# Patient Record
Sex: Male | Born: 1935
Health system: Southern US, Community
[De-identification: ages and names within clinical notes are randomized; demographics above are authoritative.]

## PROBLEM LIST (undated history)

## (undated) DIAGNOSIS — N400 Enlarged prostate without lower urinary tract symptoms: Secondary | ICD-10-CM

## (undated) DIAGNOSIS — K654 Sclerosing mesenteritis: Secondary | ICD-10-CM

## (undated) DIAGNOSIS — E860 Dehydration: Secondary | ICD-10-CM

## (undated) DIAGNOSIS — H33319 Horseshoe tear of retina without detachment, unspecified eye: Secondary | ICD-10-CM

## (undated) DIAGNOSIS — K5790 Diverticulosis of intestine, part unspecified, without perforation or abscess without bleeding: Secondary | ICD-10-CM

## (undated) DIAGNOSIS — I2510 Atherosclerotic heart disease of native coronary artery without angina pectoris: Secondary | ICD-10-CM

## (undated) DIAGNOSIS — I10 Essential (primary) hypertension: Secondary | ICD-10-CM

## (undated) DIAGNOSIS — R413 Other amnesia: Secondary | ICD-10-CM

## (undated) DIAGNOSIS — I252 Old myocardial infarction: Secondary | ICD-10-CM

## (undated) DIAGNOSIS — H9190 Unspecified hearing loss, unspecified ear: Secondary | ICD-10-CM

## (undated) DIAGNOSIS — E785 Hyperlipidemia, unspecified: Secondary | ICD-10-CM

## (undated) DIAGNOSIS — I350 Nonrheumatic aortic (valve) stenosis: Secondary | ICD-10-CM

## (undated) DIAGNOSIS — R7301 Impaired fasting glucose: Secondary | ICD-10-CM

## (undated) HISTORY — DX: Essential (primary) hypertension: I10

## (undated) HISTORY — DX: Sclerosing mesenteritis: K65.4

## (undated) HISTORY — DX: Hyperlipidemia, unspecified: E78.5

## (undated) HISTORY — DX: Unspecified hearing loss, unspecified ear: H91.90

## (undated) HISTORY — DX: Dehydration: E86.0

## (undated) HISTORY — PX: CATARACT EXTRACTION, BILATERAL: SHX1313

## (undated) HISTORY — DX: Diverticulosis of intestine, part unspecified, without perforation or abscess without bleeding: K57.90

## (undated) HISTORY — DX: Old myocardial infarction: I25.2

## (undated) HISTORY — DX: Nonrheumatic aortic (valve) stenosis: I35.0

## (undated) HISTORY — PX: OTHER SURGICAL HISTORY: SHX169

## (undated) HISTORY — DX: Horseshoe tear of retina without detachment, unspecified eye: H33.319

## (undated) HISTORY — DX: Other amnesia: R41.3

## (undated) HISTORY — PX: AORTIC VALVE REPLACEMENT: SHX41

## (undated) HISTORY — PX: TONSILLECTOMY: SUR1361

## (undated) HISTORY — DX: Benign prostatic hyperplasia without lower urinary tract symptoms: N40.0

## (undated) HISTORY — DX: Impaired fasting glucose: R73.01

## (undated) HISTORY — DX: Atherosclerotic heart disease of native coronary artery without angina pectoris: I25.10

---

## 2000-11-30 ENCOUNTER — Ambulatory Visit (HOSPITAL_COMMUNITY): Admission: RE | Admit: 2000-11-30 | Discharge: 2000-11-30 | Payer: Self-pay | Admitting: Internal Medicine

## 2000-12-10 ENCOUNTER — Ambulatory Visit (HOSPITAL_COMMUNITY): Admission: RE | Admit: 2000-12-10 | Discharge: 2000-12-10 | Payer: Self-pay | Admitting: Gastroenterology

## 2004-11-20 ENCOUNTER — Encounter: Admission: RE | Admit: 2004-11-20 | Discharge: 2004-11-20 | Payer: Self-pay | Admitting: Internal Medicine

## 2005-01-27 ENCOUNTER — Encounter: Admission: RE | Admit: 2005-01-27 | Discharge: 2005-01-27 | Payer: Self-pay | Admitting: Internal Medicine

## 2005-11-07 ENCOUNTER — Encounter: Admission: RE | Admit: 2005-11-07 | Discharge: 2005-11-07 | Payer: Self-pay | Admitting: Internal Medicine

## 2008-01-21 DIAGNOSIS — I2510 Atherosclerotic heart disease of native coronary artery without angina pectoris: Secondary | ICD-10-CM

## 2008-01-21 DIAGNOSIS — I350 Nonrheumatic aortic (valve) stenosis: Secondary | ICD-10-CM

## 2008-01-21 HISTORY — DX: Atherosclerotic heart disease of native coronary artery without angina pectoris: I25.10

## 2008-01-21 HISTORY — DX: Nonrheumatic aortic (valve) stenosis: I35.0

## 2008-03-27 ENCOUNTER — Inpatient Hospital Stay (HOSPITAL_COMMUNITY): Admission: EM | Admit: 2008-03-27 | Discharge: 2008-03-28 | Payer: Self-pay | Admitting: Emergency Medicine

## 2008-03-28 ENCOUNTER — Encounter: Payer: Self-pay | Admitting: Surgery

## 2008-03-28 ENCOUNTER — Encounter (INDEPENDENT_AMBULATORY_CARE_PROVIDER_SITE_OTHER): Payer: Self-pay | Admitting: Interventional Cardiology

## 2008-03-28 ENCOUNTER — Ambulatory Visit: Payer: Self-pay | Admitting: Surgery

## 2008-04-04 ENCOUNTER — Ambulatory Visit: Payer: Self-pay | Admitting: Surgery

## 2008-04-06 ENCOUNTER — Inpatient Hospital Stay (HOSPITAL_BASED_OUTPATIENT_CLINIC_OR_DEPARTMENT_OTHER): Admission: RE | Admit: 2008-04-06 | Discharge: 2008-04-06 | Payer: Self-pay | Admitting: Interventional Cardiology

## 2008-04-12 ENCOUNTER — Encounter: Payer: Self-pay | Admitting: Surgery

## 2008-04-12 ENCOUNTER — Inpatient Hospital Stay (HOSPITAL_COMMUNITY): Admission: RE | Admit: 2008-04-12 | Discharge: 2008-04-18 | Payer: Self-pay | Admitting: Surgery

## 2008-04-12 ENCOUNTER — Ambulatory Visit: Payer: Self-pay | Admitting: Surgery

## 2008-05-12 ENCOUNTER — Ambulatory Visit: Payer: Self-pay | Admitting: Surgery

## 2008-05-12 ENCOUNTER — Encounter: Admission: RE | Admit: 2008-05-12 | Discharge: 2008-05-12 | Payer: Self-pay | Admitting: Surgery

## 2008-05-23 ENCOUNTER — Encounter: Admission: RE | Admit: 2008-05-23 | Discharge: 2008-05-23 | Payer: Self-pay | Admitting: Surgery

## 2008-05-23 ENCOUNTER — Ambulatory Visit: Payer: Self-pay | Admitting: Surgery

## 2008-06-12 ENCOUNTER — Encounter (HOSPITAL_COMMUNITY): Admission: RE | Admit: 2008-06-12 | Discharge: 2008-08-20 | Payer: Self-pay | Admitting: Interventional Cardiology

## 2008-06-20 ENCOUNTER — Encounter: Admission: RE | Admit: 2008-06-20 | Discharge: 2008-06-20 | Payer: Self-pay | Admitting: Surgery

## 2008-06-20 ENCOUNTER — Ambulatory Visit: Payer: Self-pay | Admitting: Surgery

## 2010-02-10 ENCOUNTER — Encounter: Payer: Self-pay | Admitting: Surgery

## 2010-05-02 LAB — APTT
aPTT: 30 seconds (ref 24–37)
aPTT: 44 seconds — ABNORMAL HIGH (ref 24–37)

## 2010-05-02 LAB — HEMOGLOBIN A1C
Hgb A1c MFr Bld: 5.4 % (ref 4.6–6.1)
Mean Plasma Glucose: 108 mg/dL

## 2010-05-02 LAB — PREPARE PLATELETS

## 2010-05-02 LAB — POCT I-STAT 3, ART BLOOD GAS (G3+)
Acid-Base Excess: 1 mmol/L (ref 0.0–2.0)
Acid-base deficit: 4 mmol/L — ABNORMAL HIGH (ref 0.0–2.0)
Acid-base deficit: 4 mmol/L — ABNORMAL HIGH (ref 0.0–2.0)
Bicarbonate: 20.2 mEq/L (ref 20.0–24.0)
Bicarbonate: 20.9 mEq/L (ref 20.0–24.0)
Bicarbonate: 23.6 mEq/L (ref 20.0–24.0)
Bicarbonate: 24.6 mEq/L — ABNORMAL HIGH (ref 20.0–24.0)
O2 Saturation: 100 %
O2 Saturation: 96 %
O2 Saturation: 98 %
Patient temperature: 36.2
TCO2: 25 mmol/L (ref 0–100)
TCO2: 26 mmol/L (ref 0–100)
TCO2: 26 mmol/L (ref 0–100)
pCO2 arterial: 33 mmHg — ABNORMAL LOW (ref 35.0–45.0)
pCO2 arterial: 34.1 mmHg — ABNORMAL LOW (ref 35.0–45.0)
pCO2 arterial: 36.6 mmHg (ref 35.0–45.0)
pCO2 arterial: 39.8 mmHg (ref 35.0–45.0)
pCO2 arterial: 40.3 mmHg (ref 35.0–45.0)
pH, Arterial: 7.315 — ABNORMAL LOW (ref 7.350–7.450)
pH, Arterial: 7.368 (ref 7.350–7.450)
pH, Arterial: 7.381 (ref 7.350–7.450)
pH, Arterial: 7.398 (ref 7.350–7.450)
pH, Arterial: 7.48 — ABNORMAL HIGH (ref 7.350–7.450)
pO2, Arterial: 406 mmHg — ABNORMAL HIGH (ref 80.0–100.0)
pO2, Arterial: 79 mmHg — ABNORMAL LOW (ref 80.0–100.0)
pO2, Arterial: 84 mmHg (ref 80.0–100.0)
pO2, Arterial: 96 mmHg (ref 80.0–100.0)

## 2010-05-02 LAB — CBC
HCT: 24.1 % — ABNORMAL LOW (ref 39.0–52.0)
HCT: 27.1 % — ABNORMAL LOW (ref 39.0–52.0)
HCT: 30.6 % — ABNORMAL LOW (ref 39.0–52.0)
HCT: 38.4 % — ABNORMAL LOW (ref 39.0–52.0)
HCT: 40 % (ref 39.0–52.0)
Hemoglobin: 10.5 g/dL — ABNORMAL LOW (ref 13.0–17.0)
Hemoglobin: 13.5 g/dL (ref 13.0–17.0)
Hemoglobin: 8.5 g/dL — ABNORMAL LOW (ref 13.0–17.0)
Hemoglobin: 9.6 g/dL — ABNORMAL LOW (ref 13.0–17.0)
Hemoglobin: 9.7 g/dL — ABNORMAL LOW (ref 13.0–17.0)
MCHC: 34.3 g/dL (ref 30.0–36.0)
MCHC: 35 g/dL (ref 30.0–36.0)
MCHC: 35.2 g/dL (ref 30.0–36.0)
MCHC: 35.6 g/dL (ref 30.0–36.0)
MCV: 94.1 fL (ref 78.0–100.0)
MCV: 95 fL (ref 78.0–100.0)
MCV: 95.2 fL (ref 78.0–100.0)
Platelets: 120 10*3/uL — ABNORMAL LOW (ref 150–400)
Platelets: 180 10*3/uL (ref 150–400)
Platelets: 202 10*3/uL (ref 150–400)
RBC: 2.86 MIL/uL — ABNORMAL LOW (ref 4.22–5.81)
RBC: 2.88 MIL/uL — ABNORMAL LOW (ref 4.22–5.81)
RBC: 4.04 MIL/uL — ABNORMAL LOW (ref 4.22–5.81)
RDW: 13 % (ref 11.5–15.5)
RDW: 13.2 % (ref 11.5–15.5)
RDW: 12.8 % (ref 11.5–15.5)
WBC: 10.5 10*3/uL (ref 4.0–10.5)
WBC: 13.8 10*3/uL — ABNORMAL HIGH (ref 4.0–10.5)
WBC: 7.5 10*3/uL (ref 4.0–10.5)
WBC: 7.8 10*3/uL (ref 4.0–10.5)

## 2010-05-02 LAB — BASIC METABOLIC PANEL
BUN: 18 mg/dL (ref 6–23)
BUN: 16 mg/dL (ref 6–23)
CO2: 25 mEq/L (ref 19–32)
CO2: 26 mEq/L (ref 19–32)
CO2: 24 mEq/L (ref 19–32)
CO2: 26 mEq/L (ref 19–32)
Calcium: 8.4 mg/dL (ref 8.4–10.5)
Calcium: 8.3 mg/dL — ABNORMAL LOW (ref 8.4–10.5)
Chloride: 110 mEq/L (ref 96–112)
Chloride: 112 mEq/L (ref 96–112)
Chloride: 113 mEq/L — ABNORMAL HIGH (ref 96–112)
Chloride: 107 mEq/L (ref 96–112)
Chloride: 109 mEq/L (ref 96–112)
Creatinine, Ser: 1 mg/dL (ref 0.4–1.5)
GFR calc Af Amer: >60 mL/min (ref >60)
GFR calc Af Amer: >60 mL/min (ref >60)
GFR calc Af Amer: >60 mL/min (ref >60)
GFR calc non Af Amer: >60 mL/min (ref >60)
Glucose, Bld: 130 mg/dL — ABNORMAL HIGH (ref 70–99)
Glucose, Bld: 158 mg/dL — ABNORMAL HIGH (ref 70–99)
Glucose, Bld: 109 mg/dL — ABNORMAL HIGH (ref 70–99)
Potassium: 3.4 mEq/L — ABNORMAL LOW (ref 3.5–5.1)
Potassium: 4 mEq/L (ref 3.5–5.1)
Potassium: 3.7 mEq/L (ref 3.5–5.1)
Sodium: 138 mEq/L (ref 135–145)
Sodium: 141 mEq/L (ref 135–145)
Sodium: 141 mEq/L (ref 135–145)
Sodium: 141 mEq/L (ref 135–145)

## 2010-05-02 LAB — COMPREHENSIVE METABOLIC PANEL
ALT: 15 U/L (ref 0–53)
AST: 15 U/L (ref 0–37)
Albumin: 3.5 g/dL (ref 3.5–5.2)
Alkaline Phosphatase: 53 U/L (ref 39–117)
BUN: 16 mg/dL (ref 6–23)
CO2: 21 mEq/L (ref 19–32)
Calcium: 9.2 mg/dL (ref 8.4–10.5)
Chloride: 110 mEq/L (ref 96–112)
Creatinine, Ser: 0.82 mg/dL (ref 0.4–1.5)
GFR calc Af Amer: >60 mL/min (ref >60)
GFR calc non Af Amer: >60 mL/min (ref >60)
Glucose, Bld: 82 mg/dL (ref 70–99)
Potassium: 4.4 mEq/L (ref 3.5–5.1)
Sodium: 139 mEq/L (ref 135–145)
Total Bilirubin: 0.8 mg/dL (ref 0.3–1.2)
Total Protein: 5.8 g/dL — ABNORMAL LOW (ref 6.0–8.3)

## 2010-05-02 LAB — DIFFERENTIAL
Lymphocytes Relative: 18 % (ref 12–46)
Lymphs Abs: 1.5 10*3/uL (ref 0.7–4.0)
Monocytes Absolute: 0.6 10*3/uL (ref 0.1–1.0)
Monocytes Relative: 7 % (ref 3–12)
Neutro Abs: 6.1 10*3/uL (ref 1.7–7.7)

## 2010-05-02 LAB — CARDIAC PANEL(CRET KIN+CKTOT+MB+TROPI)
Relative Index: INVALID (ref 0.0–2.5)
Relative Index: INVALID (ref 0.0–2.5)
Total CK: 125 U/L (ref 7–232)
Total CK: 81 U/L (ref 7–232)

## 2010-05-02 LAB — GLUCOSE, CAPILLARY
Glucose-Capillary: 146 mg/dL — ABNORMAL HIGH (ref 70–99)
Glucose-Capillary: 42 mg/dL — ABNORMAL LOW (ref 70–99)
Glucose-Capillary: 53 mg/dL — ABNORMAL LOW (ref 70–99)
Glucose-Capillary: 61 mg/dL — ABNORMAL LOW (ref 70–99)
Glucose-Capillary: 69 mg/dL — ABNORMAL LOW (ref 70–99)
Glucose-Capillary: 97 mg/dL (ref 70–99)
Glucose-Capillary: 107 mg/dL — ABNORMAL HIGH (ref 70–99)

## 2010-05-02 LAB — POCT I-STAT 4, (NA,K, GLUC, HGB,HCT)
Glucose, Bld: 104 mg/dL — ABNORMAL HIGH (ref 70–99)
Glucose, Bld: 106 mg/dL — ABNORMAL HIGH (ref 70–99)
Glucose, Bld: 111 mg/dL — ABNORMAL HIGH (ref 70–99)
Glucose, Bld: 120 mg/dL — ABNORMAL HIGH (ref 70–99)
Glucose, Bld: 88 mg/dL (ref 70–99)
HCT: 25 % — ABNORMAL LOW (ref 39.0–52.0)
HCT: 25 % — ABNORMAL LOW (ref 39.0–52.0)
HCT: 25 % — ABNORMAL LOW (ref 39.0–52.0)
HCT: 25 % — ABNORMAL LOW (ref 39.0–52.0)
HCT: 25 % — ABNORMAL LOW (ref 39.0–52.0)
HCT: 27 % — ABNORMAL LOW (ref 39.0–52.0)
HCT: 29 % — ABNORMAL LOW (ref 39.0–52.0)
HCT: 34 % — ABNORMAL LOW (ref 39.0–52.0)
Hemoglobin: 11.6 g/dL — ABNORMAL LOW (ref 13.0–17.0)
Hemoglobin: 8.5 g/dL — ABNORMAL LOW (ref 13.0–17.0)
Hemoglobin: 8.5 g/dL — ABNORMAL LOW (ref 13.0–17.0)
Hemoglobin: 8.5 g/dL — ABNORMAL LOW (ref 13.0–17.0)
Hemoglobin: 9.2 g/dL — ABNORMAL LOW (ref 13.0–17.0)
Hemoglobin: 9.9 g/dL — ABNORMAL LOW (ref 13.0–17.0)
Potassium: 3.7 mEq/L (ref 3.5–5.1)
Potassium: 3.9 mEq/L (ref 3.5–5.1)
Potassium: 4.7 mEq/L (ref 3.5–5.1)
Potassium: 5 mEq/L (ref 3.5–5.1)
Potassium: 5.2 mEq/L — ABNORMAL HIGH (ref 3.5–5.1)
Sodium: 135 mEq/L (ref 135–145)
Sodium: 139 mEq/L (ref 135–145)
Sodium: 142 mEq/L (ref 135–145)

## 2010-05-02 LAB — POCT I-STAT 3, VENOUS BLOOD GAS (G3P V)
Acid-base deficit: 1 mmol/L (ref 0.0–2.0)
Acid-base deficit: 4 mmol/L — ABNORMAL HIGH (ref 0.0–2.0)
Bicarbonate: 22.1 mEq/L (ref 20.0–24.0)
Bicarbonate: 22.3 mEq/L (ref 20.0–24.0)
Bicarbonate: 23.9 mEq/L (ref 20.0–24.0)
O2 Saturation: 63 %
O2 Saturation: 94 %
TCO2: 23 mmol/L (ref 0–100)
TCO2: 25 mmol/L (ref 0–100)
pCO2, Ven: 39.2 mmHg — ABNORMAL LOW (ref 45.0–50.0)
pCO2, Ven: 40.5 mmHg — ABNORMAL LOW (ref 45.0–50.0)
pCO2, Ven: 44.6 mmHg — ABNORMAL LOW (ref 45.0–50.0)
pH, Ven: 7.378 — ABNORMAL HIGH (ref 7.250–7.300)
pO2, Ven: 33 mmHg (ref 30.0–45.0)
pO2, Ven: 74 mmHg — ABNORMAL HIGH (ref 30.0–45.0)
pO2, Ven: 84 mmHg — ABNORMAL HIGH (ref 30.0–45.0)

## 2010-05-02 LAB — URINALYSIS, ROUTINE W REFLEX MICROSCOPIC
Bilirubin Urine: NEGATIVE
Glucose, UA: NEGATIVE mg/dL
Glucose, UA: NEGATIVE mg/dL
Hgb urine dipstick: NEGATIVE
Ketones, ur: NEGATIVE mg/dL
Nitrite: NEGATIVE
Protein, ur: NEGATIVE mg/dL
Protein, ur: 30 mg/dL — AB
Specific Gravity, Urine: 1.023 (ref 1.005–1.030)
Urobilinogen, UA: 1 mg/dL (ref 0.0–1.0)
pH: 6.5 (ref 5.0–8.0)

## 2010-05-02 LAB — CK TOTAL AND CKMB (NOT AT ARMC)
Relative Index: INVALID (ref 0.0–2.5)
Total CK: 86 U/L (ref 7–232)

## 2010-05-02 LAB — POCT I-STAT, CHEM 8
Creatinine, Ser: 1 mg/dL (ref 0.4–1.5)
HCT: 25 % — ABNORMAL LOW (ref 39.0–52.0)
Hemoglobin: 8.5 g/dL — ABNORMAL LOW (ref 13.0–17.0)
Sodium: 141 mEq/L (ref 135–145)
TCO2: 21 mmol/L (ref 0–100)

## 2010-05-02 LAB — URINE CULTURE: Culture: NO GROWTH

## 2010-05-02 LAB — BLOOD GAS, ARTERIAL
Acid-base deficit: 0.5 mmol/L (ref 0.0–2.0)
Bicarbonate: 23.4 mEq/L (ref 20.0–24.0)
Drawn by: 206361
FIO2: 0.21 %
O2 Saturation: 98 %
Patient temperature: 98.6
TCO2: 24.5 mmol/L (ref 0–100)
pCO2 arterial: 36.5 mmHg (ref 35.0–45.0)
pH, Arterial: 7.423 (ref 7.350–7.450)
pO2, Arterial: 92.5 mmHg (ref 80.0–100.0)

## 2010-05-02 LAB — URINE MICROSCOPIC-ADD ON

## 2010-05-02 LAB — PROTIME-INR
INR: 1.2 (ref 0.00–1.49)
INR: 2 — ABNORMAL HIGH (ref 0.00–1.49)
Prothrombin Time: 15.7 seconds — ABNORMAL HIGH (ref 11.6–15.2)

## 2010-05-02 LAB — TYPE AND SCREEN
ABO/RH(D): A POS
Antibody Screen: NEGATIVE

## 2010-05-02 LAB — CREATININE, SERUM
Creatinine, Ser: 1.01 mg/dL (ref 0.4–1.5)
GFR calc Af Amer: >60 mL/min (ref >60)
GFR calc non Af Amer: >60 mL/min (ref >60)

## 2010-05-02 LAB — POCT CARDIAC MARKERS
CKMB, poc: <1 ng/mL — ABNORMAL LOW (ref 1.0–8.0)
Troponin i, poc: <0.05 ng/mL (ref 0.00–0.09)

## 2010-05-02 LAB — ABO/RH: ABO/RH(D): A POS

## 2010-05-02 LAB — PREPARE RBC (CROSSMATCH)

## 2010-06-04 NOTE — H&P (Signed)
[name], [name]              ACCOUNT NO.:  [id number]   MEDICAL RECORD NO.:  [id number]          PATIENT TYPE:  INP   LOCATION:  3729                         FACILITY:  [facility]   PHYSICIAN:  Kela Millin, M.D.DATE OF BIRTH:  1926/12/29   DATE OF ADMISSION:  03/27/2008  DATE OF DISCHARGE:                              HISTORY & PHYSICAL   PRIMARY CARE PHYSICIAN:  Dr. Kirby Funk.   CHIEF COMPLAINT:  Syncope.   HISTORY OF PRESENT ILLNESS:  The patient is a 77 year old white male  with past medical history significant for valvular heart disease  followed by Dr. Eldridge Dace and hypertension who presents with the above  complaints.  He states that he had been at the [facility] and exercised for  about an hour, and then decided that he was going to walk over to a  garage which was about 2 miles away.  He had just left the [facility] and was  walking for about 5 minutes when he began feeling dizzy, and then put  the bag down that he was carrying with him and the next thing he knew he  was waking up on the ground.  He had passed out, and it is unclear  exactly for how long he was unconscious, but he suggests a few minutes.  He reports that one of the motorists saw him after he fell to the ground  and came to his aid, and a police also came by and when he came to they  were checking his pulse and they mentioned that his blood pressure was  in the 150s systolic at the scene.  His wife reports and the patient  confirms that he was incontinent of urine.  No reports of jerking or  confusion following the episode.  The patient reports that he had not  had enough water to drink after his exercise.  He denies chest pain,  palpitations, headaches, focal weakness, blurred vision, diarrhea,  melena, nausea or vomiting and no hematochezia.  He does not know  exactly which of his valves is diseased, but admits that he also has a  longstanding murmur.   He was seen in the ED, and a chest x-ray was done which  did not show any  significant findings.  His hemoglobin of 14.2 with a hematocrit of 40.  Point of care markers negative x1.  BUN of 16 with a creatinine of 1.03,  his glucose 107.  His EKG showed normal sinus rhythm at a rate of 79  with no acute ischemic changes.  He is admitted for further evaluation  and management.   PAST MEDICAL HISTORY:  As above.   MEDICATIONS:  1. Lisinopril.  2. Aspirin 325 mg.  3. Pravastatin.  He does not know the doses of the medications.   ALLERGIES:  NKDA.   SOCIAL HISTORY:  He smokes a pipe.  He denies alcohol.   FAMILY HISTORY:  His mother had mitral valve disease and had some  valvular surgery.   REVIEW OF SYSTEMS:  As per HPI; other review of systems negative.   PHYSICAL EXAMINATION:  GENERAL:  The  patient is an elderly white male.  He is alert and appropriate in no apparent distress.  VITAL SIGNS:  Temperature is 97.5 with a blood pressure of 121/81, pulse  of 81, respiratory rate of 23, O2 sat of 100%.  HEENT:  PERRL, EOMI, sclerae anicteric, slightly dry mucous membranes  and no oral exudates.  NECK:  Supple, no adenopathy, no thyromegaly, and no JVD.  LUNGS:  Clear to auscultation bilaterally.  No crackles or wheezes.  CARDIOVASCULAR:  Regular rate and rhythm.  Normal S1, S2, holosystolic  murmur present.  ABDOMEN:  Soft, bowel sounds present, nontender, nondistended.  No  organomegaly and no masses palpable.  EXTREMITIES:  No cyanosis and no edema.  NEUROLOGICAL:  He is alert and oriented.  Cranial nerves II-XII grossly  intact.  Strength is 5/5 and symmetric.  Sensory is grossly intact.   LABORATORY DATA:  As per HPI.  Also the white cell count is 8.3,  hemoglobin 14.2, hematocrit 40, platelet count 202.  Point of care  markers negative x1.  Sodium is 140, potassium 3.7, chloride 107, CO2 of  24, glucose 109, BUN is 16, creatinine 1.03.  Calcium is 9.4.  Urinalysis is negative for infection.   ASSESSMENT AND PLAN:  1. Syncope -  cardiac versus orthostatic syncope versus neurological.      Will check orthostatic vital signs, obtain serial cardiac enzymes,      monitor on telemetry, serial cardiac enzymes .  Consult cardiology      pending cardiac enzymes.  Will also obtain a 2-D echocardiogram -      as noted above.  He has a history of valvular heart disease with a      longstanding murmur.  Obtain outpatient records.  Will also obtain      a computed tomography scan of his head and carotid Doppler      ultrasound to further evaluate.  Follow and consider an EEG pending      these studies as is noted above. Was incontinent of urine at the      scene, although no other history given consistent with seizure      activity.  2. Hypertension - follow and resume outpatient medications as      clinically appropriate.  3. History of valvular heart disease - as above.      Kela Millin, M.D.  Electronically Signed     ACV/MEDQ  D:  03/28/2008  T:  03/28/2008  Job:  [id number]   cc:   Thora Lance, M.D.  Corky Crafts, MD

## 2010-06-04 NOTE — Consult Note (Signed)
[name], [name]              ACCOUNT NO.:  [id number]   MEDICAL RECORD NO.:  [id number]          PATIENT TYPE:  INP   LOCATION:  3729                         FACILITY:  [facility]   PHYSICIAN:  Corky Crafts, MDDATE OF BIRTH:  March 10, 1926   DATE OF CONSULTATION:  03/28/2008  DATE OF DISCHARGE:  03/28/2008                                 CONSULTATION   REFERRING PHYSICIAN:  Thora Lance, MD   REASON FOR CONSULTATION:  Syncope and aortic stenosis.   HISTORY OF PRESENT ILLNESS:  The patient is a 77 year old man with known  severe aortic stenosis.  He has been followed conservatively as he has  been asymptomatic.  Yesterday, he was walking up a hill after he had  already exercised for about an hour.  He became lightheaded and the next  thing he remembers that people were standing over him and he was lying  on the ground.  He did not hurt himself during the fall.  He did not  have any chest pain.  He does not recall passing out any time recently  before this.  There were no reports of any seizure-type activity.  He  was seen in the emergency department and he was not anemic.  His renal  function was normal.  His blood sugar was 107.  His chest x-ray was  nonacute.   PAST MEDICAL HISTORY:  1. Aortic stenosis.  2. Hypertension.  3. Prior tobacco abuse.   MEDICATIONS:  1. Lisinopril/HCTZ 20/12.5 mg a day.  2. Aspirin 325 mg a day.  3. Pravastatin.   ALLERGIES:  No known drug allergies.   SOCIAL HISTORY:  He does smoke a pipe.  He does not use alcohol.   FAMILY HISTORY:  His mother had mitral valve disease and had a previous  valve surgery.   REVIEW OF SYSTEMS:  Syncope as noted above.  No chest pain.  No  shortness of breath.  No rash.  No bleeding problems.  All other systems  negative.   PHYSICAL EXAMINATION:  VITAL SIGNS:  Blood pressure in the emergency  room was 121/81, heart rate of 81, and oxygen saturation of 100%.  GENERAL:  He is awake and alert, no  apparent distress.  HEAD:  Normocephalic and atraumatic.  NECK:  No JVD.  CARDIOVASCULAR:  Regular rate and rhythm.  S1 and S2.  A 3/6 harsh  systolic murmur.  LUNGS:  Clear to auscultation bilaterally.  ABDOMEN:  Soft, nontender, and nondistended.  EXTREMITIES:  No edema.  NEURO:  No focal deficits.  SKIN:  No rash.  BACK:  Unremarkable.   LABORATORY DATA:  Lab work shows CK of 125, MB of 1.8, and troponin of  0.05 on the most recent check.  Earlier cardiac markers show a CK of 81,  MB of 2.3, and troponin of 0.15.  ECG shows normal sinus rhythm with  evidence of an LVH and associated ST-T wave changes laterally.   MEDICAL DECISION MAKING:  A 81-year with severe aortic stenosis.   PLAN:  1. We will repeat echocardiogram.  It has been about 4-5 months since  his last echo.  I think it is time for him to have his valve      replaced.  I have spoken to Dr. Valentina Lucks as well as Dr. [name]      regarding this.  Ideally, the patient needs to stay in the hospital      and have this catheterization followed by valve replacement.  He      really does not want to stay in the hospital.  He is willing to      come back next week to have the testing, but he does not want to      stay here at this time.  He assures me that he will not do any      exercise or any strenuous activities.  I told him that such      activities would be prohibited because it could lead to more      syncope.  He is agreeable to this.  We will also decrease his      lisinopril/HCTZ to avoid causing any hypotension.  2. Hypertension, well controlled.  We will decrease lisinopril/HCTZ as      outlined above.  3. He is to stop smoking.  4. We will plan for cardiac cath next week followed by surgery.      Corky Crafts, MD  Electronically Signed     JSV/MEDQ  D:  03/28/2008  T:  03/29/2008  Job:  [id number]

## 2010-06-04 NOTE — Op Note (Signed)
[name], [name]              ACCOUNT NO.:  [id number]   MEDICAL RECORD NO.:  [id number]          PATIENT TYPE:  INP   LOCATION:  2310                         FACILITY:  [facility]   PHYSICIAN:  [name], M.D.     DATE OF BIRTH:  04/11/1926   DATE OF PROCEDURE:  04/12/2008  DATE OF DISCHARGE:                               OPERATIVE REPORT   PREOPERATIVE DIAGNOSES:  1. Severe aortic stenosis.  2. Two-vessel coronary artery disease.   POSTOPERATIVE DIAGNOSES:  1. Severe aortic stenosis.  2. Two-vessel coronary artery disease.   OPERATIVE PROCEDURE:  1. Median sternotomy, extracorporeal circulation, coronary artery      bypass graft x2 using a saphenous vein graft to the obtuse marginal      branch and left circumflex coronary artery and a left internal      mammary artery graft to the left anterior descending coronary      artery.  2. Aortic valve replacement using a 23-mm Edwards pericardial valve.  3. Endoscopic vein harvesting from the right leg.   ATTENDING SURGEON:  [name], M.D.   ASSISTANT:  Salvatore Decent. Dorris Fetch, M.D.   SECOND ASSISTANT:  Rowe Clack, PA-C   ANESTHESIA:  General endotracheal.   CLINICAL HISTORY:  This patient is a 77 year old gentleman with a known  history of severe aortic stenosis who has been followed by Dr. Eldridge Dace  for the past few years.  He was asymptomatic until March 27, 2008, when  he had a syncopal episode while walking.  He is ruled out for myocardial  infarction.  An echocardiogram on March 28, 2008, showed severe aortic  stenosis with an aortic valve area of 0.45 sq cm with a peak gradient of  92 and a mean gradient of 52.  He underwent cardiac catheterization,  which showed 2-vessel coronary artery disease.  The left circumflex had  about 70-80% of a large marginal branch.  The LAD had about 50% proximal  stenosis and 80-90% mid vessel stenosis with the distal LAD wrapping  around the apex.  Left ventricular function by  echocardiogram is well  preserved.  The left ventriculogram was not performed.  He had normal  right and left heart pressures.  After review of the catheterization and  echocardiogram and examination of the patient, it was felt that aortic  valve replacement and coronary artery bypass surgery was the best  treatment.  I discussed the operative procedure with the patient and his  wife including alternatives for valve replacement.  I felt a tissue  valve will be his best option, he is 77 year old and he was in agreement  with that.  I discussed benefits and risk of surgery including not  limited to bleeding, blood transfusion, infection, stroke, myocardial  infarction, graft failure, heart block requiring permanent pacemaker.  He understood all of this and agreed to proceed.   OPERATIVE PROCEDURE:  The patient was taken to the operative room and  placed on the table in supine position.  After induction of general  endotracheal anesthesia, a Foley catheter was placed in bladder using  sterile technique.  Preoperative  intravenous antibiotics were given.  Then, the chest, abdomen, and both lower extremities were prepped and  draped in usual sterile manner.  A transesophageal echocardiogram was  performed by Anesthesiology.  This showed severe calcific aortic  stenosis.  There was no significant aortic insufficiency.  There was no  mitral regurgitation.  Left ventricular function appeared well preserved  with moderate left ventricular hypertrophy.  Right ventricular function  was preserved.   Then, the chest was opened through a median sternotomy incision and the  pericardium opened in midline.  Examination of the heart showed good  ventricular contractility.  The ascending aorta had no palpable plaques  in it.   Then, the left internal mammary artery was harvested from the chest wall  as a pedicle graft.  This was a medium caliber vessel with excellent  blood flow through it.  At the same  time, a segment of greater saphenous  vein was harvested from the right leg using endoscopic vein harvest  technique.  This vein was of medium size and fair quality.  The walls  were somewhat thickened.   Then, the patient was heparinized and when an adequate activated  clotting time was achieved, the distal ascending aorta was cannulated  using a 20-French aortic cannula for arterial inflow.  Venous outflow  was achieved using a 2-stage venous cannula through the right atrial  appendage.  An antegrade cardioplegia and vent cannula was inserted in  the aortic root.   A left ventricular vent was placed through the right superior pulmonary  vein, and a retrograde cardioplegic cannula was placed through the right  atrium into the coronary sinus.   The patient was placed on cardiopulmonary bypass and distal coronaries  identified.  The LAD was a large graftable vessel, it was diffusely  disease.  The obtuse marginal branch was a moderate-sized vessel with no  significant distal disease in it.  The posterior descending branch of  the left circumflex was intramyocardial and not visualized.  The right  coronary was a small nondominant vessel on catheterization, was not  visualized.   Then, the aorta was cross-clamped and 1000 mL of cold blood antegrade  cardioplegia was administered in the aortic root with quick arrest of  the heart.  Systemic hypothermia to 20 degrees centigrade and topical  hypothermia with iced saline was used.  Temperature probe was placed in  the septum insulating pad in the pericardium.  Then, the first distal  anastomosis was performed to the obtuse marginal branch.  The internal  diameter was about 1.75 mm.  The conduit used was the same segment of  greater saphenous vein and the anastomosis was performed in an end-to-  side manner using continuous 7-0 Prolene suture.  Flow was noted through  the graft and was excellent.   Second distal anastomosis was performed  to the distal LAD.  The internal  diameter was 1.75-2 mm.  The conduit used was the left internal mammary  graft and is brought through an opening in the left pericardium and  anterior to the phrenic nerve.  It was anastomosed through the LAD in an  end-to-side manner using continuous 8-0 Prolene suture.  The pedicle was  sutured to the epicardium with 6-0 Prolene sutures.  Then, another dose  of cardioplegia was given in the aortic root.   The aortic root was then opened transversely just above the sinotubular  junction.  Examination of the aortic valve showed there were 3 leaflets.  They were very  calcified and completely pliable.  The native valve was  excised.  The annulus was decalcified with rongeurs.  Care was taken to  remove all particulate debris.  The right and left coronary ostia were  identified.  Then, the left ventricle and aortic root irrigated with  iced-saline solution.  While the aortic valve replacement was being  performed, cold blood antegrade cardioplegia was given at 20-minute  intervals to maintain myocardial temperature around 10 degrees  centigrade.  Then, the annulus was sized, and a 23-mm Edwards  pericardial Magna-Ease valve was chosen.  This had model #3300 TSX  serial [id number].  Then, a series of pledgeted 2-0 Ethibond horizontal  mattress sutures were placed around the annulus with pledgets in the  subannular position.  The sutures were then placed to the sewing ring,  and the valve lowered into place.  The sutures were tied sequentially.  The valve seated nicely.  Then, the patient was rewarmed to 37-degrees  centigrade.  The aorta was closed in 2 layers using continuous 4-0  Prolene suture.  The aortotomy was coated with Coseal for hemostasis.  With crossclamp in place, a single proximal vein graft anastomosis was  performed to the aortic root in an end-to-side manner using continuous 6-  0 Prolene suture.  Then, the left side of the heart was de-aired.   The  head was placed in the Trendelenburg position and the clamp was removed  from the mammary pedicle.  There was rapid warming of the ventricular  septum and return of spontaneous ventricular fibrillation.  The cross  clamp was removed with a time of 101 minutes.  The patient was then  defibrillated into sinus rhythm.   The proximal and distal anastomoses appeared hemostatic and allowed the  graft satisfactorily and graft markers were placed around the proximal  anastomosis.  Two temporary right ventricular and right atrial pacing  wires were placed and brought out through the skin.   When the patient rewarmed at the 37-degree centigrade, he was weaned  from cardiopulmonary bypass on low-dose dopamine.  He had moderate  amount of intracardiac air and therefore we slowly weaned him from  cardiopulmonary bypass to rule out this area to be removed through the  vent.  After weaning from bypass, TE showed preserved left ventricular  function with a normal functioning prosthetic aortic valve with no  evidence of perivalvular leak or regurgitation.  There was no mitral  regurgitation.  Then, protamine was given, and the venous and aortic  clamp was removed without difficulty.  Hemostasis was achieved.  The  patient was given 1 unit of platelets since he was coagulopathic from  the start of the procedure.  Then, 3 chest tubes were placed with the  tube in the posterior pericardium, 1 in the right pleural space, and 1  in the anterior mediastinum.  The pericardium is loosely reapproximated  over the heart.  The sternum was closed with #6 stainless steel wires.  The fascia was closed with continuous #1 Vicryl suture.  Subcutaneous  tissue was closed with continuous 2-0 Vicryl, and the skin with 3-0  Vicryl subcuticular skin closure.  The lower extremity vein harvest site  was closed in layers in a similar manner.  The sponge, needle, and  instrument counts were correct according to the scrub  nurse.  Dry,  sterile dressings were applied over the incisions around the chest  tubes, which were hooked to Pleur-Evac suction.  The patient remained  hemodynamically stable and was  transferred to the SICU in guarded but  stable condition.      [name], M.D.  Electronically Signed     BB/MEDQ  D:  04/12/2008  T:  04/13/2008  Job:  [id number]   cc:   Corky Crafts, MD

## 2010-06-04 NOTE — Assessment & Plan Note (Signed)
OFFICE VISIT   [name], [name]  DOB:  Oct 01, 1926                                        June 20, 2008  CHART #:  [id number]   The patient returned to the office today for followup of a left pleural  effusion status post coronary bypass surgery and aortic valve  replacement on April 12, 2008.  He has been feeling well and is in  cardiac rehab.  He has had no shortness of breath or chest pain. His lab  work continues to improve.   PHYSICAL EXAMINATION:  VITAL SIGNS:  Today, blood pressure is 111/64,  pulse is 54 and regular, and respiratory rate is 16, unlabored.  Oxygen  saturation on room air is 98%.  GENERAL:  He looks well.  CARDIAC:  A regular rate and rhythm with normal valve sounds.  LUNGS:  Slight decreased breath sounds at the left base.  CHEST:  Incision well healed and sternum is stable.  EXTREMITIES:  There is no peripheral edema.   Followup chest x-ray today shows a continued decrease in the size of  left pleural effusion.  There is still a small amount of left pleural  effusion present.  Lungs are otherwise clear.   IMPRESSION:  Overall, the patient is recovering very well following his  surgery.  I would expect the small left pleural effusion to resolve on  its own.  I do not think any further intervention is warranted at this  time.  He will continue cardiac rehab and will continue to follow up  with Dr. Eldridge Dace for his cardiac care.   [name], M.D.  Electronically Signed   BB/MEDQ  D:  06/20/2008  T:  06/20/2008  Job:  [id number]

## 2010-06-04 NOTE — Discharge Summary (Signed)
Allen, Bowman              ACCOUNT NO.:  [id number]   MEDICAL RECORD NO.:  [id number]          PATIENT TYPE:  INP   LOCATION:  3729                         FACILITY:  [facility]   PHYSICIAN:  Thora Lance, M.D.  DATE OF BIRTH:  Aug 07, 1926   DATE OF ADMISSION:  03/27/2008  DATE OF DISCHARGE:  03/28/2008                               DISCHARGE SUMMARY   REASON FOR ADMISSION:  [name] presented with an episode of syncope  while walking after his workout.  Significant finding as per history and  physical.   LABORATORIES AT ADMISSION:  WBC 8.3, hemoglobin 14.2, platelet 202.  Sodium 140, potassium 3.7, chloride 107, bicarbonate 24, glucose 109,  BUN 16, creatinine 1.0, calcium 9.4, CK 86, CK-MB 2.3, troponin-I 0.13.  Urinalysis too numerous to count rbc's.   HOSPITAL COURSE:  The patient was admitted with an episode of exertional  syncope.  This is felt related to some hypotension and volume depletion  in the setting of aortic stenosis.  The patient was treated with IV  fluids.  His blood pressure medicines were held at admission.  He was  seen by Dr. Eldridge Dace of the Cardiology service.  A 2D echocardiogram was  completed on March 28, 2008, and showed normal LV function and an EF of  50% and severe aortic valve stenosis.  The patient was discharged to  home and will be followed up as an outpatient with Dr. Eldridge Dace and Dr.  Laneta Simmers for consideration of aortic valve replacement.  His lisinopril  HCT dose was cut in half.   DISCHARGE DIAGNOSES:  1. Syncope.  2. Aortic stenosis.  3. Hypertension.   PROCEDURES:  A 2D echocardiogram.   DISCHARGE MEDICATIONS:  1. Lisinopril and hydrochlorothiazide 25/12.5 mg one-half daily.  2. Aspirin 325 mg daily.  3. Pravastatin.   DISPOSITION:  Discharge to home.   FOLLOWUP:  With Dr. Eldridge Dace.   DIET:  Low-sodium diet.   ACTIVITY:  As tolerated.          ______________________________  Thora Lance, M.D.    JJG/MEDQ  D:   05/17/2008  T:  05/17/2008  Job:  [id number]

## 2010-06-04 NOTE — Assessment & Plan Note (Signed)
OFFICE VISIT   [name], [name]  DOB:  [birth year]                                        May 12, 2008  CHART #:  [id number]   The patient returned today for followup, status post coronary artery  bypass graft surgery x2 and aortic valve replacement with a 23-mm  Edwards pericardial valve on April 12, 2008.  He has been feeling well  overall and has no complaints today.  He has been walking daily without  any chest pain or shortness of breath.   PHYSICAL EXAMINATION:  VITAL SIGNS:  Blood pressure is 134/70, his pulse  is 63 and regular.  Respiratory rate is 18 and unlabored.  Oxygen  saturation on room air is 98%.  GENERAL:  He looks well.  CARDIAC:  Regular rate and rhythm with normal valve sounds.  LUNGS:  Decreased breath sounds at the left base.  CHEST:  The chest incision is healing well and the sternum is stable.  EXTREMITIES:  His leg incision is healing well.  There is no peripheral  edema.   Followup chest x-ray today shows small left pleural effusion that looks  a little larger than it was at the time of discharge.  Lungs are  otherwise clear.   His medications are lisinopril 10 mg daily, Toprol-XL 50 mg daily,  pravastatin 20 mg daily, and aspirin 325 mg daily.   IMPRESSION:  Overall, the patient is making a good recovery following  his surgery.  He does still have some left pleural effusion and I  started him on Lasix 40 mg daily and potassium 20 mEq daily x2 weeks.  He is currently asymptomatic.  I will see him back in 2 weeks with a  repeat chest x-ray to reevaluate the pleural effusion.  I told him he  could return to driving a car, but  should refrain from lifting anything heavier than 10 pounds for a total  of 3 months from date of surgery.  He will continue to follow up with  Dr. Eldridge Dace and I will see him back in 2 weeks.   [name], M.D.  Electronically Signed   BB/MEDQ  D:  05/12/2008  T:  05/13/2008  Job:  [id number]

## 2010-06-04 NOTE — Consult Note (Signed)
NEW PATIENT CONSULTATION   [name], [name]  DOB:  20-Jul-1926                                        April 04, 2008  CHART #:  [id number]   REASON FOR CONSULTATION:  Severe aortic stenosis.   CLINICAL HISTORY:  I was asked by Dr. Eldridge Dace to evaluate the patient  for consideration of aortic valve replacement.  He is a 77 year old  gentleman with known history of severe aortic stenosis who has been  followed by Dr. Eldridge Dace over the past few years.  He had been  asymptomatic until 03/27/2008 when he was walking up a hill after he had  already exercised for about an hour and said that he became lightheaded  and went to put his briefcase down and the next thing he remembers  people are standing over him and he was lying on the ground.  There is  no report of any seizure-type activity.  He ruled out for myocardial  infarction.  Echocardiogram on March 28, 2008, showed severe aortic  stenosis with an aortic valve area of 0.45 cm2 with a peak gradient of  92 and a mean gradient of 52.   REVIEW OF SYSTEMS:  General:  He denies any fever or chills.  He has had  no recent weight changes.  He denies fatigue and says that he goes to  gym about 3 days per week.  Eyes:  Negative.  ENT:  Negative.  He does  see a dentist regularly and has been undergoing some dental  reconstructions at the [facility].  Endocrine:  Denies  diabetes and hypothyroidism.  Cardiovascular:  He denies any chest pain  or pressure.  He has had no exertional dyspnea.  He denies PND and  orthopnea.  He has had no peripheral edema.  He denies palpitations.  Respiratory:  He denies cough and sputum production.  GI:  He has had no  nausea or vomiting.  He denies melena and bright red blood per rectum.  GU:  He denies dysuria and hematuria.  Vascular:  He denies claudication  and phlebitis.  Neurological:  He denies any focal weakness or numbness.  He denies any prior dizziness or syncope.   His wife does report some  memory loss and she is concerned about possible early dementia.  Musculoskeletal:  He denies arthralgias and myalgias.  Hematological:  Negative.   PAST MEDICAL HISTORY:  Significant for hypertension and aortic stenosis.   PAST SURGICAL HISTORY:  He denies any prior surgeries.   SOCIAL HISTORY:  He is married and lives with his wife who has multiple  sclerosis.  He is her primary caretaker.  He smoked pipe in the past,  but had never smoked cigarettes.  He denies alcohol use.  He is retired.   FAMILY HISTORY:  Negative for cardiac disease.   PHYSICAL EXAMINATION:  VITAL SIGNS:  His blood pressure is 101/58, pulse  is 76 and regular.  His respiratory rate is 18 and unlabored.  Oxygen  saturation on room air is 97%.  GENERAL:  He is a well-developed elderly white male who looks younger  than his stated age.  HEENT:  Be normocephalic and atraumatic.  Pupils are equal and reactive  to light and accommodation.  Extraocular muscles are intact.  His throat  is clear.  NECK:  Normal carotid  pulses bilaterally.  There is a transmitted murmur  at both sides of his neck.  There is no adenopathy or thyromegaly.  LUNGS:  Clear.  CARDIAC:  Regular rate and rhythm with a grade 3/6 harsh systolic  murmur.  There is no diastolic murmur.  ABDOMEN: active bowel sounds.  His abdomen is soft, flat, nontender.  There are no palpable masses or organomegaly.  EXTREMITIES:  No peripheral edema.  Pedal pulses palpable bilaterally.  SKIN:  Warm and dry.  NEUROLOGIC:  Be alert and oriented x3.  Motor and sensory exams are  grossly normal.   IMPRESSION:  The patient has known severe aortic stenosis that has been  asymptomatic until recent syncopal episode.  I agree that it is time to  proceed with aortic valve replacement.  He is scheduled for cardiac  catheterization on Thursday of this week and we would timely plan to do  surgery next Wednesday.  I discussed the operative  procedure with he and  his wife including use of a tissue valve given his age.  We discussed  alternatives, benefits, and risks including but not limited to bleeding,  blood transfusion, infection, stroke, myocardial infarction, heart block  requiring permanent pacemaker, organ failure, and death.  He would like  to proceed.   [name], M.D.  Electronically Signed   BB/MEDQ  D:  04/04/2008  T:  04/05/2008  Job:  [id number]   cc:   Corky Crafts, MD  Thora Lance, M.D.

## 2010-06-04 NOTE — Assessment & Plan Note (Signed)
OFFICE VISIT   COURY, GRIEGER  DOB:  03/25/1926                                        May 23, 2008  CHART #:  [id number]   The patient returned today for followup of a left pleural effusion,  status post coronary artery bypass surgery and aortic valve replacement  on April 12, 2008.  I saw him on May 12, 2008, at which time he had a  small-to-moderate sized left pleural effusion.  I started him on Lasix  40 and potassium 20 x2 weeks.  He has remained asymptomatic.   PHYSICAL EXAMINATION:  VITAL SIGNS:  Blood pressure 113/61, his pulse is  76 and regular.  His respiratory rate is 16 and unlabored.  Saturation  is 98% on room air.  GENERAL:  He looks well.  CARDIAC:  Regular rate and rhythm with normal valve sounds.  LUNGS:  Decreased breath sounds at the left base.  CHEST:  The chest incision is healing well and sternum is stable.  EXTREMITIES:  There is no peripheral edema.   Followup chest x-ray today shows decreased size of the left pleural  effusion.  There is still a small left pleural effusion.  Lungs are  otherwise clear.   IMPRESSION:  The patient had a postoperative left pleural effusion that  is decreasing after diuresis.  He has no peripheral edema and therefore  I decided not to continue him on Lasix any further.  He will continue to  ambulate and work on his incentive spirometer.  Hopefully the remainder  of the effusion will resolve on its own.  I will plan to see him back in  1 month with repeat chest x-ray.   [name], M.D.  Electronically Signed   BB/MEDQ  D:  05/23/2008  T:  05/24/2008  Job:  [id number]

## 2010-06-04 NOTE — Discharge Summary (Signed)
[name]              ACCOUNT NO.:  [id number]   MEDICAL RECORD NO.:  [id number]          PATIENT TYPE:  INP   LOCATION:  2016                         FACILITY:  [facility]   PHYSICIAN:  [name], M.D.     DATE OF BIRTH:  [birth year]   DATE OF ADMISSION:  04/12/2008  DATE OF DISCHARGE:                               DISCHARGE SUMMARY   PRIMARY ADMITTING DIAGNOSIS:  Severe aortic stenosis.   ADDITIONAL/DISCHARGE DIAGNOSES:  1. Severe aortic stenosis.  2. Coronary artery disease.  3. Hypertension.  4. Postoperative bradycardia.  5. Prior history of pipe smoking.  6. Postoperative acute blood loss anemia.   PROCEDURES PERFORMED:  1. Coronary artery bypass grafting x2 (left internal mammary artery to      the left anterior descending, saphenous vein graft to the obtuse      marginal).  2. Endoscopic vein harvest, right thigh.  3. Aortic valve replacement with #23 Edwards magna Ease pericardial      valve.   HISTORY:  The patient is a 77 year old male who was recently referred to  Dr. Laneta Bowman for consideration of aortic valve replacement.  He has a  known history of severe aortic stenosis and has been followed by Dr.  Eldridge Bowman over the past several years.  He had been previously  asymptomatic until approximately 1 month ago.  At that point, he became  very lightheaded and dizzy following exercise and had what appears to be  a syncopal episode.  He was brought into the hospital and subsequently  ruled out for myocardial infarction.  He underwent an echocardiogram on  March 28, 2008, which showed a severe aortic stenosis with an aortic  valve area of 0.4 cm2 with a peak gradient of 92 and a mean gradient of  52.  Dr. Laneta Bowman saw the patient as an outpatient consultation and  reviewed his previous studies.  Because of the patient's worsening  symptoms and progressive aortic stenosis, it was felt that he would  benefit from aortic valve replacement.  He did undergo cardiac  catheterization prior to proceeding with surgery and was found to have 2-  vessel coronary artery disease.  Dr. Laneta Bowman felt that he would benefit  from grafts also to the LAD and to the OM at the time of his valve  replacement.  He explained all risks, benefits, and alternatives of the  surgery to the patient and the patient agreed to proceed.   HOSPITAL COURSE:  [name] was admitted to [facility] on  April 12, 2008.  He was taken to the operating room where he underwent  CABG and aortic valve replacement as described above performed by Dr.  Laneta Bowman.  Please see operative report for complete details of surgery.  He tolerated the procedure well and was transferred to the SICU in  stable condition.  He was able to be extubated shortly after surgery.  He was hemodynamically stable and doing well on postop day #1.  He did  initially have some sinus bradycardia and required AAI pacing.  His beta-  blocker was also initially held.  Since that  time, his heart rate has  recovered and he is now maintaining sinus rhythm in 80s and 90s  intrinsically.  His pacer has been discontinued and he has been started  on a low-dose beta-blocker.  Also, in the initial postoperative period,  he was found to be anemic with a hemoglobin of 8.5 and hematocrit of  24.1, and underwent a transfusion of packed red blood cells.  His chest  tubes and lines were removed on postop day #1.  He was able to be  transferred to the step-down unit.  Postoperatively, he was doing well.  He has been mildly volume overloaded and was started on Lasix to which  he is responding well.  He has remained afebrile and his vital signs  have been stable.  His incisions are healing well.  He is ambulating in  the halls with cardiac rehab phase one and is progressing well.  He is  tolerating a regular diet and is having normal bowel and bladder  function.  His most recent labs on April 14, 2008, show hemoglobin of  9.7,  hematocrit 27.1, platelets 110, and white count 13.8.  Sodium 141,  potassium 3.4, BUN 24, and creatinine 1.16.  Chest x-ray shows minimal  bilateral effusions.  He is mildly edematous on physical exam and his  weight remains approximately 4 kg above preoperative weight.  He will  continue to be observed closely over the next 48 hours or so.  It is  anticipated that if he continues to remain stable and if no acute  changes have occurred, he will be ready for discharge home on/or about  April 16, 2008.   DISCHARGE MEDICATIONS:  1. Enteric-coated aspirin 325 mg daily.  2. Toprol-XL 25 mg daily.  3. Lasix 40 mg daily x3 days.  4. Potassium 20 mEq daily x3 days.  5. Oxycodone 5 mg 1-2 q.4 h. p.r.n. for pain.  6. Pravastatin 20 mg daily.  7. Multivitamin daily.   DISCHARGE INSTRUCTIONS:  He is asked to refrain from driving, heavy  lifting, or strenuous activity.  He may continue ambulating daily and  use his incentive spirometer.  He may shower daily and clean his  incisions with soap and water.   DIET:  He will continue a low-fat, low-sodium diet.   DISCHARGE FOLLOWUP:  He will need to make an appointment to see Dr.  Eldridge Bowman in 2 weeks.  He will then follow up with [facility] office to see Dr.  Laneta Bowman on May 02, 2008, with a chest x-ray from [facility] imaging.  If he experiences any problems or has questions in the interim, he is  asked to contact our office.      [name], P.A.      [name], M.D.  Electronically Signed    GC/MEDQ  D:  04/14/2008  T:  04/14/2008  Job:  [id number]   cc:   Allen Crafts, MD  [name], M.D.

## 2010-06-04 NOTE — Discharge Summary (Signed)
NAMERONAN, DUECKER              ACCOUNT NO.:  [id number]   MEDICAL RECORD NO.:  [id number]          PATIENT TYPE:  INP   LOCATION:  2028                         FACILITY:  [facility]   PHYSICIAN:  [name], M.D.     DATE OF BIRTH:  [birth year]   DATE OF ADMISSION:  04/12/2008  DATE OF DISCHARGE:  04/18/2008                               DISCHARGE SUMMARY   ADDENDUM   [name] has continued to progress nicely.  There was some question  as to whether he would be going to a skilled nursing facility versus  home, and this has been finalized, and he will be going home with some  home care arrangements.  The only additional issue from the previous  dictation is he did have an episode where he had some intermittent  atrial fibrillation.  His beta-blocker was adjusted, and he now  maintains normal sinus rhythm for greater than 24 hours.  He is  otherwise continuing to progress nicely and is felt to be stable for  discharge.   MEDICATIONS AT DISCHARGE:  As previously dictated, with the exception,  Toprol-XL is now 50 mg daily.   For further details of this hospitalization, please see the previously  dictated summary.      [name], P.A.-C.      [name], M.D.  Electronically Signed    WEG/MEDQ  D:  04/18/2008  T:  04/19/2008  Job:  [id number]   cc:   Allen Crafts, MD  [name], M.D.

## 2010-06-04 NOTE — Cardiovascular Report (Signed)
[name], [name]              ACCOUNT NO.:  [id number]   MEDICAL RECORD NO.:  [id number]          PATIENT TYPE:  OIB   LOCATION:  1962                         FACILITY:  [facility]   PHYSICIAN:  Corky Crafts, MDDATE OF BIRTH:  12-27-1926   DATE OF PROCEDURE:  04/06/2008  DATE OF DISCHARGE:  04/06/2008                            CARDIAC CATHETERIZATION   REFERRING PHYSICIAN:  Thora Lance, MD   PROCEDURES PERFORMED:  Right heart catheterization, coronary angiogram,  and abdominal aortogram.   INDICATIONS:  Preoperative evaluation before aortic valve replacement.   PROCEDURE NARRATIVE:  The risks and benefits of heart catheterization  were explained to the patient and informed consent was obtained.  He was  brought to the cath lab.  He was prepped and draped in the usual sterile  fashion.  His right groin was infiltrated with 1% lidocaine.  A 7-French  sheath was placed into the right femoral vein using the modified  Seldinger technique.  A 4-French sheath was placed into the right  femoral artery using the modified Seldinger technique.  A Swan-Ganz  catheter was advanced to the pulmonary artery, wedge position under  fluoroscopic guidance.  The catheter was pulled back and various  hemodynamic pressures were recorded.  A JL-4 pigtail catheter was used  to engage the ostium of the left coronary artery.  Digital angiography  was performed in multiple projections using hand injection of contrast.  A JR-4 catheter was used for the right coronary artery in a similar  fashion.  A pigtail catheter was advanced to the abdominal aorta and  power injection contrast was used to image the abdominal aorta.  The  sheaths were removed using manual compression.   FINDINGS:  1. Right heart catheterization:  Pulmonary artery saturation 63%.      Aortic saturation 98%.  Pulmonary capillary wedge pressure 23/26      with a mean wedge of 17.  Pulmonary artery pressure 38/16 with a      mean  PA pressure of 27.  Aortic pressure 153/72 with a mean aortic      pressure of 105, RV pressure of 44/9 with an RVEDP of 11, RA      pressure 12/10 with a mean RA pressure of 8.  Cardiac output      calculated to be 3.7 with a cardiac index of 2.  2. Left ventricle was not assessed to avoid the risk of crossing      calcified aortic valve.  The left main was widely patent.  The left      circumflex was a large codominant vessel.  There were mild      irregularities throughout this vessel.  There was a large OM-1      branch which had a proximal 80% stenosis.  There are mild      irregularities throughout the larger branch of this OM-1 vessel.  3. The left anterior descending is a medium-sized vessel.  There is      moderate diffuse atherosclerosis in the proximal and mid vessel.      At the origin of the first diagonal,  there is a 40-50% lesion in      the LAD.  Further down the midportion of the vessel, there is a      focal 70-80% stenosis in the mid vessel.  The distal vessel appears      small but widely patent.  The first diagonal is widely patent with      mild luminal irregularities.  The second diagonal is a small vessel      and patent.  4. The right coronary artery is a small codominant vessel.  The      abdominal aortogram reveals bilateral single renal arteries.  There      appears to be fibromuscular dysplasia noted in the mid right renal      artery.  There is a proximal 50-60% stenosis in the right renal      artery.  The left renal artery appears widely patent.  In a branch      to the lower portion of the kidney, there appears to be some mild      fibromuscular dysplasia.  The aorta has moderate diffuse      atherosclerosis with no area of aneurysm.  Bilateral common iliacs      appear widely patent.   IMPRESSION:  1. Known severe aortic stenosis by echocardiogram.  2. Two-vessel coronary artery disease including the left anterior      descending and circumflex  system.  3. Right renal artery stenosis secondary to fibromuscular dysplasia.  4. Cardiac output of 3.7 L/minute.  Cardiac index of 2.  Mean      pulmonary capillary wedge pressure 17 mmHg.  Pulmonary artery      pressure 38/16 with a mean PA pressure of 27 mmHg.  PA saturation      63%.   RECOMMENDATIONS:  The patient will go ahead with the aortic valve  replacement and likely have bypass surgery at the same time.  The  results of this catheterization will be discussed with Dr. Laneta Simmers.  The  plan will be to discharge him home today.      Corky Crafts, MD  Electronically Signed     JSV/MEDQ  D:  04/06/2008  T:  04/07/2008  Job:  [id number]   cc:   Thora Lance, M.D.  [name], M.D.

## 2011-11-12 ENCOUNTER — Other Ambulatory Visit: Payer: Self-pay | Admitting: Neurology

## 2011-11-12 DIAGNOSIS — R413 Other amnesia: Secondary | ICD-10-CM

## 2011-11-17 ENCOUNTER — Ambulatory Visit
Admission: RE | Admit: 2011-11-17 | Discharge: 2011-11-17 | Disposition: A | Discharge status: Home / Self Care | Payer: Medicare Other | Source: Ambulatory Visit | Attending: Neurology | Admitting: Neurology

## 2011-11-17 DIAGNOSIS — R413 Other amnesia: Secondary | ICD-10-CM

## 2012-10-18 ENCOUNTER — Encounter: Payer: Self-pay | Admitting: Neurology

## 2012-10-19 ENCOUNTER — Ambulatory Visit (INDEPENDENT_AMBULATORY_CARE_PROVIDER_SITE_OTHER): Payer: Medicare Other | Admitting: Neurology

## 2012-10-19 ENCOUNTER — Encounter: Payer: Self-pay | Admitting: Neurology

## 2012-10-19 VITALS — BP 163/65 | HR 51 | Ht 68.0 in | Wt 170.0 lb

## 2012-10-19 DIAGNOSIS — D518 Other vitamin B12 deficiency anemias: Secondary | ICD-10-CM

## 2012-10-19 DIAGNOSIS — R6889 Other general symptoms and signs: Secondary | ICD-10-CM

## 2012-10-19 DIAGNOSIS — R413 Other amnesia: Secondary | ICD-10-CM

## 2012-10-19 HISTORY — DX: Other amnesia: R41.3

## 2012-10-19 NOTE — Progress Notes (Signed)
Reason for visit: Memory disturbance  [name] is a 79 y.o. male  History of present illness:  [name] is an 79 year old right-handed white male with a history of a progressive memory disturbance that has been present for 3 or 4 years. The patient has had some issues with remembering the names of individuals. 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 The patient will misplace things about the house more frequently. Overall, the patient is functioning fairly well, able to drive a car without difficulty. The patient has not otherwise altered any activities of daily living secondary to memory. The patient denies any focal numbness or weakness of the extremities, or problems controlling the bowels or the bladder. The patient may have some urinary urgency at times. The patient has had some difficulty understanding how to perform tasks efficiently. The patient denies any balance issues. The patient denies any fatigue or excessive daytime drowsiness. Overall, the patient has been relatively healthy. The patient comes to this office for an evaluation. There is no family history of memory problems. The wife has noted that the patient is not smiling or laughing as much as he used to, but the patient denies any sensation of depression.  Past Medical History  Diagnosis Date  . Severe aortic stenosis 2010    S/P AVR-TISSURE VALVE  . Coronary artery disease with hx of myocardial infarct w/o hx of CABG 2010  . Hypertension   . Hyperlipidemia   . BPH (benign prostatic hyperplasia)   . Impaired fasting glucose   . Diverticulosis   . Mesenteric panniculitis   . Memory loss   . Retinal tear   . Memory difficulty 10/19/2012    Past Surgical History  Procedure Laterality Date  . Tonsillectomy    . Aortic valve replacement    . 2 vessel cabg    . Bartle    . Cataract extraction, bilateral      RT 2010 LT 2014    Family History   Problem Relation Age of Onset  . Stroke Father   . Cancer Brother     Social history:  reports that he has been smoking Pipe.  He has never used smokeless tobacco. He reports that  drinks alcohol. He reports that he does not use illicit drugs.  Medications:  Current Outpatient Prescriptions on File Prior to Visit  Medication Sig Dispense Refill  . amoxicillin (AMOXIL) 500 MG capsule Take 500 mg by mouth 3 (three) times daily. PRIOR TO DENTAL PROCEDURE.      Marland Kitchen aspirin EC 81 MG tablet Take 81 mg by mouth daily.      . beta carotene w/minerals (OCUVITE) tablet Take 1 tablet by mouth daily.      . cholecalciferol (VITAMIN D) 1000 UNITS tablet Take 1,000 Units by mouth daily.      Marland Kitchen lisinopril (PRINIVIL,ZESTRIL) 20 MG tablet Take 20 mg by mouth daily.      . metoprolol tartrate (LOPRESSOR) 25 MG tablet Take 25 mg by mouth 2 (two) times daily.      . pravastatin (PRAVACHOL) 20 MG tablet Take 20 mg by mouth daily.       No current facility-administered medications on file prior to visit.     No Known Allergies  ROS:  Out of a complete 14 system review of symptoms, the patient complains only of the following symptoms, and all other reviewed systems are negative.  Memory loss  Blood  pressure 163/65, pulse 51, height 5\' 8"  (1.727 m), weight 170 lb (77.111 kg).  Physical Exam  General: The patient is alert and cooperative at the time of the examination.  Head: Pupils are equal, round, and reactive to light. Discs are flat bilaterally.  Neck: The neck is supple, no carotid bruits are noted.  Respiratory: The respiratory examination is clear.  Cardiovascular: The cardiovascular examination reveals a regular rate and rhythm, no obvious murmurs or rubs are noted.  Skin: Extremities are without significant edema.  Neurologic Exam  Mental status: Mini-Mental status examination done today shows a total score of 30 out of 30.  Cranial nerves: Facial symmetry is present. There is good  sensation of the face to pinprick and soft touch bilaterally. The strength of the facial muscles and the muscles to head turning and shoulder shrug are normal bilaterally. Speech is well enunciated, no aphasia or dysarthria is noted. Extraocular movements are full. Visual fields are full.  Motor: The motor testing reveals 5 over 5 strength of all 4 extremities. Good symmetric motor tone is noted throughout.  Sensory: Sensory testing is intact to pinprick, soft touch, vibration sensation, and position sense on all 4 extremities. No evidence of extinction is noted.  Coordination: Cerebellar testing reveals good finger-nose-finger and heel-to-shin bilaterally.  Gait and station: Gait is normal. Tandem gait is slightly unsteady. Romberg is negative. No drift is seen.  Reflexes: Deep tendon reflexes are symmetric and normal bilaterally. Toes are downgoing bilaterally.   Assessment/Plan:  One. Mild memory disturbance  The patient has very minimal problems with remembering names for people. The patient will be followed over time for changing memory issues. MRI of the brain will be done, and some blood work will be done today. The patient will followup in 6-8 months. The patient will need to be monitored for possible underlying depression. Neuropsychological evaluation in the future may be helpful.  Allen Palau MD 10/19/2012 5:21 PM  [facility] [address] [place], [place] [address]  Phone [phone] Fax [phone]

## 2012-10-20 LAB — TSH: TSH: 1.23 u[IU]/mL (ref 0.450–4.500)

## 2012-10-20 NOTE — Progress Notes (Signed)
Quick Note:  I called and gave the results of labs to wife and pt. She verbalized understanding. ______

## 2012-11-01 ENCOUNTER — Ambulatory Visit
Admission: RE | Admit: 2012-11-01 | Discharge: 2012-11-01 | Disposition: A | Discharge status: Home / Self Care | Payer: Medicare Other | Source: Ambulatory Visit | Attending: Neurology | Admitting: Neurology

## 2012-11-01 ENCOUNTER — Other Ambulatory Visit: Payer: Self-pay | Admitting: Neurology

## 2012-11-01 DIAGNOSIS — Z139 Encounter for screening, unspecified: Secondary | ICD-10-CM

## 2012-11-06 ENCOUNTER — Ambulatory Visit
Admission: RE | Admit: 2012-11-06 | Discharge: 2012-11-06 | Disposition: A | Discharge status: Home / Self Care | Payer: Medicare Other | Source: Ambulatory Visit | Attending: Neurology | Admitting: Neurology

## 2012-11-06 DIAGNOSIS — R413 Other amnesia: Secondary | ICD-10-CM

## 2012-11-08 ENCOUNTER — Telehealth: Payer: Self-pay | Admitting: Neurology

## 2012-11-08 DIAGNOSIS — R413 Other amnesia: Secondary | ICD-10-CM

## 2012-11-08 NOTE — Telephone Encounter (Signed)
I called patient. The MRI study of the brain shows moderate atrophy. No small vessel disease. I'll get the patient set up for formal neuropsychological testing. The patient could potentially have a frontotemporal dementia. The patient has minimal memory issues.

## 2013-01-25 ENCOUNTER — Encounter: Payer: Self-pay | Admitting: Interventional Cardiology

## 2013-01-25 ENCOUNTER — Encounter (INDEPENDENT_AMBULATORY_CARE_PROVIDER_SITE_OTHER): Payer: Self-pay

## 2013-01-25 ENCOUNTER — Ambulatory Visit (INDEPENDENT_AMBULATORY_CARE_PROVIDER_SITE_OTHER): Payer: Medicare HMO | Admitting: Interventional Cardiology

## 2013-01-25 VITALS — BP 138/70 | HR 54 | Ht 68.0 in | Wt 168.0 lb

## 2013-01-25 DIAGNOSIS — I10 Essential (primary) hypertension: Secondary | ICD-10-CM

## 2013-01-25 DIAGNOSIS — E782 Mixed hyperlipidemia: Secondary | ICD-10-CM | POA: Insufficient documentation

## 2013-01-25 DIAGNOSIS — I2510 Atherosclerotic heart disease of native coronary artery without angina pectoris: Secondary | ICD-10-CM

## 2013-01-25 DIAGNOSIS — I359 Nonrheumatic aortic valve disorder, unspecified: Secondary | ICD-10-CM

## 2013-01-25 NOTE — Progress Notes (Signed)
Patient ID: [name], male   DOB: [birth year], 80 y.o.   MRN: [id number]    [place], [name], [name]  [address] Phone: [phone] Fax:  [phone]  Date:  01/25/2013   ID:  [name], DOB 1926/05/30, MRN [id number]  PCP:  Allen Shelling, MD      History of Present Illness: [name] is a 80 y.o. male who had an AVR/CABG in 2010. Blood preesure at the [facility] is usually in the 725-366 range systolic. He exercises and gets his HR to the 100 range. His daughter was diagnosed with lung cancer. She is a nonsmoker and she received chemotherapy. He has been feeling well and exercising several times a week. CAD/ASCVD:  plays 9 holes of golf; [facility]. BP in the 440-347 range systolic at the [facility]. Denies : Chest pain.  Diaphoresis.  Dizziness.  Dyspnea on exertion.  Leg edema.  Nitroglycerin.  Orthopnea.  Palpitations.  Paroxysmal nocturnal dyspnea.  Syncope.     Wt Readings from Last 3 Encounters:  01/25/13 168 lb (76.204 kg)  10/19/12 170 lb (77.111 kg)  10/18/12 166 lb 12.8 oz (75.66 kg)     Past Medical History  Diagnosis Date  . Severe aortic stenosis 2010    S/P AVR-TISSURE VALVE  . Coronary artery disease with hx of myocardial infarct w/o hx of CABG 2010  . Hypertension   . Hyperlipidemia   . BPH (benign prostatic hyperplasia)   . Impaired fasting glucose   . Diverticulosis   . Mesenteric panniculitis   . Memory loss   . Retinal tear   . Memory difficulty 10/19/2012    Current Outpatient Prescriptions  Medication Sig Dispense Refill  . amoxicillin (AMOXIL) 500 MG capsule Take 500 mg by mouth 3 (three) times daily. PRIOR TO DENTAL PROCEDURE.      Marland Kitchen aspirin EC 81 MG tablet Take 81 mg by mouth daily.      . cholecalciferol (VITAMIN D) 1000 UNITS tablet Take 1,000 Units by mouth daily.      Marland Kitchen lisinopril (PRINIVIL,ZESTRIL) 20 MG tablet Take 20 mg by mouth daily.      . metoprolol  tartrate (LOPRESSOR) 25 MG tablet Take 25 mg by mouth 2 (two) times daily.      . pravastatin (PRAVACHOL) 20 MG tablet Take 20 mg by mouth daily.       No current facility-administered medications for this visit.    Allergies:   No Known Allergies  Social History:  The patient  reports that he has been smoking Pipe.  He has never used smokeless tobacco. He reports that he drinks alcohol. He reports that he does not use illicit drugs.   Family History:  The patient's family history includes Cancer in his brother; Stroke in his father.   ROS:  Please see the history of present illness.  No nausea, vomiting.  No fevers, chills.  No focal weakness.  No dysuria.    All other systems reviewed and negative.   PHYSICAL EXAM: VS:  BP 138/70  Pulse 54  Ht 5' 8"  (1.727 m)  Wt 168 lb (76.204 kg)  BMI 25.55 kg/m2 Well nourished, well developed, in no acute distress HEENT: normal Neck: no JVD, no carotid bruits Cardiac:  normal S1, S2; RRR;  Lungs:  clear to auscultation bilaterally, no wheezing, rhonchi or rales Abd: soft, nontender, no hepatomegaly Ext: no edema Skin: warm and dry Neuro:  no focal abnormalities noted  EKG:  NST, NSST, no cange from prior    ASSESSMENT AND PLAN:  Coronary atherosclerosis of native coronary artery  Continue Aspirin Tablet, 81 MG, 1 tablet, Orally, Once a day LAB: Comp Metabolic Panel (Ordered for 03/03/2012)   GLUCOSE 132 70-99 - mg/dL H  BUN 18 6-26 - mg/dL   CREATININE 0.98 0.60-1.30 - mg/dl   eGFR (NON-AFRICAN AMERICAN) 73 >60 - calc   eGFR (AFRICAN AMERICAN) 88 >60 - calc   SODIUM 140 136-145 - mmol/L   POTASSIUM 4.5 3.5-5.5 - mmol/L   CHLORIDE 107 98-107 - mmol/L   C02 30 22-32 - mmol/L   ANION GAP 7.2 6.0-20.0 - mmol/L   CALCIUM 9.0 8.6-10.3 - mg/dL   T PROTEIN 6.4 6.0-8.3 - g/dL   ALBUMIN 3.8 3.4-4.8 - g/dL   T.BILI 1.0 0.3-1.0 - mg/dL   ALP 53 38-126 - U/L   AST 16 0-39 - U/L   ALT 18 0-52 - U/L    [name],Allen 03/01/2012 01:06:12 PM  > elevated glucose. otherwise stable kidney function. Bowman,Allen 03/01/2012 03:20:43 PM > lmom per Dpr. To Dr. Laurann Montana. [name] 03/01/2012 05:43:31 PM >   LAB: Lipid Panel w/Direct LDL (Ordered for 03/03/2012)   CHOLESTEROL 124 <200 - mg/dL   TRIG 80 0-199 - mg/dL   DIRECT HDL 39 30-70 - mg/dL   DLDL 72 0-99 - mg/dL   NON-HDL 84 0-129 - mg/dL   CHOL/HDL 3.1 2.0-4.0 - Ratio    [name],Allen 03/01/2012 01:06:44 PM > well controlled. continue current meds Bowman,Allen 03/01/2012 03:21:13 PM > lmom per dpr.   Diagnostic Imaging:EKG Bowman,Allen 02/23/2012 10:52:19 AM > [name],Allen 02/23/2012 11:11:01 AM > sinus bradycardia, nonspecific ST segment changes  No angina.  s/p CABG.    2. Mixed hyperlipidemia  Continue Pravachol Tablet, 20 MG, 1 tablet, Orally, Once a day  LDL 79 in 3/14 Lipids well controlled last year as above. To be checked in a month or so. LDL 70 in 2013.    3. Aortic valve disorders  Continue Amoxicillin Tablet, 500 MG, 4 tablet, Orally, 1 hour prior to dental treatment Continue SBE prophylaxis.    4. Hypertension, essential  Continue Lisinopril Tablet, 20 MG, 1 tablet, Orally, Once a day, 90, Refills 3 Continue Metoprolol Tartrate Tablet, 25 MG, 1 tablet, Orally, Twice a day Somewhat elevated. Increase meds. COntinue to check BP outside of [facility]. If BP > 140/90 consistently, let us know.    Procedure Codes     Signed, Allen Marble, MD, [name] 01/25/2013 11:06 AM

## 2013-01-25 NOTE — Patient Instructions (Signed)
Your physician recommends that you continue on your current medications as directed. Please refer to the Current Medication list given to you today.  Your physician wants you to follow-up in: [place] DR. VARANASI. You will receive a reminder letter in the mail two months in advance. If you don't receive a letter, please call our office to schedule the follow-up appointment.

## 2013-02-01 ENCOUNTER — Telehealth: Payer: Self-pay | Admitting: Neurology

## 2013-02-01 NOTE — Telephone Encounter (Signed)
I called the patient I talked with the wife. The neuropsychological evaluation was done and looked unremarkable. The patient likely does not have Alzheimer's disease or frontotemporal dementia. He may have mild depression.

## 2013-02-05 DIAGNOSIS — R413 Other amnesia: Secondary | ICD-10-CM

## 2013-02-15 ENCOUNTER — Other Ambulatory Visit: Payer: Self-pay

## 2013-02-15 ENCOUNTER — Other Ambulatory Visit: Payer: Self-pay | Admitting: Interventional Cardiology

## 2013-02-15 MED ORDER — METOPROLOL TARTRATE 25 MG PO TABS: 25.0000 mg | ORAL_TABLET | Freq: Two times a day (BID) | ORAL | Status: DC

## 2013-02-15 MED ORDER — LISINOPRIL 20 MG PO TABS: 20.0000 mg | ORAL_TABLET | Freq: Every day | ORAL | Status: DC

## 2013-03-30 ENCOUNTER — Other Ambulatory Visit: Payer: Self-pay

## 2013-03-30 MED ORDER — LISINOPRIL 20 MG PO TABS: 20.0000 mg | ORAL_TABLET | Freq: Every day | ORAL | Status: DC

## 2013-03-30 MED ORDER — PRAVASTATIN SODIUM 20 MG PO TABS: ORAL_TABLET | ORAL | Status: DC

## 2013-04-01 ENCOUNTER — Other Ambulatory Visit: Payer: Self-pay

## 2013-04-01 MED ORDER — METOPROLOL TARTRATE 25 MG PO TABS: 25.0000 mg | ORAL_TABLET | Freq: Two times a day (BID) | ORAL | Status: DC

## 2013-04-25 ENCOUNTER — Encounter: Payer: Self-pay | Admitting: Neurology

## 2013-04-25 ENCOUNTER — Encounter (INDEPENDENT_AMBULATORY_CARE_PROVIDER_SITE_OTHER): Payer: Self-pay

## 2013-04-25 ENCOUNTER — Ambulatory Visit (INDEPENDENT_AMBULATORY_CARE_PROVIDER_SITE_OTHER): Payer: Medicare HMO | Admitting: Neurology

## 2013-04-25 VITALS — BP 154/77 | HR 72 | Ht 68.0 in | Wt 167.5 lb

## 2013-04-25 DIAGNOSIS — R413 Other amnesia: Secondary | ICD-10-CM

## 2013-04-25 NOTE — Progress Notes (Signed)
Reason for visit: Memory disturbance  [name] is an 80 y.o. male  History of present illness:  [name] is an 80 year old right-handed white male with a history of some reported issues with memory. The patient indicates he has some issues with remembering names for people, and he is having more difficulty playing bridge at a high functional level. The patient has not had any alteration in his activities of daily living. The patient has scored well on the Mini-Mental status examination, and a recent neuropsychological evaluation indicated that his cognitive functioning was normal. The patient returns to the office today for an evaluation. The patient is under a lot of stress with the illness of his wife and children who have fasciculocapulohumeral dystrophy. The patient returns for an evaluation.  Past Medical History  Diagnosis Date  . Severe aortic stenosis 2010    S/P AVR-TISSURE VALVE  . Coronary artery disease with hx of myocardial infarct w/o hx of CABG 2010  . Hypertension   . Hyperlipidemia   . BPH (benign prostatic hyperplasia)   . Impaired fasting glucose   . Diverticulosis   . Mesenteric panniculitis   . Memory loss   . Retinal tear   . Memory difficulty 10/19/2012    Past Surgical History  Procedure Laterality Date  . Tonsillectomy    . Aortic valve replacement    . 2 vessel cabg    . Bartle    . Cataract extraction, bilateral      RT 2010 LT 2014    Family History  Problem Relation Age of Onset  . Stroke Father   . Cancer Brother     Social history:  reports that he has been smoking Pipe.  He has never used smokeless tobacco. He reports that he drinks about 0.6 ounces of alcohol per week. He reports that he does not use illicit drugs.   No Known Allergies  Medications:  Current Outpatient Prescriptions on File Prior to Visit  Medication Sig Dispense Refill  . amoxicillin (AMOXIL) 500 MG capsule Take 500 mg by mouth 3 (three) times daily.  PRIOR TO DENTAL PROCEDURE.      Marland Kitchen aspirin EC 81 MG tablet Take 81 mg by mouth daily.      . cholecalciferol (VITAMIN D) 1000 UNITS tablet Take 1,000 Units by mouth daily.      Marland Kitchen lisinopril (PRINIVIL,ZESTRIL) 20 MG tablet Take 1 tablet (20 mg total) by mouth daily.  30 tablet  3  . metoprolol tartrate (LOPRESSOR) 25 MG tablet Take 1 tablet (25 mg total) by mouth 2 (two) times daily.  60 tablet  6  . pravastatin (PRAVACHOL) 20 MG tablet TAKE 1 TABLET BY MOUTH EVERY DAY  90 tablet  1   No current facility-administered medications on file prior to visit.    ROS:  Out of a complete 14 system review of symptoms, the patient complains only of the following symptoms, and all other reviewed systems are negative.  Hearing loss, runny nose, drooling Frequency of urination Acting out dreams Back pain Memory loss, dizziness Depression  Blood pressure 154/77, pulse 72, height 5\' 8"  (1.727 m), weight 167 lb 8 oz (75.978 kg).  Physical Exam  General: The patient is alert and cooperative at the time of the examination.  Skin: No significant peripheral edema is noted.   Neurologic Exam  Mental status: The patient is oriented x 3. Mini-Mental status examination done today shows a total score of 30/30. Animal fluency test revealed a  score of 17.  Cranial nerves: Facial symmetry is present. Speech is normal, no aphasia or dysarthria is noted. Extraocular movements are full. Visual fields are full.  Motor: The patient has good strength in all 4 extremities.  Sensory examination: Soft touch sensation is symmetric on the face, arms, and legs.  Coordination: The patient has good finger-nose-finger and heel-to-shin bilaterally. The patient appears to have some mild apraxia with the use of the lower extremities.  Gait and station: The patient has a normal gait. Tandem gait is normal. Romberg is negative. No drift is seen.  Reflexes: Deep tendon reflexes are symmetric.   Assessment/Plan:  1.  Reported memory disturbance  The patient likely has a mild underlying depression issue. All testing for memory indicates that the patient is doing fairly well at this point. The patient will be followed over time, and he will followup in one year. The patient is under a lot of stress with the illness is of his wife and children, but he does not wish to have any medications for depression at this time.  Jill Alexanders MD 04/25/2013 8:55 PM  [facility] [address] [place], [place] [address]  Phone [phone] Fax [phone]

## 2013-08-27 ENCOUNTER — Encounter: Payer: Self-pay | Admitting: Interventional Cardiology

## 2013-08-30 ENCOUNTER — Other Ambulatory Visit: Payer: Self-pay | Admitting: *Deleted

## 2013-08-30 MED ORDER — METOPROLOL TARTRATE 25 MG PO TABS
25.0000 mg | ORAL_TABLET | Freq: Two times a day (BID) | ORAL | Status: DC
Start: 2013-08-30 — End: 2014-01-09

## 2013-08-30 MED ORDER — LISINOPRIL 20 MG PO TABS
20.0000 mg | ORAL_TABLET | Freq: Every day | ORAL | Status: DC
Start: 2013-08-30 — End: 2013-10-06

## 2013-08-30 MED ORDER — PRAVASTATIN SODIUM 20 MG PO TABS: ORAL_TABLET | ORAL | Status: DC

## 2013-09-04 ENCOUNTER — Other Ambulatory Visit: Payer: Self-pay | Admitting: Interventional Cardiology

## 2013-10-05 ENCOUNTER — Encounter: Payer: Self-pay | Admitting: *Deleted

## 2013-10-05 ENCOUNTER — Telehealth: Payer: Self-pay | Admitting: *Deleted

## 2013-10-05 ENCOUNTER — Ambulatory Visit (INDEPENDENT_AMBULATORY_CARE_PROVIDER_SITE_OTHER): Payer: Medicare HMO | Admitting: *Deleted

## 2013-10-05 VITALS — BP 166/73 | HR 64 | Ht 68.0 in

## 2013-10-05 DIAGNOSIS — I10 Essential (primary) hypertension: Secondary | ICD-10-CM

## 2013-10-05 NOTE — Patient Instructions (Signed)
Your physician recommends that you continue on your current medications as directed. Please refer to the Current Medication list given to you today.  We will contact you after reviewing this with Dr. Irish Lack when he returns to the office tomorrow.

## 2013-10-05 NOTE — Progress Notes (Signed)
Patient comes in today after going to [facility] for dental appointment. While there they took vitals signs and pt found to be hypertensive. Readings taken there are:  10:00 am   183/89  10:30 am   198/90  12:00 pm   196/88

## 2013-10-05 NOTE — Progress Notes (Signed)
Reviewed reading with DOD (Dr. Acie Fredrickson), no orders received except to have addressed by Dr. Irish Lack when he returns to office tomorrow. Information given to his assist to address with him tomorrow. Patient aware someone will contact him tomorrow in regards to BP.

## 2013-10-05 NOTE — Telephone Encounter (Signed)
Received call from Shanon Brow a Magazine features editor at [facility] earlier today.  States pt is there for dental evaluation.  States his  BP is high; 183/89; 190/90;  196/88 with HR 50-57.  States he feels fine. No c/o dizziness or headache.  Wants to know what we recommended.  Advised if he is feeling ok may drive home. Advised will check on him later today.  4:30 called pt.  He states he still is feeling fine. No symptoms.  Has not taken BP.  Took while on the phone with him.  BP 177/72 HR 65. Advised to continue to monitor. Reviewed diet restrictions ie salt and processed meats.  They will try to watch diet more carefully.  Will call back if BP continues to be elevated.  [facility] and [facility] in 140/ range.

## 2013-10-06 ENCOUNTER — Telehealth: Payer: Self-pay | Admitting: Interventional Cardiology

## 2013-10-06 DIAGNOSIS — Z79899 Other long term (current) drug therapy: Secondary | ICD-10-CM

## 2013-10-06 MED ORDER — LISINOPRIL 20 MG PO TABS: 40.0000 mg | ORAL_TABLET | Freq: Every day | ORAL | Status: DC

## 2013-10-06 NOTE — Telephone Encounter (Signed)
New message     Bp readings for today is 190/70 somthing/////172/68.  He said he was to call and give bp readings to [name]

## 2013-10-06 NOTE — Telephone Encounter (Signed)
Increase lisinopril to 40 mg daily.  BMet in one week.

## 2013-10-06 NOTE — Addendum Note (Signed)
Addended byUlla Potash H on: 10/06/2013 02:17 PM   Modules accepted: Orders

## 2013-10-06 NOTE — Telephone Encounter (Signed)
Pt notified, meds updated and labs ordered.  

## 2013-10-06 NOTE — Progress Notes (Signed)
See telephone note.

## 2013-10-13 ENCOUNTER — Other Ambulatory Visit (INDEPENDENT_AMBULATORY_CARE_PROVIDER_SITE_OTHER): Payer: Medicare HMO

## 2013-10-13 DIAGNOSIS — Z79899 Other long term (current) drug therapy: Secondary | ICD-10-CM

## 2013-10-13 LAB — BASIC METABOLIC PANEL
BUN: 22 mg/dL (ref 6–23)
CALCIUM: 9.1 mg/dL (ref 8.4–10.5)
CO2: 24 meq/L (ref 19–32)
CREATININE: 1.1 mg/dL (ref 0.4–1.5)
Chloride: 104 mEq/L (ref 96–112)
GFR: 66.23 mL/min (ref >60.00)
GLUCOSE: 199 mg/dL — AB (ref 70–99)
Potassium: 4.4 mEq/L (ref 3.5–5.1)
SODIUM: 137 meq/L (ref 135–145)

## 2013-10-14 ENCOUNTER — Telehealth: Payer: Self-pay | Admitting: Interventional Cardiology

## 2013-10-14 NOTE — Telephone Encounter (Signed)
Spoke with pt, his bp has been running 170-190's/70-80. They are concerned about his bp being this high. His lisinopril was increased at his office visit 10-06-13 He has a dental appt at [place] on Friday next week and if his bp is not below 165 they will not see him. Aware dr Irish Lack will be back in the office Tuesday Will forward for his review

## 2013-10-14 NOTE — Telephone Encounter (Signed)
° °  Patient has questions regarding BP readings. Please call and advise.

## 2013-10-15 NOTE — Telephone Encounter (Signed)
Add amlodipine 5 mg daily

## 2013-10-17 MED ORDER — AMLODIPINE BESYLATE 5 MG PO TABS: 5.0000 mg | ORAL_TABLET | Freq: Every day | ORAL | Status: DC

## 2013-10-17 NOTE — Telephone Encounter (Signed)
Spoke with pt wife, aware of dr varanasi's recommendations. Script sent into the pharmacy. They will cont to track his bp and let us know of problems

## 2013-11-05 ENCOUNTER — Encounter (HOSPITAL_COMMUNITY): Payer: Self-pay | Admitting: Emergency Medicine

## 2013-11-05 ENCOUNTER — Emergency Department (HOSPITAL_COMMUNITY): Payer: Medicare HMO

## 2013-11-05 ENCOUNTER — Emergency Department (HOSPITAL_COMMUNITY)
Admission: EM | Admit: 2013-11-05 | Discharge: 2013-11-05 | Disposition: A | Discharge status: Home / Self Care | Payer: Medicare HMO | Attending: Emergency Medicine | Admitting: Emergency Medicine

## 2013-11-05 DIAGNOSIS — R509 Fever, unspecified: Secondary | ICD-10-CM | POA: Insufficient documentation

## 2013-11-05 DIAGNOSIS — E785 Hyperlipidemia, unspecified: Secondary | ICD-10-CM | POA: Insufficient documentation

## 2013-11-05 DIAGNOSIS — Z79899 Other long term (current) drug therapy: Secondary | ICD-10-CM | POA: Insufficient documentation

## 2013-11-05 DIAGNOSIS — Z87448 Personal history of other diseases of urinary system: Secondary | ICD-10-CM | POA: Insufficient documentation

## 2013-11-05 DIAGNOSIS — Z792 Long term (current) use of antibiotics: Secondary | ICD-10-CM | POA: Diagnosis not present

## 2013-11-05 DIAGNOSIS — Z720 Tobacco use: Secondary | ICD-10-CM | POA: Insufficient documentation

## 2013-11-05 DIAGNOSIS — I2510 Atherosclerotic heart disease of native coronary artery without angina pectoris: Secondary | ICD-10-CM | POA: Diagnosis not present

## 2013-11-05 DIAGNOSIS — Z7982 Long term (current) use of aspirin: Secondary | ICD-10-CM | POA: Insufficient documentation

## 2013-11-05 DIAGNOSIS — I10 Essential (primary) hypertension: Secondary | ICD-10-CM | POA: Insufficient documentation

## 2013-11-05 DIAGNOSIS — R251 Tremor, unspecified: Secondary | ICD-10-CM | POA: Diagnosis present

## 2013-11-05 LAB — CBC WITH DIFFERENTIAL/PLATELET
BASOS ABS: 0 10*3/uL (ref 0.0–0.1)
Basophils Relative: 0 % (ref 0–1)
Eosinophils Absolute: 0 10*3/uL (ref 0.0–0.7)
Eosinophils Relative: 0 % (ref 0–5)
HCT: 37.9 % — ABNORMAL LOW (ref 39.0–52.0)
Hemoglobin: 13.4 g/dL (ref 13.0–17.0)
LYMPHS PCT: 4 % — AB (ref 12–46)
Lymphs Abs: 0.8 10*3/uL (ref 0.7–4.0)
MCH: 32.3 pg (ref 26.0–34.0)
MCHC: 35.4 g/dL (ref 30.0–36.0)
MCV: 91.3 fL (ref 78.0–100.0)
Monocytes Absolute: 1.1 10*3/uL — ABNORMAL HIGH (ref 0.1–1.0)
Monocytes Relative: 6 % (ref 3–12)
NEUTROS PCT: 90 % — AB (ref 43–77)
Neutro Abs: 17.5 10*3/uL — ABNORMAL HIGH (ref 1.7–7.7)
PLATELETS: 177 10*3/uL (ref 150–400)
RBC: 4.15 MIL/uL — ABNORMAL LOW (ref 4.22–5.81)
RDW: 12.8 % (ref 11.5–15.5)
WBC: 19.4 10*3/uL — AB (ref 4.0–10.5)

## 2013-11-05 LAB — CBG MONITORING, ED: GLUCOSE-CAPILLARY: 123 mg/dL — AB (ref 70–99)

## 2013-11-05 LAB — BASIC METABOLIC PANEL
ANION GAP: 12 (ref 5–15)
BUN: 25 mg/dL — ABNORMAL HIGH (ref 6–23)
CO2: 25 meq/L (ref 19–32)
Calcium: 9.2 mg/dL (ref 8.4–10.5)
Chloride: 103 mEq/L (ref 96–112)
Creatinine, Ser: 1.14 mg/dL (ref 0.50–1.35)
GFR calc Af Amer: 66 mL/min — ABNORMAL LOW (ref >90)
GFR, EST NON AFRICAN AMERICAN: 57 mL/min — AB (ref >90)
GLUCOSE: 162 mg/dL — AB (ref 70–99)
POTASSIUM: 4.5 meq/L (ref 3.7–5.3)
SODIUM: 140 meq/L (ref 137–147)

## 2013-11-05 LAB — URINALYSIS, ROUTINE W REFLEX MICROSCOPIC
Bilirubin Urine: NEGATIVE
GLUCOSE, UA: NEGATIVE mg/dL
Hgb urine dipstick: NEGATIVE
Ketones, ur: 15 mg/dL — AB
LEUKOCYTES UA: NEGATIVE
Nitrite: NEGATIVE
PROTEIN: NEGATIVE mg/dL
Specific Gravity, Urine: 1.018 (ref 1.005–1.030)
Urobilinogen, UA: 1 mg/dL (ref 0.0–1.0)
pH: 5.5 (ref 5.0–8.0)

## 2013-11-05 MED ORDER — ACETAMINOPHEN 325 MG PO TABS
650.0000 mg | ORAL_TABLET | Freq: Once | ORAL | Status: AC
  Administered 2013-11-05: 650 mg via ORAL
  Filled 2013-11-05: qty 2

## 2013-11-05 NOTE — ED Notes (Signed)
Per EMS: Pt started having shaking/ tremors in hands and arms while eating supper this evening, continues to have intermittent tremors. Occasional PVCs on monitor. 12-lead unremarkable. VS WNL. A&O x 4. Ambulatory.

## 2013-11-05 NOTE — ED Notes (Signed)
Bed: WA20 Expected date:  Expected time:  Means of arrival:  Comments: 79M Gen weakness/shaking

## 2013-11-05 NOTE — ED Provider Notes (Signed)
CSN: [id number]     Arrival date & time 11/05/13  2006 History   First MD Initiated Contact with Patient 11/05/13 2023     Chief Complaint  Patient presents with  . Tremors     (Consider location/radiation/quality/duration/timing/severity/associated sxs/prior Treatment) HPI Comments: Patient wife reports that after a dinner tonight he had about one hour of shaking/tremors in his hands and arms. He states that he concentrated it would get somewhat better. He had some mild shaking in his feet. It no abnormal movements of his face. His wife reports that his mental status was unchanged. No bowel or bladder incontinence. He had some cough during the episode. He is otherwise been feeling well recently. Symptoms resolved upon arrival to the emergency department  The history is provided by the patient. No language interpreter was used.    Past Medical History  Diagnosis Date  . Severe aortic stenosis 2010    S/P AVR-TISSURE VALVE  . Coronary artery disease with hx of myocardial infarct w/o hx of CABG 2010  . Hypertension   . Hyperlipidemia   . BPH (benign prostatic hyperplasia)   . Impaired fasting glucose   . Diverticulosis   . Mesenteric panniculitis   . Memory loss   . Retinal tear   . Memory difficulty 10/19/2012   Past Surgical History  Procedure Laterality Date  . Tonsillectomy    . Aortic valve replacement    . 2 vessel cabg    . Bartle    . Cataract extraction, bilateral      RT 2010 LT 2014   Family History  Problem Relation Age of Onset  . Stroke Father   . Cancer Brother    History  Substance Use Topics  . Smoking status: Current Some Day Smoker    Types: Pipe  . Smokeless tobacco: Never Used     Comment: PIPE  . Alcohol Use: 0.6 oz/week    1 Shots of liquor per week     Comment: RARELY    Review of Systems  Constitutional: Positive for chills. Negative for fever, activity change, appetite change and fatigue.  HENT: Negative for congestion, facial swelling,  rhinorrhea and trouble swallowing.   Eyes: Negative for photophobia and pain.  Respiratory: Positive for cough. Negative for chest tightness and shortness of breath.   Cardiovascular: Negative for chest pain and leg swelling.  Gastrointestinal: Negative for nausea, vomiting, abdominal pain, diarrhea and constipation.  Endocrine: Negative for polydipsia and polyuria.  Genitourinary: Negative for dysuria, urgency, decreased urine volume and difficulty urinating.  Musculoskeletal: Negative for back pain and gait problem.  Skin: Negative for color change, rash and wound.  Allergic/Immunologic: Negative for immunocompromised state.  Neurological: Negative for dizziness, facial asymmetry, speech difficulty, weakness, numbness and headaches.  Psychiatric/Behavioral: Negative for confusion, decreased concentration and agitation.      Allergies  Review of patient's allergies indicates no known allergies.  Home Medications   Prior to Admission medications   Medication Sig Start Date End Date Taking? Authorizing Provider  amLODipine (NORVASC) 5 MG tablet Take 1 tablet (5 mg total) by mouth daily. 10/17/13  Yes Jettie Booze, MD  aspirin EC 81 MG tablet Take 81 mg by mouth daily.   Yes Historical Provider, MD  lisinopril (PRINIVIL,ZESTRIL) 20 MG tablet Take 2 tablets (40 mg total) by mouth daily. 10/06/13  Yes Jettie Booze, MD  metoprolol tartrate (LOPRESSOR) 25 MG tablet Take 1 tablet (25 mg total) by mouth 2 (two) times daily. 08/30/13  Yes  Jettie Booze, MD  Multiple Vitamins-Minerals (MULTIVITAMIN WITH MINERALS) tablet Take 1 tablet by mouth daily.   Yes Historical Provider, MD  pravastatin (PRAVACHOL) 20 MG tablet TAKE 1 TABLET BY MOUTH EVERY DAY 08/30/13  Yes Jettie Booze, MD  amoxicillin (AMOXIL) 500 MG capsule Take 500 mg by mouth 3 (three) times daily. PRIOR TO DENTAL PROCEDURE.    Historical Provider, MD   BP 152/55  Pulse 79  Temp(Src) 100.8 F (38.2 C) (Rectal)   Resp 16  SpO2 96% Physical Exam  Constitutional: He is oriented to person, place, and time. He appears well-developed and well-nourished. No distress.  HENT:  Head: Normocephalic and atraumatic.  Mouth/Throat: No oropharyngeal exudate.  Eyes: Pupils are equal, round, and reactive to light.  Neck: Normal range of motion. Neck supple.  Cardiovascular: Normal rate, regular rhythm and normal heart sounds.  Exam reveals no gallop and no friction rub.   No murmur heard. Pulmonary/Chest: Effort normal and breath sounds normal. No respiratory distress. He has no wheezes. He has no rales.  Abdominal: Soft. Bowel sounds are normal. He exhibits no distension and no mass. There is no tenderness. There is no rebound and no guarding.  Musculoskeletal: Normal range of motion. He exhibits no edema and no tenderness.  Neurological: He is alert and oriented to person, place, and time. He has normal strength. He displays no atrophy and no tremor. No cranial nerve deficit or sensory deficit. He exhibits normal muscle tone. He displays a negative Romberg sign. He displays no seizure activity. Coordination and gait normal. GCS eye subscore is 4. GCS verbal subscore is 5. GCS motor subscore is 6.  Skin: Skin is warm and dry.  Psychiatric: He has a normal mood and affect.    ED Course  Procedures (including critical care time) Labs Review Labs Reviewed  CBC WITH DIFFERENTIAL - Abnormal; Notable for the following:    WBC 19.4 (*)    RBC 4.15 (*)    HCT 37.9 (*)    Neutrophils Relative % 90 (*)    Neutro Abs 17.5 (*)    Lymphocytes Relative 4 (*)    Monocytes Absolute 1.1 (*)    All other components within normal limits  BASIC METABOLIC PANEL - Abnormal; Notable for the following:    Glucose, Bld 162 (*)    BUN 25 (*)    GFR calc non Af Amer 57 (*)    GFR calc Af Amer 66 (*)    All other components within normal limits  URINALYSIS, ROUTINE W REFLEX MICROSCOPIC - Abnormal; Notable for the following:     Ketones, ur 15 (*)    All other components within normal limits  CBG MONITORING, ED - Abnormal; Notable for the following:    Glucose-Capillary 123 (*)    All other components within normal limits  URINE CULTURE    Imaging Review Dg Chest 2 View  11/05/2013   CLINICAL DATA:  Shaking and tremors in both hands and arms well eating suffer this evening with intermittent tremors continuing, occasional cardiac pvc's tonight, cough and low-grade temperature, initial evaluation  EXAM: CHEST  2 VIEW  COMPARISON:  06/20/2008  FINDINGS: Mild cardiac enlargement stable. Vascular pattern normal. Replacement cardiac valve stable. Lungs are clear. No effusions. Mild scarring or atelectasis left lung base similar to prior study.  IMPRESSION: No active cardiopulmonary disease.   Electronically Signed   By: Skipper Cliche M.D.   On: 11/05/2013 22:16     EKG Interpretation None  MDM   Final diagnoses:  Fever and chills    Pt is a 80 y.o. male with Pmhx as above who presents with about 1 hrs of generalized shaking of upper extremities without AMS, abnl facial mvmts, incontinence.  His wife reports his hands were very cold at the time, and he had some coughing. Symptoms have resolved.  Denies recent illness or injury. Denies fever, nausea, vomiting, diarrhea, urinary symptoms. On physical exam patient is a low-grade temp. Vital signs are otherwise stable. His neurologic exam is benign he has no current trauma. Episode is not sounds like a seizure and I suspect that the shaking was alright worse. CBC BMP chest x-ray and urine have been ordered.   Chest x-ray and urinalysis are unremarkable.  WBC is 19.4, BUN 25, glucose 162. CBC, BMP otherwise grossly unremarkable. I suspect influenza or viral influenza-like illness. Will recommend close outpatient followup with his PCP as well as supportive care with rest, increase intake of fluids, Tylenol or ibuprofen for pain. Return precautions given for new or  worsening symptoms including trouble breathing, inability to tolerate liquids, confusion, weakness.      Ernestina Patches, MD 11/05/13 2253

## 2013-11-05 NOTE — Discharge Instructions (Signed)
Fever, Adult °A fever is a temperature of 100.4° F (38° C) or above.  °HOME CARE °· Take fever medicine as told by your doctor. Do not  take aspirin for fever if you are younger than 78 years of age. °· If you are given antibiotic medicine, take it as told. Finish the medicine even if you start to feel better. °· Rest. °· Drink enough fluids to keep your pee (urine) clear or pale yellow. Do not drink alcohol. °· Take a bath or shower with room temperature water. Do not use ice water or alcohol sponge baths. °· Wear lightweight, loose clothes. °GET HELP RIGHT AWAY IF:  °· You are short of breath or have trouble breathing. °· You are very weak. °· You are dizzy or you pass out (faint). °· You are very thirsty or are making little or no urine. °· You have new pain. °· You throw up (vomit) or have watery poop (diarrhea). °· You keep throwing up or having watery poop for more than 1 to 2 days. °· You have a stiff neck or light bothers your eyes. °· You have a skin rash. °· You have a fever or problems (symptoms) that last for more than 2 to 3 days. °· You have a fever and your problems quickly get worse. °· You keep throwing up the fluids you drink. °· You do not feel better after 3 days. °· You have new problems. °MAKE SURE YOU:  °· Understand these instructions. °· Will watch your condition. °· Will get help right away if you are not doing well or get worse. °Document Released: 10/16/2007 Document Revised: 03/31/2011 Document Reviewed: 11/07/2010 °Exit[employer]® Patient Information ©2015 [employer]. This information is not intended to replace advice given to you by your health care provider. Make sure you discuss any questions you have with your health care provider. ° °

## 2013-11-05 NOTE — ED Notes (Signed)
[facility] called for transportation home 

## 2013-11-07 LAB — URINE CULTURE
Colony Count: NO GROWTH
Culture: NO GROWTH

## 2013-12-07 ENCOUNTER — Encounter: Payer: Self-pay | Admitting: Neurology

## 2013-12-13 ENCOUNTER — Encounter: Payer: Self-pay | Admitting: Neurology

## 2013-12-25 ENCOUNTER — Other Ambulatory Visit: Payer: Self-pay | Admitting: Interventional Cardiology

## 2014-01-09 ENCOUNTER — Other Ambulatory Visit: Payer: Self-pay

## 2014-01-09 MED ORDER — METOPROLOL TARTRATE 25 MG PO TABS: 25.0000 mg | ORAL_TABLET | Freq: Two times a day (BID) | ORAL | Status: DC

## 2014-01-09 MED ORDER — LISINOPRIL 20 MG PO TABS: 40.0000 mg | ORAL_TABLET | Freq: Every day | ORAL | Status: DC

## 2014-01-09 MED ORDER — PRAVASTATIN SODIUM 20 MG PO TABS: ORAL_TABLET | ORAL | Status: DC

## 2014-01-09 MED ORDER — AMLODIPINE BESYLATE 5 MG PO TABS: 5.0000 mg | ORAL_TABLET | Freq: Every day | ORAL | Status: DC

## 2014-01-31 ENCOUNTER — Ambulatory Visit (INDEPENDENT_AMBULATORY_CARE_PROVIDER_SITE_OTHER): Payer: PPO | Admitting: Interventional Cardiology

## 2014-01-31 ENCOUNTER — Encounter: Payer: Self-pay | Admitting: Interventional Cardiology

## 2014-01-31 VITALS — BP 108/74 | HR 61 | Ht 68.0 in | Wt 164.8 lb

## 2014-01-31 DIAGNOSIS — E782 Mixed hyperlipidemia: Secondary | ICD-10-CM

## 2014-01-31 DIAGNOSIS — I359 Nonrheumatic aortic valve disorder, unspecified: Secondary | ICD-10-CM

## 2014-01-31 DIAGNOSIS — I2510 Atherosclerotic heart disease of native coronary artery without angina pectoris: Secondary | ICD-10-CM

## 2014-01-31 DIAGNOSIS — I10 Essential (primary) hypertension: Secondary | ICD-10-CM

## 2014-01-31 NOTE — Progress Notes (Signed)
Patient ID: [name], male   DOB: 02-05-1926, 81 y.o.   MRN: [id number] Patient ID: [name], male   DOB: 1926-06-23, 80 y.o.   MRN: [id number]    [address], [address], [place]  [address] Phone: [phone] Fax:  [phone]  Date:  01/31/2014   ID:  Allen, Bowman Nov 04, 1926, MRN [id number]  PCP:  Irven Shelling, MD      History of Present Illness: [name] is a 81 y.o. male who had an AVR/CABG in 2010. Blood preesure at the [facility] is usually in the 003-491 range systolic. He exercises and gets his HR to the 100 range. His daughter was diagnosed with lung cancer 4 years ago. She is a nonsmoker and she received chemotherapy.  She is still getting treatment.  There is some stress with that. He has been feeling well and exercising several times a week. CAD/ASCVD:  plays 9 holes of golf when the weather cooperates; [facility]. BP in the 791-505 range systolic at the [facility]. Denies : Chest pain.  Diaphoresis.  Dizziness.  Dyspnea on exertion.  Leg edema.  Nitroglycerin.  Orthopnea.  Palpitations.  Paroxysmal nocturnal dyspnea.  Syncope.   In 10/15, he had an episode of shaking and went to the ER.  No definitive diagnosis was made.  He has felt well since that time.  No dental work at that time.  No other signs of infection.    Wt Readings from Last 3 Encounters:  01/31/14 164 lb 12.8 oz (74.753 kg)  04/25/13 167 lb 8 oz (75.978 kg)  01/25/13 168 lb (76.204 kg)     Past Medical History  Diagnosis Date  . Severe aortic stenosis 2010    S/P AVR-TISSURE VALVE  . Coronary artery disease with hx of myocardial infarct w/o hx of CABG 2010  . Hypertension   . Hyperlipidemia   . BPH (benign prostatic hyperplasia)   . Impaired fasting glucose   . Diverticulosis   . Mesenteric panniculitis   . Memory loss   . Retinal tear   . Memory difficulty 10/19/2012    Current Outpatient Prescriptions    Medication Sig Dispense Refill  . amLODipine (NORVASC) 5 MG tablet Take 1 tablet (5 mg total) by mouth daily. 90 tablet 2  . amoxicillin (AMOXIL) 500 MG capsule Take 500 mg by mouth 3 (three) times daily. PRIOR TO DENTAL PROCEDURE.    Marland Kitchen aspirin EC 81 MG tablet Take 81 mg by mouth daily.    Marland Kitchen lisinopril (PRINIVIL,ZESTRIL) 20 MG tablet Take 2 tablets (40 mg total) by mouth daily. 180 tablet 2  . metoprolol tartrate (LOPRESSOR) 25 MG tablet Take 1 tablet (25 mg total) by mouth 2 (two) times daily. 180 tablet 1  . Multiple Vitamins-Minerals (MULTIVITAMIN WITH MINERALS) tablet Take 1 tablet by mouth daily.    . pravastatin (PRAVACHOL) 20 MG tablet TAKE 1 TABLET BY MOUTH EVERY DAY 90 tablet 1   No current facility-administered medications for this visit.    Allergies:   No Known Allergies  Social History:  The patient  reports that he has been smoking Pipe.  He has never used smokeless tobacco. He reports that he drinks about 0.6 oz of alcohol per week. He reports that he does not use illicit drugs.   Family History:  The patient's family history includes Cancer in his brother; Stroke in his father.   ROS:  Please see  the history of present illness.  No nausea, vomiting.  No fevers, chills.  No focal weakness.  No dysuria.    All other systems reviewed and negative.   PHYSICAL EXAM: VS:  BP 108/74 mmHg  Pulse 61  Ht 5' 8" (1.727 m)  Wt 164 lb 12.8 oz (74.753 kg)  BMI 25.06 kg/m2 Well nourished, well developed, in no acute distress HEENT: normal Neck: no JVD, no carotid bruits Cardiac:  normal S1, S2; RRR;  Lungs:  clear to auscultation bilaterally, no wheezing, rhonchi or rales Abd: soft, nontender, no hepatomegaly Ext: no edema Skin: warm and dry Neuro:   no focal abnormalities noted Psych: normal affect  EKG:  NST, NSST, no change from prior    ASSESSMENT AND PLAN:  Coronary atherosclerosis of native coronary artery  Continue Aspirin Tablet, 81 MG, 1 tablet, Orally, Once a  day LAB: Comp Metabolic Panel (Ordered for 03/03/2012)   GLUCOSE 132 70-99 - mg/dL H  BUN 18 6-26 - mg/dL   CREATININE 0.98 0.60-1.30 - mg/dl   eGFR (NON-AFRICAN AMERICAN) 73 >60 - calc   eGFR (AFRICAN AMERICAN) 88 >60 - calc   SODIUM 140 136-145 - mmol/L   POTASSIUM 4.5 3.5-5.5 - mmol/L   CHLORIDE 107 98-107 - mmol/L   C02 30 22-32 - mmol/L   ANION GAP 7.2 6.0-20.0 - mmol/L   CALCIUM 9.0 8.6-10.3 - mg/dL   T PROTEIN 6.4 6.0-8.3 - g/dL   ALBUMIN 3.8 3.4-4.8 - g/dL   T.BILI 1.0 0.3-1.0 - mg/dL   ALP 53 38-126 - U/L   AST 16 0-39 - U/L   ALT 18 0-52 - U/L    Allen Wish,Bowman 03/01/2012 01:06:12 PM > elevated glucose. otherwise stable kidney function. AllenAmy 03/01/2012 03:20:43 PM > lmom per Dpr. To Dr. Laurann Montana. GRIFFIN,[name] 03/01/2012 05:43:31 PM >   LAB: Lipid Panel w/Direct LDL (Ordered for 03/03/2012)   CHOLESTEROL 124 <200 - mg/dL   TRIG 80 0-199 - mg/dL   DIRECT HDL 39 30-70 - mg/dL   DLDL 72 0-99 - mg/dL   NON-HDL 84 0-129 - mg/dL   CHOL/HDL 3.1 2.0-4.0 - Ratio    Stephanine Reas,Bowman 03/01/2012 01:06:44 PM > well controlled. continue current meds AllenAmy 03/01/2012 03:21:13 PM > lmom per dpr.    s/p CABG.  No angina.  Felt better since the BP meds were adjusted.  Was high at the dentist office.    2. Mixed hyperlipidemia  Continue Pravachol Tablet, 20 MG, 1 tablet, Orally, Once a day  LDL 79 in 3/14 Lipids well controlled last year as above. To be checked in a month or so. LDL 70 in 2013.  Checked at [place] office.    3. Aortic valve disorders  Continue Amoxicillin Tablet, 500 MG, 4 tablet, Orally, 1 hour prior to dental treatment Continue SBE prophylaxis. Heading back to [facility] dental school tomorrow for final dental treatment of current issue.   4. Hypertension, essential  Increased Lisinopril Tablet, 20 MG, 2 tablet, Orally, Once a day, 90, Refills 3 several months ago Continue Metoprolol Tartrate Tablet, 25 MG, 1 tablet, Orally, Twice a day Was elevated. Increase meds a  few months ago and now BP is much better. COntinue to check BP outside of [facility] office. If BP > 140/90 consistently, let us know. Ofered to call in a 40 mg tab of lisinopril but he is not having problems getting his meds.         Signed, Mina Marble, MD, [facility] 01/31/2014 8:24 AM

## 2014-01-31 NOTE — Patient Instructions (Signed)
Your physician recommends that you continue on your current medications as directed. Please refer to the Current Medication list given to you today.  Your physician wants you to follow-up in: 1 year with Dr. Varanasi. You will receive a reminder letter in the mail two months in advance. If you don't receive a letter, please call our office to schedule the follow-up appointment.  

## 2014-04-13 ENCOUNTER — Encounter (HOSPITAL_COMMUNITY): Payer: Self-pay | Admitting: *Deleted

## 2014-04-13 ENCOUNTER — Emergency Department (HOSPITAL_COMMUNITY)
Admission: EM | Admit: 2014-04-13 | Discharge: 2014-04-13 | Disposition: A | Discharge status: Home / Self Care | Payer: PPO | Attending: Emergency Medicine | Admitting: Emergency Medicine

## 2014-04-13 DIAGNOSIS — W01198A Fall on same level from slipping, tripping and stumbling with subsequent striking against other object, initial encounter: Secondary | ICD-10-CM | POA: Insufficient documentation

## 2014-04-13 DIAGNOSIS — Y9389 Activity, other specified: Secondary | ICD-10-CM | POA: Diagnosis not present

## 2014-04-13 DIAGNOSIS — S0181XA Laceration without foreign body of other part of head, initial encounter: Secondary | ICD-10-CM | POA: Insufficient documentation

## 2014-04-13 DIAGNOSIS — Z7982 Long term (current) use of aspirin: Secondary | ICD-10-CM | POA: Insufficient documentation

## 2014-04-13 DIAGNOSIS — I252 Old myocardial infarction: Secondary | ICD-10-CM | POA: Diagnosis not present

## 2014-04-13 DIAGNOSIS — I10 Essential (primary) hypertension: Secondary | ICD-10-CM | POA: Diagnosis not present

## 2014-04-13 DIAGNOSIS — I2510 Atherosclerotic heart disease of native coronary artery without angina pectoris: Secondary | ICD-10-CM | POA: Insufficient documentation

## 2014-04-13 DIAGNOSIS — Z87448 Personal history of other diseases of urinary system: Secondary | ICD-10-CM | POA: Diagnosis not present

## 2014-04-13 DIAGNOSIS — W19XXXA Unspecified fall, initial encounter: Secondary | ICD-10-CM

## 2014-04-13 DIAGNOSIS — Z951 Presence of aortocoronary bypass graft: Secondary | ICD-10-CM | POA: Diagnosis not present

## 2014-04-13 DIAGNOSIS — Z8719 Personal history of other diseases of the digestive system: Secondary | ICD-10-CM | POA: Insufficient documentation

## 2014-04-13 DIAGNOSIS — Z954 Presence of other heart-valve replacement: Secondary | ICD-10-CM | POA: Insufficient documentation

## 2014-04-13 DIAGNOSIS — Z720 Tobacco use: Secondary | ICD-10-CM | POA: Diagnosis not present

## 2014-04-13 DIAGNOSIS — Z79899 Other long term (current) drug therapy: Secondary | ICD-10-CM | POA: Diagnosis not present

## 2014-04-13 DIAGNOSIS — Z8669 Personal history of other diseases of the nervous system and sense organs: Secondary | ICD-10-CM | POA: Insufficient documentation

## 2014-04-13 DIAGNOSIS — E785 Hyperlipidemia, unspecified: Secondary | ICD-10-CM | POA: Insufficient documentation

## 2014-04-13 DIAGNOSIS — Y9289 Other specified places as the place of occurrence of the external cause: Secondary | ICD-10-CM | POA: Insufficient documentation

## 2014-04-13 DIAGNOSIS — Y998 Other external cause status: Secondary | ICD-10-CM | POA: Insufficient documentation

## 2014-04-13 MED ORDER — LIDOCAINE HCL (PF) 1 % IJ SOLN
5.0000 mL | Freq: Once | INTRAMUSCULAR | Status: AC
  Administered 2014-04-13: 5 mL via INTRADERMAL
  Filled 2014-04-13: qty 5

## 2014-04-13 NOTE — Discharge Instructions (Signed)
As discussed, it is important that you follow-up with your primary care physician in one week.  If you develop new, or concerning changes in your condition, return here for further evaluation and management.   Facial Laceration  A facial laceration is a cut on the face. These injuries can be painful and cause bleeding. Lacerations usually heal quickly, but they need special care to reduce scarring. DIAGNOSIS  Your health care provider will take a medical history, ask for details about how the injury occurred, and examine the wound to determine how deep the cut is. TREATMENT  Some facial lacerations may not require closure. Others may not be able to be closed because of an increased risk of infection. The risk of infection and the chance for successful closure will depend on various factors, including the amount of time since the injury occurred. The wound may be cleaned to help prevent infection. If closure is appropriate, pain medicines may be given if needed. Your health care provider will use stitches (sutures), wound glue (adhesive), or skin adhesive strips to repair the laceration. These tools bring the skin edges together to allow for faster healing and a better cosmetic outcome. If needed, you may also be given a tetanus shot. HOME CARE INSTRUCTIONS  Only take over-the-counter or prescription medicines as directed by your health care provider.  Follow your health care provider's instructions for wound care. These instructions will vary depending on the technique used for closing the wound. For Sutures:  Keep the wound clean and dry.   If you were given a bandage (dressing), you should change it at least once a day. Also change the dressing if it becomes wet or dirty, or as directed by your health care provider.   Wash the wound with soap and water 2 times a day. Rinse the wound off with water to remove all soap. Pat the wound dry with a clean towel.   After cleaning, apply a thin  layer of the antibiotic ointment recommended by your health care provider. This will help prevent infection and keep the dressing from sticking.   You may shower as usual after the first 24 hours. Do not soak the wound in water until the sutures are removed.   Get your sutures removed as directed by your health care provider. With facial lacerations, sutures should usually be taken out after 4-5 days to avoid stitch marks.   Wait a few days after your sutures are removed before applying any makeup. For Skin Adhesive Strips:  Keep the wound clean and dry.   Do not get the skin adhesive strips wet. You may bathe carefully, using caution to keep the wound dry.   If the wound gets wet, pat it dry with a clean towel.   Skin adhesive strips will fall off on their own. You may trim the strips as the wound heals. Do not remove skin adhesive strips that are still stuck to the wound. They will fall off in time.  For Wound Adhesive:  You may briefly wet your wound in the shower or bath. Do not soak or scrub the wound. Do not swim. Avoid periods of heavy sweating until the skin adhesive has fallen off on its own. After showering or bathing, gently pat the wound dry with a clean towel.   Do not apply liquid medicine, cream medicine, ointment medicine, or makeup to your wound while the skin adhesive is in place. This may loosen the film before your wound is healed.  If a dressing is placed over the wound, be careful not to apply tape directly over the skin adhesive. This may cause the adhesive to be pulled off before the wound is healed.   Avoid prolonged exposure to sunlight or tanning lamps while the skin adhesive is in place.  The skin adhesive will usually remain in place for 5-10 days, then naturally fall off the skin. Do not pick at the adhesive film.  After Healing: Once the wound has healed, cover the wound with sunscreen during the day for 1 full year. This can help minimize  scarring. Exposure to ultraviolet light in the first year will darken the scar. It can take 1-2 years for the scar to lose its redness and to heal completely.  SEEK IMMEDIATE MEDICAL CARE IF:  You have redness, pain, or swelling around the wound.   You see ayellowish-white fluid (pus) coming from the wound.   You have chills or a fever.  MAKE SURE YOU:  Understand these instructions.  Will watch your condition.  Will get help right away if you are not doing well or get worse. Document Released: 02/14/2004 Document Revised: 10/27/2012 Document Reviewed: 08/19/2012 [facility] Patient Information 2015 [place], [place]. This information is not intended to replace advice given to you by your health care provider. Make sure you discuss any questions you have with your health care provider.

## 2014-04-13 NOTE — ED Notes (Signed)
Bed: [facility] Expected date:  Expected time:  Means of arrival:  Comments: Ems fall/head lac 45ish yo male

## 2014-04-13 NOTE — ED Provider Notes (Signed)
CSN: [id number]     Arrival date & time 04/13/14  1556 History   First MD Initiated Contact with Patient 04/13/14 1603     Chief Complaint  Patient presents with  . Fall  . Head Laceration     (Consider location/radiation/quality/duration/timing/severity/associated sxs/prior Treatment) HPI Patient presents after a mechanical fall with laceration to his left forehead. Patient recalls the entirety of the event.  He states that he was bending over, lost balance, fell 4 striking his forehead on the ground. No loss of consciousness. No subsequent head pain, neck pain, lightheadedness, visual changes, other focal complaints. Patient doesn't take blood thinning medication. Since the event he has had active bleeding from a wound on his left forehead. There is increasing swelling about the area as well, though no substantial pain.  Past Medical History  Diagnosis Date  . Severe aortic stenosis 2010    S/P AVR-TISSURE VALVE  . Coronary artery disease with hx of myocardial infarct w/o hx of CABG 2010  . Hypertension   . Hyperlipidemia   . BPH (benign prostatic hyperplasia)   . Impaired fasting glucose   . Diverticulosis   . Mesenteric panniculitis   . Memory loss   . Retinal tear   . Memory difficulty 10/19/2012   Past Surgical History  Procedure Laterality Date  . Tonsillectomy    . Aortic valve replacement    . 2 vessel cabg    . Bartle    . Cataract extraction, bilateral      RT 2010 LT 2014   Family History  Problem Relation Age of Onset  . Stroke Father   . Cancer Brother    History  Substance Use Topics  . Smoking status: Current Some Day Smoker    Types: Pipe  . Smokeless tobacco: Never Used     Comment: PIPE  . Alcohol Use: 0.6 oz/week    1 Shots of liquor per week     Comment: RARELY    Review of Systems  Eyes: Negative for visual disturbance.  Respiratory: Negative for chest tightness and shortness of breath.   Cardiovascular: Negative for chest pain.    Gastrointestinal: Negative for nausea.  Skin: Positive for wound.  Neurological: Negative for dizziness, syncope, weakness, light-headedness, numbness and headaches.  Hematological: Does not bruise/bleed easily.      Allergies  Review of patient's allergies indicates no known allergies.  Home Medications   Prior to Admission medications   Medication Sig Start Date End Date Taking? Authorizing Provider  amLODipine (NORVASC) 5 MG tablet Take 1 tablet (5 mg total) by mouth daily. 01/09/14  Yes Jettie Booze, MD  aspirin EC 81 MG tablet Take 81 mg by mouth daily.   Yes Historical Provider, MD  lisinopril (PRINIVIL,ZESTRIL) 20 MG tablet Take 2 tablets (40 mg total) by mouth daily. 01/09/14  Yes Jettie Booze, MD  metoprolol tartrate (LOPRESSOR) 25 MG tablet Take 1 tablet (25 mg total) by mouth 2 (two) times daily. 01/09/14  Yes Jettie Booze, MD  Multiple Vitamins-Minerals (MULTIVITAMIN WITH MINERALS) tablet Take 1 tablet by mouth daily.   Yes Historical Provider, MD  pravastatin (PRAVACHOL) 20 MG tablet TAKE 1 TABLET BY MOUTH EVERY DAY 01/09/14  Yes Jettie Booze, MD  amoxicillin (AMOXIL) 500 MG capsule Take 500 mg by mouth 3 (three) times daily as needed (dental procedure). PRIOR TO DENTAL PROCEDURE.    Historical Provider, MD   BP 163/56 mmHg  Pulse 64  Temp(Src) 98.2 F (36.8 C) (Oral)  Resp 16  SpO2 100% Physical Exam  Constitutional: He is oriented to person, place, and time. He appears well-developed. No distress.  HENT:  Head: Normocephalic and atraumatic.    Eyes: Conjunctivae and EOM are normal. Pupils are equal, round, and reactive to light.  Neck: No spinous process tenderness and no muscular tenderness present. No rigidity. No edema, no erythema and normal range of motion present.  Cardiovascular: Normal rate and regular rhythm.   Pulmonary/Chest: Effort normal. No stridor. No respiratory distress.  Abdominal: He exhibits no distension.   Musculoskeletal: He exhibits no edema.  Neurological: He is alert and oriented to person, place, and time.  Skin: Skin is warm and dry.  Psychiatric: He has a normal mood and affect.  Nursing note and vitals reviewed.   ED Course  Procedures (including critical care time) LACERATION REPAIR Performed by: Carmin Muskrat Authorized by: Carmin Muskrat Consent: Verbal consent obtained. Risks and benefits: risks, benefits and alternatives were discussed Consent given by: patient Patient identity confirmed: provided demographic data Prepped and Draped in normal sterile fashion Wound explored  Laceration Location: L forehead  Laceration Length: 3.5cm  No Foreign Bodies seen or palpated  Anesthesia: local infiltration  Local anesthetic: lidocaine 1% no epinephrine  Anesthetic total: 4 ml  Irrigation method: syringe Amount of cleaning: standard  Skin closure: 6-0 sutures  Number of sutures: 3  Technique: close  Patient tolerance: Patient tolerated the procedure well with no immediate complications.   MDM   Final diagnoses:  Fall, initial encounter  Laceration of forehead, initial encounter    Patient presents after mechanical fall with no syncope, no neurologic complaints, no neurologic findings. Patient laceration repair without complication. Patient is not on blood thinning medication, and with no head pain, neurologic complaints or findings, there is low suspicion for intracranial hemorrhage.     Carmin Muskrat, MD 04/13/14 [phone]

## 2014-04-13 NOTE — ED Notes (Signed)
md at bedside

## 2014-04-13 NOTE — ED Notes (Signed)
Pt escorted to discharge window. Pt verbalized understanding discharge instructions. In no acute distress.  

## 2014-04-13 NOTE — ED Notes (Signed)
Per EMS, pt fell while trying to pick up a newspaper. Pt hit his forehead, has a 1in laceration to left forehead. Pt denies loss of consciousness, is not on blood thinners.

## 2014-04-25 ENCOUNTER — Ambulatory Visit (INDEPENDENT_AMBULATORY_CARE_PROVIDER_SITE_OTHER): Payer: PPO | Admitting: Neurology

## 2014-04-25 ENCOUNTER — Encounter: Payer: Self-pay | Admitting: Neurology

## 2014-04-25 VITALS — BP 126/52 | HR 52 | Ht 68.0 in | Wt 164.4 lb

## 2014-04-25 DIAGNOSIS — R413 Other amnesia: Secondary | ICD-10-CM | POA: Diagnosis not present

## 2014-04-25 NOTE — Progress Notes (Signed)
Reason for visit: Memory disorder  [name] is an 81 y.o. male  History of present illness:  [name] is an 81 year old right-handed white male with a history of a mild memory disorder. The patient comes back today indicating that he believes that there has been very little change in his memory since he was last seen one year ago. The patient is caring for his wife who has fasciculoscapulohumeral dystrophy. The patient still operates a motor vehicle without difficulty. He has not given up any activities of daily living secondary to memory issues. He is suffering from some depression associated with the illness of his wife, and with his children. All of his children have the muscular dystrophy, and one daughter has lung cancer. The patient was seen in the emergency room in the fall of 2015 with a brief episode of generalized tremors, this resolved, and was associated with a fever. The patient returns to the office today for an evaluation. He does not wish to go on any medications for memory.  Past Medical History  Diagnosis Date  . Severe aortic stenosis 2010    S/P AVR-TISSURE VALVE  . Coronary artery disease with hx of myocardial infarct w/o hx of CABG 2010  . Hypertension   . Hyperlipidemia   . BPH (benign prostatic hyperplasia)   . Impaired fasting glucose   . Diverticulosis   . Mesenteric panniculitis   . Memory loss   . Retinal tear   . Memory difficulty 10/19/2012  . HOH (hard of hearing)     hearing aids    Past Surgical History  Procedure Laterality Date  . Tonsillectomy    . Aortic valve replacement    . 2 vessel cabg    . Bartle    . Cataract extraction, bilateral      RT 2010 LT 2014    Family History  Problem Relation Age of Onset  . Stroke Father   . Cancer Brother     Social history:  reports that he has been smoking Pipe.  He has never used smokeless tobacco. He reports that he does not drink alcohol or use illicit drugs.   No Known  Allergies  Medications:  Prior to Admission medications   Medication Sig Start Date End Date Taking? Authorizing Provider  amLODipine (NORVASC) 5 MG tablet Take 1 tablet (5 mg total) by mouth daily. 01/09/14  Yes Jettie Booze, MD  amoxicillin (AMOXIL) 500 MG capsule Take 500 mg by mouth 3 (three) times daily as needed (dental procedure). PRIOR TO DENTAL PROCEDURE.   Yes Historical Provider, MD  aspirin EC 81 MG tablet Take 81 mg by mouth daily.   Yes Historical Provider, MD  lisinopril (PRINIVIL,ZESTRIL) 20 MG tablet Take 2 tablets (40 mg total) by mouth daily. 01/09/14  Yes Jettie Booze, MD  metoprolol tartrate (LOPRESSOR) 25 MG tablet Take 1 tablet (25 mg total) by mouth 2 (two) times daily. 01/09/14  Yes Jettie Booze, MD  Multiple Vitamins-Minerals (MULTIVITAMIN WITH MINERALS) tablet Take 1 tablet by mouth daily.   Yes Historical Provider, MD  pravastatin (PRAVACHOL) 20 MG tablet TAKE 1 TABLET BY MOUTH EVERY DAY 01/09/14  Yes Jettie Booze, MD    ROS:  Out of a complete 14 system review of symptoms, the patient complains only of the following symptoms, and all other reviewed systems are negative.  Double vision, blurred vision Acting out dreams Dizziness  Blood pressure 126/52, pulse 52, height 5\' 8"  (1.727 m),  weight 164 lb 6.4 oz (74.571 kg).  Physical Exam  General: The patient is alert and cooperative at the time of the examination.  Skin: No significant peripheral edema is noted.   Neurologic Exam  Mental status: The patient is alert and oriented x 3 at the time of the examination. The patient has apparent normal recent and remote memory, with an apparently normal attention span and concentration ability. The Mini-Mental Status Examination done today shows a total score of 29/30.   Cranial nerves: Facial symmetry is present. Speech is normal, no aphasia or dysarthria is noted. Extraocular movements are full. Visual fields are full.  Motor: The  patient has good strength in all 4 extremities.  Sensory examination: Soft touch sensation is symmetric on the face, arms, and legs.  Coordination: The patient has good finger-nose-finger and heel-to-shin bilaterally.  Gait and station: The patient has a normal gait. Tandem gait is minimally unsteady. Romberg is negative. No drift is seen.  Reflexes: Deep tendon reflexes are symmetric.   Assessment/Plan:  1. Minimum cognitive impairment  The patient is doing quite well at this time with his underlying memory issues. I have recommended that he may try coconut oil as a source of a long chain fatty acids for memory. He does not wish to go on prescription medications. The patient will follow-up in one year. There may be some underlying problems with depression, this needs to be monitored.  Jill Alexanders MD 04/25/2014 9:26 PM  [facility] [address] [place], [place] [address]  Phone [phone] Fax [phone]

## 2014-06-17 ENCOUNTER — Other Ambulatory Visit: Payer: Self-pay | Admitting: Interventional Cardiology

## 2014-08-08 ENCOUNTER — Other Ambulatory Visit: Payer: Self-pay | Admitting: Interventional Cardiology

## 2014-08-25 ENCOUNTER — Other Ambulatory Visit: Payer: Self-pay

## 2014-08-25 ENCOUNTER — Other Ambulatory Visit: Payer: Self-pay | Admitting: Interventional Cardiology

## 2014-08-25 MED ORDER — PRAVASTATIN SODIUM 20 MG PO TABS: ORAL_TABLET | ORAL | Status: DC

## 2014-09-15 ENCOUNTER — Encounter: Payer: Self-pay | Admitting: Podiatry

## 2014-09-15 ENCOUNTER — Ambulatory Visit (INDEPENDENT_AMBULATORY_CARE_PROVIDER_SITE_OTHER): Payer: PPO | Admitting: Podiatry

## 2014-09-15 VITALS — BP 139/65 | HR 47 | Resp 16 | Ht 66.0 in | Wt 160.0 lb

## 2014-09-15 DIAGNOSIS — B351 Tinea unguium: Secondary | ICD-10-CM

## 2014-09-15 DIAGNOSIS — M79676 Pain in unspecified toe(s): Secondary | ICD-10-CM | POA: Diagnosis not present

## 2014-09-15 NOTE — Progress Notes (Signed)
   Subjective:    Patient ID: DANIS PEMBLETON, male    DOB: [birth year], 81 y.o.   MRN: [id number]  HPI  81 year old male presents the office today with complaints of thick, painful, elongated toenails. He states that he had nail fungus treatment 20 years ago but did not help. He denies any redness or drainage the nails. If his nails to cause pain in shoe gear and pressure. Denies any redness or drainage. He has unable to trim the nails himself. No other complaints at this time.   Review of Systems  Skin: Positive for color change.  Neurological: Positive for light-headedness.  Psychiatric/Behavioral: Positive for confusion.  All other systems reviewed and are negative.      Objective:   Physical Exam AAO x3, NAD DP/PT pulses palpable bilaterally, CRT less than 3 seconds Protective sensation intact with Simms Weinstein monofilament, vibratory sensation intact, Achilles tendon reflex intact Nails are hypertrophic, dystrophic, brittle, discolored, elongated. There is tenderness palpation of Nails 1-5 bilaterally. There is no surrounding erythema or drainage. Noother areas of tenderness to bilateral lower extremities. MMT 5/5, ROM WNL.  No open lesions or pre-ulcerative lesions.  No overlying edema, erythema, increase in warmth to bilateral lower extremities.  No pain with calf compression, swelling, warmth, erythema bilaterally.      Assessment & Plan:  81 year old male with symptomatic onychomycosis -Treatment options discussed including all alternatives, risks, and complications -Nail sharply debrided 10 without complications/bleeding. -Discussed various treatment options for onychomycosis. At this time his left over-the-counter treatment. I discussed with the nail. -Discussed daily foot inspection. -Follow-up 3 months or sooner if any problems arise. In the meantime, encouraged to call the office with any questions, concerns, change in symptoms.   Celesta Gentile, DPM

## 2014-09-15 NOTE — Patient Instructions (Signed)
Over the counter treatment for fungus is called fungi-nail

## 2014-09-20 ENCOUNTER — Encounter: Payer: Self-pay | Admitting: Podiatry

## 2014-11-04 ENCOUNTER — Other Ambulatory Visit: Payer: Self-pay | Admitting: Interventional Cardiology

## 2014-12-08 ENCOUNTER — Other Ambulatory Visit: Payer: Self-pay | Admitting: Interventional Cardiology

## 2014-12-15 ENCOUNTER — Ambulatory Visit: Payer: PPO | Admitting: Podiatry

## 2014-12-18 ENCOUNTER — Encounter: Payer: Self-pay | Admitting: Podiatry

## 2014-12-18 ENCOUNTER — Ambulatory Visit (INDEPENDENT_AMBULATORY_CARE_PROVIDER_SITE_OTHER): Payer: PPO | Admitting: Podiatry

## 2014-12-18 DIAGNOSIS — M79676 Pain in unspecified toe(s): Secondary | ICD-10-CM | POA: Diagnosis not present

## 2014-12-18 DIAGNOSIS — B351 Tinea unguium: Secondary | ICD-10-CM

## 2014-12-18 NOTE — Progress Notes (Signed)
Patient ID: [name], male   DOB: September 01, 1926, 81 y.o.   MRN: [id number]  Subjective: 81 y.o. returns the office today for painful, elongated, thickened toenails which they cannot trim themself. Denies any redness or drainage around the nails. Denies any acute changes since last appointment and no new complaints today. Denies any systemic complaints such as fevers, chills, nausea, vomiting.   Objective: AAO 3, NAD DP/PT pulses palpable, CRT less than 3 seconds Protective sensation intact with Simms Weinstein monofilament Nails hypertrophic, dystrophic, elongated, brittle, discolored 10. There is tenderness overlying the nails 1-5 bilaterally. There is no surrounding erythema or drainage along the nail sites. No open lesions or pre-ulcerative lesions are identified. No other areas of tenderness bilateral lower extremities. No overlying edema, erythema, increased warmth. No pain with calf compression, swelling, warmth, erythema.  Assessment: Patient presents with symptomatic onychomycosis  Plan: -Treatment options including alternatives, risks, complications were discussed -Nails sharply debrided 10 without complication/bleeding. -Discussed daily foot inspection. If there are any changes, to call the office immediately.  -Follow-up in 3 months or sooner if any problems are to arise. In the meantime, encouraged to call the office with any questions, concerns, changes symptoms.  Celesta Gentile, DPM

## 2015-01-16 ENCOUNTER — Other Ambulatory Visit: Payer: Self-pay | Admitting: *Deleted

## 2015-01-16 MED ORDER — PRAVASTATIN SODIUM 20 MG PO TABS: ORAL_TABLET | ORAL | Status: DC

## 2015-01-16 MED ORDER — METOPROLOL TARTRATE 25 MG PO TABS: 25.0000 mg | ORAL_TABLET | Freq: Two times a day (BID) | ORAL | Status: DC

## 2015-01-16 MED ORDER — AMLODIPINE BESYLATE 5 MG PO TABS: 5.0000 mg | ORAL_TABLET | Freq: Every day | ORAL | Status: DC

## 2015-01-16 MED ORDER — LISINOPRIL 20 MG PO TABS: 40.0000 mg | ORAL_TABLET | Freq: Every day | ORAL | Status: DC

## 2015-01-23 ENCOUNTER — Other Ambulatory Visit: Payer: Self-pay | Admitting: *Deleted

## 2015-01-23 NOTE — Telephone Encounter (Signed)
wanted a year, cant do until his appt in feb 2017, explained this and he expressed understanding

## 2015-03-07 NOTE — Progress Notes (Signed)
Patient ID: [name], male   DOB: Jan 01, 1927, 82 y.o.   MRN: [id number]     Cardiology Office Note   Date:  03/08/2015   ID:  IFEANYICHUKWU LOPES, DOB 1926-03-15, MRN [id number]  PCP:  Irven Shelling, MD    No chief complaint on file.    Wt Readings from Last 3 Encounters:  03/08/15 161 lb (73.029 kg)  09/15/14 160 lb (72.576 kg)  04/25/14 164 lb 6.4 oz (74.571 kg)       History of Present Illness: [name] is a 82 y.o. male  who had an AVR/CABG in 2010. Blood preesure at the [facility] is usually in the [id number] range systolic. THis is higher than before.   He exercises and gets his HR to the 100 range. His daughter was diagnosed with lung cancer 5 years ago and there is some stress with that. He has been feeling well and exercising several times a week except for the tremor episode noted below. CAD/ASCVD:  plays 9 holes of golf when the weather cooperates; [facility].  Denies : Chest pain.  Diaphoresis.  Dizziness.  Dyspnea on exertion.  Leg edema.  Nitroglycerin.  Orthopnea.  Palpitations.  Paroxysmal nocturnal dyspnea.  Syncope.   In 10/15, he had an episode of shaking and went to the ER. No definitive diagnosis was made. He has felt well since that time. No dental work at that time. No other signs of infection.   He had another episode of transient unilateral hand shaking 2 weeks ago (Jan 2017), that was shorter and less severe than the 2015 episode.  It resolved after 2 aspirin.  No further episodes.     Past Medical History  Diagnosis Date  . Severe aortic stenosis 2010    S/P AVR-TISSURE VALVE  . Coronary artery disease with hx of myocardial infarct w/o hx of CABG 2010  . Hypertension   . Hyperlipidemia   . BPH (benign prostatic hyperplasia)   . Impaired fasting glucose   . Diverticulosis   . Mesenteric panniculitis ([place])   . Memory loss   . Retinal tear   . Memory difficulty 10/19/2012  . HOH (hard  of hearing)     hearing aids    Past Surgical History  Procedure Laterality Date  . Tonsillectomy    . Aortic valve replacement    . 2 vessel cabg    . Bartle    . Cataract extraction, bilateral      RT 2010 LT 2014     Current Outpatient Prescriptions  Medication Sig Dispense Refill  . amLODipine (NORVASC) 5 MG tablet Take 1 tablet (5 mg total) by mouth daily. 30 tablet 0  . amoxicillin (AMOXIL) 500 MG capsule Take 500 mg by mouth 3 (three) times daily as needed (dental procedure). PRIOR TO DENTAL PROCEDURE.    Marland Kitchen aspirin EC 81 MG tablet Take 81 mg by mouth daily.    Marland Kitchen lisinopril (PRINIVIL,ZESTRIL) 20 MG tablet Take 2 tablets (40 mg total) by mouth daily. 60 tablet 0  . metoprolol tartrate (LOPRESSOR) 25 MG tablet Take 1 tablet (25 mg total) by mouth 2 (two) times daily. 60 tablet 0  . Multiple Vitamins-Minerals (MULTIVITAMIN WITH MINERALS) tablet Take 1 tablet by mouth daily.    . pravastatin (PRAVACHOL) 20 MG tablet TAKE 1 TABLET BY MOUTH EVERY DAY 30 tablet 0   No current facility-administered medications for this visit.    Allergies:  Review of patient's allergies indicates no known allergies.    Social History:  The patient  reports that he has been smoking Pipe.  He has never used smokeless tobacco. He reports that he does not drink alcohol or use illicit drugs.   Family History:  The patient's family history includes Cancer in his brother; Hypertension in his brother; Stroke in his father. There is no history of Heart attack.    ROS:  Please see the history of present illness.   Otherwise, review of systems are positive for one brief episode of tremor.   All other systems are reviewed and negative.    PHYSICAL EXAM: VS:  BP 150/80 mmHg  Pulse 54  Ht 5\' 6"  (1.676 m)  Wt 161 lb (73.029 kg)  BMI 26.00 kg/m2 , BMI Body mass index is 26 kg/(m^2). GEN: Well nourished, well developed, in no acute distress HEENT: normal Neck: no JVD, carotid bruits, or masses Cardiac:  RRR; no murmurs, rubs, or gallops,no edema  Respiratory:  clear to auscultation bilaterally, normal work of breathing GI: soft, nontender, nondistended, + BS MS: no deformity or atrophy Skin: warm and dry, no rash Neuro:  Strength and sensation are intact Psych: euthymic mood, full affect   EKG:   The ekg ordered today demonstrates sinus bradycardia, no signficant ST segment changes   Recent Labs: No results found for requested labs within last 365 days.   Lipid Panel No results found for: CHOL, TRIG, HDL, CHOLHDL, VLDL, LDLCALC, LDLDIRECT   Other studies Reviewed: Additional studies/ records that were reviewed today with results demonstrating: prior cath and operative reslts noted; bioprosthetic AVR.   ASSESSMENT AND PLAN:  1. CAD: Status post bypass surgery. No angina. Continue regular exercise. Continue aggressive secondary prevention. 2. AVR: Doing well many years after bioprosthetic aortic valve placement. Continue with SBE prophylaxis for dental visits. 3. HTN:  Lisinopril has been increased to 40 mg in the past. Today, will Increase amlodipine to 10 mg daily as blood pressure is somewhat higher than would like to see it. Also, with stopping metoprolol, his blood pressure may increase. 4. Bradycardia:  Will stop metoprolol.   5. Hyperlipidemia: COntinue pravstatin.  Checked with Dr. Laurann Montana.   Current medicines are reviewed at length with the patient today.  The patient concerns regarding his medicines were addressed.  The following changes have been made:  Increase amlodipine, stop metoprolol  Labs/ tests ordered today include:  No orders of the defined types were placed in this encounter.    Recommend 150 minutes/week of aerobic exercise Low fat, low carb, high fiber diet recommended  Disposition:   FU in one year   Teresita Madura., MD  03/08/2015 9:02 AM    [facility] [place], [place], [place]  [address] Phone: [phone]; Fax: [phone]

## 2015-03-08 ENCOUNTER — Ambulatory Visit (INDEPENDENT_AMBULATORY_CARE_PROVIDER_SITE_OTHER): Payer: PPO | Admitting: Interventional Cardiology

## 2015-03-08 ENCOUNTER — Encounter: Payer: Self-pay | Admitting: Interventional Cardiology

## 2015-03-08 VITALS — BP 150/80 | HR 54 | Ht 66.0 in | Wt 161.0 lb

## 2015-03-08 DIAGNOSIS — I10 Essential (primary) hypertension: Secondary | ICD-10-CM | POA: Diagnosis not present

## 2015-03-08 DIAGNOSIS — I2510 Atherosclerotic heart disease of native coronary artery without angina pectoris: Secondary | ICD-10-CM | POA: Diagnosis not present

## 2015-03-08 DIAGNOSIS — R001 Bradycardia, unspecified: Secondary | ICD-10-CM

## 2015-03-08 DIAGNOSIS — E782 Mixed hyperlipidemia: Secondary | ICD-10-CM | POA: Diagnosis not present

## 2015-03-08 DIAGNOSIS — I359 Nonrheumatic aortic valve disorder, unspecified: Secondary | ICD-10-CM | POA: Diagnosis not present

## 2015-03-08 MED ORDER — AMLODIPINE BESYLATE 10 MG PO TABS: 10.0000 mg | ORAL_TABLET | Freq: Every day | ORAL | Status: DC

## 2015-03-08 MED ORDER — PRAVASTATIN SODIUM 20 MG PO TABS: ORAL_TABLET | ORAL | Status: DC

## 2015-03-08 MED ORDER — LISINOPRIL 20 MG PO TABS: 40.0000 mg | ORAL_TABLET | Freq: Every day | ORAL | Status: DC

## 2015-03-08 NOTE — Patient Instructions (Signed)
Medication Instructions:  Stop taking Metoprolol and increase Amlodipine to 10 mg daily  Labwork: None  Testing/Procedures: None  Follow-Up: Your physician wants you to follow-up in: 1 year. You will receive a reminder letter in the mail two months in advance. If you don't receive a letter, please call our office to schedule the follow-up appointment.     If you need a refill on your cardiac medications before your next appointment, please call your pharmacy.

## 2015-03-19 ENCOUNTER — Encounter: Payer: Self-pay | Admitting: Podiatry

## 2015-03-19 ENCOUNTER — Ambulatory Visit (INDEPENDENT_AMBULATORY_CARE_PROVIDER_SITE_OTHER): Payer: PPO | Admitting: Podiatry

## 2015-03-19 DIAGNOSIS — B351 Tinea unguium: Secondary | ICD-10-CM | POA: Diagnosis not present

## 2015-03-19 DIAGNOSIS — M79676 Pain in unspecified toe(s): Secondary | ICD-10-CM | POA: Diagnosis not present

## 2015-03-19 NOTE — Progress Notes (Signed)
Patient ID: [name], male   DOB: 1926-05-29, 82 y.o.   MRN: [id number]  Subjective: 82 y.o. returns the office today for painful, elongated, thickened toenails which he cannot trim himself. Denies any redness or drainage around the nails. Denies any acute changes since last appointment and no new complaints today. Denies any systemic complaints such as fevers, chills, nausea, vomiting.   Objective: AAO 3, NAD DP/PT pulses palpable, CRT less than 3 seconds Protective sensation intact with Simms Weinstein monofilament Nails hypertrophic, dystrophic, elongated, brittle, discolored 10. There is tenderness overlying the nails 1-5 bilaterally. There is no surrounding erythema or drainage along the nail sites. No open lesions or pre-ulcerative lesions are identified. No other areas of tenderness bilateral lower extremities. No overlying edema, erythema, increased warmth. No pain with calf compression, swelling, warmth, erythema.  Assessment: Patient presents with symptomatic onychomycosis  Plan: -Treatment options including alternatives, risks, complications were discussed -Nails sharply debrided 10 without complication/bleeding. Continue fungi-nail.  -Discussed daily foot inspection. If there are any changes, to call the office immediately.  -Follow-up in 3 months or sooner if any problems are to arise. In the meantime, encouraged to call the office with any questions, concerns, changes symptoms.  Celesta Gentile, DPM

## 2015-04-18 ENCOUNTER — Telehealth: Payer: Self-pay | Admitting: Interventional Cardiology

## 2015-04-18 NOTE — Telephone Encounter (Signed)
New Message:  Pt's wife is calling in stating that since Dr. Irish Lack changed the pt's medications he hasn't been the same. His extremely fatigue, change in appeitite and his nose is constantly running. Please f/u with her

## 2015-04-19 NOTE — Telephone Encounter (Signed)
**Note De-Identified  Obfuscation** The pts wife states that the pt has been fatigued, weak, has little to no appetite, feeling cold, coughing and has a runny nose for a bout a week. She wants to know if these symptoms are due to the medication changes that were made at his last OV with Dr Irish Lack on 03/08/15. At that [place] he was advised to stop taking Metoprolol and to increase his Amlodipine to 10 mg daily.  She is advised that some of these s/s maybe due to his Lisinopril and/or Amlodipine but some of the other symptoms are not. I advised her to also contact the pts PCP and she stated that she has and that she has scheduled him an appointment with his PCP for Monday 04/23/15. She also states that the pt seems to be feeling better today.  She is advised to call us back if the pts s/s continue and if his PCP thinks that his symptoms are related to his medications. She verbalized understanding and is in agreement with plan.

## 2015-04-23 DIAGNOSIS — I10 Essential (primary) hypertension: Secondary | ICD-10-CM | POA: Diagnosis not present

## 2015-04-23 DIAGNOSIS — L989 Disorder of the skin and subcutaneous tissue, unspecified: Secondary | ICD-10-CM | POA: Diagnosis not present

## 2015-04-23 DIAGNOSIS — R739 Hyperglycemia, unspecified: Secondary | ICD-10-CM | POA: Diagnosis not present

## 2015-04-23 DIAGNOSIS — R5383 Other fatigue: Secondary | ICD-10-CM | POA: Diagnosis not present

## 2015-04-23 DIAGNOSIS — J310 Chronic rhinitis: Secondary | ICD-10-CM | POA: Diagnosis not present

## 2015-04-25 ENCOUNTER — Ambulatory Visit (INDEPENDENT_AMBULATORY_CARE_PROVIDER_SITE_OTHER): Payer: PPO | Admitting: Neurology

## 2015-04-25 ENCOUNTER — Encounter: Payer: Self-pay | Admitting: Neurology

## 2015-04-25 VITALS — BP 97/56 | HR 71 | Ht 66.0 in | Wt 161.5 lb

## 2015-04-25 DIAGNOSIS — R413 Other amnesia: Secondary | ICD-10-CM

## 2015-04-25 DIAGNOSIS — R5382 Chronic fatigue, unspecified: Secondary | ICD-10-CM

## 2015-04-25 DIAGNOSIS — M791 Myalgia: Secondary | ICD-10-CM | POA: Diagnosis not present

## 2015-04-25 DIAGNOSIS — M609 Myositis, unspecified: Secondary | ICD-10-CM

## 2015-04-25 DIAGNOSIS — E538 Deficiency of other specified B group vitamins: Secondary | ICD-10-CM | POA: Diagnosis not present

## 2015-04-25 DIAGNOSIS — IMO0001 Reserved for inherently not codable concepts without codable children: Secondary | ICD-10-CM
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
## 2015-04-26 LAB — TSH: TSH: 0.992 u[IU]/mL (ref 0.450–4.500)

## 2015-04-26 LAB — B. BURGDORFI ANTIBODIES: Lyme IgG/IgM Ab: <0.91 {ISR} (ref 0.00–0.90)

## 2015-04-26 LAB — SEDIMENTATION RATE: Sed Rate: 38 mm/hr — ABNORMAL HIGH (ref 0–30)

## 2015-04-26 LAB — VITAMIN B12: Vitamin B-12: 768 pg/mL (ref 211–946)

## 2015-04-27 ENCOUNTER — Telehealth: Payer: Self-pay | Admitting: *Deleted

## 2015-04-27 NOTE — Telephone Encounter (Signed)
-----   Message from Kathrynn Ducking, MD sent at 04/26/2015  5:03 PM EDT ----- Blood work is unremarkable exception of a mild elevation in sedimentation rate, this can be followed. Please call the patient.  ----- Message -----    From: [facility] Lab Results In Interface    Sent: 04/26/2015   7:43 AM      To: Kathrynn Ducking, MD

## 2015-04-27 NOTE — Telephone Encounter (Signed)
Spoke to pt, relayed lab results.   He verbalized understanding.

## 2015-04-29 ENCOUNTER — Emergency Department (HOSPITAL_COMMUNITY): Payer: PPO

## 2015-04-29 ENCOUNTER — Inpatient Hospital Stay (HOSPITAL_COMMUNITY)
Admission: EM | Admit: 2015-04-29 | Discharge: 2015-05-08 | DRG: 871 | Disposition: A | Discharge status: Skilled Nursing Facility | Payer: PPO | Attending: Internal Medicine | Admitting: Internal Medicine

## 2015-04-29 ENCOUNTER — Encounter (HOSPITAL_COMMUNITY): Payer: Self-pay | Admitting: *Deleted

## 2015-04-29 DIAGNOSIS — E785 Hyperlipidemia, unspecified: Secondary | ICD-10-CM | POA: Diagnosis present

## 2015-04-29 DIAGNOSIS — N183 Chronic kidney disease, stage 3 (moderate): Secondary | ICD-10-CM | POA: Diagnosis present

## 2015-04-29 DIAGNOSIS — Z9889 Other specified postprocedural states: Secondary | ICD-10-CM

## 2015-04-29 DIAGNOSIS — R0602 Shortness of breath: Secondary | ICD-10-CM | POA: Diagnosis not present

## 2015-04-29 DIAGNOSIS — D7589 Other specified diseases of blood and blood-forming organs: Secondary | ICD-10-CM | POA: Diagnosis present

## 2015-04-29 DIAGNOSIS — N179 Acute kidney failure, unspecified: Secondary | ICD-10-CM | POA: Diagnosis not present

## 2015-04-29 DIAGNOSIS — F17200 Nicotine dependence, unspecified, uncomplicated: Secondary | ICD-10-CM | POA: Diagnosis present

## 2015-04-29 DIAGNOSIS — E669 Obesity, unspecified: Secondary | ICD-10-CM | POA: Diagnosis present

## 2015-04-29 DIAGNOSIS — J918 Pleural effusion in other conditions classified elsewhere: Secondary | ICD-10-CM | POA: Diagnosis present

## 2015-04-29 DIAGNOSIS — I359 Nonrheumatic aortic valve disorder, unspecified: Secondary | ICD-10-CM | POA: Diagnosis present

## 2015-04-29 DIAGNOSIS — J189 Pneumonia, unspecified organism: Secondary | ICD-10-CM | POA: Diagnosis present

## 2015-04-29 DIAGNOSIS — Z6826 Body mass index (BMI) 26.0-26.9, adult: Secondary | ICD-10-CM

## 2015-04-29 DIAGNOSIS — Z823 Family history of stroke: Secondary | ICD-10-CM

## 2015-04-29 DIAGNOSIS — I252 Old myocardial infarction: Secondary | ICD-10-CM

## 2015-04-29 DIAGNOSIS — R627 Adult failure to thrive: Secondary | ICD-10-CM | POA: Diagnosis not present

## 2015-04-29 DIAGNOSIS — R740 Nonspecific elevation of levels of transaminase and lactic acid dehydrogenase [LDH]: Secondary | ICD-10-CM | POA: Diagnosis present

## 2015-04-29 DIAGNOSIS — E860 Dehydration: Secondary | ICD-10-CM | POA: Diagnosis not present

## 2015-04-29 DIAGNOSIS — J90 Pleural effusion, not elsewhere classified: Secondary | ICD-10-CM | POA: Insufficient documentation

## 2015-04-29 DIAGNOSIS — R0600 Dyspnea, unspecified: Secondary | ICD-10-CM | POA: Diagnosis not present

## 2015-04-29 DIAGNOSIS — Z8249 Family history of ischemic heart disease and other diseases of the circulatory system: Secondary | ICD-10-CM

## 2015-04-29 DIAGNOSIS — I10 Essential (primary) hypertension: Secondary | ICD-10-CM | POA: Diagnosis not present

## 2015-04-29 DIAGNOSIS — J9811 Atelectasis: Secondary | ICD-10-CM | POA: Diagnosis not present

## 2015-04-29 DIAGNOSIS — Z952 Presence of prosthetic heart valve: Secondary | ICD-10-CM

## 2015-04-29 DIAGNOSIS — D649 Anemia, unspecified: Secondary | ICD-10-CM | POA: Diagnosis present

## 2015-04-29 DIAGNOSIS — I2510 Atherosclerotic heart disease of native coronary artery without angina pectoris: Secondary | ICD-10-CM | POA: Diagnosis present

## 2015-04-29 DIAGNOSIS — I959 Hypotension, unspecified: Secondary | ICD-10-CM | POA: Diagnosis not present

## 2015-04-29 DIAGNOSIS — J869 Pyothorax without fistula: Secondary | ICD-10-CM

## 2015-04-29 DIAGNOSIS — A419 Sepsis, unspecified organism: Principal | ICD-10-CM | POA: Diagnosis present

## 2015-04-29 DIAGNOSIS — Z4682 Encounter for fitting and adjustment of non-vascular catheter: Secondary | ICD-10-CM

## 2015-04-29 DIAGNOSIS — I129 Hypertensive chronic kidney disease with stage 1 through stage 4 chronic kidney disease, or unspecified chronic kidney disease: Secondary | ICD-10-CM | POA: Diagnosis present

## 2015-04-29 DIAGNOSIS — Z951 Presence of aortocoronary bypass graft: Secondary | ICD-10-CM

## 2015-04-29 DIAGNOSIS — R030 Elevated blood-pressure reading, without diagnosis of hypertension: Secondary | ICD-10-CM | POA: Diagnosis not present

## 2015-04-29 DIAGNOSIS — Z7982 Long term (current) use of aspirin: Secondary | ICD-10-CM | POA: Diagnosis not present

## 2015-04-29 DIAGNOSIS — H9190 Unspecified hearing loss, unspecified ear: Secondary | ICD-10-CM | POA: Diagnosis not present

## 2015-04-29 DIAGNOSIS — Z720 Tobacco use: Secondary | ICD-10-CM

## 2015-04-29 DIAGNOSIS — Z809 Family history of malignant neoplasm, unspecified: Secondary | ICD-10-CM | POA: Diagnosis not present

## 2015-04-29 DIAGNOSIS — R05 Cough: Secondary | ICD-10-CM | POA: Diagnosis not present

## 2015-04-29 DIAGNOSIS — N400 Enlarged prostate without lower urinary tract symptoms: Secondary | ICD-10-CM | POA: Diagnosis not present

## 2015-04-29 DIAGNOSIS — J948 Other specified pleural conditions: Secondary | ICD-10-CM | POA: Diagnosis not present

## 2015-04-29 DIAGNOSIS — J9600 Acute respiratory failure, unspecified whether with hypoxia or hypercapnia: Secondary | ICD-10-CM

## 2015-04-29 DIAGNOSIS — R6520 Severe sepsis without septic shock: Secondary | ICD-10-CM | POA: Diagnosis present

## 2015-04-29 DIAGNOSIS — E782 Mixed hyperlipidemia: Secondary | ICD-10-CM | POA: Diagnosis present

## 2015-04-29 LAB — CBC WITH DIFFERENTIAL/PLATELET
Basophils Absolute: 0 10*3/uL (ref 0.0–0.1)
Basophils Relative: 0 %
EOS ABS: 0 10*3/uL (ref 0.0–0.7)
EOS PCT: 0 %
HCT: 32.1 % — ABNORMAL LOW (ref 39.0–52.0)
HEMOGLOBIN: 11.1 g/dL — AB (ref 13.0–17.0)
LYMPHS ABS: 1.1 10*3/uL (ref 0.7–4.0)
LYMPHS PCT: 4 %
MCH: 30.2 pg (ref 26.0–34.0)
MCHC: 34.6 g/dL (ref 30.0–36.0)
MCV: 87.5 fL (ref 78.0–100.0)
MONO ABS: 1.4 10*3/uL — AB (ref 0.1–1.0)
MONOS PCT: 5 %
Neutro Abs: 28.1 10*3/uL — ABNORMAL HIGH (ref 1.7–7.7)
Neutrophils Relative %: 92 %
Platelets: 457 10*3/uL — ABNORMAL HIGH (ref 150–400)
RBC: 3.67 MIL/uL — AB (ref 4.22–5.81)
RDW: 12.8 % (ref 11.5–15.5)
WBC: 30.7 10*3/uL — AB (ref 4.0–10.5)

## 2015-04-29 LAB — URINALYSIS, ROUTINE W REFLEX MICROSCOPIC
Bilirubin Urine: NEGATIVE
Glucose, UA: 100 mg/dL — AB
HGB URINE DIPSTICK: NEGATIVE
Ketones, ur: NEGATIVE mg/dL
Nitrite: NEGATIVE
PH: 5 (ref 5.0–8.0)
Protein, ur: NEGATIVE mg/dL
SPECIFIC GRAVITY, URINE: 1.019 (ref 1.005–1.030)

## 2015-04-29 LAB — COMPREHENSIVE METABOLIC PANEL
ALBUMIN: 2.9 g/dL — AB (ref 3.5–5.0)
ALK PHOS: 92 U/L (ref 38–126)
ALT: 24 U/L (ref 17–63)
AST: 25 U/L (ref 15–41)
Anion gap: 11 (ref 5–15)
BUN: 34 mg/dL — ABNORMAL HIGH (ref 6–20)
CALCIUM: 8.6 mg/dL — AB (ref 8.9–10.3)
CO2: 22 mmol/L (ref 22–32)
CREATININE: 1.91 mg/dL — AB (ref 0.61–1.24)
Chloride: 101 mmol/L (ref 101–111)
GFR calc Af Amer: 35 mL/min — ABNORMAL LOW (ref >60)
GFR calc non Af Amer: 30 mL/min — ABNORMAL LOW (ref >60)
Glucose, Bld: 205 mg/dL — ABNORMAL HIGH (ref 65–99)
Potassium: 4.2 mmol/L (ref 3.5–5.1)
SODIUM: 134 mmol/L — AB (ref 135–145)
Total Bilirubin: 1.2 mg/dL (ref 0.3–1.2)
Total Protein: 7.4 g/dL (ref 6.5–8.1)

## 2015-04-29 LAB — INFLUENZA PANEL BY PCR (TYPE A & B)
H1N1 flu by pcr: NOT DETECTED
INFLAPCR: NEGATIVE
Influenza B By PCR: NEGATIVE

## 2015-04-29 LAB — I-STAT CG4 LACTIC ACID, ED: Lactic Acid, Venous: 3.47 mmol/L (ref 0.5–2.0)

## 2015-04-29 LAB — URINE MICROSCOPIC-ADD ON

## 2015-04-29 LAB — I-STAT TROPONIN, ED: TROPONIN I, POC: 0.02 ng/mL (ref 0.00–0.08)

## 2015-04-29 LAB — LACTIC ACID, PLASMA
LACTIC ACID, VENOUS: 2.2 mmol/L — AB (ref 0.5–2.0)
Lactic Acid, Venous: 1.6 mmol/L (ref 0.5–2.0)

## 2015-04-29 LAB — PROCALCITONIN: Procalcitonin: 0.51 ng/mL

## 2015-04-29 LAB — TSH: TSH: 1.373 u[IU]/mL (ref 0.350–4.500)

## 2015-04-29 LAB — PROTIME-INR
INR: 1.72 — AB (ref 0.00–1.49)
Prothrombin Time: 19.5 seconds — ABNORMAL HIGH (ref 11.6–15.2)

## 2015-04-29 LAB — APTT: APTT: 46 s — AB (ref 24–37)

## 2015-04-29 MED ORDER — DM-GUAIFENESIN ER 30-600 MG PO TB12
2.0000 | ORAL_TABLET | Freq: Two times a day (BID) | ORAL | Status: DC
  Administered 2015-04-29 – 2015-05-08 (×18): 2 via ORAL
  Filled 2015-04-29 (×4): qty 2
  Filled 2015-04-29: qty 1
  Filled 2015-04-29 (×9): qty 2
  Filled 2015-04-29: qty 1
  Filled 2015-04-29: qty 2
  Filled 2015-04-29: qty 1
  Filled 2015-04-29 (×2): qty 2

## 2015-04-29 MED ORDER — SODIUM CHLORIDE 0.9 % IV BOLUS (SEPSIS)
1000.0000 mL | Freq: Once | INTRAVENOUS | Status: AC
  Administered 2015-04-29: 1000 mL via INTRAVENOUS

## 2015-04-29 MED ORDER — ADULT MULTIVITAMIN W/MINERALS CH
1.0000 | ORAL_TABLET | Freq: Every day | ORAL | Status: DC
  Administered 2015-04-30 – 2015-05-08 (×9): 1 via ORAL
  Filled 2015-04-29 (×9): qty 1

## 2015-04-29 MED ORDER — PRAVASTATIN SODIUM 20 MG PO TABS
20.0000 mg | ORAL_TABLET | Freq: Every day | ORAL | Status: DC
  Administered 2015-04-30 – 2015-05-07 (×8): 20 mg via ORAL
  Filled 2015-04-29 (×8): qty 1

## 2015-04-29 MED ORDER — AMLODIPINE BESYLATE 10 MG PO TABS
10.0000 mg | ORAL_TABLET | Freq: Every day | ORAL | Status: DC
  Administered 2015-05-01 – 2015-05-08 (×8): 10 mg via ORAL
  Filled 2015-04-29 (×9): qty 1

## 2015-04-29 MED ORDER — DEXTROSE 5 % IV SOLN
1.0000 g | INTRAVENOUS | Status: DC
  Administered 2015-04-30: 1 g via INTRAVENOUS
  Filled 2015-04-29 (×2): qty 10

## 2015-04-29 MED ORDER — SODIUM CHLORIDE 0.9 % IV SOLN
INTRAVENOUS | Status: DC
  Administered 2015-04-29 – 2015-04-30 (×3): via INTRAVENOUS

## 2015-04-29 MED ORDER — SODIUM CHLORIDE 0.9 % IV BOLUS (SEPSIS)
250.0000 mL | Freq: Once | INTRAVENOUS | Status: AC
  Administered 2015-04-29: 250 mL via INTRAVENOUS

## 2015-04-29 MED ORDER — DEXTROSE 5 % IV SOLN
1.0000 g | Freq: Once | INTRAVENOUS | Status: AC
  Administered 2015-04-29: 1 g via INTRAVENOUS
  Filled 2015-04-29: qty 10

## 2015-04-29 MED ORDER — ASPIRIN EC 81 MG PO TBEC
81.0000 mg | DELAYED_RELEASE_TABLET | Freq: Every day | ORAL | Status: DC
  Administered 2015-04-30 – 2015-05-08 (×9): 81 mg via ORAL
  Filled 2015-04-29 (×9): qty 1

## 2015-04-29 MED ORDER — DEXTROSE 5 % IV SOLN
500.0000 mg | INTRAVENOUS | Status: AC
  Administered 2015-04-30 – 2015-05-06 (×7): 500 mg via INTRAVENOUS
  Filled 2015-04-29 (×8): qty 500

## 2015-04-29 MED ORDER — TRAMADOL HCL 50 MG PO TABS
50.0000 mg | ORAL_TABLET | Freq: Two times a day (BID) | ORAL | Status: DC | PRN
  Administered 2015-04-29 – 2015-05-05 (×2): 50 mg via ORAL
  Filled 2015-04-29 (×2): qty 1

## 2015-04-29 MED ORDER — DEXTROSE 5 % IV SOLN
500.0000 mg | Freq: Once | INTRAVENOUS | Status: AC
  Administered 2015-04-29: 500 mg via INTRAVENOUS
  Filled 2015-04-29: qty 500

## 2015-04-29 NOTE — ED Notes (Signed)
Per EMS report: pt coming from home and presents to the ED for not feeling well for the past few weeks. A few weeks ago pt saw the MD and found that pt had an increased WBC and took pt off Lopressor b/c he had a decreased kidney function.  PTAR was originally at the house to take pt's wife but saw that pt didn't look well and found orthostatic changes.  EMS was called for pt. Pt only c/o of a cough that causes him pain in his chest when he coughs.

## 2015-04-29 NOTE — H&P (Signed)
[facility] History and Physical  [name] [id number] DOB: [birth year] DOA: 04/29/2015  Referring physician: Mr. Rico Sheehan EDP PCP: Irven Shelling, MD  Specialists:   Chief Complaint: Cough, shortness of breath, fatigue  HPI: [name] is a 82 y.o. male  With a history of hypertension, hyperlipidemia, aortic stenosis without repair, that presented to the emergency department with complaints of shortness of breath, and cough along with generalized weakness. Symptoms started a couple weeks ago however resolved after a few days. One week later, patient started to have cough and feeling weak. Patient also complains of pleuritic chest pain associated with his cough. He denies any recent fever or chills, abdominal pain, nausea or vomiting, diarrhea or constipation. Patient states that his cough is dry. He also states he has had decreased oral intake over the last couple of days. In the emergency department, chest x-ray did show pneumonia with large left pleural effusion. Patient found to have leukocytosis of 30 with an elevated lactic acid. [facility] called for admission.  Review of Systems:  Constitutional: Denies fever, chills, diaphoresis, appetite change and fatigue.  HEENT: Denies photophobia, eye pain, redness, hearing loss, ear pain, congestion, sore throat, rhinorrhea, sneezing, mouth sores, trouble swallowing, neck pain, neck stiffness and tinnitus.   Respiratory: Complains of shortness of breath, cough. Cardiovascular: Denies chest pain, palpitations and leg swelling.  Gastrointestinal: Denies nausea, vomiting, abdominal pain, diarrhea, constipation, blood in stool and abdominal distention.  Genitourinary: Denies dysuria, urgency, frequency, hematuria, flank pain and difficulty urinating.  Musculoskeletal: Denies myalgias, back pain, joint swelling, arthralgias and gait problem.  Skin: Denies pallor, rash and wound.  Neurological: Complains of generalized  weakness. Hematological: Denies adenopathy. Easy bruising, personal or family bleeding history  Psychiatric/Behavioral: Denies suicidal ideation, mood changes, confusion, nervousness, sleep disturbance and agitation  Past Medical History  Diagnosis Date  . Severe aortic stenosis 2010    S/P AVR-TISSURE VALVE  . Coronary artery disease with hx of myocardial infarct w/o hx of CABG 2010  . Hypertension   . Hyperlipidemia   . BPH (benign prostatic hyperplasia)   . Impaired fasting glucose   . Diverticulosis   . 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 Memory loss   . Retinal tear   . Memory difficulty 10/19/2012  . HOH (hard of hearing)     hearing aids   Past Surgical History  Procedure Laterality Date  . Tonsillectomy    . Aortic valve replacement    . 2 vessel cabg    . Bartle    . Cataract extraction, bilateral      RT 2010 LT 2014   Social History:  reports that he has been smoking Pipe.  He has never used smokeless tobacco. He reports that he does not drink alcohol or use illicit drugs.  No Known Allergies  Family History  Problem Relation Age of Onset  . Stroke Father   . Cancer Brother   . Heart attack Neg Hx   . Hypertension Brother     Prior to Admission medications   Medication Sig Start Date End Date Taking? Authorizing Provider  amLODipine (NORVASC) 10 MG tablet Take 1 tablet (10 mg total) by mouth daily. 03/08/15  Yes Jettie Booze, MD  amoxicillin (AMOXIL) 500 MG capsule Take 500 mg by mouth 3 (three) times daily as needed (dental procedure). PRIOR TO DENTAL PROCEDURE.   Yes Historical Provider, MD  aspirin EC 81 MG tablet Take 81 mg by mouth daily.   Yes Historical Provider,  MD  Multiple Vitamins-Minerals (MULTIVITAMIN WITH MINERALS) tablet Take 1 tablet by mouth daily.   Yes Historical Provider, MD  pravastatin (PRAVACHOL) 20 MG tablet TAKE 1 TABLET BY MOUTH EVERY DAY 03/08/15  Yes Jettie Booze, MD  lisinopril (PRINIVIL,ZESTRIL) 20 MG tablet Take 2  tablets (40 mg total) by mouth daily. Patient not taking: Reported on 04/25/2015 03/08/15   Jettie Booze, MD   Physical Exam: Filed Vitals:   04/29/15 1600 04/29/15 1630  BP: 125/64 137/65  Pulse: 76 73  Temp:    Resp: 23 25     General: Well developed, well nourished, NAD, appears stated age  HEENT: NCAT, PERRLA, EOMI, Anicteic Sclera, mucous membranes dry.   Neck: Supple, no JVD, no masses  Cardiovascular: S1 S2 auscultated, 2/6SEM, Regular rate and rhythm.  Respiratory: Diminished breath sounds, L>R. +Rales  Abdomen: Soft, nontender, nondistended, + bowel sounds  Extremities: warm dry without cyanosis clubbing. +1LE edema B/L  Neuro: AAOx3, cranial nerves grossly intact. Strength 5/5 in patient's upper and lower extremities bilaterally  Skin: Without rashes exudates or nodules  Psych: Normal affect and demeanor with intact judgement and insight  Labs on Admission:  Basic Metabolic Panel:  Recent Labs Lab 04/29/15 1429  NA 134*  K 4.2  CL 101  CO2 22  GLUCOSE 205*  BUN 34*  CREATININE 1.91*  CALCIUM 8.6*   Liver Function Tests:  Recent Labs Lab 04/29/15 1429  AST 25  ALT 24  ALKPHOS 92  BILITOT 1.2  PROT 7.4  ALBUMIN 2.9*   No results for input(s): LIPASE, AMYLASE in the last 168 hours. No results for input(s): AMMONIA in the last 168 hours. CBC:  Recent Labs Lab 04/29/15 1429  WBC 30.7*  NEUTROABS 28.1*  HGB 11.1*  HCT 32.1*  MCV 87.5  PLT 457*   Cardiac Enzymes: No results for input(s): CKTOTAL, CKMB, CKMBINDEX, TROPONINI in the last 168 hours.  BNP (last 3 results) No results for input(s): BNP in the last 8760 hours.  ProBNP (last 3 results) No results for input(s): PROBNP in the last 8760 hours.  CBG: No results for input(s): GLUCAP in the last 168 hours.  Radiological Exams on Admission: Dg Chest 2 View  04/29/2015  CLINICAL DATA:  82 year old with recent onset of cough and delays. EXAM: CHEST  2 VIEW COMPARISON:   11/05/2013 and earlier. FINDINGS: Prior sternotomy for aortic valve replacement and CABG. Cardiac silhouette likely normal in size, partially obscured by a large left pleural effusion. Associated dense consolidation in the left lower lobe and left upper lobe. Right lung clear. Pulmonary vascularity normal. No right pleural effusion. Degenerative changes and DISH involving the thoracic spine. IMPRESSION: Large left pleural effusion and associated dense passive atelectasis and/or pneumonia in the left lower lobe and left upper lobe. Electronically Signed   By: Evangeline Dakin M.D.   On: 04/29/2015 14:53    EKG: Independently reviewed. Sinus rhythm, rate 82  Assessment/Plan  Severe Sepsis secondary to community acquired pneumonia with large left pleural effusion -Upon admission, patient had leukocytosis with an elevated lactic acid level, acute kidney injury -Chest x-ray showed large left pleural effusion and associated dense passive atelectasis and/or pneumonia of the left lower lobe and left upper lobe -Pneumonia and sepsis order sets utilized -Blood cultures, sputum culture, strep pneumonia urine antigen ordered -Ultrasound-guided thoracentesis ordered, with labs -Continue to monitor lactic acid and CBC level -Placed on azithromycin and ceftriaxone, Mucinex -Pending influenza PCR. Have not started patient on Tamiflu if symptoms  started over a week ago.   Dyspnea -Likely secondary to pleural effusion and pneumonia.  -Currently maintaining oxygen saturations in the high 90s.  -Continue to monitor. Expect dyspnea to improve with thoracentesis.   Acute on chronic kidney disease, stage III -Likely secondary to sepsis and dehydration -Creatinine on admission 1.91, baseline 1-1.1  -Will give gentle IVF, continue to monitor BMP   Generalized weakness -Secondary to the above -Continue to monitor. Patient usually very active and plays golf and still drives. Does not use a cane or walker for  ambulation.  Normocytic anemia  -Hemoglobin currently stable, continue monitor CBC   Essential hypertension -Continue amlodipine  Hyperlipidemia -Continue statin  History of aortic stenosis with our placement -Stable, continue aspirin  Tobacco abuse -Patient counseled on smoking cessation. -Has not smoked his pipe in a week and a half. -Declined nicotine patch  DVT prophylaxis: SCDs  Code Status: Full  Condition: Guarded  Family Communication: None at bedside. Admission, patients condition and plan of care including tests being ordered have been discussed with the patient, who indicates understanding and agrees with the plan and Code Status.  Disposition Plan: Admitted.   Time spent: 65 minutes  Dandra Velardi D.O. [facility] Pager [phone]  If 7PM-7AM, please contact night-coverage [email or web address] Password [facility] 04/29/2015, 4:48 PM

## 2015-04-29 NOTE — ED Provider Notes (Signed)
CSN: [id number]     Arrival date & time 04/29/15  1344 History   First MD Initiated Contact with Patient 04/29/15 1457     Chief Complaint  Patient presents with  . Weakness     (Consider location/radiation/quality/duration/timing/severity/associated sxs/prior Treatment) HPI Comments: Patient with PMH of HTN, HL, and aortic valve replacement presents to the ED with a chief complaint of fatigue.  Patient states that he has felt out of energy, and fatigued for the past several days.  He states that he had a cough and cold for the past 2 weeks.  He states that he has been noticing improvement of his cough, but worsening chest discomfort.  States that the chest pain worsens when he coughs.  He reports having some shortness of breath for the past few days.  He denies any associated fever.  He stays at home with his wife and has not been hospitalized recently.  He denies any abdominal pain, nausea, vomiting, diarrhea, constipation, or dysuria.  There are no modifying factors.  The history is provided by the patient. No language interpreter was used.    Past Medical History  Diagnosis Date  . Severe aortic stenosis 2010    S/P AVR-TISSURE VALVE  . Coronary artery disease with hx of myocardial infarct w/o hx of CABG 2010  . Hypertension   . Hyperlipidemia   . BPH (benign prostatic hyperplasia)   . Impaired fasting glucose   . Diverticulosis   . 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 Memory loss   . Retinal tear   . Memory difficulty 10/19/2012  . HOH (hard of hearing)     hearing aids   Past Surgical History  Procedure Laterality Date  . Tonsillectomy    . Aortic valve replacement    . 2 vessel cabg    . Bartle    . Cataract extraction, bilateral      RT 2010 LT 2014   Family History  Problem Relation Age of Onset  . Stroke Father   . Cancer Brother   . Heart attack Neg Hx   . Hypertension Brother    Social History  Substance Use Topics  . Smoking status: Current Some Day Smoker     Types: Pipe  . Smokeless tobacco: Never Used     Comment: PIPE  . Alcohol Use: No     Comment: RARELY    Review of Systems  Constitutional: Negative for fever and chills.  Respiratory: Positive for cough and shortness of breath.   Cardiovascular: Positive for chest pain.  Gastrointestinal: Negative for nausea, vomiting, diarrhea and constipation.  Genitourinary: Negative for dysuria.  All other systems reviewed and are negative.     Allergies  Review of patient's allergies indicates no known allergies.  Home Medications   Prior to Admission medications   Medication Sig Start Date End Date Taking? Authorizing Provider  amLODipine (NORVASC) 10 MG tablet Take 1 tablet (10 mg total) by mouth daily. 03/08/15  Yes Jettie Booze, MD  amoxicillin (AMOXIL) 500 MG capsule Take 500 mg by mouth 3 (three) times daily as needed (dental procedure). PRIOR TO DENTAL PROCEDURE.   Yes Historical Provider, MD  aspirin EC 81 MG tablet Take 81 mg by mouth daily.   Yes Historical Provider, MD  Multiple Vitamins-Minerals (MULTIVITAMIN WITH MINERALS) tablet Take 1 tablet by mouth daily.   Yes Historical Provider, MD  pravastatin (PRAVACHOL) 20 MG tablet TAKE 1 TABLET BY MOUTH EVERY DAY 03/08/15  Yes Jettie Booze, MD  lisinopril (PRINIVIL,ZESTRIL) 20 MG tablet Take 2 tablets (40 mg total) by mouth daily. Patient not taking: Reported on 04/25/2015 03/08/15   Jettie Booze, MD   BP 128/56 mmHg  Pulse 83  Temp(Src) 98.1 F (36.7 C) (Oral)  Resp 18  SpO2 94% Physical Exam  Constitutional: He is oriented to person, place, and time. He appears well-developed and well-nourished. No distress.  HENT:  Head: Normocephalic.  Right Ear: External ear normal.  Left Ear: External ear normal.  Mildly erythematous, no tonsillar exudate, no abscess, no stridor, uvula is midline  TMs clear bilaterally  Eyes: Conjunctivae and EOM are normal. Pupils are equal, round, and reactive to light.  Neck:  Normal range of motion. Neck supple.  Cardiovascular: Normal rate, regular rhythm and normal heart sounds.  Exam reveals no gallop and no friction rub.   No murmur heard. Pulmonary/Chest: Effort normal. No stridor. No respiratory distress. He has no wheezes. He has rales. He exhibits no tenderness.  Rales in lower right, diminished lung sounds in left  Abdominal: Soft. Bowel sounds are normal. He exhibits no distension. There is no tenderness.  Musculoskeletal: Normal range of motion. He exhibits no tenderness.  Neurological: He is alert and oriented to person, place, and time.  Skin: Skin is warm and dry. No rash noted. He is not diaphoretic.  Psychiatric: He has a normal mood and affect. His behavior is normal. Judgment and thought content normal.  Nursing note and vitals reviewed.   ED Course  Procedures (including critical care time) Labs Review Labs Reviewed  I-STAT CG4 LACTIC ACID, ED - Abnormal; Notable for the following:    Lactic Acid, Venous 3.47 (*)    All other components within normal limits  COMPREHENSIVE METABOLIC PANEL  CBC WITH DIFFERENTIAL/PLATELET  URINALYSIS, ROUTINE W REFLEX MICROSCOPIC (NOT AT [facility])  TSH  Randolm Idol, ED    Imaging Review Dg Chest 2 View  04/29/2015  CLINICAL DATA:  82 year old with recent onset of cough and delays. EXAM: CHEST  2 VIEW COMPARISON:  11/05/2013 and earlier. FINDINGS: Prior sternotomy for aortic valve replacement and CABG. Cardiac silhouette likely normal in size, partially obscured by a large left pleural effusion. Associated dense consolidation in the left lower lobe and left upper lobe. Right lung clear. Pulmonary vascularity normal. No right pleural effusion. Degenerative changes and DISH involving the thoracic spine. IMPRESSION: Large left pleural effusion and associated dense passive atelectasis and/or pneumonia in the left lower lobe and left upper lobe. Electronically Signed   By: Evangeline Dakin M.D.   On: 04/29/2015  14:53   I have personally reviewed and evaluated these images and lab results as part of my medical decision-making.   EKG Interpretation   Date/Time:  Sunday April 29 2015 13:55:57 EDT Ventricular Rate:  82 PR Interval:  150 QRS Duration: 95 QT Interval:  370 QTC Calculation: 432 R Axis:   40 Text Interpretation:  Sinus rhythm Borderline low voltage, extremity leads  No significant change since last tracing Confirmed by KNOTT MD, Quillian Quince  [id number]) on 04/29/2015 2:08:38 PM Also confirmed by KNOTT MD, DANIEL [phone]),  editor Gilford Rile, CCT, [place] ([id number])  on 04/29/2015 2:43:02 PM      MDM   Final diagnoses:  CAP (community acquired pneumonia)    Patient with worsening weakness over the past couple of days.  He has been recovering from a URI.  Stays at home with his wife.  VSS.  Lactate is elevated to 3.47.  Giving fluids.  CXR remarkable  for large left pleural effusion and/or pneumonia.  Suspicious for pneumonia because the patient has had a recent URI and cough.  Will treat with azithro and rocephin.  Patient seen by and discussed with Dr. Zenia Resides.  4:26 PM Appreciate Dr. Ree Kida for admitting the patient.  Will add influenza panel per her recommendations.    Montine Circle, PA-C 04/29/15 1628  Lacretia Leigh, MD 04/29/15 [phone]

## 2015-04-29 NOTE — ED Notes (Signed)
Bed: [id number] Expected date:  Expected time:  Means of arrival:  Comments: EMS- 82 yo dizziness

## 2015-04-30 ENCOUNTER — Inpatient Hospital Stay (HOSPITAL_COMMUNITY): Payer: PPO

## 2015-04-30 ENCOUNTER — Encounter (HOSPITAL_COMMUNITY): Payer: Self-pay | Admitting: Radiology

## 2015-04-30 ENCOUNTER — Telehealth: Payer: Self-pay | Admitting: Neurology

## 2015-04-30 DIAGNOSIS — N179 Acute kidney failure, unspecified: Secondary | ICD-10-CM | POA: Diagnosis not present

## 2015-04-30 DIAGNOSIS — J189 Pneumonia, unspecified organism: Secondary | ICD-10-CM | POA: Diagnosis not present

## 2015-04-30 DIAGNOSIS — R091 Pleurisy: Secondary | ICD-10-CM | POA: Diagnosis not present

## 2015-04-30 DIAGNOSIS — A419 Sepsis, unspecified organism: Secondary | ICD-10-CM | POA: Diagnosis not present

## 2015-04-30 DIAGNOSIS — J869 Pyothorax without fistula: Secondary | ICD-10-CM | POA: Diagnosis not present

## 2015-04-30 DIAGNOSIS — I10 Essential (primary) hypertension: Secondary | ICD-10-CM | POA: Diagnosis not present

## 2015-04-30 DIAGNOSIS — J90 Pleural effusion, not elsewhere classified: Secondary | ICD-10-CM | POA: Diagnosis not present

## 2015-04-30 LAB — CBC
HEMATOCRIT: 27.8 % — AB (ref 39.0–52.0)
Hemoglobin: 9.9 g/dL — ABNORMAL LOW (ref 13.0–17.0)
MCH: 30.9 pg (ref 26.0–34.0)
MCHC: 35.6 g/dL (ref 30.0–36.0)
MCV: 86.9 fL (ref 78.0–100.0)
PLATELETS: 400 10*3/uL (ref 150–400)
RBC: 3.2 MIL/uL — ABNORMAL LOW (ref 4.22–5.81)
RDW: 12.8 % (ref 11.5–15.5)
WBC: 31.5 10*3/uL — AB (ref 4.0–10.5)

## 2015-04-30 LAB — PROTEIN, BODY FLUID: TOTAL PROTEIN, FLUID: 4.5 g/dL

## 2015-04-30 LAB — BODY FLUID CELL COUNT WITH DIFFERENTIAL
Eos, Fluid: 1 %
LYMPHS FL: 8 %
MONOCYTE-MACROPHAGE-SEROUS FLUID: 1 % — AB (ref 50–90)
NEUTROPHIL FLUID: 90 % — AB (ref 0–25)
Total Nucleated Cell Count, Fluid: 12483 cu mm — ABNORMAL HIGH (ref 0–1000)

## 2015-04-30 LAB — BASIC METABOLIC PANEL
ANION GAP: 4 — AB (ref 5–15)
BUN: 30 mg/dL — ABNORMAL HIGH (ref 6–20)
CALCIUM: 8.4 mg/dL — AB (ref 8.9–10.3)
CO2: 19 mmol/L — AB (ref 22–32)
CREATININE: 1.52 mg/dL — AB (ref 0.61–1.24)
Chloride: 112 mmol/L — ABNORMAL HIGH (ref 101–111)
GFR, EST AFRICAN AMERICAN: 46 mL/min — AB (ref >60)
GFR, EST NON AFRICAN AMERICAN: 40 mL/min — AB (ref >60)
Glucose, Bld: 146 mg/dL — ABNORMAL HIGH (ref 65–99)
Potassium: 4.8 mmol/L (ref 3.5–5.1)
SODIUM: 135 mmol/L (ref 135–145)

## 2015-04-30 LAB — GLUCOSE, SEROUS FLUID

## 2015-04-30 LAB — LACTATE DEHYDROGENASE: LDH: 100 U/L (ref 98–192)

## 2015-04-30 LAB — GRAM STAIN

## 2015-04-30 LAB — STREP PNEUMONIAE URINARY ANTIGEN: STREP PNEUMO URINARY ANTIGEN: NEGATIVE

## 2015-04-30 LAB — HIV ANTIBODY (ROUTINE TESTING W REFLEX): HIV SCREEN 4TH GENERATION: NONREACTIVE

## 2015-04-30 LAB — LACTATE DEHYDROGENASE, PLEURAL OR PERITONEAL FLUID: LD FL: 300 U/L — AB (ref 3–23)

## 2015-04-30 NOTE — Telephone Encounter (Signed)
I called the patient. The patient was admitted for a pneumonia on the left side, with a large pleural effusion. The patient had presented initially with nonspecific complaints such as fatigue, achiness, runny nose. No symptoms of cough, chest pain, shortness of breath. No symptoms of fever or chills.  The wife also indicated that they never called her about the lab results, this has been documented, the patient was called on April 7 at 11:00 in the morning, lab results were discussed with the patient.

## 2015-04-30 NOTE — Progress Notes (Addendum)
CRITICAL VALUE ALERT  Critical value received:  Positive blood cultures.  Date of notification:  04/30/15  Time of notification:  1235  Critical value read back:Yes.    Nurse who received alert:  Cordella Register, RN  MD notified (1st page):  Dr. Aileen Fass  Time of first page:  1244  MD notified (2nd page):  Time of second page:  Responding MD:  Dr. Aileen Fass  Time MD responded:  [phone]

## 2015-04-30 NOTE — Plan of Care (Signed)
Problem: Physical Regulation: Goal: Ability to maintain a body temperature in the normal range will improve Outcome: Progressing Afebrile.  Problem: Respiratory: Goal: Ability to maintain a clear airway will improve Outcome: Progressing . Goal: Pain level will decrease Outcome: Completed/Met Date Met:  04/30/15 Denies pain at this time.   Problem: Pain Management: Goal: Expressions of feelings of enhanced comfort will increase Outcome: Progressing .  Problem: Safety: Goal: Ability to remain free from injury will improve Outcome: Progressing Continue.    Problem: Health Behavior/Discharge Planning: Goal: Ability to manage health-related needs will improve Outcome: Not Applicable Date Met:  04/30/15 .  Problem: Pain Managment: Goal: General experience of comfort will improve Outcome: Completed/Met Date Met:  04/30/15 Denies pain.   Continue to monitor.    Problem: Physical Regulation: Goal: Will remain free from infection WBC 31.5.  Afebrile.  Lactic acid last 2.2.  Critical lab- positive blood culture.   Gram positive cocci in clusters.   MD notified.  IV antibiotics as ordered.   Continue with plan of care.       Problem: Skin Integrity: Goal: Risk for impaired skin integrity will decrease Outcome: Completed/Met Date Met:  04/30/15 Pt with adequate nutrition. Able to reposition self in bed.   No breakdown noted.   Continue to monitor skin integrity.    Problem: Nutrition: Goal: Adequate nutrition will be maintained Continue.       

## 2015-04-30 NOTE — Progress Notes (Signed)
CRITICAL VALUE ALERT  Critical value received:  Lactic acid 2.2  Date of notification:  04/29/15  Time of notification:  2140  Critical value read back:Yes.    Nurse who received alert:  J.Saoirse Legere  MD notified (1st page):  T.Callahan  Time of first page:  2142  MD notified (2nd page):  Time of second page:  Responding MD:  J.Peighton Mehra  Time MD responded:  2150

## 2015-04-30 NOTE — Procedures (Signed)
Ultrasound-guided diagnostic and therapeutic left thoracentesis performed yielding 1.5liters of cloudy amber colored fluid. No immediate complications. Follow-up chest x-ray pending.       The procedure was stopped after 1.5L due to his Creatinine, but also because this was his first thoracentesis which is standard.  He was also noted on [place] to have some loculations, but final image on US revealed a significant decrease in effusion.  [name] E 12:40 PM 04/30/2015

## 2015-04-30 NOTE — Progress Notes (Signed)
[facility] PROGRESS NOTE    Progress Note   ALIN MAISTO [id number] DOB: 1926/04/07 DOA: 04/29/2015 PCP: Irven Shelling, MD   Brief Narrative:   [name] is an 82 y.o. male past medical history of aortic stenosis without repair the comes to the ED complaining of shortness of breath cough and generalized weakness  Assessment/Plan:   Severe sepsis due to community-acquired pneumonia: He was aggressively fluid resuscitated with improvement in his Vitals and lactic acid, he was also started on empiric antibiotics azithromycin and Rocephin. His kidney function has improved with IV fluids. He has remained afebrile but continued to have a persistent leukocytosis, thrombocytosis is resolved. His blood pressure has remained stable, I's and O's havebeen poorly recorded, will continue IV fluid hydration. Influenza PCR is negative DC Tamiflu.  Large right pleural effusion: Interventional radiologist was consulted for thoracocentesis was sent for LDH and protein, 1.5 L of fluid was drawn, he still has persistent pleural effusion, will get a CT scan of the chest to rule out loculation.  Acute on chronic kidney disease stage III: Likely due to sepsis, his baseline creatinine is 1 on admission it was 1.9 has improved to 1.5 with IV fluids, will continue gentle hydration.  Generalized weakness: Likely due to sepsis or reevaluate with physical therapy.  Normocytic anemia: Hemoglobin stable.  Essential hypertension: Hold amlodipine.  Tobacco abuse: Counseling was done.  History of aortic stenosis: Continue aspirin.  Aortic valve disorder: Previous 2-D echo done on 03/28/2008 show severe aortic stenosis with valve area of 0.45 cm squares by VTI.     DVT Prophylaxis - Lovenox ordered.  Family Communication: none Disposition Plan: Home 2-3 day Code Status:     Code Status Orders        Start     Ordered   04/29/15 1759  Full code   Continuous     04/29/15 1758    Code Status History    Date Active Date Inactive Code Status Order ID Comments User Context   This patient has a current code status but no historical code status.    Advance Directive Documentation        Most Recent Value   Type of Advance Directive  Out of facility DNR (pink MOST or yellow form)   Pre-existing out of facility DNR order (yellow form or pink MOST form)     "MOST" Form in Place?          IV Access:    Peripheral IV   Procedures and diagnostic studies:   Dg Chest 2 View  04/29/2015  CLINICAL DATA:  82 year old with recent onset of cough and delays. EXAM: CHEST  2 VIEW COMPARISON:  11/05/2013 and earlier. FINDINGS: Prior sternotomy for aortic valve replacement and CABG. Cardiac silhouette likely normal in size, partially obscured by a large left pleural effusion. Associated dense consolidation in the left lower lobe and left upper lobe. Right lung clear. Pulmonary vascularity normal. No right pleural effusion. Degenerative changes and DISH involving the thoracic spine. IMPRESSION: Large left pleural effusion and associated dense passive atelectasis and/or pneumonia in the left lower lobe and left upper lobe. Electronically Signed   By: Evangeline Dakin M.D.   On: 04/29/2015 14:53     Medical Consultants:    None.  Anti-Infectives:   Anti-infectives    Start     Dose/Rate Route Frequency Ordered Stop   04/30/15 1600  cefTRIAXone (ROCEPHIN) 1 g in dextrose 5 % 50 mL IVPB  1 g 100 mL/hr over 30 Minutes Intravenous Every 24 hours 04/29/15 1758 05/07/15 1559   04/30/15 1600  azithromycin (ZITHROMAX) 500 mg in dextrose 5 % 250 mL IVPB     500 mg 250 mL/hr over 60 Minutes Intravenous Every 24 hours 04/29/15 1758 05/07/15 1559   04/29/15 1515  cefTRIAXone (ROCEPHIN) 1 g in dextrose 5 % 50 mL IVPB     1 g 100 mL/hr over 30 Minutes Intravenous  Once 04/29/15 1514 04/29/15 1647   04/29/15 1515  azithromycin (ZITHROMAX) 500 mg in dextrose 5 % 250  mL IVPB     500 mg 250 mL/hr over 60 Minutes Intravenous  Once 04/29/15 1514 04/29/15 1718      Subjective:    [name] Reason he relates his breathing and left shoulder pain are improved.  Objective:    Filed Vitals:   04/29/15 2022 04/29/15 2024 04/30/15 0518 04/30/15 0957  BP: 133/54  104/51 109/55  Pulse: 86  90   Temp: 98.1 F (36.7 C)  99 F (37.2 C)   TempSrc: Oral  Oral   Resp: 20  20   Height:  5\' 6"  (1.676 m)    Weight:  73.2 kg (161 lb 6 oz)    SpO2: 98%  92%     Intake/Output Summary (Last 24 hours) at 04/30/15 1219 Last data filed at 04/30/15 0659  Gross per 24 hour  Intake 1087.5 ml  Output    250 ml  Net  837.5 ml   Filed Weights   04/29/15 2024  Weight: 73.2 kg (161 lb 6 oz)    Exam: Gen:  NAD Cardiovascular:  RRR. Chest and lungs:   Good air movement with decreased sounds in the left lung. Abdomen:  Abdomen soft, NT/ND, + BS Extremities:  Noedema   Data Reviewed:    Labs: Basic Metabolic Panel:  Recent Labs Lab 04/29/15 1429 04/30/15 0441  NA 134* 135  K 4.2 4.8  CL 101 112*  CO2 22 19*  GLUCOSE 205* 146*  BUN 34* 30*  CREATININE 1.91* 1.52*  CALCIUM 8.6* 8.4*   GFR Estimated Creatinine Clearance: 31.5 mL/min (by C-G formula based on Cr of 1.52). Liver Function Tests:  Recent Labs Lab 04/29/15 1429  AST 25  ALT 24  ALKPHOS 92  BILITOT 1.2  PROT 7.4  ALBUMIN 2.9*   No results for input(s): LIPASE, AMYLASE in the last 168 hours. No results for input(s): AMMONIA in the last 168 hours. Coagulation profile  Recent Labs Lab 04/29/15 1818  INR 1.72*    CBC:  Recent Labs Lab 04/29/15 1429 04/30/15 0441  WBC 30.7* 31.5*  NEUTROABS 28.1*  --   HGB 11.1* 9.9*  HCT 32.1* 27.8*  MCV 87.5 86.9  PLT 457* 400   Cardiac Enzymes: No results for input(s): CKTOTAL, CKMB, CKMBINDEX, TROPONINI in the last 168 hours. BNP (last 3 results) No results for input(s): PROBNP in the last 8760 hours. CBG: No results for  input(s): GLUCAP in the last 168 hours. D-Dimer: No results for input(s): DDIMER in the last 72 hours. Hgb A1c: No results for input(s): HGBA1C in the last 72 hours. Lipid Profile: No results for input(s): CHOL, HDL, LDLCALC, TRIG, CHOLHDL, LDLDIRECT in the last 72 hours. Thyroid function studies:  Recent Labs  04/29/15 1429  TSH 1.373   Anemia work up: No results for input(s): VITAMINB12, FOLATE, FERRITIN, TIBC, IRON, RETICCTPCT in the last 72 hours. Sepsis Labs:  Recent Labs Lab 04/29/15 1429 04/29/15 1438  04/29/15 1818 04/29/15 2050 04/30/15 0441  PROCALCITON  --   --  0.51  --   --   WBC 30.7*  --   --   --  31.5*  LATICACIDVEN  --  3.47* 1.6 2.2*  --    Microbiology Recent Results (from the past 240 hour(s))  Blood culture (routine x 2)     Status: None (Preliminary result)   Collection Time: 04/29/15  4:00 PM  Result Value Ref Range Status   Specimen Description BLOOD RIGHT ANTECUBITAL  Final   Special Requests IN PEDIATRIC BOTTLE 5CC  Final   Culture   Final    NO GROWTH < 24 HOURS Performed at [facility]    Report Status PENDING  Incomplete  Blood culture (routine x 2)     Status: None (Preliminary result)   Collection Time: 04/29/15  4:10 PM  Result Value Ref Range Status   Specimen Description BLOOD RIGHT HAND  Final   Special Requests BOTTLES DRAWN AEROBIC AND ANAEROBIC 5CC  Final   Culture   Final    NO GROWTH < 24 HOURS Performed at [facility]    Report Status PENDING  Incomplete     Medications:   . amLODipine  10 mg Oral Daily  . aspirin EC  81 mg Oral Daily  . azithromycin  500 mg Intravenous Q24H  . cefTRIAXone (ROCEPHIN)  IV  1 g Intravenous Q24H  . dextromethorphan-guaiFENesin  2 tablet Oral BID  . multivitamin with minerals  1 tablet Oral Daily  . pravastatin  20 mg Oral q1800   Continuous Infusions: . sodium chloride 75 mL/hr at 04/30/15 0550    Time spent: 25 min   LOS: 1 day   Charlynne Cousins  [facility] Pager [phone]  *Please refer to [email or web address], password [email or web address] to get updated schedule on who will round on this patient, as hospitalists switch teams weekly. If 7PM-7AM, please contact night-coverage at [email or web address], password [email or web address] for any overnight needs.  04/30/2015, 12:19 PM

## 2015-04-30 NOTE — Telephone Encounter (Addendum)
Spouse called to advise, husband was here to see Dr. Jannifer Franklin Wednesday 04/25/15, had labs drawn at visit. Husband was admitted to hospital yesterday for pneumonia, feels Dr. Jannifer Franklin should have picked up on this at visit. "I'm disappointed and upset". Would like to talk to Dr. Jannifer Franklin about this and why she hasn't heard back from lab results. Please call [phone] before 12pm today.

## 2015-05-01 DIAGNOSIS — N179 Acute kidney failure, unspecified: Secondary | ICD-10-CM | POA: Diagnosis not present

## 2015-05-01 DIAGNOSIS — A419 Sepsis, unspecified organism: Secondary | ICD-10-CM | POA: Diagnosis not present

## 2015-05-01 DIAGNOSIS — J189 Pneumonia, unspecified organism: Secondary | ICD-10-CM | POA: Diagnosis not present

## 2015-05-01 DIAGNOSIS — J869 Pyothorax without fistula: Secondary | ICD-10-CM | POA: Diagnosis not present

## 2015-05-01 DIAGNOSIS — I10 Essential (primary) hypertension: Secondary | ICD-10-CM | POA: Diagnosis not present

## 2015-05-01 LAB — CULTURE, BLOOD (ROUTINE X 2)

## 2015-05-01 MED ORDER — DEXTROSE 5 % IV SOLN
2.0000 g | INTRAVENOUS | Status: DC
  Administered 2015-05-01 – 2015-05-03 (×3): 2 g via INTRAVENOUS
  Filled 2015-05-01 (×4): qty 2

## 2015-05-01 NOTE — Progress Notes (Addendum)
[facility] PROGRESS NOTE    Progress Note   RIKI GEHRING WUJ:[id number] DOB: 22-Sep-1926 DOA: 04/29/2015 PCP: Irven Shelling, MD   Brief Narrative:   [name] is an 82 y.o. male past medical history of aortic stenosis without repair the comes to the ED complaining of shortness of breath cough and generalized weakness  Assessment/Plan:   Severe sepsis due to community-acquired pneumonia: Cont empiric antibiotics azithromycin and Rocephin. His kidney function has improved with IV fluids.  He has remained afebrile but continued to have a persistent leukocytosis, thrombocytosis is resolved. KVO IV fluid hydration. 1/2 BC posotive coagulase negative possible contaminate. Influenza PCR is negative DC Tamiflu.  Large right pleural effusion: Interventional radiologist was consulted for thoracocentesis was sent for LDH and protein, 1.5 L of fluid was drawn, he still has persistent pleural effusion, will get a CT scan of the chest Did not show loculation, but does show extensive infiltrates of the left lung.  Acute on chronic kidney disease stage III: Likely due to sepsis, his baseline creatinine is 1 on admission it was 1.9 has improved to 1.5 with IV fluids. B-met in am.  Generalized weakness: Likely due to sepsis or reevaluate with physical therapy.  Normocytic anemia: Hemoglobin stable.  Essential hypertension: Hold amlodipine.  Tobacco abuse: Counseling was done.  History of aortic stenosis: Continue aspirin.  Aortic valve disorder: Previous 2-D echo done on 03/28/2008 show severe aortic stenosis with valve area of 0.45 cm squares by VTI.     DVT Prophylaxis - Lovenox ordered.  Family Communication: none Disposition Plan: Home 3 day Code Status:     Code Status Orders        Start     Ordered   04/29/15 1759  Full code   Continuous     04/29/15 1758    Code Status History    Date Active Date Inactive Code Status Order ID Comments User  Context   This patient has a current code status but no historical code status.    Advance Directive Documentation        Most Recent Value   Type of Advance Directive  Out of facility DNR (pink MOST or yellow form)   Pre-existing out of facility DNR order (yellow form or pink MOST form)     "MOST" Form in Place?          IV Access:    Peripheral IV   Procedures and diagnostic studies:   Dg Chest 1 View  04/30/2015  CLINICAL DATA:  Left thoracentesis EXAM: CHEST 1 VIEW COMPARISON:  04/29/2015 FINDINGS: Moderately large left effusion, decreased in size from yesterday. No pneumothorax. Left lower lobe collapse Right lung clear IMPRESSION: Moderately large left effusion remains following thoracentesis. No pneumothorax. Electronically Signed   By: Franchot Gallo M.D.   On: 04/30/2015 12:48   Dg Chest 2 View  04/29/2015  CLINICAL DATA:  82 year old with recent onset of cough and delays. EXAM: CHEST  2 VIEW COMPARISON:  11/05/2013 and earlier. FINDINGS: Prior sternotomy for aortic valve replacement and CABG. Cardiac silhouette likely normal in size, partially obscured by a large left pleural effusion. Associated dense consolidation in the left lower lobe and left upper lobe. Right lung clear. Pulmonary vascularity normal. No right pleural effusion. Degenerative changes and DISH involving the thoracic spine. IMPRESSION: Large left pleural effusion and associated dense passive atelectasis and/or pneumonia in the left lower lobe and left upper lobe. Electronically Signed   By: Sherran Needs.D.  On: 04/29/2015 14:53   Ct Chest Wo Contrast  04/30/2015  CLINICAL DATA:  Shortness of breath and cough with generalized weakness beginning several weeks ago. Left pleural effusion on current chest radiograph. EXAM: CT CHEST WITHOUT CONTRAST TECHNIQUE: Multidetector CT imaging of the chest was performed following the standard protocol without IV contrast. COMPARISON:  Chest radiograph, 04/30/2015.  FINDINGS: Neck base and axilla:  No mass or adenopathy. Mediastinum and hila: Heart is normal in size. There changes from cardiac surgery and aortic valve replacement. Coronary artery and aortic atherosclerotic calcifications are noted. No mediastinal or hilar masses or enlarged lymph nodes. Lungs and pleura: Large left pleural effusion. There is significant atelectasis of the left lower lobe. There is atelectasis of a portion of the left upper lobe lingula with some minor dependent subsegmental atelectasis in the posterior left upper lobe. A component of left lung base pneumonia is possible. Minimal right pleural effusion with mild dependent right lower lobe subsegmental atelectasis. No convincing pneumonia and no pulmonary edema. No pneumothorax. Limited upper abdomen:  No acute findings. Musculoskeletal: Degenerative changes throughout the visualized spine. Bones demineralized. No osteoblastic or osteolytic lesions. IMPRESSION: 1. Large left pleural effusion with significant left lower lobe and left upper lobe lingula atelectasis. Consider pneumonia as a component of the parenchymal opacity given the patient's history. 2. Minimal right pleural effusion with associated dependent right lower lobe subsegmental atelectasis. 3. No pulmonary edema. Electronically Signed   By: Lajean Manes M.D.   On: 04/30/2015 15:28   US Thoracentesis Asp Pleural Space W/img Guide  04/30/2015  INDICATION: 82 year old male who was admitted through the emergency department with sepsis secondary to community acquired pneumonia. The patient was found have a large left-sided pleural effusion on chest x-ray. Request has been made for a therapeutic and diagnostic thoracentesis. EXAM: ULTRASOUND GUIDED DIAGNOSTIC AND THERAPEUTIC THORACENTESIS MEDICATIONS: 1% lidocaine COMPLICATIONS: None immediate. PROCEDURE: An ultrasound guided thoracentesis was thoroughly discussed with the patient and questions answered. The benefits, risks,  alternatives and complications were also discussed. The patient understands and wishes to proceed with the procedure. Written consent was obtained. Ultrasound was performed to localize and mark an adequate pocket of fluid in the left chest. The area was then prepped and draped in the normal sterile fashion. 1% Lidocaine was used for local anesthesia. Under ultrasound guidance a Safe-T-Centesis catheter was introduced. Thoracentesis was performed. The procedure was stopped after 1-1/2 L of fluid was obtained. This is standard procedure for a first thoracentesis and the patient had an elevated creatinine as well at 1.56. The catheter was removed and a dressing applied. FINDINGS: A total of approximately 1.5 L of cloudy amber fluid was removed. Multiple loculations were noted on ultrasound. After fluid was removed, the final image revealed that a significant portion of the effusion was removed with this procedure. Samples were sent to the laboratory as requested by the clinical team. IMPRESSION: Successful ultrasound guided left thoracentesis yielding 1.5 L of pleural fluid. Read by: Saverio Danker, PA-C Electronically Signed   By: Jacqulynn Cadet M.D.   On: 04/30/2015 12:46     Medical Consultants:    None.  Anti-Infectives:   Anti-infectives    Start     Dose/Rate Route Frequency Ordered Stop   04/30/15 1600  cefTRIAXone (ROCEPHIN) 1 g in dextrose 5 % 50 mL IVPB     1 g 100 mL/hr over 30 Minutes Intravenous Every 24 hours 04/29/15 1758 05/07/15 1559   04/30/15 1600  azithromycin (ZITHROMAX) 500 mg in dextrose  5 % 250 mL IVPB     500 mg 250 mL/hr over 60 Minutes Intravenous Every 24 hours 04/29/15 1758 05/07/15 1559   04/29/15 1515  cefTRIAXone (ROCEPHIN) 1 g in dextrose 5 % 50 mL IVPB     1 g 100 mL/hr over 30 Minutes Intravenous  Once 04/29/15 1514 04/29/15 1647   04/29/15 1515  azithromycin (ZITHROMAX) 500 mg in dextrose 5 % 250 mL IVPB     500 mg 250 mL/hr over 60 Minutes Intravenous  Once  04/29/15 1514 04/29/15 1718      Subjective:    [name] Reason he relates his breathing and left shoulder pain are improved.  Objective:    Filed Vitals:   04/30/15 1216 04/30/15 1311 04/30/15 2251 05/01/15 0352  BP: 127/43 130/47 138/46 122/58  Pulse:  86 91 95  Temp:  98.4 F (36.9 C) 100.3 F (37.9 C) 98.7 F (37.1 C)  TempSrc:  Oral Oral Oral  Resp:  20 18 18   Height:      Weight:      SpO2:  94% 90% 94%    Intake/Output Summary (Last 24 hours) at 05/01/15 0747 Last data filed at 05/01/15 0600  Gross per 24 hour  Intake 2263.75 ml  Output      0 ml  Net 2263.75 ml   Filed Weights   04/29/15 2024  Weight: 73.2 kg (161 lb 6 oz)    Exam: Gen:  NAD Cardiovascular:  RRR. Chest and lungs:   Good air movement with decreased sounds in the left lung. Abdomen:  Abdomen soft, NT/ND, + BS Extremities:  Noedema   Data Reviewed:    Labs: Basic Metabolic Panel:  Recent Labs Lab 04/29/15 1429 04/30/15 0441  NA 134* 135  K 4.2 4.8  CL 101 112*  CO2 22 19*  GLUCOSE 205* 146*  BUN 34* 30*  CREATININE 1.91* 1.52*  CALCIUM 8.6* 8.4*   GFR Estimated Creatinine Clearance: 31.5 mL/min (by C-G formula based on Cr of 1.52). Liver Function Tests:  Recent Labs Lab 04/29/15 1429  AST 25  ALT 24  ALKPHOS 92  BILITOT 1.2  PROT 7.4  ALBUMIN 2.9*   No results for input(s): LIPASE, AMYLASE in the last 168 hours. No results for input(s): AMMONIA in the last 168 hours. Coagulation profile  Recent Labs Lab 04/29/15 1818  INR 1.72*    CBC:  Recent Labs Lab 04/29/15 1429 04/30/15 0441  WBC 30.7* 31.5*  NEUTROABS 28.1*  --   HGB 11.1* 9.9*  HCT 32.1* 27.8*  MCV 87.5 86.9  PLT 457* 400   Cardiac Enzymes: No results for input(s): CKTOTAL, CKMB, CKMBINDEX, TROPONINI in the last 168 hours. BNP (last 3 results) No results for input(s): PROBNP in the last 8760 hours. CBG: No results for input(s): GLUCAP in the last 168 hours. D-Dimer: No results  for input(s): DDIMER in the last 72 hours. Hgb A1c: No results for input(s): HGBA1C in the last 72 hours. Lipid Profile: No results for input(s): CHOL, HDL, LDLCALC, TRIG, CHOLHDL, LDLDIRECT in the last 72 hours. Thyroid function studies:  Recent Labs  04/29/15 1429  TSH 1.373   Anemia work up: No results for input(s): VITAMINB12, FOLATE, FERRITIN, TIBC, IRON, RETICCTPCT in the last 72 hours. Sepsis Labs:  Recent Labs Lab 04/29/15 1429 04/29/15 1438 04/29/15 1818 04/29/15 2050 04/30/15 0441  PROCALCITON  --   --  0.51  --   --   WBC 30.7*  --   --   --  31.5*  LATICACIDVEN  --  3.47* 1.6 2.2*  --    Microbiology Recent Results (from the past 240 hour(s))  Blood culture (routine x 2)     Status: None (Preliminary result)   Collection Time: 04/29/15  4:00 PM  Result Value Ref Range Status   Specimen Description BLOOD RIGHT ANTECUBITAL  Final   Special Requests IN PEDIATRIC BOTTLE 5CC  Final   Culture  Setup Time   Final    GRAM POSITIVE COCCI IN CLUSTERS IN PEDIATRIC BOTTLE CRITICAL RESULT CALLED TO, READ BACK BY AND VERIFIED WITH: M. Fears RN 12:35 04/30/15  ([email or web address])    Culture   Final    NO GROWTH < 24 HOURS Performed at [facility]    Report Status PENDING  Incomplete  Blood culture (routine x 2)     Status: None (Preliminary result)   Collection Time: 04/29/15  4:10 PM  Result Value Ref Range Status   Specimen Description BLOOD RIGHT HAND  Final   Special Requests BOTTLES DRAWN AEROBIC AND ANAEROBIC 5CC  Final   Culture   Final    NO GROWTH < 24 HOURS Performed at [facility]    Report Status PENDING  Incomplete  Gram stain     Status: None   Collection Time: 04/30/15 12:32 PM  Result Value Ref Range Status   Specimen Description FLUID LEFT PLEURAL  Final   Special Requests NONE Performed at [facility]   Final   Gram Stain   Final    ABUNDANT WBC PRESENT,BOTH PMN AND MONONUCLEAR NO ORGANISMS SEEN    Report Status 04/30/2015  FINAL  Final     Medications:   . amLODipine  10 mg Oral Daily  . aspirin EC  81 mg Oral Daily  . azithromycin  500 mg Intravenous Q24H  . cefTRIAXone (ROCEPHIN)  IV  1 g Intravenous Q24H  . dextromethorphan-guaiFENesin  2 tablet Oral BID  . multivitamin with minerals  1 tablet Oral Daily  . pravastatin  20 mg Oral q1800   Continuous Infusions: . sodium chloride 75 mL/hr at 04/30/15 2301    Time spent: 25 min   LOS: 2 days   Charlynne Cousins  [facility] Pager [phone]  *Please refer to [email or web address], password [email or web address] to get updated schedule on who will round on this patient, as hospitalists switch teams weekly. If 7PM-7AM, please contact night-coverage at [email or web address], password [email or web address] for any overnight needs.  05/01/2015, 7:47 AM

## 2015-05-02 ENCOUNTER — Encounter (HOSPITAL_COMMUNITY): Payer: Self-pay | Admitting: Pulmonary Disease

## 2015-05-02 ENCOUNTER — Inpatient Hospital Stay (HOSPITAL_COMMUNITY): Payer: PPO

## 2015-05-02 DIAGNOSIS — N179 Acute kidney failure, unspecified: Secondary | ICD-10-CM | POA: Diagnosis not present

## 2015-05-02 DIAGNOSIS — J189 Pneumonia, unspecified organism: Secondary | ICD-10-CM | POA: Diagnosis not present

## 2015-05-02 DIAGNOSIS — A419 Sepsis, unspecified organism: Secondary | ICD-10-CM | POA: Diagnosis not present

## 2015-05-02 DIAGNOSIS — R091 Pleurisy: Secondary | ICD-10-CM | POA: Diagnosis not present

## 2015-05-02 DIAGNOSIS — J869 Pyothorax without fistula: Secondary | ICD-10-CM | POA: Diagnosis not present

## 2015-05-02 DIAGNOSIS — J90 Pleural effusion, not elsewhere classified: Secondary | ICD-10-CM | POA: Diagnosis not present

## 2015-05-02 DIAGNOSIS — I10 Essential (primary) hypertension: Secondary | ICD-10-CM | POA: Diagnosis not present

## 2015-05-02 LAB — BASIC METABOLIC PANEL
Anion gap: 8 (ref 5–15)
BUN: 26 mg/dL — AB (ref 6–20)
CALCIUM: 8 mg/dL — AB (ref 8.9–10.3)
CHLORIDE: 108 mmol/L (ref 101–111)
CO2: 21 mmol/L — AB (ref 22–32)
CREATININE: 1.2 mg/dL (ref 0.61–1.24)
GFR calc Af Amer: >60 mL/min (ref >60)
GFR calc non Af Amer: 53 mL/min — ABNORMAL LOW (ref >60)
GLUCOSE: 153 mg/dL — AB (ref 65–99)
Potassium: 4 mmol/L (ref 3.5–5.1)
Sodium: 137 mmol/L (ref 135–145)

## 2015-05-02 LAB — CBC
HCT: 27 % — ABNORMAL LOW (ref 39.0–52.0)
Hemoglobin: 9.3 g/dL — ABNORMAL LOW (ref 13.0–17.0)
MCH: 29.9 pg (ref 26.0–34.0)
MCHC: 34.4 g/dL (ref 30.0–36.0)
MCV: 86.8 fL (ref 78.0–100.0)
PLATELETS: 407 10*3/uL — AB (ref 150–400)
RBC: 3.11 MIL/uL — AB (ref 4.22–5.81)
RDW: 13.2 % (ref 11.5–15.5)
WBC: 22.7 10*3/uL — ABNORMAL HIGH (ref 4.0–10.5)

## 2015-05-02 LAB — PROTIME-INR
INR: 1.59 — ABNORMAL HIGH (ref 0.00–1.49)
Prothrombin Time: 19 seconds — ABNORMAL HIGH (ref 11.6–15.2)

## 2015-05-02 LAB — APTT: aPTT: 41 seconds — ABNORMAL HIGH (ref 24–37)

## 2015-05-02 MED ORDER — MIDAZOLAM HCL 2 MG/2ML IJ SOLN
INTRAMUSCULAR | Status: AC | PRN
  Administered 2015-05-02: 0.5 mg via INTRAVENOUS

## 2015-05-02 MED ORDER — FENTANYL CITRATE (PF) 100 MCG/2ML IJ SOLN
INTRAMUSCULAR | Status: AC
  Filled 2015-05-02: qty 2

## 2015-05-02 MED ORDER — MIDAZOLAM HCL 2 MG/2ML IJ SOLN
INTRAMUSCULAR | Status: AC
  Filled 2015-05-02: qty 4

## 2015-05-02 MED ORDER — FENTANYL CITRATE (PF) 100 MCG/2ML IJ SOLN
INTRAMUSCULAR | Status: AC | PRN
  Administered 2015-05-02: 25 ug via INTRAVENOUS

## 2015-05-02 NOTE — Progress Notes (Signed)
[facility] PROGRESS NOTE    Progress Note   [name] [id number] DOB: 1926-09-06 DOA: 04/29/2015 PCP: Irven Shelling, MD   Brief Narrative:   [name] is an 82 y.o. male past medical history of aortic stenosis without repair the comes to the ED complaining of shortness of breath cough and generalized weakness  Assessment/Plan:   Severe sepsis due to community-acquired pneumonia: Cont empiric antibiotics azithromycin and Rocephin. His kidney function has improved with IV fluids.  He has remained afebrile and leukocytosis has improved. KVO IV fluid hydration. 1/2 BC posotive coagulase negative possible contaminate. Influenza PCR is negative DC Tamiflu. Consult PCCM.  Large right pleural effusion: Interventional radiologist was consulted for thoracocentesis was sent for LDH and protein and WBC. 1.5 L of fluid was drawn, he still has persistent pleural effusion, CT scan of the chest  ? loculation, but does show extensive infiltrates of the left lung and collapse.  Acute on chronic kidney disease stage III: Likely due to sepsis, his baseline creatinine is 1 on admission it was 1.9 has improved to 1.5 with IV fluids. His Cr is now back to baseline.  Generalized weakness: Still awaiting PT consult.  Normocytic anemia: Hemoglobin stable.  Essential hypertension: Hold amlodipine.  Tobacco abuse: Counseling was done.  History of aortic stenosis: Continue aspirin.  Aortic valve disorder: Previous 2-D echo done on 03/28/2008 show severe aortic stenosis with valve area of 0.45 cm squares by VTI.     DVT Prophylaxis - Lovenox ordered.  Family Communication: none Disposition Plan: Home 2-3 day Code Status:     Code Status Orders        Start     Ordered   04/29/15 1759  Full code   Continuous     04/29/15 1758    Code Status History    Date Active Date Inactive Code Status Order ID Comments User Context   This patient has a current  code status but no historical code status.    Advance Directive Documentation        Most Recent Value   Type of Advance Directive  Out of facility DNR (pink MOST or yellow form)   Pre-existing out of facility DNR order (yellow form or pink MOST form)     "MOST" Form in Place?          IV Access:    Peripheral IV   Procedures and diagnostic studies:   Dg Chest 1 View  04/30/2015  CLINICAL DATA:  Left thoracentesis EXAM: CHEST 1 VIEW COMPARISON:  04/29/2015 FINDINGS: Moderately large left effusion, decreased in size from yesterday. No pneumothorax. Left lower lobe collapse Right lung clear IMPRESSION: Moderately large left effusion remains following thoracentesis. No pneumothorax. Electronically Signed   By: Franchot Gallo M.D.   On: 04/30/2015 12:48   Ct Chest Wo Contrast  04/30/2015  CLINICAL DATA:  Shortness of breath and cough with generalized weakness beginning several weeks ago. Left pleural effusion on current chest radiograph. EXAM: CT CHEST WITHOUT CONTRAST TECHNIQUE: Multidetector CT imaging of the chest was performed following the standard protocol without IV contrast. COMPARISON:  Chest radiograph, 04/30/2015. FINDINGS: Neck base and axilla:  No mass or adenopathy. Mediastinum and hila: Heart is normal in size. There changes from cardiac surgery and aortic valve replacement. Coronary artery and aortic atherosclerotic calcifications are noted. No mediastinal or hilar masses or enlarged lymph nodes. Lungs and pleura: Large left pleural effusion. There is significant atelectasis of the left lower lobe. There is  atelectasis of a portion of the left upper lobe lingula with some minor dependent subsegmental atelectasis in the posterior left upper lobe. A component of left lung base pneumonia is possible. Minimal right pleural effusion with mild dependent right lower lobe subsegmental atelectasis. No convincing pneumonia and no pulmonary edema. No pneumothorax. Limited upper abdomen:  No  acute findings. Musculoskeletal: Degenerative changes throughout the visualized spine. Bones demineralized. No osteoblastic or osteolytic lesions. IMPRESSION: 1. Large left pleural effusion with significant left lower lobe and left upper lobe lingula atelectasis. Consider pneumonia as a component of the parenchymal opacity given the patient's history. 2. Minimal right pleural effusion with associated dependent right lower lobe subsegmental atelectasis. 3. No pulmonary edema. Electronically Signed   By: Lajean Manes M.D.   On: 04/30/2015 15:28   US Thoracentesis Asp Pleural Space W/img Guide  04/30/2015  INDICATION: 82 year old male who was admitted through the emergency department with sepsis secondary to community acquired pneumonia. The patient was found have a large left-sided pleural effusion on chest x-ray. Request has been made for a therapeutic and diagnostic thoracentesis. EXAM: ULTRASOUND GUIDED DIAGNOSTIC AND THERAPEUTIC THORACENTESIS MEDICATIONS: 1% lidocaine COMPLICATIONS: None immediate. PROCEDURE: An ultrasound guided thoracentesis was thoroughly discussed with the patient and questions answered. The benefits, risks, alternatives and complications were also discussed. The patient understands and wishes to proceed with the procedure. Written consent was obtained. Ultrasound was performed to localize and mark an adequate pocket of fluid in the left chest. The area was then prepped and draped in the normal sterile fashion. 1% Lidocaine was used for local anesthesia. Under ultrasound guidance a Safe-T-Centesis catheter was introduced. Thoracentesis was performed. The procedure was stopped after 1-1/2 L of fluid was obtained. This is standard procedure for a first thoracentesis and the patient had an elevated creatinine as well at 1.56. The catheter was removed and a dressing applied. FINDINGS: A total of approximately 1.5 L of cloudy amber fluid was removed. Multiple loculations were noted on  ultrasound. After fluid was removed, the final image revealed that a significant portion of the effusion was removed with this procedure. Samples were sent to the laboratory as requested by the clinical team. IMPRESSION: Successful ultrasound guided left thoracentesis yielding 1.5 L of pleural fluid. Read by: Saverio Danker, PA-C Electronically Signed   By: Jacqulynn Cadet M.D.   On: 04/30/2015 12:46     Medical Consultants:    None.  Anti-Infectives:   Anti-infectives    Start     Dose/Rate Route Frequency Ordered Stop   05/01/15 1600  cefTRIAXone (ROCEPHIN) 2 g in dextrose 5 % 50 mL IVPB     2 g 100 mL/hr over 30 Minutes Intravenous Every 24 hours 05/01/15 1347 05/07/15 1559   04/30/15 1600  cefTRIAXone (ROCEPHIN) 1 g in dextrose 5 % 50 mL IVPB  Status:  Discontinued     1 g 100 mL/hr over 30 Minutes Intravenous Every 24 hours 04/29/15 1758 05/01/15 1347   04/30/15 1600  azithromycin (ZITHROMAX) 500 mg in dextrose 5 % 250 mL IVPB     500 mg 250 mL/hr over 60 Minutes Intravenous Every 24 hours 04/29/15 1758 05/07/15 1559   04/29/15 1515  cefTRIAXone (ROCEPHIN) 1 g in dextrose 5 % 50 mL IVPB     1 g 100 mL/hr over 30 Minutes Intravenous  Once 04/29/15 1514 04/29/15 1647   04/29/15 1515  azithromycin (ZITHROMAX) 500 mg in dextrose 5 % 250 mL IVPB     500 mg 250 mL/hr over  60 Minutes Intravenous  Once 04/29/15 1514 04/29/15 1718      Subjective:    [name] he relates his breathing is unchanged today.  Objective:    Filed Vitals:   05/01/15 0352 05/01/15 1349 05/01/15 1940 05/02/15 0501  BP: 122/58 131/56 141/53 126/48  Pulse: 95 90 90 88  Temp: 98.7 F (37.1 C) 99.5 F (37.5 C) 99.5 F (37.5 C) 98.7 F (37.1 C)  TempSrc: Oral Oral Oral Oral  Resp: 18 20 20 20   Height:      Weight:      SpO2: 94% 96% 94% 94%    Intake/Output Summary (Last 24 hours) at 05/02/15 0808 Last data filed at 05/02/15 0700  Gross per 24 hour  Intake 1193.5 ml  Output    375 ml   Net  818.5 ml   Filed Weights   04/29/15 2024  Weight: 73.2 kg (161 lb 6 oz)    Exam: Gen:  NAD Cardiovascular:  RRR. Chest and lungs:   Good air movement with decreased sounds in the left lung. Abdomen:  Abdomen soft, NT/ND, + BS Extremities:  Noedema   Data Reviewed:    Labs: Basic Metabolic Panel:  Recent Labs Lab 04/29/15 1429 04/30/15 0441 05/02/15 0442  NA 134* 135 137  K 4.2 4.8 4.0  CL 101 112* 108  CO2 22 19* 21*  GLUCOSE 205* 146* 153*  BUN 34* 30* 26*  CREATININE 1.91* 1.52* 1.20  CALCIUM 8.6* 8.4* 8.0*   GFR Estimated Creatinine Clearance: 39.9 mL/min (by C-G formula based on Cr of 1.2). Liver Function Tests:  Recent Labs Lab 04/29/15 1429  AST 25  ALT 24  ALKPHOS 92  BILITOT 1.2  PROT 7.4  ALBUMIN 2.9*   No results for input(s): LIPASE, AMYLASE in the last 168 hours. No results for input(s): AMMONIA in the last 168 hours. Coagulation profile  Recent Labs Lab 04/29/15 1818  INR 1.72*    CBC:  Recent Labs Lab 04/29/15 1429 04/30/15 0441 05/02/15 0442  WBC 30.7* 31.5* 22.7*  NEUTROABS 28.1*  --   --   HGB 11.1* 9.9* 9.3*  HCT 32.1* 27.8* 27.0*  MCV 87.5 86.9 86.8  PLT 457* 400 407*   Cardiac Enzymes: No results for input(s): CKTOTAL, CKMB, CKMBINDEX, TROPONINI in the last 168 hours. BNP (last 3 results) No results for input(s): PROBNP in the last 8760 hours. CBG: No results for input(s): GLUCAP in the last 168 hours. D-Dimer: No results for input(s): DDIMER in the last 72 hours. Hgb A1c: No results for input(s): HGBA1C in the last 72 hours. Lipid Profile: No results for input(s): CHOL, HDL, LDLCALC, TRIG, CHOLHDL, LDLDIRECT in the last 72 hours. Thyroid function studies:  Recent Labs  04/29/15 1429  TSH 1.373   Anemia work up: No results for input(s): VITAMINB12, FOLATE, FERRITIN, TIBC, IRON, RETICCTPCT in the last 72 hours. Sepsis Labs:  Recent Labs Lab 04/29/15 1429 04/29/15 1438 04/29/15 1818  04/29/15 2050 04/30/15 0441 05/02/15 0442  PROCALCITON  --   --  0.51  --   --   --   WBC 30.7*  --   --   --  31.5* 22.7*  LATICACIDVEN  --  3.47* 1.6 2.2*  --   --    Microbiology Recent Results (from the past 240 hour(s))  Blood culture (routine x 2)     Status: Abnormal   Collection Time: 04/29/15  4:00 PM  Result Value Ref Range Status   Specimen Description  BLOOD RIGHT ANTECUBITAL  Final   Special Requests IN PEDIATRIC BOTTLE 5CC  Final   Culture  Setup Time   Final    GRAM POSITIVE COCCI IN CLUSTERS IN PEDIATRIC BOTTLE CRITICAL RESULT CALLED TO, READ BACK BY AND VERIFIED WITH: M. Fears RN 12:35 04/30/15  ([email or web address])    Culture (A)  Final    STAPHYLOCOCCUS SPECIES (COAGULASE NEGATIVE) THE SIGNIFICANCE OF ISOLATING THIS ORGANISM FROM A SINGLE SET OF BLOOD CULTURES WHEN MULTIPLE SETS ARE DRAWN IS UNCERTAIN. PLEASE NOTIFY THE MICROBIOLOGY DEPARTMENT WITHIN ONE WEEK IF SPECIATION AND SENSITIVITIES ARE REQUIRED. Performed at [facility]    Report Status 05/01/2015 FINAL  Final  Blood culture (routine x 2)     Status: None (Preliminary result)   Collection Time: 04/29/15  4:10 PM  Result Value Ref Range Status   Specimen Description BLOOD RIGHT HAND  Final   Special Requests BOTTLES DRAWN AEROBIC AND ANAEROBIC 5CC  Final   Culture   Final    NO GROWTH 2 DAYS Performed at [facility]    Report Status PENDING  Incomplete  Culture, body fluid-bottle     Status: None (Preliminary result)   Collection Time: 04/30/15 12:32 PM  Result Value Ref Range Status   Specimen Description FLUID LEFT PLEURAL  Final   Special Requests BOTTLES DRAWN AEROBIC AND ANAEROBIC 10CC  Final   Culture   Final    NO GROWTH < 24 HOURS Performed at [facility]    Report Status PENDING  Incomplete  Gram stain     Status: None   Collection Time: 04/30/15 12:32 PM  Result Value Ref Range Status   Specimen Description FLUID LEFT PLEURAL  Final   Special Requests NONE Performed at  [facility]   Final   Gram Stain   Final    ABUNDANT WBC PRESENT,BOTH PMN AND MONONUCLEAR NO ORGANISMS SEEN    Report Status 04/30/2015 FINAL  Final     Medications:   . amLODipine  10 mg Oral Daily  . aspirin EC  81 mg Oral Daily  . azithromycin  500 mg Intravenous Q24H  . cefTRIAXone (ROCEPHIN)  IV  2 g Intravenous Q24H  . dextromethorphan-guaiFENesin  2 tablet Oral BID  . multivitamin with minerals  1 tablet Oral Daily  . pravastatin  20 mg Oral q1800   Continuous Infusions: . sodium chloride 10 mL/hr at 05/01/15 0754    Time spent: 25 min   LOS: 3 days   Charlynne Cousins  [facility] Pager [phone]  *Please refer to [email or web address], password [email or web address] to get updated schedule on who will round on this patient, as hospitalists switch teams weekly. If 7PM-7AM, please contact night-coverage at [email or web address], password [email or web address] for any overnight needs.  05/02/2015, 8:08 AM

## 2015-05-02 NOTE — Sedation Documentation (Signed)
Patient denies pain and is resting comfortably.  

## 2015-05-02 NOTE — Progress Notes (Signed)
Received pt form IR, VS obtained, drain to L chest intact to suction, call light placed in reach

## 2015-05-02 NOTE — Consult Note (Signed)
Chief Complaint: PNA with left side pleural effusion  Referring Physician:Dr. Kara Mead  Supervising Physician: Aletta Edouard  HPI: [facility] is an 82 y.o. male who presented to the hospital with sepsis secondary to community acquired PNA.  He has an elevated WBC.  Initially IR did a left-side thoracentesis on the patient.  This revealed loculations.  1.5L of amber colored fluid were removed.  This did have elevated WBCs in it as well, but no growth in the culture to date.  Pulmonary service evaluated the patient today and felt as though a drain in his left, remaining fluid collection, may be beneficial in getting him better as his WBC remains elevated at 22K and he still feels very poorly.  Past Medical History:  Past Medical History  Diagnosis Date  . Severe aortic stenosis 2010    S/P AVR-TISSUE VALVE  . Coronary artery disease with hx of myocardial infarct w/o hx of CABG 2010  . Hypertension   . Hyperlipidemia   . BPH (benign prostatic hyperplasia)   . Impaired fasting glucose   . Diverticulosis   . 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 Memory loss   . Retinal tear   . Memory difficulty 10/19/2012  . HOH (hard of hearing)     hearing aids    Past Surgical History:  Past Surgical History  Procedure Laterality Date  . Tonsillectomy    . Aortic valve replacement    . 2 vessel cabg    . [place]    . Cataract extraction, bilateral      RT 2010 LT 2014    Family History:  Family History  Problem Relation Age of Onset  . Stroke Father   . Cancer Brother   . Heart attack Neg Hx   . Hypertension Brother     Social History:  reports that he has been smoking Pipe.  He has never used smokeless tobacco. He reports that he does not drink alcohol or use illicit drugs.  Allergies: No Known Allergies  Medications:   Medication List    ASK your doctor about these medications        amLODipine 10 MG tablet  Commonly known as:  NORVASC  Take 1 tablet (10 mg  total) by mouth daily.     amoxicillin 500 MG capsule  Commonly known as:  AMOXIL  Take 500 mg by mouth 3 (three) times daily as needed (dental procedure). PRIOR TO DENTAL PROCEDURE.     aspirin EC 81 MG tablet  Take 81 mg by mouth daily.     lisinopril 20 MG tablet  Commonly known as:  PRINIVIL,ZESTRIL  Take 2 tablets (40 mg total) by mouth daily.     multivitamin with minerals tablet  Take 1 tablet by mouth daily.     pravastatin 20 MG tablet  Commonly known as:  PRAVACHOL  TAKE 1 TABLET BY MOUTH EVERY DAY        Please HPI for pertinent positives, otherwise complete 10 system ROS negative.  Mallampati Score: MD Evaluation Airway: WNL Heart: WNL Abdomen: WNL Chest/ Lungs: WNL ASA  Classification: 3 Mallampati/Airway Score: Two  Physical Exam: BP 126/48 mmHg  Pulse 88  Temp(Src) 98.7 F (37.1 C) (Oral)  Resp 20  Ht _0  (1.676 m)  Wt 161 lb 6 oz (73.2 kg)  BMI 26.06 kg/m2  SpO2 94% Body mass index is 26.06 kg/(m^2). General: pleasant, WD, WN, elderly white male who is laying in bed in NAD HEENT:  head is normocephalic, atraumatic.  Sclera are noninjected.  PERRL.  Ears and nose without any masses or lesions.  Mouth is pink and moist Heart: regular, rate, and rhythm.  Normal s1,s2. No obvious gallops, or rubs noted. +murmur  Palpable radial and pedal pulses bilaterally Lungs: CTAB, but decrease sounds at left base, no wheezes, rhonchi, or rales noted.  Respiratory effort slightly labored especially when talking, otherwise moans with expiration Abd: soft, NT, ND, +BS, no masses, hernias, or organomegaly Psych: A&Ox3 with an appropriate affect.   Labs: Results for orders placed or performed during the hospital encounter of 04/29/15 (from the past 48 hour(s))  Basic metabolic panel     Status: Abnormal   Collection Time: 05/02/15  4:42 AM  Result Value Ref Range   Sodium 137 135 - 145 mmol/L   Potassium 4.0 3.5 - 5.1 mmol/L   Chloride 108 101 - 111 mmol/L    CO2 21 (L) 22 - 32 mmol/L   Glucose, Bld 153 (H) 65 - 99 mg/dL   BUN 26 (H) 6 - 20 mg/dL   Creatinine, Ser 1.20 0.61 - 1.24 mg/dL   Calcium 8.0 (L) 8.9 - 10.3 mg/dL   GFR calc non Af Amer 53 (L) >60 mL/min   GFR calc Af Amer >60 >60 mL/min    Comment: (NOTE) The eGFR has been calculated using the CKD EPI equation. This calculation has not been validated in all clinical situations. eGFR's persistently <60 mL/min signify possible Chronic Kidney Disease.    Anion gap 8 5 - 15  CBC     Status: Abnormal   Collection Time: 05/02/15  4:42 AM  Result Value Ref Range   WBC 22.7 (H) 4.0 - 10.5 K/uL    Comment: WHITE COUNT CONFIRMED ON SMEAR   RBC 3.11 (L) 4.22 - 5.81 MIL/uL   Hemoglobin 9.3 (L) 13.0 - 17.0 g/dL   HCT 27.0 (L) 39.0 - 52.0 %   MCV 86.8 78.0 - 100.0 fL   MCH 29.9 26.0 - 34.0 pg   MCHC 34.4 30.0 - 36.0 g/dL   RDW 13.2 11.5 - 15.5 %   Platelets 407 (H) 150 - 400 K/uL  APTT     Status: Abnormal   Collection Time: 05/02/15  1:13 PM  Result Value Ref Range   aPTT 41 (H) 24 - 37 seconds    Comment:        IF BASELINE aPTT IS ELEVATED, SUGGEST PATIENT RISK ASSESSMENT BE USED TO DETERMINE APPROPRIATE ANTICOAGULANT THERAPY.   Protime-INR     Status: Abnormal   Collection Time: 05/02/15  1:13 PM  Result Value Ref Range   Prothrombin Time 19.0 (H) 11.6 - 15.2 seconds   INR 1.59 (H) 0.00 - 1.49    Imaging: Ct Chest Wo Contrast  04/30/2015  CLINICAL DATA:  Shortness of breath and cough with generalized weakness beginning several weeks ago. Left pleural effusion on current chest radiograph. EXAM: CT CHEST WITHOUT CONTRAST TECHNIQUE: Multidetector CT imaging of the chest was performed following the standard protocol without IV contrast. COMPARISON:  Chest radiograph, 04/30/2015. FINDINGS: Neck base and axilla:  No mass or adenopathy. Mediastinum and hila: Heart is normal in size. There changes from cardiac surgery and aortic valve replacement. Coronary artery and aortic  atherosclerotic calcifications are noted. No mediastinal or hilar masses or enlarged lymph nodes. Lungs and pleura: Large left pleural effusion. There is significant atelectasis of the left lower lobe. There is atelectasis of a portion of the left upper  lobe lingula with some minor dependent subsegmental atelectasis in the posterior left upper lobe. A component of left lung base pneumonia is possible. Minimal right pleural effusion with mild dependent right lower lobe subsegmental atelectasis. No convincing pneumonia and no pulmonary edema. No pneumothorax. Limited upper abdomen:  No acute findings. Musculoskeletal: Degenerative changes throughout the visualized spine. Bones demineralized. No osteoblastic or osteolytic lesions. IMPRESSION: 1. Large left pleural effusion with significant left lower lobe and left upper lobe lingula atelectasis. Consider pneumonia as a component of the parenchymal opacity given the patient's history. 2. Minimal right pleural effusion with associated dependent right lower lobe subsegmental atelectasis. 3. No pulmonary edema. Electronically Signed   By: Lajean Manes M.D.   On: 04/30/2015 15:28    Assessment/Plan 1. Left-side pleural effusion secondary to CAP -the patient has not eaten since 8 this morning.  I have made him NPO otherwise -INR is 1.59, but other labs are ok -will likely plan to place drain this afternoon if able -Risks and Benefits discussed with the patient including bleeding, infection, damage to adjacent structures, bowel perforation/fistula connection, and sepsis. All of the patient's questions were answered, patient is agreeable to proceed. Consent signed and in chart. -I also spoke to the patient's wife on the phone and explained this to her as well.  Thank you for this interesting consult.  I greatly enjoyed meeting [name] and look forward to participating in their care.  A copy of this report was sent to the requesting provider on this  date.  Electronically Signed: Henreitta Cea 05/02/2015, 2:18 PM   I spent a total of 40 Minutes  in face to face in clinical consultation, greater than 50% of which was counseling/coordinating care for left-sided pleural effusion

## 2015-05-02 NOTE — Evaluation (Signed)
Physical Therapy Evaluation Patient Details Name: [name] MRN: [id number] DOB: Oct 04, 1926 Today's Date: 05/02/2015   History of Present Illness  Pt is an 82 year old male with hx of memory loss, aortic valve replacement, HTN and admitted with large left pleural effusion and CAP with Empyema  Clinical Impression  Pt admitted with above diagnosis. Pt currently with functional limitations due to the deficits listed below (see PT Problem List).  Pt will benefit from skilled PT to increase their independence and safety with mobility to allow discharge to the venue listed below.   Pt requiring assist for mobility and fatigues quickly.  If pt has assist at home, recommend HHPT however if not, would benefit from SNF (spouse and children would be unable to assist him).     Follow Up Recommendations Home health PT;Supervision/Assistance - 24 hour    Equipment Recommendations  Rolling walker with 5" wheels    Recommendations for Other Services       Precautions / Restrictions Precautions Precautions: Fall      Mobility  Bed Mobility Overal bed mobility: Needs Assistance Bed Mobility: Supine to Sit;Sit to Supine     Supine to sit: Mod assist Sit to supine: Mod assist   General bed mobility comments: assist for upper body upright and LEs onto bed  Transfers Overall transfer level: Needs assistance Equipment used: Rolling walker (2 wheeled) Transfers: Sit to/from Stand Sit to Stand: Min assist         General transfer comment: assist to steady upon rise, verbal cues for hand placement  Ambulation/Gait Ambulation/Gait assistance: Min guard Ambulation Distance (Feet): 16 Feet Assistive device: Rolling walker (2 wheeled) Gait Pattern/deviations: Step-through pattern;Decreased stride length     General Gait Details: decreased speed, verbal cues for use of RW, pt reported need to use restroom at room door and incontinent of stool upon trying to get to bathroom so BSC  brought to pt for safety  Stairs            Wheelchair Mobility    Modified Rankin (Stroke Patients Only)       Balance Overall balance assessment: Needs assistance (denies falls)         Standing balance support: Bilateral upper extremity supported;During functional activity Standing balance-Leahy Scale: Poor Standing balance comment: requires UE support at this time                             Pertinent Vitals/Pain Pain Assessment: No/denies pain    Home Living Family/patient expects to be discharged to:: Private residence Living Arrangements: Spouse/significant other;Children   Type of Home: [place]: One level Home Equipment: None Additional Comments: pt reports his spouse and 3 children have muscular dystrophy and all use assistive devices    Prior Function Level of Independence: Independent               Hand Dominance        Extremity/Trunk Assessment                         Communication   Communication: HOH  Cognition Arousal/Alertness: Awake/alert Behavior During Therapy: WFL for tasks assessed/performed Overall Cognitive Status: Within Functional Limits for tasks assessed                      General Comments      Exercises  Assessment/Plan    PT Assessment Patient needs continued PT services  PT Diagnosis Difficulty walking;Generalized weakness   PT Problem List Decreased strength;Decreased activity tolerance;Decreased mobility;Decreased coordination;Decreased knowledge of use of DME  PT Treatment Interventions Gait training;DME instruction;Functional mobility training;Patient/family education;Therapeutic activities;Therapeutic exercise   PT Goals (Current goals can be found in the Care Plan section) Acute Rehab PT Goals PT Goal Formulation: With patient Time For Goal Achievement: 05/09/15 Potential to Achieve Goals: Good    Frequency Min 3X/week   Barriers to  discharge        Co-evaluation               End of Session Equipment Utilized During Treatment: Gait belt Activity Tolerance: Patient limited by fatigue Patient left: in bed;with call bell/phone within reach;with bed alarm set Nurse Communication: Mobility status         Time: [id number] PT Time Calculation (min) (ACUTE ONLY): 20 min   Charges:   PT Evaluation $PT Eval Moderate Complexity: 1 Procedure     PT G Codes:        [name],KATHrine E 05/02/2015, 12:13 PM Carmelia Bake, PT, DPT 05/02/2015 Pager: [phone]

## 2015-05-02 NOTE — Care Management Important Message (Signed)
Important Message  Patient Details  Name: [name] MRN: [id number] Date of Birth: July 13, 1926   Medicare Important Message Given:  Yes    Camillo Flaming 05/02/2015, 10:46 AMImportant Message  Patient Details  Name: [name] MRN: [id number] Date of Birth: 02/23/1926   Medicare Important Message Given:  Yes    Camillo Flaming 05/02/2015, 10:46 AM

## 2015-05-02 NOTE — Procedures (Signed)
Interventional Radiology Procedure Note  Procedure:  CT guided placement of left pleural drainage catheter  Complications:  None  Estimated Blood Loss:  < 10 mL  CT shows enlarged left pleural effusion/empyema since prior CT 2 days ago. 14 Fr pigtail drainage catheter placed.  Attached to Bier.  Will attach to wall suction at -20 cm H2O.  Venetia Night. Kathlene Cote, M.D Pager:  [phone]
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
## 2015-05-02 NOTE — Progress Notes (Signed)
CSW received notification from PT that pt may benefit from short term rehab at [facility] Per PT, pt has spouse and children at home, but they are unable to provide pt assistance.  CSW attempted to meet with pt, but pt out of room for procedure.   CSW to follow up at a later time to discuss disposition planning.  Alison Murray, MSW, [place] Work [phone]

## 2015-05-03 ENCOUNTER — Inpatient Hospital Stay (HOSPITAL_COMMUNITY): Payer: PPO

## 2015-05-03 DIAGNOSIS — R05 Cough: Secondary | ICD-10-CM | POA: Diagnosis not present

## 2015-05-03 DIAGNOSIS — J869 Pyothorax without fistula: Secondary | ICD-10-CM | POA: Diagnosis not present

## 2015-05-03 DIAGNOSIS — J189 Pneumonia, unspecified organism: Secondary | ICD-10-CM | POA: Diagnosis not present

## 2015-05-03 DIAGNOSIS — N179 Acute kidney failure, unspecified: Secondary | ICD-10-CM | POA: Diagnosis not present

## 2015-05-03 DIAGNOSIS — I10 Essential (primary) hypertension: Secondary | ICD-10-CM | POA: Diagnosis not present

## 2015-05-03 DIAGNOSIS — A419 Sepsis, unspecified organism: Secondary | ICD-10-CM | POA: Diagnosis not present

## 2015-05-03 LAB — BODY FLUID CELL COUNT WITH DIFFERENTIAL
Eos, Fluid: 0 %
LYMPHS FL: 0 %
Monocyte-Macrophage-Serous Fluid: 1 % — ABNORMAL LOW (ref 50–90)
Neutrophil Count, Fluid: 99 % — ABNORMAL HIGH (ref 0–25)
Total Nucleated Cell Count, Fluid: 804 cu mm (ref 0–1000)

## 2015-05-03 LAB — GLUCOSE, SEROUS FLUID

## 2015-05-03 MED ORDER — ENSURE ENLIVE PO LIQD
237.0000 mL | Freq: Two times a day (BID) | ORAL | Status: DC
  Administered 2015-05-05: 237 mL via ORAL

## 2015-05-03 MED ORDER — SODIUM CHLORIDE 0.9 % IJ SOLN
Freq: Once | INTRAMUSCULAR | Status: AC
  Administered 2015-05-03: 12:00:00 via INTRAPLEURAL
  Filled 2015-05-03: qty 10

## 2015-05-03 NOTE — Progress Notes (Signed)
10mg  tPA in 60ml NS instilled into left chest tube.  Tube clamped.  Sterile technique of tube maintained.  Patient turned to the right side.     Plan: Turn patient back to left side at 1:30 PM Unclamp chest tube at 2:05 PM Follow up CXR in am    Noe Gens, NP-C [facility] Pgr: [phone] or if no answer [phone] 05/03/2015, 1:15 PM

## 2015-05-03 NOTE — Progress Notes (Signed)
Initial Nutrition Assessment  DOCUMENTATION CODES:   Severe malnutrition in context of chronic illness  INTERVENTION:  -Ensure Enlive po BID, each supplement provides 350 kcal and 20 grams of protein -RD continue to monitor  NUTRITION DIAGNOSIS:   Malnutrition related to chronic illness as evidenced by moderate depletion of body fat, severe depletion of muscle mass.  GOAL:   Patient will meet greater than or equal to 90% of their needs  MONITOR:   PO intake, I & O's, Labs, Supplement acceptance  REASON FOR ASSESSMENT:   Malnutrition Screening Tool    ASSESSMENT:   With a history of hypertension, hyperlipidemia, aortic stenosis without repair, that presented to the emergency department with complaints of shortness of breath, and cough along with generalized weakness. Symptoms started a couple weeks ago however resolved after a few days. One week later, patient started to have cough and feeling weak  Spoke with [name] at bedside. He endorses poor appetite for 1 week. Denies weight loss  Per chart wt stable x2 years  Denies chewing/swallowing problems. He did have a tray in the room during visit; approximately 40% meal completion -> states his appetite has not come back yet. Claims he had an ensure and enjoyed it. Will continue to provide until appetite returns.  Nutrition-Focused physical exam completed. Findings are moderate fat depletion, severe muscle depletion, and no edema.   Encouraged pt to eat as much as could.  Labs and Medications reviewed.  Diet Order:  Diet Heart Room service appropriate?: Yes; Fluid consistency:: Thin  Skin:  Reviewed, no issues  Last BM:  4/11  Height:   Ht Readings from Last 1 Encounters:  04/29/15 5\' 6"  (1.676 m)    Weight:   Wt Readings from Last 1 Encounters:  04/29/15 161 lb 6 oz (73.2 kg)    Ideal Body Weight:  75.45 kg  BMI:  Body mass index is 26.06 kg/(m^2).  Estimated Nutritional Needs:   Kcal:   1800-2100  Protein:  75-90 grams  Fluid:  >/= 1.8L  EDUCATION NEEDS:   No education needs identified at this time  [name]. Allen Gierke, MS, RD LDN After Hours/Weekend Pager [phone]

## 2015-05-03 NOTE — Progress Notes (Signed)
Patient ID: [name], male   DOB: 01-13-1927, 82 y.o.   MRN: [id number]    Referring Physician(s): Kara Mead  Supervising Physician: Markus Daft  Chief Complaint: Left empyema  Subjective: Patient feels some better today.  Allergies: Review of patient's allergies indicates no known allergies.  Medications: Prior to Admission medications   Medication Sig Start Date End Date Taking? Authorizing Provider  amLODipine (NORVASC) 10 MG tablet Take 1 tablet (10 mg total) by mouth daily. 03/08/15  Yes Jettie Booze, MD  amoxicillin (AMOXIL) 500 MG capsule Take 500 mg by mouth 3 (three) times daily as needed (dental procedure). PRIOR TO DENTAL PROCEDURE.   Yes Historical Provider, MD  aspirin EC 81 MG tablet Take 81 mg by mouth daily.   Yes Historical Provider, MD  Multiple Vitamins-Minerals (MULTIVITAMIN WITH MINERALS) tablet Take 1 tablet by mouth daily.   Yes Historical Provider, MD  pravastatin (PRAVACHOL) 20 MG tablet TAKE 1 TABLET BY MOUTH EVERY DAY 03/08/15  Yes Jettie Booze, MD  lisinopril (PRINIVIL,ZESTRIL) 20 MG tablet Take 2 tablets (40 mg total) by mouth daily. Patient not taking: Reported on 04/25/2015 03/08/15   Jettie Booze, MD    Vital Signs: BP 137/61 mmHg  Pulse 78  Temp(Src) 97.6 F (36.4 C) (Oral)  Resp 20  Ht 5\' 6"  (1.676 m)  Wt 161 lb 6 oz (73.2 kg)  BMI 26.06 kg/m2  SpO2 91%  Physical Exam: Lungs: drain in good position, draining well, 850cc yesterday.  Drain site is c/d/i.  Better air movement today in the LL chest.  Imaging: Dg Chest 1 View  04/30/2015  CLINICAL DATA:  Left thoracentesis EXAM: CHEST 1 VIEW COMPARISON:  04/29/2015 FINDINGS: Moderately large left effusion, decreased in size from yesterday. No pneumothorax. Left lower lobe collapse Right lung clear IMPRESSION: Moderately large left effusion remains following thoracentesis. No pneumothorax. Electronically Signed   By: Franchot Gallo M.D.   On: 04/30/2015 12:48   Dg  Chest 2 View  04/29/2015  CLINICAL DATA:  82 year old with recent onset of cough and delays. EXAM: CHEST  2 VIEW COMPARISON:  11/05/2013 and earlier. FINDINGS: Prior sternotomy for aortic valve replacement and CABG. Cardiac silhouette likely normal in size, partially obscured by a large left pleural effusion. Associated dense consolidation in the left lower lobe and left upper lobe. Right lung clear. Pulmonary vascularity normal. No right pleural effusion. Degenerative changes and DISH involving the thoracic spine. IMPRESSION: Large left pleural effusion and associated dense passive atelectasis and/or pneumonia in the left lower lobe and left upper lobe. Electronically Signed   By: Evangeline Dakin M.D.   On: 04/29/2015 14:53   Ct Chest Wo Contrast  04/30/2015  CLINICAL DATA:  Shortness of breath and cough with generalized weakness beginning several weeks ago. Left pleural effusion on current chest radiograph. EXAM: CT CHEST WITHOUT CONTRAST TECHNIQUE: Multidetector CT imaging of the chest was performed following the standard protocol without IV contrast. COMPARISON:  Chest radiograph, 04/30/2015. FINDINGS: Neck base and axilla:  No mass or adenopathy. Mediastinum and hila: Heart is normal in size. There changes from cardiac surgery and aortic valve replacement. Coronary artery and aortic atherosclerotic calcifications are noted. No mediastinal or hilar masses or enlarged lymph nodes. Lungs and pleura: Large left pleural effusion. There is significant atelectasis of the left lower lobe. There is atelectasis of a portion of the left upper lobe lingula with some minor dependent subsegmental atelectasis in the posterior left upper lobe. A component of left lung  base pneumonia is possible. Minimal right pleural effusion with mild dependent right lower lobe subsegmental atelectasis. No convincing pneumonia and no pulmonary edema. No pneumothorax. Limited upper abdomen:  No acute findings. Musculoskeletal: Degenerative  changes throughout the visualized spine. Bones demineralized. No osteoblastic or osteolytic lesions. IMPRESSION: 1. Large left pleural effusion with significant left lower lobe and left upper lobe lingula atelectasis. Consider pneumonia as a component of the parenchymal opacity given the patient's history. 2. Minimal right pleural effusion with associated dependent right lower lobe subsegmental atelectasis. 3. No pulmonary edema. Electronically Signed   By: Lajean Manes M.D.   On: 04/30/2015 15:28   Dg Chest Port 1 View  05/03/2015  CLINICAL DATA:  Cough, left effusion EXAM: PORTABLE CHEST 1 VIEW COMPARISON:  CT chest of 04/30/2015, and chest x-ray of 04/30/2015 FINDINGS: The left chest tube is present and there has been a slight decrease in volume of the left pleural effusion with probable left basilar atelectasis. The right lung is clear. Cardiomegaly is stable and median sternotomy sutures are noted with aortic valve replacement present. IMPRESSION: 1. Left chest tube present with slight decrease in volume of the left pleural effusion. 2. Stable cardiomegaly. Electronically Signed   By: Ivar Drape M.D.   On: 05/03/2015 08:13   Ct Image Guided Drainage By Percutaneous Catheter  05/02/2015  CLINICAL DATA:  Left lung pneumonia with large left pleural empyema. Prior thoracentesis on 04/30/2015 demonstrated fluid consistent with empyema. The patient is not a candidate currently for surgical intervention with VATS. Request has been made to place a percutaneous drainage catheter into the left empyema. EXAM: CT GUIDED DRAINAGE OF LEFT PLEURAL EMPYEMA ANESTHESIA/SEDATION: 0.5 Mg IV Versed 25 mcg IV Fentanyl Total Moderate Sedation Time:  20 minutes. The patient's level of consciousness and physiologic status were continuously monitored during the procedure by Radiology nursing. PROCEDURE: The procedure, risks, benefits, and alternatives were explained to the patient. Questions regarding the procedure were  encouraged and answered. The patient understands and consents to the procedure. A time-out was performed prior to initiating the procedure. The left chest wall was prepped with chlorhexidine in a sterile fashion, and a sterile drape was applied covering the operative field. A sterile gown and sterile gloves were used for the procedure. Local anesthesia was provided with 1% Lidocaine. CT was performed in a supine position. After localizing the left pleural collection, an 18 gauge trocar needle was advanced into the left pleural space from an anterior approach. After return of fluid, a guidewire was advanced through the needle. The tract was dilated over the wire and a 14 French pigtail drainage catheter advanced. Catheter positioning was confirmed by CT. The catheter was connected to a [place] Pleur-Evac device. It was secured at the skin with a Prolene retention suture, StatLock device and overlying dressing. COMPLICATIONS: None FINDINGS: Initial CT shows significant reaccumulation of a large left pleural effusion which is loculated and has been demonstrated to be consistent with empyema after prior sampling. There is significant consolidation of the left lung. Fluid return demonstrates mildly turbid yellowish fluid from the left pleural space. A 14 French pigtail catheter was placed and is draining well after placement. IMPRESSION: CT-guided left pleural drainage catheter placement to treat empyema. A 14 French pigtail drainage catheter was placed. The catheter was connected to a [place] Pleur-Evac device and will be connected to wall suction at -20 cm of water. Electronically Signed   By: Aletta Edouard M.D.   On: 05/02/2015 16:44   US Thoracentesis  Asp Pleural Space W/img Guide  04/30/2015  INDICATION: 82 year old male who was admitted through the emergency department with sepsis secondary to community acquired pneumonia. The patient was found have a large left-sided pleural effusion on chest x-ray. Request has  been made for a therapeutic and diagnostic thoracentesis. EXAM: ULTRASOUND GUIDED DIAGNOSTIC AND THERAPEUTIC THORACENTESIS MEDICATIONS: 1% lidocaine COMPLICATIONS: None immediate. PROCEDURE: An ultrasound guided thoracentesis was thoroughly discussed with the patient and questions answered. The benefits, risks, alternatives and complications were also discussed. The patient understands and wishes to proceed with the procedure. Written consent was obtained. Ultrasound was performed to localize and mark an adequate pocket of fluid in the left chest. The area was then prepped and draped in the normal sterile fashion. 1% Lidocaine was used for local anesthesia. Under ultrasound guidance a Safe-T-Centesis catheter was introduced. Thoracentesis was performed. The procedure was stopped after 1-1/2 L of fluid was obtained. This is standard procedure for a first thoracentesis and the patient had an elevated creatinine as well at 1.56. The catheter was removed and a dressing applied. FINDINGS: A total of approximately 1.5 L of cloudy amber fluid was removed. Multiple loculations were noted on ultrasound. After fluid was removed, the final image revealed that a significant portion of the effusion was removed with this procedure. Samples were sent to the laboratory as requested by the clinical team. IMPRESSION: Successful ultrasound guided left thoracentesis yielding 1.5 L of pleural fluid. Read by: Saverio Danker, PA-C Electronically Signed   By: Jacqulynn Cadet M.D.   On: 04/30/2015 12:46    Labs:  CBC:  Recent Labs  04/29/15 1429 04/30/15 0441 05/02/15 0442  WBC 30.7* 31.5* 22.7*  HGB 11.1* 9.9* 9.3*  HCT 32.1* 27.8* 27.0*  PLT 457* 400 407*    COAGS:  Recent Labs  04/29/15 1818 05/02/15 1313  INR 1.72* 1.59*  APTT 46* 41*    BMP:  Recent Labs  04/29/15 1429 04/30/15 0441 05/02/15 0442  NA 134* 135 137  K 4.2 4.8 4.0  CL 101 112* 108  CO2 22 19* 21*  GLUCOSE 205* 146* 153*  BUN 34*  30* 26*  CALCIUM 8.6* 8.4* 8.0*  CREATININE 1.91* 1.52* 1.20  GFRNONAA 30* 40* 53*  GFRAA 35* 46* >60    LIVER FUNCTION TESTS:  Recent Labs  04/29/15 1429  BILITOT 1.2  AST 25  ALT 24  ALKPHOS 92  PROT 7.4  ALBUMIN 2.9*    Assessment and Plan: 1. Left empyema, s/p drain placement 4/12, [place] -cont with drain right now -further plans per pulmonary -will follow  Electronically Signed: Rainen Vanrossum E 05/03/2015, 11:40 AM   I spent a total of 15 Minutes at the the patient's bedside AND on the patient's hospital floor or unit, greater than 50% of which was counseling/coordinating care for left empyema

## 2015-05-03 NOTE — Progress Notes (Signed)
[facility] PROGRESS NOTE    Progress Note   [name] [id number] DOB: 12-08-1926 DOA: 04/29/2015 PCP: Irven Shelling, MD   Brief Narrative:   [name] is an 82 y.o. male past medical history of aortic stenosis without repair the comes to the ED complaining of shortness of breath cough and generalized weakness  Assessment/Plan:   Severe sepsis due to community-acquired pneumonia: Cont empiric antibiotics azithromycin and Rocephin started on 4.9.2017 His kidney function has improved with IV fluids.  He has remained afebrile . KVO IV fluid hydration. 1/2 BC posotive coagulase negative possible contaminate.  Large right pleural effusion: Interventional radiologist was consulted for thoracocentesis that showed exudate Repeated CT scan showed loculation, PCCM was consulted and Recommedned Chest tube placement by IR as he is not a surgical candidate due to his multiple medical comorbidities. Chest tube placement performed on 05/02/2015 with intermittent suction. Further management per IR and critical care.  Acute on chronic kidney disease stage III: Likely due to sepsis, his baseline creatinine is 1 on admission it was 1.9 has improved to 1.5 with IV fluids. His Cr is now back to baseline.  Generalized weakness: Physical therapy evaluated the patient at that recommended PT at home.  Normocytic anemia: Hemoglobin stable.  Essential hypertension: Hold amlodipine.  Tobacco abuse: Counseling was done.  History of aortic stenosis: Continue aspirin.  Aortic valve disorder: Previous 2-D echo done on 03/28/2008 show severe aortic stenosis with valve area of 0.45 cm squares by VTI.     DVT Prophylaxis - Lovenox ordered.  Family Communication: none Disposition Plan: Home 3 day Code Status:     Code Status Orders        Start     Ordered   04/29/15 1759  Full code   Continuous     04/29/15 1758    Code Status History    Date Active Date  Inactive Code Status Order ID Comments User Context   This patient has a current code status but no historical code status.    Advance Directive Documentation        Most Recent Value   Type of Advance Directive  Out of facility DNR (pink MOST or yellow form)   Pre-existing out of facility DNR order (yellow form or pink MOST form)     "MOST" Form in Place?          IV Access:    Peripheral IV   Procedures and diagnostic studies:   Dg Chest Port 1 View  May 28, 2015  CLINICAL DATA:  Cough, left effusion EXAM: PORTABLE CHEST 1 VIEW COMPARISON:  CT chest of 04/30/2015, and chest x-ray of 04/30/2015 FINDINGS: The left chest tube is present and there has been a slight decrease in volume of the left pleural effusion with probable left basilar atelectasis. The right lung is clear. Cardiomegaly is stable and median sternotomy sutures are noted with aortic valve replacement present. IMPRESSION: 1. Left chest tube present with slight decrease in volume of the left pleural effusion. 2. Stable cardiomegaly. Electronically Signed   By: Ivar Drape M.D.   On: 05-28-15 08:13   Ct Image Guided Drainage By Percutaneous Catheter  05/02/2015  CLINICAL DATA:  Left lung pneumonia with large left pleural empyema. Prior thoracentesis on 04/30/2015 demonstrated fluid consistent with empyema. The patient is not a candidate currently for surgical intervention with VATS. Request has been made to place a percutaneous drainage catheter into the left empyema. EXAM: CT GUIDED DRAINAGE OF LEFT PLEURAL EMPYEMA  ANESTHESIA/SEDATION: 0.5 Mg IV Versed 25 mcg IV Fentanyl Total Moderate Sedation Time:  20 minutes. The patient's level of consciousness and physiologic status were continuously monitored during the procedure by Radiology nursing. PROCEDURE: The procedure, risks, benefits, and alternatives were explained to the patient. Questions regarding the procedure were encouraged and answered. The patient understands and  consents to the procedure. A time-out was performed prior to initiating the procedure. The left chest wall was prepped with chlorhexidine in a sterile fashion, and a sterile drape was applied covering the operative field. A sterile gown and sterile gloves were used for the procedure. Local anesthesia was provided with 1% Lidocaine. CT was performed in a supine position. After localizing the left pleural collection, an 18 gauge trocar needle was advanced into the left pleural space from an anterior approach. After return of fluid, a guidewire was advanced through the needle. The tract was dilated over the wire and a 14 French pigtail drainage catheter advanced. Catheter positioning was confirmed by CT. The catheter was connected to a [place] Pleur-Evac device. It was secured at the skin with a Prolene retention suture, StatLock device and overlying dressing. COMPLICATIONS: None FINDINGS: Initial CT shows significant reaccumulation of a large left pleural effusion which is loculated and has been demonstrated to be consistent with empyema after prior sampling. There is significant consolidation of the left lung. Fluid return demonstrates mildly turbid yellowish fluid from the left pleural space. A 14 French pigtail catheter was placed and is draining well after placement. IMPRESSION: CT-guided left pleural drainage catheter placement to treat empyema. A 14 French pigtail drainage catheter was placed. The catheter was connected to a [place] Pleur-Evac device and will be connected to wall suction at -20 cm of water. Electronically Signed   By: Aletta Edouard M.D.   On: 05/02/2015 16:44     Medical Consultants:    None.  Anti-Infectives:   Anti-infectives    Start     Dose/Rate Route Frequency Ordered Stop   05/01/15 1600  cefTRIAXone (ROCEPHIN) 2 g in dextrose 5 % 50 mL IVPB     2 g 100 mL/hr over 30 Minutes Intravenous Every 24 hours 05/01/15 1347 05/07/15 1559   04/30/15 1600  cefTRIAXone (ROCEPHIN) 1 g  in dextrose 5 % 50 mL IVPB  Status:  Discontinued     1 g 100 mL/hr over 30 Minutes Intravenous Every 24 hours 04/29/15 1758 05/01/15 1347   04/30/15 1600  azithromycin (ZITHROMAX) 500 mg in dextrose 5 % 250 mL IVPB     500 mg 250 mL/hr over 60 Minutes Intravenous Every 24 hours 04/29/15 1758 05/07/15 1559   04/29/15 1515  cefTRIAXone (ROCEPHIN) 1 g in dextrose 5 % 50 mL IVPB     1 g 100 mL/hr over 30 Minutes Intravenous  Once 04/29/15 1514 04/29/15 1647   04/29/15 1515  azithromycin (ZITHROMAX) 500 mg in dextrose 5 % 250 mL IVPB     500 mg 250 mL/hr over 60 Minutes Intravenous  Once 04/29/15 1514 04/29/15 1718      Subjective:    [name] he relates his breathing is unchanged today.  Objective:    Filed Vitals:   05/02/15 1735 05/02/15 1805 05/02/15 2051 05/03/15 0534  BP: 131/50 135/53 145/67 139/57  Pulse: 86 78 82 78  Temp: 99.2 F (37.3 C) 98.2 F (36.8 C) 99.2 F (37.3 C) 97.6 F (36.4 C)  TempSrc: Oral Oral Oral Oral  Resp: 20 20 20 20   Height:  Weight:      SpO2: 95% 95% 92% 91%    Intake/Output Summary (Last 24 hours) at 05/03/15 0901 Last data filed at 05/03/15 0700  Gross per 24 hour  Intake    350 ml  Output   1700 ml  Net  -1350 ml   Filed Weights   04/29/15 2024  Weight: 73.2 kg (161 lb 6 oz)    Exam: Gen:  NAD Cardiovascular:  RRR. Chest and lungs:   Good air movement with decreased sounds in the left lung. Abdomen:  Abdomen soft, NT/ND, + BS Extremities:  No edema   Data Reviewed:    Labs: Basic Metabolic Panel:  Recent Labs Lab 04/29/15 1429 04/30/15 0441 05/02/15 0442  NA 134* 135 137  K 4.2 4.8 4.0  CL 101 112* 108  CO2 22 19* 21*  GLUCOSE 205* 146* 153*  BUN 34* 30* 26*  CREATININE 1.91* 1.52* 1.20  CALCIUM 8.6* 8.4* 8.0*   GFR Estimated Creatinine Clearance: 39.9 mL/min (by C-G formula based on Cr of 1.2). Liver Function Tests:  Recent Labs Lab 04/29/15 1429  AST 25  ALT 24  ALKPHOS 92  BILITOT  1.2  PROT 7.4  ALBUMIN 2.9*   No results for input(s): LIPASE, AMYLASE in the last 168 hours. No results for input(s): AMMONIA in the last 168 hours. Coagulation profile  Recent Labs Lab 04/29/15 1818 05/02/15 1313  INR 1.72* 1.59*    CBC:  Recent Labs Lab 04/29/15 1429 04/30/15 0441 05/02/15 0442  WBC 30.7* 31.5* 22.7*  NEUTROABS 28.1*  --   --   HGB 11.1* 9.9* 9.3*  HCT 32.1* 27.8* 27.0*  MCV 87.5 86.9 86.8  PLT 457* 400 407*   Cardiac Enzymes: No results for input(s): CKTOTAL, CKMB, CKMBINDEX, TROPONINI in the last 168 hours. BNP (last 3 results) No results for input(s): PROBNP in the last 8760 hours. CBG: No results for input(s): GLUCAP in the last 168 hours. D-Dimer: No results for input(s): DDIMER in the last 72 hours. Hgb A1c: No results for input(s): HGBA1C in the last 72 hours. Lipid Profile: No results for input(s): CHOL, HDL, LDLCALC, TRIG, CHOLHDL, LDLDIRECT in the last 72 hours. Thyroid function studies: No results for input(s): TSH, T4TOTAL, T3FREE, THYROIDAB in the last 72 hours.  Invalid input(s): FREET3 Anemia work up: No results for input(s): VITAMINB12, FOLATE, FERRITIN, TIBC, IRON, RETICCTPCT in the last 72 hours. Sepsis Labs:  Recent Labs Lab 04/29/15 1429 04/29/15 1438 04/29/15 1818 04/29/15 2050 04/30/15 0441 05/02/15 0442  PROCALCITON  --   --  0.51  --   --   --   WBC 30.7*  --   --   --  31.5* 22.7*  LATICACIDVEN  --  3.47* 1.6 2.2*  --   --    Microbiology Recent Results (from the past 240 hour(s))  Blood culture (routine x 2)     Status: Abnormal   Collection Time: 04/29/15  4:00 PM  Result Value Ref Range Status   Specimen Description BLOOD RIGHT ANTECUBITAL  Final   Special Requests IN PEDIATRIC BOTTLE 5CC  Final   Culture  Setup Time   Final    GRAM POSITIVE COCCI IN CLUSTERS IN PEDIATRIC BOTTLE CRITICAL RESULT CALLED TO, READ BACK BY AND VERIFIED WITH: M. Fears RN 12:35 04/30/15  ([email or web address])    Culture (A)  Final     STAPHYLOCOCCUS SPECIES (COAGULASE NEGATIVE) THE SIGNIFICANCE OF ISOLATING THIS ORGANISM FROM A SINGLE SET OF BLOOD CULTURES WHEN MULTIPLE SETS  ARE DRAWN IS UNCERTAIN. PLEASE NOTIFY THE MICROBIOLOGY DEPARTMENT WITHIN ONE WEEK IF SPECIATION AND SENSITIVITIES ARE REQUIRED. Performed at [facility]    Report Status 05/01/2015 FINAL  Final  Blood culture (routine x 2)     Status: None (Preliminary result)   Collection Time: 04/29/15  4:10 PM  Result Value Ref Range Status   Specimen Description BLOOD RIGHT HAND  Final   Special Requests BOTTLES DRAWN AEROBIC AND ANAEROBIC 5CC  Final   Culture   Final    NO GROWTH 3 DAYS Performed at [facility]    Report Status PENDING  Incomplete  Culture, body fluid-bottle     Status: None (Preliminary result)   Collection Time: 04/30/15 12:32 PM  Result Value Ref Range Status   Specimen Description FLUID LEFT PLEURAL  Final   Special Requests BOTTLES DRAWN AEROBIC AND ANAEROBIC 10CC  Final   Culture   Final    NO GROWTH 2 DAYS Performed at [facility]    Report Status PENDING  Incomplete  Gram stain     Status: None   Collection Time: 04/30/15 12:32 PM  Result Value Ref Range Status   Specimen Description FLUID LEFT PLEURAL  Final   Special Requests NONE Performed at [facility]   Final   Gram Stain   Final    ABUNDANT WBC PRESENT,BOTH PMN AND MONONUCLEAR NO ORGANISMS SEEN    Report Status 04/30/2015 FINAL  Final     Medications:   . amLODipine  10 mg Oral Daily  . aspirin EC  81 mg Oral Daily  . azithromycin  500 mg Intravenous Q24H  . cefTRIAXone (ROCEPHIN)  IV  2 g Intravenous Q24H  . dextromethorphan-guaiFENesin  2 tablet Oral BID  . multivitamin with minerals  1 tablet Oral Daily  . pravastatin  20 mg Oral q1800   Continuous Infusions: . sodium chloride 10 mL/hr at 05/01/15 0754    Time spent: 15 min   LOS: 4 days   Charlynne Cousins  [facility] Pager [phone]  *Please refer  to [email or web address], password [email or web address] to get updated schedule on who will round on this patient, as hospitalists switch teams weekly. If 7PM-7AM, please contact night-coverage at [email or web address], password [email or web address] for any overnight needs.  05/03/2015, 9:01 AM

## 2015-05-03 NOTE — Consult Note (Signed)
   [facility] CM Inpatient Consult   05/03/2015  [name] [birth year] [id number]   Patient screened for [place] Management services. Went to bedside to offer and explain [facility] Care Management program with patient. Patient declined [facility] follow up. Accepted [facility] Care Management brochure with contact information to call in future if changes mind. Made inpatient RNCM aware that patient declined [facility] Management program services.  Marthenia Rolling, MSN-Ed, RN,BSN [facility] Liaison [phone]
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
## 2015-05-04 ENCOUNTER — Inpatient Hospital Stay (HOSPITAL_COMMUNITY): Payer: PPO

## 2015-05-04 DIAGNOSIS — I10 Essential (primary) hypertension: Secondary | ICD-10-CM | POA: Diagnosis not present

## 2015-05-04 DIAGNOSIS — Z4682 Encounter for fitting and adjustment of non-vascular catheter: Secondary | ICD-10-CM | POA: Diagnosis not present

## 2015-05-04 DIAGNOSIS — N179 Acute kidney failure, unspecified: Secondary | ICD-10-CM | POA: Diagnosis not present

## 2015-05-04 DIAGNOSIS — J869 Pyothorax without fistula: Secondary | ICD-10-CM | POA: Diagnosis not present

## 2015-05-04 DIAGNOSIS — A419 Sepsis, unspecified organism: Secondary | ICD-10-CM | POA: Diagnosis not present

## 2015-05-04 DIAGNOSIS — J189 Pneumonia, unspecified organism: Secondary | ICD-10-CM | POA: Diagnosis not present

## 2015-05-04 DIAGNOSIS — I517 Cardiomegaly: Secondary | ICD-10-CM | POA: Diagnosis not present

## 2015-05-04 LAB — BASIC METABOLIC PANEL
ANION GAP: 8 (ref 5–15)
BUN: 20 mg/dL (ref 6–20)
CALCIUM: 7.8 mg/dL — AB (ref 8.9–10.3)
CO2: 22 mmol/L (ref 22–32)
Chloride: 107 mmol/L (ref 101–111)
Creatinine, Ser: 1.04 mg/dL (ref 0.61–1.24)
GFR calc Af Amer: >60 mL/min (ref >60)
GLUCOSE: 139 mg/dL — AB (ref 65–99)
Potassium: 4 mmol/L (ref 3.5–5.1)
SODIUM: 137 mmol/L (ref 135–145)

## 2015-05-04 LAB — CBC
HCT: 29.9 % — ABNORMAL LOW (ref 39.0–52.0)
HEMOGLOBIN: 10.2 g/dL — AB (ref 13.0–17.0)
MCH: 29.8 pg (ref 26.0–34.0)
MCHC: 34.1 g/dL (ref 30.0–36.0)
MCV: 87.4 fL (ref 78.0–100.0)
Platelets: 467 10*3/uL — ABNORMAL HIGH (ref 150–400)
RBC: 3.42 MIL/uL — ABNORMAL LOW (ref 4.22–5.81)
RDW: 13.2 % (ref 11.5–15.5)
WBC: 16 10*3/uL — ABNORMAL HIGH (ref 4.0–10.5)

## 2015-05-04 LAB — CULTURE, BLOOD (ROUTINE X 2): CULTURE: NO GROWTH

## 2015-05-04 MED ORDER — QUETIAPINE FUMARATE 25 MG PO TABS: 25.0000 mg | ORAL_TABLET | Freq: Every evening | ORAL | Status: DC | PRN

## 2015-05-04 MED ORDER — DEXTROSE 5 % IV SOLN
1.0000 g | INTRAVENOUS | Status: AC
Start: 2015-05-04 — End: 2015-05-06
  Administered 2015-05-04 – 2015-05-06 (×3): 1 g via INTRAVENOUS
  Filled 2015-05-04 (×3): qty 10

## 2015-05-04 NOTE — Progress Notes (Signed)
Name: [name] MRN: [id number] DOB: 04-14-1926    ADMISSION DATE:  04/29/2015 CONSULTATION DATE:  05/02/15  REFERRING MD :  Dr. Olevia Bowens  CHIEF COMPLAINT:  PNA, Effusion    HISTORY OF PRESENT ILLNESS:  82 year old man with cold symptoms about 4 weeks ago and failure to thrive presented with large left pleural effusion Underwent 1.5 L thoracenteses-fluid results consistent with neutrophilic exudate, low glucose seems to favor empyema, Gram stain is negative     PMH of HTN, HLD, CAD s/p 2 vessel CABG, severe aortic stenosis s/p AVR (tissue), BPH, memory loss (followed by Dr. Jannifer Franklin, in past has not wanted medications), and diverticulosis     SUBJECTIVE:  oob to chair Afebrile Denies pain, cough 800 cc drained via chest tube  VITAL SIGNS: Temp:  [97.6 F (36.4 C)-98.9 F (37.2 C)] 97.6 F (36.4 C) (04/14 0530) Pulse Rate:  [77-82] 77 (04/14 0530) Resp:  [20-22] 20 (04/14 0530) BP: (134-140)/(53-64) 139/58 mmHg (04/14 0939) SpO2:  [91 %-93 %] 91 % (04/14 0530)  PHYSICAL EXAMINATION: General:  Elderly male in NAD Neuro:  Oriented, MAE, generalized weakness, calm HEENT:  MM pink/moist, no jvd Cardiovascular:  s1s2 rrr, no m/r/g  Lungs:  Even/non-labored, lungs  diminished on left Abdomen:  Obese/soft, bsx4 active  Musculoskeletal:  No acute deformities  Skin:  Warm/dry, no edema    Recent Labs Lab 04/30/15 0441 05/02/15 0442 05/04/15 0454  NA 135 137 137  K 4.8 4.0 4.0  CL 112* 108 107  CO2 19* 21* 22  BUN 30* 26* 20  CREATININE 1.52* 1.20 1.04  GLUCOSE 146* 153* 139*    Recent Labs Lab 04/30/15 0441 05/02/15 0442 05/04/15 0454  HGB 9.9* 9.3* 10.2*  HCT 27.8* 27.0* 29.9*  WBC 31.5* 22.7* 16.0*  PLT 400 407* 467*   Dg Chest Port 1 View  05/04/2015  CLINICAL DATA:  Chest tube. EXAM: PORTABLE CHEST 1 VIEW COMPARISON:  05/03/2015.  CT 04/30/2015 . FINDINGS: Left chest tube in stable position. Pleural fluid collection on the left again noted. Slight  decrease in size. Loculation may be present. Left lower lobe atelectasis and or infiltrate . No prominent pneumothorax. Mild pleural air collection along the left base of the along the chest tube courses cannot be entirely excluded . Right lung is clear. Prior CABG and cardiac valve replacement. Stable cardiomegaly. No pulmonary venous congestion. IMPRESSION: 1. Left chest tube in stable position. Left pleural fluid collection has decreased in size. A component of loculation may be present. Left lower lobe atelectasis and/or infiltrate. Small amount of pleural air along the left base along the course of the left chest tube cannot be excluded. Continued follow-up exam suggested. 2.  Prior CABG.  Cardiac valve replacement.  Stable cardiomegaly. Critical Value/emergent results were called by telephone at the time of interpretation on 05/04/2015 at 7:19 am to nurse Lenell Antu , who verbally acknowledged these results. Electronically Signed   By: Marcello Moores  Register   On: 05/04/2015 07:22   Dg Chest Port 1 View  05/03/2015  CLINICAL DATA:  Cough, left effusion EXAM: PORTABLE CHEST 1 VIEW COMPARISON:  CT chest of 04/30/2015, and chest x-ray of 04/30/2015 FINDINGS: The left chest tube is present and there has been a slight decrease in volume of the left pleural effusion with probable left basilar atelectasis. The right lung is clear. Cardiomegaly is stable and median sternotomy sutures are noted with aortic valve replacement present. IMPRESSION: 1. Left chest tube present with slight decrease in volume  of the left pleural effusion. 2. Stable cardiomegaly. Electronically Signed   By: Ivar Drape M.D.   On: 05/03/2015 08:13   Ct Image Guided Drainage By Percutaneous Catheter  05/02/2015  CLINICAL DATA:  Left lung pneumonia with large left pleural empyema. Prior thoracentesis on 04/30/2015 demonstrated fluid consistent with empyema. The patient is not a candidate currently for surgical intervention with VATS. Request has been  made to place a percutaneous drainage catheter into the left empyema. EXAM: CT GUIDED DRAINAGE OF LEFT PLEURAL EMPYEMA ANESTHESIA/SEDATION: 0.5 Mg IV Versed 25 mcg IV Fentanyl Total Moderate Sedation Time:  20 minutes. The patient's level of consciousness and physiologic status were continuously monitored during the procedure by Radiology nursing. PROCEDURE: The procedure, risks, benefits, and alternatives were explained to the patient. Questions regarding the procedure were encouraged and answered. The patient understands and consents to the procedure. A time-out was performed prior to initiating the procedure. The left chest wall was prepped with chlorhexidine in a sterile fashion, and a sterile drape was applied covering the operative field. A sterile gown and sterile gloves were used for the procedure. Local anesthesia was provided with 1% Lidocaine. CT was performed in a supine position. After localizing the left pleural collection, an 18 gauge trocar needle was advanced into the left pleural space from an anterior approach. After return of fluid, a guidewire was advanced through the needle. The tract was dilated over the wire and a 14 French pigtail drainage catheter advanced. Catheter positioning was confirmed by CT. The catheter was connected to a [place] Pleur-Evac device. It was secured at the skin with a Prolene retention suture, StatLock device and overlying dressing. COMPLICATIONS: None FINDINGS: Initial CT shows significant reaccumulation of a large left pleural effusion which is loculated and has been demonstrated to be consistent with empyema after prior sampling. There is significant consolidation of the left lung. Fluid return demonstrates mildly turbid yellowish fluid from the left pleural space. A 14 French pigtail catheter was placed and is draining well after placement. IMPRESSION: CT-guided left pleural drainage catheter placement to treat empyema. A 14 French pigtail drainage catheter was  placed. The catheter was connected to a [place] Pleur-Evac device and will be connected to wall suction at -20 cm of water. Electronically Signed   By: Aletta Edouard M.D.   On: 05/02/2015 16:44     SIGNIFICANT EVENTS  4/09  Admit  4/10  L Thora with 1.5L removed, CT post shows residual large effusion, concern for loculation / empyema 4/12 lt chest tube by IR  4/13 tpa via chest tube  STUDIES:  Pleural Fluid 4/10   Glucose <20  Protein 4.5   LD 300 (exudative by Lights)  WBC 12,483 with 90% neutrophils  GS >> abundant wbc  Culture >>   Cytology >> marked acute inflammation, no malignant cells   CT Chest w/o 4/10 >> post thora, large left effusion with significant LLL, LUL atelectasis, minimal R effusion, no edema   CULTURES:  BCx2 4/9 >> 1/2 with coag neg staph  ANTIBIOTICS:  Azithromycin 4/9 >> Rocephin 4/9 >>   ASSESSMENT / PLAN:   Large Left Pleural Effusion - concern for component of loculation / empyema s/p IR chest tube s/p tpa  CAP with Empyema    Plan: Continue abx as outlined > rocephin / azithro  tpa seems to have worked, CXR appears improved ! He is a poor candidate for VATS Monitor serial CXR, will consider rpt tpa O2 as needed for sats >  92% Chest tube to water seal    Delirium - in setting of underlying memory issues + infection      Kara Mead MD. Shade Flood. [facility] Pager [phone] If no response call [phone]    05/04/2015, 10:52 AM

## 2015-05-04 NOTE — Progress Notes (Signed)
The [place] pleur-Evac was full and  new one replaced. Patient tolerated well. Connector intact, securely tape. Tube  patent  without any  kinks. Dsg clean, dry and intact to insertion site.  Continuous at low wall suction at -20cm H20.

## 2015-05-04 NOTE — Progress Notes (Signed)
[facility] PROGRESS NOTE    Progress Note   [name] [id number] DOB: [birth year] DOA: 04/29/2015 PCP: Irven Shelling, MD   Brief Narrative:   [name] is an 82 y.o. male past medical history of aortic stenosis without repair the comes to the ED complaining of shortness of breath cough and generalized weakness  Assessment/Plan:   Severe sepsis due to community-acquired pneumonia: Cont empiric antibiotics azithromycin and Rocephin started on 4.9.2017 His kidney function has improved with IV fluids.  He has remained afebrile cultures remain negative, he except for 1 of 2 blood cultures with coagulase which is probably a contaminant. Probably need to be a long course of antibiotic.  Large left empyema: Interventional radiologist was consulted for thoracocentesis that showed exudate, with a blood glucose less than 20 Repeated CT scan showed loculation, PCCM was consulted and Recommedned  Chest tube placement performed on 05/02/2015 with intermittent suction. tPA was given and it continues to drain I appreciate pulmonary and critical care.  Acute on chronic kidney disease stage III: Likely due to sepsis, his baseline creatinine is 1 on admission it was 1.9 has improved to 1.5 with IV fluids. His Cr is now back to baseline.  Generalized weakness: Physical therapy evaluated the patient at that recommended PT at home.  Normocytic anemia: Hemoglobin stable.  Essential hypertension: Hold amlodipine.  Tobacco abuse: Counseling was done.  History of aortic stenosis: Continue aspirin.  Aortic valve disorder: Previous 2-D echo done on 03/28/2008 show severe aortic stenosis with valve area of 0.45 cm squares by VTI.     DVT Prophylaxis - Lovenox ordered.  Family Communication: none Disposition Plan: Home 2-3 day Code Status:     Code Status Orders        Start     Ordered   04/29/15 1759  Full code   Continuous     04/29/15 1758    Code  Status History    Date Active Date Inactive Code Status Order ID Comments User Context   This patient has a current code status but no historical code status.    Advance Directive Documentation        Most Recent Value   Type of Advance Directive  Out of facility DNR (pink MOST or yellow form)   Pre-existing out of facility DNR order (yellow form or pink MOST form)     "MOST" Form in Place?          IV Access:    Peripheral IV   Procedures and diagnostic studies:   Dg Chest Port 1 View  2015/06/03  CLINICAL DATA:  Chest tube. EXAM: PORTABLE CHEST 1 VIEW COMPARISON:  05/03/2015.  CT 04/30/2015 . FINDINGS: Left chest tube in stable position. Pleural fluid collection on the left again noted. Slight decrease in size. Loculation may be present. Left lower lobe atelectasis and or infiltrate . No prominent pneumothorax. Mild pleural air collection along the left base of the along the chest tube courses cannot be entirely excluded . Right lung is clear. Prior CABG and cardiac valve replacement. Stable cardiomegaly. No pulmonary venous congestion. IMPRESSION: 1. Left chest tube in stable position. Left pleural fluid collection has decreased in size. A component of loculation may be present. Left lower lobe atelectasis and/or infiltrate. Small amount of pleural air along the left base along the course of the left chest tube cannot be excluded. Continued follow-up exam suggested. 2.  Prior CABG.  Cardiac valve replacement.  Stable cardiomegaly. Critical Value/emergent results were  called by telephone at the time of interpretation on 05/04/2015 at 7:19 am to nurse Lenell Antu , who verbally acknowledged these results. Electronically Signed   By: Marcello Moores  Register   On: 05/04/2015 07:22   Dg Chest Port 1 View  05/03/2015  CLINICAL DATA:  Cough, left effusion EXAM: PORTABLE CHEST 1 VIEW COMPARISON:  CT chest of 04/30/2015, and chest x-ray of 04/30/2015 FINDINGS: The left chest tube is present and there has been  a slight decrease in volume of the left pleural effusion with probable left basilar atelectasis. The right lung is clear. Cardiomegaly is stable and median sternotomy sutures are noted with aortic valve replacement present. IMPRESSION: 1. Left chest tube present with slight decrease in volume of the left pleural effusion. 2. Stable cardiomegaly. Electronically Signed   By: Ivar Drape M.D.   On: 05/03/2015 08:13   Ct Image Guided Drainage By Percutaneous Catheter  05/02/2015  CLINICAL DATA:  Left lung pneumonia with large left pleural empyema. Prior thoracentesis on 04/30/2015 demonstrated fluid consistent with empyema. The patient is not a candidate currently for surgical intervention with VATS. Request has been made to place a percutaneous drainage catheter into the left empyema. EXAM: CT GUIDED DRAINAGE OF LEFT PLEURAL EMPYEMA ANESTHESIA/SEDATION: 0.5 Mg IV Versed 25 mcg IV Fentanyl Total Moderate Sedation Time:  20 minutes. The patient's level of consciousness and physiologic status were continuously monitored during the procedure by Radiology nursing. PROCEDURE: The procedure, risks, benefits, and alternatives were explained to the patient. Questions regarding the procedure were encouraged and answered. The patient understands and consents to the procedure. A time-out was performed prior to initiating the procedure. The left chest wall was prepped with chlorhexidine in a sterile fashion, and a sterile drape was applied covering the operative field. A sterile gown and sterile gloves were used for the procedure. Local anesthesia was provided with 1% Lidocaine. CT was performed in a supine position. After localizing the left pleural collection, an 18 gauge trocar needle was advanced into the left pleural space from an anterior approach. After return of fluid, a guidewire was advanced through the needle. The tract was dilated over the wire and a 14 French pigtail drainage catheter advanced. Catheter positioning  was confirmed by CT. The catheter was connected to a [place] Pleur-Evac device. It was secured at the skin with a Prolene retention suture, StatLock device and overlying dressing. COMPLICATIONS: None FINDINGS: Initial CT shows significant reaccumulation of a large left pleural effusion which is loculated and has been demonstrated to be consistent with empyema after prior sampling. There is significant consolidation of the left lung. Fluid return demonstrates mildly turbid yellowish fluid from the left pleural space. A 14 French pigtail catheter was placed and is draining well after placement. IMPRESSION: CT-guided left pleural drainage catheter placement to treat empyema. A 14 French pigtail drainage catheter was placed. The catheter was connected to a [place] Pleur-Evac device and will be connected to wall suction at -20 cm of water. Electronically Signed   By: Aletta Edouard M.D.   On: 05/02/2015 16:44     Medical Consultants:    None.  Anti-Infectives:   Anti-infectives    Start     Dose/Rate Route Frequency Ordered Stop   05/01/15 1600  cefTRIAXone (ROCEPHIN) 2 g in dextrose 5 % 50 mL IVPB     2 g 100 mL/hr over 30 Minutes Intravenous Every 24 hours 05/01/15 1347 05/07/15 1559   04/30/15 1600  cefTRIAXone (ROCEPHIN) 1 g in dextrose 5 %  50 mL IVPB  Status:  Discontinued     1 g 100 mL/hr over 30 Minutes Intravenous Every 24 hours 04/29/15 1758 05/01/15 1347   04/30/15 1600  azithromycin (ZITHROMAX) 500 mg in dextrose 5 % 250 mL IVPB     500 mg 250 mL/hr over 60 Minutes Intravenous Every 24 hours 04/29/15 1758 05/07/15 1559   04/29/15 1515  cefTRIAXone (ROCEPHIN) 1 g in dextrose 5 % 50 mL IVPB     1 g 100 mL/hr over 30 Minutes Intravenous  Once 04/29/15 1514 04/29/15 1647   04/29/15 1515  azithromycin (ZITHROMAX) 500 mg in dextrose 5 % 250 mL IVPB     500 mg 250 mL/hr over 60 Minutes Intravenous  Once 04/29/15 1514 04/29/15 1718      Subjective:    [name] Reason did not have a  good night sleep.  Objective:    Filed Vitals:   05/03/15 1021 05/03/15 1500 05/03/15 2200 05/04/15 0530  BP: 137/61 139/64 134/53 140/60  Pulse:  80 82 77  Temp:  98.9 F (37.2 C) 98.7 F (37.1 C) 97.6 F (36.4 C)  TempSrc:  Oral Oral Oral  Resp:  22 20 20   Height:      Weight:      SpO2:  92% 93% 91%    Intake/Output Summary (Last 24 hours) at 05/04/15 0742 Last data filed at 05/04/15 0707  Gross per 24 hour  Intake 541.17 ml  Output   2550 ml  Net -2008.83 ml   Filed Weights   04/29/15 2024  Weight: 73.2 kg (161 lb 6 oz)    Exam: Gen:  NAD Cardiovascular:  RRR. Chest and lungs:   Good air movement with decreased sounds in the left lung. Abdomen:  Abdomen soft, NT/ND, + BS Extremities:  No edema   Data Reviewed:    Labs: Basic Metabolic Panel:  Recent Labs Lab 04/29/15 1429 04/30/15 0441 05/02/15 0442 05/04/15 0454  NA 134* 135 137 137  K 4.2 4.8 4.0 4.0  CL 101 112* 108 107  CO2 22 19* 21* 22  GLUCOSE 205* 146* 153* 139*  BUN 34* 30* 26* 20  CREATININE 1.91* 1.52* 1.20 1.04  CALCIUM 8.6* 8.4* 8.0* 7.8*   GFR Estimated Creatinine Clearance: 46 mL/min (by C-G formula based on Cr of 1.04). Liver Function Tests:  Recent Labs Lab 04/29/15 1429  AST 25  ALT 24  ALKPHOS 92  BILITOT 1.2  PROT 7.4  ALBUMIN 2.9*   No results for input(s): LIPASE, AMYLASE in the last 168 hours. No results for input(s): AMMONIA in the last 168 hours. Coagulation profile  Recent Labs Lab 04/29/15 1818 05/02/15 1313  INR 1.72* 1.59*    CBC:  Recent Labs Lab 04/29/15 1429 04/30/15 0441 05/02/15 0442 05/04/15 0454  WBC 30.7* 31.5* 22.7* 16.0*  NEUTROABS 28.1*  --   --   --   HGB 11.1* 9.9* 9.3* 10.2*  HCT 32.1* 27.8* 27.0* 29.9*  MCV 87.5 86.9 86.8 87.4  PLT 457* 400 407* 467*   Cardiac Enzymes: No results for input(s): CKTOTAL, CKMB, CKMBINDEX, TROPONINI in the last 168 hours. BNP (last 3 results) No results for input(s): PROBNP in the last  8760 hours. CBG: No results for input(s): GLUCAP in the last 168 hours. D-Dimer: No results for input(s): DDIMER in the last 72 hours. Hgb A1c: No results for input(s): HGBA1C in the last 72 hours. Lipid Profile: No results for input(s): CHOL, HDL, LDLCALC, TRIG, CHOLHDL, LDLDIRECT in the  last 72 hours. Thyroid function studies: No results for input(s): TSH, T4TOTAL, T3FREE, THYROIDAB in the last 72 hours.  Invalid input(s): FREET3 Anemia work up: No results for input(s): VITAMINB12, FOLATE, FERRITIN, TIBC, IRON, RETICCTPCT in the last 72 hours. Sepsis Labs:  Recent Labs Lab 04/29/15 1429 04/29/15 1438 04/29/15 1818 04/29/15 2050 04/30/15 0441 05/02/15 0442 05/04/15 0454  PROCALCITON  --   --  0.51  --   --   --   --   WBC 30.7*  --   --   --  31.5* 22.7* 16.0*  LATICACIDVEN  --  3.47* 1.6 2.2*  --   --   --    Microbiology Recent Results (from the past 240 hour(s))  Blood culture (routine x 2)     Status: Abnormal   Collection Time: 04/29/15  4:00 PM  Result Value Ref Range Status   Specimen Description BLOOD RIGHT ANTECUBITAL  Final   Special Requests IN PEDIATRIC BOTTLE 5CC  Final   Culture  Setup Time   Final    GRAM POSITIVE COCCI IN CLUSTERS IN PEDIATRIC BOTTLE CRITICAL RESULT CALLED TO, READ BACK BY AND VERIFIED WITH: M. Fears RN 12:35 04/30/15  ([email or web address])    Culture (A)  Final    STAPHYLOCOCCUS SPECIES (COAGULASE NEGATIVE) THE SIGNIFICANCE OF ISOLATING THIS ORGANISM FROM A SINGLE SET OF BLOOD CULTURES WHEN MULTIPLE SETS ARE DRAWN IS UNCERTAIN. PLEASE NOTIFY THE MICROBIOLOGY DEPARTMENT WITHIN ONE WEEK IF SPECIATION AND SENSITIVITIES ARE REQUIRED. Performed at [facility]    Report Status 05/01/2015 FINAL  Final  Blood culture (routine x 2)     Status: None (Preliminary result)   Collection Time: 04/29/15  4:10 PM  Result Value Ref Range Status   Specimen Description BLOOD RIGHT HAND  Final   Special Requests BOTTLES DRAWN AEROBIC AND ANAEROBIC 5CC   Final   Culture   Final    NO GROWTH 4 DAYS Performed at [facility]    Report Status PENDING  Incomplete  Culture, body fluid-bottle     Status: None (Preliminary result)   Collection Time: 04/30/15 12:32 PM  Result Value Ref Range Status   Specimen Description FLUID LEFT PLEURAL  Final   Special Requests BOTTLES DRAWN AEROBIC AND ANAEROBIC 10CC  Final   Culture   Final    NO GROWTH 3 DAYS Performed at [facility]    Report Status PENDING  Incomplete  Gram stain     Status: None   Collection Time: 04/30/15 12:32 PM  Result Value Ref Range Status   Specimen Description FLUID LEFT PLEURAL  Final   Special Requests NONE Performed at [facility]   Final   Gram Stain   Final    ABUNDANT WBC PRESENT,BOTH PMN AND MONONUCLEAR NO ORGANISMS SEEN    Report Status 04/30/2015 FINAL  Final     Medications:   . amLODipine  10 mg Oral Daily  . aspirin EC  81 mg Oral Daily  . azithromycin  500 mg Intravenous Q24H  . cefTRIAXone (ROCEPHIN)  IV  2 g Intravenous Q24H  . dextromethorphan-guaiFENesin  2 tablet Oral BID  . feeding supplement (ENSURE ENLIVE)  237 mL Oral BID BM  . multivitamin with minerals  1 tablet Oral Daily  . pravastatin  20 mg Oral q1800   Continuous Infusions: . sodium chloride 10 mL/hr at 05/01/15 0754    Time spent: 15 min   LOS: 5 days   FELIZ Marguarite Arbour  [facility]  Pager [phone]  *Please refer to [email or web address], password [email or web address] to get updated schedule on who will round on this patient, as hospitalists switch teams weekly. If 7PM-7AM, please contact night-coverage at [email or web address], password [email or web address] for any overnight needs.  05/04/2015, 7:42 AM

## 2015-05-04 NOTE — Progress Notes (Signed)
Patient ID: [name], male   DOB: 08-06-1926, 82 y.o.   MRN: [id number]    Referring Physician(s): Kara Mead  Supervising Physician: Daryll Brod  Chief Complaint: Left chest empyema  Subjective: Patient sleeping.  Did not awaken during my exam  Allergies: Review of patient's allergies indicates no known allergies.  Medications: Prior to Admission medications   Medication Sig Start Date End Date Taking? Authorizing Provider  amLODipine (NORVASC) 10 MG tablet Take 1 tablet (10 mg total) by mouth daily. 03/08/15  Yes Jettie Booze, MD  amoxicillin (AMOXIL) 500 MG capsule Take 500 mg by mouth 3 (three) times daily as needed (dental procedure). PRIOR TO DENTAL PROCEDURE.   Yes Historical Provider, MD  aspirin EC 81 MG tablet Take 81 mg by mouth daily.   Yes Historical Provider, MD  Multiple Vitamins-Minerals (MULTIVITAMIN WITH MINERALS) tablet Take 1 tablet by mouth daily.   Yes Historical Provider, MD  pravastatin (PRAVACHOL) 20 MG tablet TAKE 1 TABLET BY MOUTH EVERY DAY 03/08/15  Yes Jettie Booze, MD  lisinopril (PRINIVIL,ZESTRIL) 20 MG tablet Take 2 tablets (40 mg total) by mouth daily. Patient not taking: Reported on 04/25/2015 03/08/15   Jettie Booze, MD    Vital Signs: BP 140/60 mmHg  Pulse 77  Temp(Src) 97.6 F (36.4 C) (Oral)  Resp 20  Ht 5\' 6"  (1.676 m)  Wt 161 lb 6 oz (73.2 kg)  BMI 26.06 kg/m2  SpO2 91%  Physical Exam: Chest: chest tube in good position, draining 950cc serosang output yesterday.  Drain site is c/d/i.  CTAB with anterior auscultation  Imaging: Dg Chest 1 View  04/30/2015  CLINICAL DATA:  Left thoracentesis EXAM: CHEST 1 VIEW COMPARISON:  04/29/2015 FINDINGS: Moderately large left effusion, decreased in size from yesterday. No pneumothorax. Left lower lobe collapse Right lung clear IMPRESSION: Moderately large left effusion remains following thoracentesis. No pneumothorax. Electronically Signed   By: Franchot Gallo M.D.   On:  04/30/2015 12:48   Ct Chest Wo Contrast  04/30/2015  CLINICAL DATA:  Shortness of breath and cough with generalized weakness beginning several weeks ago. Left pleural effusion on current chest radiograph. EXAM: CT CHEST WITHOUT CONTRAST TECHNIQUE: Multidetector CT imaging of the chest was performed following the standard protocol without IV contrast. COMPARISON:  Chest radiograph, 04/30/2015. FINDINGS: Neck base and axilla:  No mass or adenopathy. Mediastinum and hila: Heart is normal in size. There changes from cardiac surgery and aortic valve replacement. Coronary artery and aortic atherosclerotic calcifications are noted. No mediastinal or hilar masses or enlarged lymph nodes. Lungs and pleura: Large left pleural effusion. There is significant atelectasis of the left lower lobe. There is atelectasis of a portion of the left upper lobe lingula with some minor dependent subsegmental atelectasis in the posterior left upper lobe. A component of left lung base pneumonia is possible. Minimal right pleural effusion with mild dependent right lower lobe subsegmental atelectasis. No convincing pneumonia and no pulmonary edema. No pneumothorax. Limited upper abdomen:  No acute findings. Musculoskeletal: Degenerative changes throughout the visualized spine. Bones demineralized. No osteoblastic or osteolytic lesions. IMPRESSION: 1. Large left pleural effusion with significant left lower lobe and left upper lobe lingula atelectasis. Consider pneumonia as a component of the parenchymal opacity given the patient's history. 2. Minimal right pleural effusion with associated dependent right lower lobe subsegmental atelectasis. 3. No pulmonary edema. Electronically Signed   By: Lajean Manes M.D.   On: 04/30/2015 15:28   Dg Chest [facility]  05/04/2015  CLINICAL DATA:  Chest tube. EXAM: PORTABLE CHEST 1 VIEW COMPARISON:  05/03/2015.  CT 04/30/2015 . FINDINGS: Left chest tube in stable position. Pleural fluid collection on the  left again noted. Slight decrease in size. Loculation may be present. Left lower lobe atelectasis and or infiltrate . No prominent pneumothorax. Mild pleural air collection along the left base of the along the chest tube courses cannot be entirely excluded . Right lung is clear. Prior CABG and cardiac valve replacement. Stable cardiomegaly. No pulmonary venous congestion. IMPRESSION: 1. Left chest tube in stable position. Left pleural fluid collection has decreased in size. A component of loculation may be present. Left lower lobe atelectasis and/or infiltrate. Small amount of pleural air along the left base along the course of the left chest tube cannot be excluded. Continued follow-up exam suggested. 2.  Prior CABG.  Cardiac valve replacement.  Stable cardiomegaly. Critical Value/emergent results were called by telephone at the time of interpretation on 05/04/2015 at 7:19 am to nurse Lenell Antu , who verbally acknowledged these results. Electronically Signed   By: Marcello Moores  Register   On: 05/04/2015 07:22   Dg Chest Port 1 View  05/03/2015  CLINICAL DATA:  Cough, left effusion EXAM: PORTABLE CHEST 1 VIEW COMPARISON:  CT chest of 04/30/2015, and chest x-ray of 04/30/2015 FINDINGS: The left chest tube is present and there has been a slight decrease in volume of the left pleural effusion with probable left basilar atelectasis. The right lung is clear. Cardiomegaly is stable and median sternotomy sutures are noted with aortic valve replacement present. IMPRESSION: 1. Left chest tube present with slight decrease in volume of the left pleural effusion. 2. Stable cardiomegaly. Electronically Signed   By: Ivar Drape M.D.   On: 05/03/2015 08:13   Ct Image Guided Drainage By Percutaneous Catheter  05/02/2015  CLINICAL DATA:  Left lung pneumonia with large left pleural empyema. Prior thoracentesis on 04/30/2015 demonstrated fluid consistent with empyema. The patient is not a candidate currently for surgical intervention with  VATS. Request has been made to place a percutaneous drainage catheter into the left empyema. EXAM: CT GUIDED DRAINAGE OF LEFT PLEURAL EMPYEMA ANESTHESIA/SEDATION: 0.5 Mg IV Versed 25 mcg IV Fentanyl Total Moderate Sedation Time:  20 minutes. The patient's level of consciousness and physiologic status were continuously monitored during the procedure by Radiology nursing. PROCEDURE: The procedure, risks, benefits, and alternatives were explained to the patient. Questions regarding the procedure were encouraged and answered. The patient understands and consents to the procedure. A time-out was performed prior to initiating the procedure. The left chest wall was prepped with chlorhexidine in a sterile fashion, and a sterile drape was applied covering the operative field. A sterile gown and sterile gloves were used for the procedure. Local anesthesia was provided with 1% Lidocaine. CT was performed in a supine position. After localizing the left pleural collection, an 18 gauge trocar needle was advanced into the left pleural space from an anterior approach. After return of fluid, a guidewire was advanced through the needle. The tract was dilated over the wire and a 14 French pigtail drainage catheter advanced. Catheter positioning was confirmed by CT. The catheter was connected to a [place] Pleur-Evac device. It was secured at the skin with a Prolene retention suture, StatLock device and overlying dressing. COMPLICATIONS: None FINDINGS: Initial CT shows significant reaccumulation of a large left pleural effusion which is loculated and has been demonstrated to be consistent with empyema after prior sampling. There is significant consolidation of  the left lung. Fluid return demonstrates mildly turbid yellowish fluid from the left pleural space. A 14 French pigtail catheter was placed and is draining well after placement. IMPRESSION: CT-guided left pleural drainage catheter placement to treat empyema. A 14 French pigtail  drainage catheter was placed. The catheter was connected to a [place] Pleur-Evac device and will be connected to wall suction at -20 cm of water. Electronically Signed   By: Aletta Edouard M.D.   On: 05/02/2015 16:44   US Thoracentesis Asp Pleural Space W/img Guide  04/30/2015  INDICATION: 82 year old male who was admitted through the emergency department with sepsis secondary to community acquired pneumonia. The patient was found have a large left-sided pleural effusion on chest x-ray. Request has been made for a therapeutic and diagnostic thoracentesis. EXAM: ULTRASOUND GUIDED DIAGNOSTIC AND THERAPEUTIC THORACENTESIS MEDICATIONS: 1% lidocaine COMPLICATIONS: None immediate. PROCEDURE: An ultrasound guided thoracentesis was thoroughly discussed with the patient and questions answered. The benefits, risks, alternatives and complications were also discussed. The patient understands and wishes to proceed with the procedure. Written consent was obtained. Ultrasound was performed to localize and mark an adequate pocket of fluid in the left chest. The area was then prepped and draped in the normal sterile fashion. 1% Lidocaine was used for local anesthesia. Under ultrasound guidance a Safe-T-Centesis catheter was introduced. Thoracentesis was performed. The procedure was stopped after 1-1/2 L of fluid was obtained. This is standard procedure for a first thoracentesis and the patient had an elevated creatinine as well at 1.56. The catheter was removed and a dressing applied. FINDINGS: A total of approximately 1.5 L of cloudy amber fluid was removed. Multiple loculations were noted on ultrasound. After fluid was removed, the final image revealed that a significant portion of the effusion was removed with this procedure. Samples were sent to the laboratory as requested by the clinical team. IMPRESSION: Successful ultrasound guided left thoracentesis yielding 1.5 L of pleural fluid. Read by: Saverio Danker, PA-C  Electronically Signed   By: Jacqulynn Cadet M.D.   On: 04/30/2015 12:46    Labs:  CBC:  Recent Labs  04/29/15 1429 04/30/15 0441 05/02/15 0442 05/04/15 0454  WBC 30.7* 31.5* 22.7* 16.0*  HGB 11.1* 9.9* 9.3* 10.2*  HCT 32.1* 27.8* 27.0* 29.9*  PLT 457* 400 407* 467*    COAGS:  Recent Labs  04/29/15 1818 05/02/15 1313  INR 1.72* 1.59*  APTT 46* 41*    BMP:  Recent Labs  04/29/15 1429 04/30/15 0441 05/02/15 0442 05/04/15 0454  NA 134* 135 137 137  K 4.2 4.8 4.0 4.0  CL 101 112* 108 107  CO2 22 19* 21* 22  GLUCOSE 205* 146* 153* 139*  BUN 34* 30* 26* 20  CALCIUM 8.6* 8.4* 8.0* 7.8*  CREATININE 1.91* 1.52* 1.20 1.04  GFRNONAA 30* 40* 53* >60  GFRAA 35* 46* >60 >60    LIVER FUNCTION TESTS:  Recent Labs  04/29/15 1429  BILITOT 1.2  AST 25  ALT 24  ALKPHOS 92  PROT 7.4  ALBUMIN 2.9*    Assessment and Plan: 1. Left empyema, s/p drain placement on 4/12, [place] -cont chest tube for now -WBC trending down to 16 today -follow pulmonary's recommendations -will follow  Electronically Signed: Vyncent Overby E 05/04/2015, 8:58 AM   I spent a total of 15 Minutes at the the patient's bedside AND on the patient's hospital floor or unit, greater than 50% of which was counseling/coordinating care for left empyema

## 2015-05-04 NOTE — Progress Notes (Signed)
PT Cancellation Note  Patient Details Name: SHABAZZ WEIMAN MRN: [id number] DOB: 1926-06-11   Cancelled Treatment:     pt declined X 2 attempts.  Am too fatigued after having a BM but was OOB in recliner.  PM too tired "just got back to bed".                                            Pt plans to D/C back home and also declines HH PT.     Nathanial Rancher 05/04/2015, 3:42 PM

## 2015-05-04 NOTE — Progress Notes (Signed)
CRITICAL VALUE ALERT  Critical value received:  Chest Xray  Date of notification:  05/04/15  Time of notification:  0719  Critical value read back:Yes.    Nurse who received alert:  Ellen Henri  MD notified (1st page):  Dr Karolee Stamps  Time of first page:  0720 MD notified (2nd page):  Time of second page:  Responding MD:  Dr Karolee Stamps  Time MD responded:  [phone]

## 2015-05-05 ENCOUNTER — Inpatient Hospital Stay (HOSPITAL_COMMUNITY): Payer: PPO

## 2015-05-05 DIAGNOSIS — J90 Pleural effusion, not elsewhere classified: Secondary | ICD-10-CM | POA: Diagnosis not present

## 2015-05-05 DIAGNOSIS — I10 Essential (primary) hypertension: Secondary | ICD-10-CM | POA: Diagnosis not present

## 2015-05-05 DIAGNOSIS — Z720 Tobacco use: Secondary | ICD-10-CM

## 2015-05-05 DIAGNOSIS — A419 Sepsis, unspecified organism: Principal | ICD-10-CM

## 2015-05-05 DIAGNOSIS — I359 Nonrheumatic aortic valve disorder, unspecified: Secondary | ICD-10-CM | POA: Diagnosis not present

## 2015-05-05 DIAGNOSIS — N179 Acute kidney failure, unspecified: Secondary | ICD-10-CM

## 2015-05-05 DIAGNOSIS — J948 Other specified pleural conditions: Secondary | ICD-10-CM | POA: Diagnosis not present

## 2015-05-05 DIAGNOSIS — J189 Pneumonia, unspecified organism: Secondary | ICD-10-CM | POA: Diagnosis not present

## 2015-05-05 DIAGNOSIS — J869 Pyothorax without fistula: Secondary | ICD-10-CM | POA: Diagnosis not present

## 2015-05-05 LAB — CULTURE, BODY FLUID W GRAM STAIN -BOTTLE

## 2015-05-05 LAB — CBC
HEMATOCRIT: 29.7 % — AB (ref 39.0–52.0)
Hemoglobin: 10.3 g/dL — ABNORMAL LOW (ref 13.0–17.0)
MCH: 30.4 pg (ref 26.0–34.0)
MCHC: 34.7 g/dL (ref 30.0–36.0)
MCV: 87.6 fL (ref 78.0–100.0)
Platelets: 445 10*3/uL — ABNORMAL HIGH (ref 150–400)
RBC: 3.39 MIL/uL — ABNORMAL LOW (ref 4.22–5.81)
RDW: 13.3 % (ref 11.5–15.5)
WBC: 15.1 10*3/uL — AB (ref 4.0–10.5)

## 2015-05-05 LAB — CULTURE, BODY FLUID-BOTTLE: CULTURE: NO GROWTH

## 2015-05-05 MED ORDER — SODIUM CHLORIDE 0.9 % IJ SOLN
Freq: Once | INTRAMUSCULAR | Status: AC
  Administered 2015-05-05: 11:00:00 via INTRAPLEURAL
  Filled 2015-05-05: qty 10

## 2015-05-05 NOTE — Progress Notes (Signed)
Name: [name] MRN: [id number] DOB: 05/25/1926    ADMISSION DATE:  04/29/2015 CONSULTATION DATE:  05/02/15  REFERRING MD :  Dr. Olevia Bowens  CHIEF COMPLAINT:  PNA, Effusion    HISTORY OF PRESENT ILLNESS:  82 year old man with cold symptoms about 4 weeks ago and failure to thrive presented with large left pleural effusion Underwent 1.5 L thoracenteses-fluid results consistent with neutrophilic exudate, low glucose seems to favor empyema, Gram stain is negative     PMH of HTN, HLD, CAD s/p 2 vessel CABG, severe aortic stenosis s/p AVR (tissue), BPH, memory loss (followed by Dr. Jannifer Franklin, in past has not wanted medications), and diverticulosis     SUBJECTIVE:  Afebrile Denies pain, cough Minimal drainage via chest tube  VITAL SIGNS: Temp:  [97.6 F (36.4 C)-98.6 F (37 C)] 97.7 F (36.5 C) (04/15 0505) Pulse Rate:  [72-80] 80 (04/15 0505) Resp:  [18-20] 20 (04/15 0505) BP: (117-144)/(51-61) 144/61 mmHg (04/15 0505) SpO2:  [93 %-98 %] 93 % (04/15 0505)  PHYSICAL EXAMINATION: General:  Elderly male in NAD Neuro:  Oriented, MAE, generalized weakness, calm HEENT:  MM pink/moist, no jvd Cardiovascular:  s1s2 rrr, no m/r/g  Lungs:  Even/non-labored, lungs  diminished on left Abdomen:  Obese/soft, bsx4 active  Musculoskeletal:  No acute deformities  Skin:  Warm/dry, no edema    Recent Labs Lab 04/30/15 0441 05/02/15 0442 05/04/15 0454  NA 135 137 137  K 4.8 4.0 4.0  CL 112* 108 107  CO2 19* 21* 22  BUN 30* 26* 20  CREATININE 1.52* 1.20 1.04  GLUCOSE 146* 153* 139*    Recent Labs Lab 05/02/15 0442 05/04/15 0454 05/05/15 0448  HGB 9.3* 10.2* 10.3*  HCT 27.0* 29.9* 29.7*  WBC 22.7* 16.0* 15.1*  PLT 407* 467* 445*   Dg Chest Port 1 View  05/05/2015  CLINICAL DATA:  82 year old male with history of left-sided empyema. History of severe aortic stenosis status post aortic valve replacement. EXAM: PORTABLE CHEST 1 VIEW COMPARISON:  Chest x-ray 05/04/2015.  FINDINGS: Left-sided pigtail drainage catheter remains in the left hemithorax, with the tip projecting over the mid left hemithorax. There continues to be left pleural fluid with irregular contours, indicative of a loculated pleural effusion. Opacities throughout the left mid to lower lung likely reflect associated areas of passive atelectasis and/or airspace consolidation. Right lung is clear. No right pleural effusion. No evidence of pulmonary edema. Heart size appears mildly enlarged (unchanged). Upper mediastinal contours are within normal limits. Atherosclerosis in the thoracic aorta. Status post median sternotomy for CABG and aortic valve replacement (a stented bio prosthesis is noted). IMPRESSION: 1. Support apparatus and postoperative changes, as above. 2. Persistent moderate loculated left pleural fluid collection compatible with the reported history of empyema. This is associated with extensive areas of atelectasis and/or airspace consolidation throughout the left mid to lower lung. The overall appearance the chest is very similar to the prior examination from 05/04/2015. Electronically Signed   By: Vinnie Langton M.D.   On: 05/05/2015 09:27   Dg Chest Port 1 View  05/04/2015  CLINICAL DATA:  Chest tube. EXAM: PORTABLE CHEST 1 VIEW COMPARISON:  05/03/2015.  CT 04/30/2015 . FINDINGS: Left chest tube in stable position. Pleural fluid collection on the left again noted. Slight decrease in size. Loculation may be present. Left lower lobe atelectasis and or infiltrate . No prominent pneumothorax. Mild pleural air collection along the left base of the along the chest tube courses cannot be entirely excluded . Right  lung is clear. Prior CABG and cardiac valve replacement. Stable cardiomegaly. No pulmonary venous congestion. IMPRESSION: 1. Left chest tube in stable position. Left pleural fluid collection has decreased in size. A component of loculation may be present. Left lower lobe atelectasis and/or  infiltrate. Small amount of pleural air along the left base along the course of the left chest tube cannot be excluded. Continued follow-up exam suggested. 2.  Prior CABG.  Cardiac valve replacement.  Stable cardiomegaly. Critical Value/emergent results were called by telephone at the time of interpretation on 05/04/2015 at 7:19 am to nurse Lenell Antu , who verbally acknowledged these results. Electronically Signed   By: Marcello Moores  Register   On: 05/04/2015 07:22     SIGNIFICANT EVENTS  4/09  Admit  4/10  L Thora with 1.5L removed, CT post shows residual large effusion, concern for loculation / empyema 4/12 lt chest tube by IR  4/13 tpa via chest tube 4/14 drained 800 cc  STUDIES:  Pleural Fluid 4/10   Glucose <20  Protein 4.5   LD 300 (exudative by Lights)  WBC 12,483 with 90% neutrophils  GS >> abundant wbc  Culture >>   Cytology >> marked acute inflammation, no malignant cells   CT Chest w/o 4/10 >> post thora, large left effusion with significant LLL, LUL atelectasis, minimal R effusion, no edema   CULTURES:  BCx2 4/9 >> 1/2 with coag neg staph  ANTIBIOTICS:  Azithromycin 4/9 >> Rocephin 4/9 >>   ASSESSMENT / PLAN:   Large Left Pleural Effusion - concern for component of loculation / empyema s/p IR chest tube s/p tpa  CAP with Empyema    Plan: Continue abx as outlined > rocephin / azithro  tpa worked on D#1, repeated instillation of 10 mg today, keep clamped x 1 hr He is a poor candidate for VATS Monitor serial CXR, will consider rpt tpa O2 as needed for sats > 92% Chest tube to water seal    Delirium - in setting of underlying memory issues + infection      Kara Mead MD. Shade Flood. [facility] Pager [phone] If no response call [phone]    05/05/2015, 11:48 AM

## 2015-05-05 NOTE — Progress Notes (Signed)
PROGRESS NOTE  [name] [id number] DOB: 02/14/1926 DOA: 04/29/2015 PCP: Irven Shelling, MD Brief History 82 year old male with a history of hypertension, hyperlipidemia, aortic stenosis status post bioprosthetic valve presented with shortness of breath, coughing, and generalized weakness. Initial presentation revealed WBC 30.7 lactic acid 3.47, and chest x-ray showing large left pleural effusion with left lower lobe atelectasis/infiltrate. The patient was started on ceftriaxone and azithromycin.CT of the chest on 04/30/2015 revealed a large left pleural effusion with left lower lobe opacity. The patient underwent thoracocentesis on 04/30/2015 draining 1.5 L and which revealed WBC 12,482 suggesting empyema. Pulmonology was consulted.  He was felt to be a poor candidate for VATS. Therefore, interventional radiology was  Consulted.n 05/02/2015,IR placed a drainage catheter in the left chest. Assessment/Plan: sepsis due to community-acquired pneumonia: -Cont empiric antibiotics azithromycin and Rocephin started on 4.9.2017 -sepsis physiology resolved -blood culture suggests contaminant -he is afebrile and hemodynamically stable  Large left empyema: 04/30/2015--IRwas consulted for thoracocentesis that showed exudate, with a blood glucose less than 20--culture and cytology neg -04/30/15-CT scan showed loculation, PCCM was consulted and Recommedned Chest tube placement performed on 05/02/2015 with intermittent suction. tPA was given 4/12 -case discussed with Dr. Joya San tPA instillation 4/15  Acute on chronic kidney disease stage III: -secondary to sepsis environment depletion -Baseline creatinine 0.9-1.1 -improved with IV fluids  Generalized weakness: Physical therapy evaluated the patient at that recommended PT at home.  Normocytic anemia: Hemoglobin stable.  Essential hypertension: Amlodipine restarted--acceptable BP  Tobacco abuse: Counseling was  done.  History of aortic stenosis: -s/p bioprosthetic valve  Coronary artery disease -No chest pain presently -Continue aspirin and statin   Family Communication:   No family at beside Disposition Plan:   Not stable for d/c     Procedures/Studies: Dg Chest 1 View  04/30/2015  CLINICAL DATA:  Left thoracentesis EXAM: CHEST 1 VIEW COMPARISON:  04/29/2015 FINDINGS: Moderately large left effusion, decreased in size from yesterday. No pneumothorax. Left lower lobe collapse Right lung clear IMPRESSION: Moderately large left effusion remains following thoracentesis. No pneumothorax. Electronically Signed   By: Franchot Gallo M.D.   On: 04/30/2015 12:48   Dg Chest 2 View  04/29/2015  CLINICAL DATA:  82 year old with recent onset of cough and delays. EXAM: CHEST  2 VIEW COMPARISON:  11/05/2013 and earlier. FINDINGS: Prior sternotomy for aortic valve replacement and CABG. Cardiac silhouette likely normal in size, partially obscured by a large left pleural effusion. Associated dense consolidation in the left lower lobe and left upper lobe. Right lung clear. Pulmonary vascularity normal. No right pleural effusion. Degenerative changes and DISH involving the thoracic spine. IMPRESSION: Large left pleural effusion and associated dense passive atelectasis and/or pneumonia in the left lower lobe and left upper lobe. Electronically Signed   By: Evangeline Dakin M.D.   On: 04/29/2015 14:53   Ct Chest Wo Contrast  04/30/2015  CLINICAL DATA:  Shortness of breath and cough with generalized weakness beginning several weeks ago. Left pleural effusion on current chest radiograph. EXAM: CT CHEST WITHOUT CONTRAST TECHNIQUE: Multidetector CT imaging of the chest was performed following the standard protocol without IV contrast. COMPARISON:  Chest radiograph, 04/30/2015. FINDINGS: Neck base and axilla:  No mass or adenopathy. Mediastinum and hila: Heart is normal in size. There changes from cardiac surgery and aortic  valve replacement. Coronary artery and aortic atherosclerotic calcifications are noted. No mediastinal or hilar masses or enlarged lymph nodes. Lungs and pleura: Large left  pleural effusion. There is significant atelectasis of the left lower lobe. There is atelectasis of a portion of the left upper lobe lingula with some minor dependent subsegmental atelectasis in the posterior left upper lobe. A component of left lung base pneumonia is possible. Minimal right pleural effusion with mild dependent right lower lobe subsegmental atelectasis. No convincing pneumonia and no pulmonary edema. No pneumothorax. Limited upper abdomen:  No acute findings. Musculoskeletal: Degenerative changes throughout the visualized spine. Bones demineralized. No osteoblastic or osteolytic lesions. IMPRESSION: 1. Large left pleural effusion with significant left lower lobe and left upper lobe lingula atelectasis. Consider pneumonia as a component of the parenchymal opacity given the patient's history. 2. Minimal right pleural effusion with associated dependent right lower lobe subsegmental atelectasis. 3. No pulmonary edema. Electronically Signed   By: Lajean Manes M.D.   On: 04/30/2015 15:28   Dg Chest Port 1 View  05/05/2015  CLINICAL DATA:  82 year old male with history of left-sided empyema. History of severe aortic stenosis status post aortic valve replacement. EXAM: PORTABLE CHEST 1 VIEW COMPARISON:  Chest x-ray 05/04/2015. FINDINGS: Left-sided pigtail drainage catheter remains in the left hemithorax, with the tip projecting over the mid left hemithorax. There continues to be left pleural fluid with irregular contours, indicative of a loculated pleural effusion. Opacities throughout the left mid to lower lung likely reflect associated areas of passive atelectasis and/or airspace consolidation. Right lung is clear. No right pleural effusion. No evidence of pulmonary edema. Heart size appears mildly enlarged (unchanged). Upper  mediastinal contours are within normal limits. Atherosclerosis in the thoracic aorta. Status post median sternotomy for CABG and aortic valve replacement (a stented bio prosthesis is noted). IMPRESSION: 1. Support apparatus and postoperative changes, as above. 2. Persistent moderate loculated left pleural fluid collection compatible with the reported history of empyema. This is associated with extensive areas of atelectasis and/or airspace consolidation throughout the left mid to lower lung. The overall appearance the chest is very similar to the prior examination from 05/04/2015. Electronically Signed   By: Vinnie Langton M.D.   On: 05/05/2015 09:27   Dg Chest Port 1 View  05/04/2015  CLINICAL DATA:  Chest tube. EXAM: PORTABLE CHEST 1 VIEW COMPARISON:  05/03/2015.  CT 04/30/2015 . FINDINGS: Left chest tube in stable position. Pleural fluid collection on the left again noted. Slight decrease in size. Loculation may be present. Left lower lobe atelectasis and or infiltrate . No prominent pneumothorax. Mild pleural air collection along the left base of the along the chest tube courses cannot be entirely excluded . Right lung is clear. Prior CABG and cardiac valve replacement. Stable cardiomegaly. No pulmonary venous congestion. IMPRESSION: 1. Left chest tube in stable position. Left pleural fluid collection has decreased in size. A component of loculation may be present. Left lower lobe atelectasis and/or infiltrate. Small amount of pleural air along the left base along the course of the left chest tube cannot be excluded. Continued follow-up exam suggested. 2.  Prior CABG.  Cardiac valve replacement.  Stable cardiomegaly. Critical Value/emergent results were called by telephone at the time of interpretation on 05/04/2015 at 7:19 am to nurse Lenell Antu , who verbally acknowledged these results. Electronically Signed   By: Marcello Moores  Register   On: 05/04/2015 07:22   Dg Chest Port 1 View  05/03/2015  CLINICAL DATA:   Cough, left effusion EXAM: PORTABLE CHEST 1 VIEW COMPARISON:  CT chest of 04/30/2015, and chest x-ray of 04/30/2015 FINDINGS: The left chest tube is present and there  has been a slight decrease in volume of the left pleural effusion with probable left basilar atelectasis. The right lung is clear. Cardiomegaly is stable and median sternotomy sutures are noted with aortic valve replacement present. IMPRESSION: 1. Left chest tube present with slight decrease in volume of the left pleural effusion. 2. Stable cardiomegaly. Electronically Signed   By: Ivar Drape M.D.   On: 05/03/2015 08:13   Ct Image Guided Drainage By Percutaneous Catheter  05/02/2015  CLINICAL DATA:  Left lung pneumonia with large left pleural empyema. Prior thoracentesis on 04/30/2015 demonstrated fluid consistent with empyema. The patient is not a candidate currently for surgical intervention with VATS. Request has been made to place a percutaneous drainage catheter into the left empyema. EXAM: CT GUIDED DRAINAGE OF LEFT PLEURAL EMPYEMA ANESTHESIA/SEDATION: 0.5 Mg IV Versed 25 mcg IV Fentanyl Total Moderate Sedation Time:  20 minutes. The patient's level of consciousness and physiologic status were continuously monitored during the procedure by Radiology nursing. PROCEDURE: The procedure, risks, benefits, and alternatives were explained to the patient. Questions regarding the procedure were encouraged and answered. The patient understands and consents to the procedure. A time-out was performed prior to initiating the procedure. The left chest wall was prepped with chlorhexidine in a sterile fashion, and a sterile drape was applied covering the operative field. A sterile gown and sterile gloves were used for the procedure. Local anesthesia was provided with 1% Lidocaine. CT was performed in a supine position. After localizing the left pleural collection, an 18 gauge trocar needle was advanced into the left pleural space from an anterior approach.  After return of fluid, a guidewire was advanced through the needle. The tract was dilated over the wire and a 14 French pigtail drainage catheter advanced. Catheter positioning was confirmed by CT. The catheter was connected to a [place] Pleur-Evac device. It was secured at the skin with a Prolene retention suture, StatLock device and overlying dressing. COMPLICATIONS: None FINDINGS: Initial CT shows significant reaccumulation of a large left pleural effusion which is loculated and has been demonstrated to be consistent with empyema after prior sampling. There is significant consolidation of the left lung. Fluid return demonstrates mildly turbid yellowish fluid from the left pleural space. A 14 French pigtail catheter was placed and is draining well after placement. IMPRESSION: CT-guided left pleural drainage catheter placement to treat empyema. A 14 French pigtail drainage catheter was placed. The catheter was connected to a [place] Pleur-Evac device and will be connected to wall suction at -20 cm of water. Electronically Signed   By: Aletta Edouard M.D.   On: 05/02/2015 16:44   US Thoracentesis Asp Pleural Space W/img Guide  04/30/2015  INDICATION: 82 year old male who was admitted through the emergency department with sepsis secondary to community acquired pneumonia. The patient was found have a large left-sided pleural effusion on chest x-ray. Request has been made for a therapeutic and diagnostic thoracentesis. EXAM: ULTRASOUND GUIDED DIAGNOSTIC AND THERAPEUTIC THORACENTESIS MEDICATIONS: 1% lidocaine COMPLICATIONS: None immediate. PROCEDURE: An ultrasound guided thoracentesis was thoroughly discussed with the patient and questions answered. The benefits, risks, alternatives and complications were also discussed. The patient understands and wishes to proceed with the procedure. Written consent was obtained. Ultrasound was performed to localize and mark an adequate pocket of fluid in the left chest. The area  was then prepped and draped in the normal sterile fashion. 1% Lidocaine was used for local anesthesia. Under ultrasound guidance a Safe-T-Centesis catheter was introduced. Thoracentesis was performed. The procedure was stopped  after 1-1/2 L of fluid was obtained. This is standard procedure for a first thoracentesis and the patient had an elevated creatinine as well at 1.56. The catheter was removed and a dressing applied. FINDINGS: A total of approximately 1.5 L of cloudy amber fluid was removed. Multiple loculations were noted on ultrasound. After fluid was removed, the final image revealed that a significant portion of the effusion was removed with this procedure. Samples were sent to the laboratory as requested by the clinical team. IMPRESSION: Successful ultrasound guided left thoracentesis yielding 1.5 L of pleural fluid. Read by: Saverio Danker, PA-C Electronically Signed   By: Jacqulynn Cadet M.D.   On: 04/30/2015 12:46         Subjective: Patient denies fevers, chills, headache, chest pain, dyspnea, nausea, vomiting, diarrhea, abdominal pain, dysuria, hematuria   Objective: Filed Vitals:   05/04/15 0939 05/04/15 1400 05/04/15 2051 05/05/15 0505  BP: 139/58 117/57 125/51 144/61  Pulse:  80 72 80  Temp:  97.6 F (36.4 C) 98.6 F (37 C) 97.7 F (36.5 C)  TempSrc:  Oral Oral Oral  Resp:  20 18 20   Height:      Weight:      SpO2:  98% 95% 93%    Intake/Output Summary (Last 24 hours) at 05/05/15 1110 Last data filed at 05/05/15 0846  Gross per 24 hour  Intake    120 ml  Output    350 ml  Net   -230 ml   Weight change:  Exam:   General:  Pt is alert, follows commands appropriately, not in acute distress  HEENT: No icterus, No thrush, No neck mass, [place]/AT  Cardiovascular: RRR, S1/S2, no rubs, no gallops  Respiratory: Bibasilar rales. Diminished breath sounds left base.  Abdomen: Soft/+BS, non tender, non distended, no guarding  Extremities: No edema, No lymphangitis,  No petechiae, No rashes, no synovitis  Data Reviewed: Basic Metabolic Panel:  Recent Labs Lab 04/29/15 1429 04/30/15 0441 05/02/15 0442 05/04/15 0454  NA 134* 135 137 137  K 4.2 4.8 4.0 4.0  CL 101 112* 108 107  CO2 22 19* 21* 22  GLUCOSE 205* 146* 153* 139*  BUN 34* 30* 26* 20  CREATININE 1.91* 1.52* 1.20 1.04  CALCIUM 8.6* 8.4* 8.0* 7.8*   Liver Function Tests:  Recent Labs Lab 04/29/15 1429  AST 25  ALT 24  ALKPHOS 92  BILITOT 1.2  PROT 7.4  ALBUMIN 2.9*   No results for input(s): LIPASE, AMYLASE in the last 168 hours. No results for input(s): AMMONIA in the last 168 hours. CBC:  Recent Labs Lab 04/29/15 1429 04/30/15 0441 05/02/15 0442 05/04/15 0454 05/05/15 0448  WBC 30.7* 31.5* 22.7* 16.0* 15.1*  NEUTROABS 28.1*  --   --   --   --   HGB 11.1* 9.9* 9.3* 10.2* 10.3*  HCT 32.1* 27.8* 27.0* 29.9* 29.7*  MCV 87.5 86.9 86.8 87.4 87.6  PLT 457* 400 407* 467* 445*   Cardiac Enzymes: No results for input(s): CKTOTAL, CKMB, CKMBINDEX, TROPONINI in the last 168 hours. BNP: Invalid input(s): POCBNP CBG: No results for input(s): GLUCAP in the last 168 hours.  Recent Results (from the past 240 hour(s))  Blood culture (routine x 2)     Status: Abnormal   Collection Time: 04/29/15  4:00 PM  Result Value Ref Range Status   Specimen Description BLOOD RIGHT ANTECUBITAL  Final   Special Requests IN PEDIATRIC BOTTLE 5CC  Final   Culture  Setup Time   Final  GRAM POSITIVE COCCI IN CLUSTERS IN PEDIATRIC BOTTLE CRITICAL RESULT CALLED TO, READ BACK BY AND VERIFIED WITH: M. Fears RN 12:35 04/30/15  ([email or web address])    Culture (A)  Final    STAPHYLOCOCCUS SPECIES (COAGULASE NEGATIVE) THE SIGNIFICANCE OF ISOLATING THIS ORGANISM FROM A SINGLE SET OF BLOOD CULTURES WHEN MULTIPLE SETS ARE DRAWN IS UNCERTAIN. PLEASE NOTIFY THE MICROBIOLOGY DEPARTMENT WITHIN ONE WEEK IF SPECIATION AND SENSITIVITIES ARE REQUIRED. Performed at [facility]    Report Status 05/01/2015  FINAL  Final  Blood culture (routine x 2)     Status: None   Collection Time: 04/29/15  4:10 PM  Result Value Ref Range Status   Specimen Description BLOOD RIGHT HAND  Final   Special Requests BOTTLES DRAWN AEROBIC AND ANAEROBIC 5CC  Final   Culture   Final    NO GROWTH 5 DAYS Performed at [facility]    Report Status 05/04/2015 FINAL  Final  Culture, body fluid-bottle     Status: None (Preliminary result)   Collection Time: 04/30/15 12:32 PM  Result Value Ref Range Status   Specimen Description FLUID LEFT PLEURAL  Final   Special Requests BOTTLES DRAWN AEROBIC AND ANAEROBIC 10CC  Final   Culture   Final    NO GROWTH 4 DAYS Performed at [facility]    Report Status PENDING  Incomplete  Gram stain     Status: None   Collection Time: 04/30/15 12:32 PM  Result Value Ref Range Status   Specimen Description FLUID LEFT PLEURAL  Final   Special Requests NONE Performed at [facility]   Final   Gram Stain   Final    ABUNDANT WBC PRESENT,BOTH PMN AND MONONUCLEAR NO ORGANISMS SEEN    Report Status 04/30/2015 FINAL  Final     Scheduled Meds: . alteplase (tPA) 10mg  in NS 80mL for Dr.Oaks (intrapleural administration/[facility])   Intrapleural Once  . amLODipine  10 mg Oral Daily  . aspirin EC  81 mg Oral Daily  . azithromycin  500 mg Intravenous Q24H  . cefTRIAXone (ROCEPHIN)  IV  1 g Intravenous Q24H  . dextromethorphan-guaiFENesin  2 tablet Oral BID  . feeding supplement (ENSURE ENLIVE)  237 mL Oral BID BM  . multivitamin with minerals  1 tablet Oral Daily  . pravastatin  20 mg Oral q1800   Continuous Infusions: . sodium chloride 10 mL/hr at 05/01/15 0754     Magon Croson, DO  [facility] Pager [phone]  If 7PM-7AM, please contact night-coverage [email or web address] Password [email or web address] 05/05/2015, 11:10 AM   LOS: 6 days

## 2015-05-05 NOTE — Progress Notes (Signed)
Referring Physician(s): Dr Georgia Duff  Supervising Physician: Corrie Mckusick  Chief Complaint:  Left empyema Left chest tube placed 05/02/15  Subjective:  Feeling better daily Chest tube in place Output minimal x 24 hrs  Allergies: Review of patient's allergies indicates no known allergies.  Medications: Prior to Admission medications   Medication Sig Start Date End Date Taking? Authorizing Provider  amLODipine (NORVASC) 10 MG tablet Take 1 tablet (10 mg total) by mouth daily. 03/08/15  Yes Jettie Booze, MD  amoxicillin (AMOXIL) 500 MG capsule Take 500 mg by mouth 3 (three) times daily as needed (dental procedure). PRIOR TO DENTAL PROCEDURE.   Yes Historical Provider, MD  aspirin EC 81 MG tablet Take 81 mg by mouth daily.   Yes Historical Provider, MD  Multiple Vitamins-Minerals (MULTIVITAMIN WITH MINERALS) tablet Take 1 tablet by mouth daily.   Yes Historical Provider, MD  pravastatin (PRAVACHOL) 20 MG tablet TAKE 1 TABLET BY MOUTH EVERY DAY 03/08/15  Yes Jettie Booze, MD  lisinopril (PRINIVIL,ZESTRIL) 20 MG tablet Take 2 tablets (40 mg total) by mouth daily. Patient not taking: Reported on 04/25/2015 03/08/15   Jettie Booze, MD     Vital Signs: BP 144/61 mmHg  Pulse 80  Temp(Src) 97.7 F (36.5 C) (Oral)  Resp 20  Ht 5\' 6"  (1.676 m)  Wt 161 lb 6 oz (73.2 kg)  BMI 26.06 kg/m2  SpO2 93%  Physical Exam  Pulmonary/Chest: Effort normal. He has wheezes.  Skin: Skin is warm and dry.  Skin site of L chest tube is clean and dry  700 cc in Pleurvac 700 cc recorded output yesterday ( RN tells me this recording must be error; very little out in 24-48 hrs) No air leak  Nursing note and vitals reviewed.  CXR: as below  Imaging: Dg Chest Port 1 View  05/05/2015  CLINICAL DATA:  82 year old male with history of left-sided empyema. History of severe aortic stenosis status post aortic valve replacement. EXAM: PORTABLE CHEST 1 VIEW COMPARISON:  Chest  x-ray 05/04/2015. FINDINGS: Left-sided pigtail drainage catheter remains in the left hemithorax, with the tip projecting over the mid left hemithorax. There continues to be left pleural fluid with irregular contours, indicative of a loculated pleural effusion. Opacities throughout the left mid to lower lung likely reflect associated areas of passive atelectasis and/or airspace consolidation. Right lung is clear. No right pleural effusion. No evidence of pulmonary edema. Heart size appears mildly enlarged (unchanged). Upper mediastinal contours are within normal limits. Atherosclerosis in the thoracic aorta. Status post median sternotomy for CABG and aortic valve replacement (a stented bio prosthesis is noted). IMPRESSION: 1. Support apparatus and postoperative changes, as above. 2. Persistent moderate loculated left pleural fluid collection compatible with the reported history of empyema. This is associated with extensive areas of atelectasis and/or airspace consolidation throughout the left mid to lower lung. The overall appearance the chest is very similar to the prior examination from 05/04/2015. Electronically Signed   By: Vinnie Langton M.D.   On: 05/05/2015 09:27   Dg Chest Port 1 View  05/04/2015  CLINICAL DATA:  Chest tube. EXAM: PORTABLE CHEST 1 VIEW COMPARISON:  05/03/2015.  CT 04/30/2015 . FINDINGS: Left chest tube in stable position. Pleural fluid collection on the left again noted. Slight decrease in size. Loculation may be present. Left lower lobe atelectasis and or infiltrate . No prominent pneumothorax. Mild pleural air collection along the left base of the along the chest tube courses cannot be entirely  excluded . Right lung is clear. Prior CABG and cardiac valve replacement. Stable cardiomegaly. No pulmonary venous congestion. IMPRESSION: 1. Left chest tube in stable position. Left pleural fluid collection has decreased in size. A component of loculation may be present. Left lower lobe  atelectasis and/or infiltrate. Small amount of pleural air along the left base along the course of the left chest tube cannot be excluded. Continued follow-up exam suggested. 2.  Prior CABG.  Cardiac valve replacement.  Stable cardiomegaly. Critical Value/emergent results were called by telephone at the time of interpretation on 05/04/2015 at 7:19 am to nurse Lenell Antu , who verbally acknowledged these results. Electronically Signed   By: Marcello Moores  Register   On: 05/04/2015 07:22   Dg Chest Port 1 View  05/03/2015  CLINICAL DATA:  Cough, left effusion EXAM: PORTABLE CHEST 1 VIEW COMPARISON:  CT chest of 04/30/2015, and chest x-ray of 04/30/2015 FINDINGS: The left chest tube is present and there has been a slight decrease in volume of the left pleural effusion with probable left basilar atelectasis. The right lung is clear. Cardiomegaly is stable and median sternotomy sutures are noted with aortic valve replacement present. IMPRESSION: 1. Left chest tube present with slight decrease in volume of the left pleural effusion. 2. Stable cardiomegaly. Electronically Signed   By: Ivar Drape M.D.   On: 05/03/2015 08:13   Ct Image Guided Drainage By Percutaneous Catheter  05/02/2015  CLINICAL DATA:  Left lung pneumonia with large left pleural empyema. Prior thoracentesis on 04/30/2015 demonstrated fluid consistent with empyema. The patient is not a candidate currently for surgical intervention with VATS. Request has been made to place a percutaneous drainage catheter into the left empyema. EXAM: CT GUIDED DRAINAGE OF LEFT PLEURAL EMPYEMA ANESTHESIA/SEDATION: 0.5 Mg IV Versed 25 mcg IV Fentanyl Total Moderate Sedation Time:  20 minutes. The patient's level of consciousness and physiologic status were continuously monitored during the procedure by Radiology nursing. PROCEDURE: The procedure, risks, benefits, and alternatives were explained to the patient. Questions regarding the procedure were encouraged and answered. The  patient understands and consents to the procedure. A time-out was performed prior to initiating the procedure. The left chest wall was prepped with chlorhexidine in a sterile fashion, and a sterile drape was applied covering the operative field. A sterile gown and sterile gloves were used for the procedure. Local anesthesia was provided with 1% Lidocaine. CT was performed in a supine position. After localizing the left pleural collection, an 18 gauge trocar needle was advanced into the left pleural space from an anterior approach. After return of fluid, a guidewire was advanced through the needle. The tract was dilated over the wire and a 14 French pigtail drainage catheter advanced. Catheter positioning was confirmed by CT. The catheter was connected to a [place] Pleur-Evac device. It was secured at the skin with a Prolene retention suture, StatLock device and overlying dressing. COMPLICATIONS: None FINDINGS: Initial CT shows significant reaccumulation of a large left pleural effusion which is loculated and has been demonstrated to be consistent with empyema after prior sampling. There is significant consolidation of the left lung. Fluid return demonstrates mildly turbid yellowish fluid from the left pleural space. A 14 French pigtail catheter was placed and is draining well after placement. IMPRESSION: CT-guided left pleural drainage catheter placement to treat empyema. A 14 French pigtail drainage catheter was placed. The catheter was connected to a [place] Pleur-Evac device and will be connected to wall suction at -20 cm of water. Electronically Signed  By: Aletta Edouard M.D.   On: 05/02/2015 16:44    Labs:  CBC:  Recent Labs  04/30/15 0441 05/02/15 0442 05/04/15 0454 05/05/15 0448  WBC 31.5* 22.7* 16.0* 15.1*  HGB 9.9* 9.3* 10.2* 10.3*  HCT 27.8* 27.0* 29.9* 29.7*  PLT 400 407* 467* 445*    COAGS:  Recent Labs  04/29/15 1818 05/02/15 1313  INR 1.72* 1.59*  APTT 46* 41*     BMP:  Recent Labs  04/29/15 1429 04/30/15 0441 05/02/15 0442 05/04/15 0454  NA 134* 135 137 137  K 4.2 4.8 4.0 4.0  CL 101 112* 108 107  CO2 22 19* 21* 22  GLUCOSE 205* 146* 153* 139*  BUN 34* 30* 26* 20  CALCIUM 8.6* 8.4* 8.0* 7.8*  CREATININE 1.91* 1.52* 1.20 1.04  GFRNONAA 30* 40* 53* >60  GFRAA 35* 46* >60 >60    LIVER FUNCTION TESTS:  Recent Labs  04/29/15 1429  BILITOT 1.2  AST 25  ALT 24  ALKPHOS 92  PROT 7.4  ALBUMIN 2.9*    Assessment and Plan:  Left empyema chest tube drain in place Little output for last 1-2 days Dr Elsworth Soho giving intra catheter tpa today Will recheck in am  Electronically Signed: Donnajean Chesnut A 05/05/2015, 11:07 AM   I spent a total of 15 Minutes at the the patient's bedside AND on the patient's hospital floor or unit, greater than 50% of which was counseling/coordinating care for L chest tube drain

## 2015-05-06 ENCOUNTER — Inpatient Hospital Stay (HOSPITAL_COMMUNITY): Payer: PPO

## 2015-05-06 DIAGNOSIS — Z720 Tobacco use: Secondary | ICD-10-CM | POA: Diagnosis not present

## 2015-05-06 DIAGNOSIS — I10 Essential (primary) hypertension: Secondary | ICD-10-CM | POA: Diagnosis not present

## 2015-05-06 DIAGNOSIS — J869 Pyothorax without fistula: Secondary | ICD-10-CM | POA: Diagnosis not present

## 2015-05-06 DIAGNOSIS — J948 Other specified pleural conditions: Secondary | ICD-10-CM | POA: Diagnosis not present

## 2015-05-06 DIAGNOSIS — N179 Acute kidney failure, unspecified: Secondary | ICD-10-CM | POA: Diagnosis not present

## 2015-05-06 DIAGNOSIS — J9600 Acute respiratory failure, unspecified whether with hypoxia or hypercapnia: Secondary | ICD-10-CM | POA: Diagnosis not present

## 2015-05-06 DIAGNOSIS — J189 Pneumonia, unspecified organism: Secondary | ICD-10-CM | POA: Diagnosis not present

## 2015-05-06 DIAGNOSIS — I359 Nonrheumatic aortic valve disorder, unspecified: Secondary | ICD-10-CM | POA: Diagnosis not present

## 2015-05-06 LAB — CBC
HCT: 30.9 % — ABNORMAL LOW (ref 39.0–52.0)
Hemoglobin: 10.8 g/dL — ABNORMAL LOW (ref 13.0–17.0)
MCH: 30.3 pg (ref 26.0–34.0)
MCHC: 35 g/dL (ref 30.0–36.0)
MCV: 86.6 fL (ref 78.0–100.0)
PLATELETS: 487 10*3/uL — AB (ref 150–400)
RBC: 3.57 MIL/uL — ABNORMAL LOW (ref 4.22–5.81)
RDW: 13.2 % (ref 11.5–15.5)
WBC: 16.6 10*3/uL — ABNORMAL HIGH (ref 4.0–10.5)

## 2015-05-06 NOTE — Progress Notes (Addendum)
Name: [name] MRN: [id number] DOB: [birth year]    ADMISSION DATE:  04/29/2015 CONSULTATION DATE:  05/02/15  REFERRING MD :  Dr. Olevia Bowens  CHIEF COMPLAINT:  PNA, Effusion    HISTORY OF PRESENT ILLNESS:  82 year old man with cold symptoms about 4 weeks ago and failure to thrive presented with large left pleural effusion Underwent 1.5 L thoracenteses-fluid results consistent with neutrophilic exudate, low glucose seems to favor empyema, Gram stain is negative     PMH of HTN, HLD, CAD s/p 2 vessel CABG, severe aortic stenosis s/p AVR (tissue), BPH, memory loss (followed by Dr. Jannifer Franklin, in past has not wanted medications), and diverticulosis   SIGNIFICANT EVENTS  4/09  Admit  4/10  L Thora with 1.5L removed, CT post shows residual large effusion, concern for loculation / empyema 4/12 lt chest tube by IR  4/13 tpa via chest tube 4/14 drained 800 cc 4/15 tpa reinserted  SUBJECTIVE:  Afebrile Denies pain, cough 200 cc drainage via chest tube  VITAL SIGNS: Temp:  [97.7 F (36.5 C)-98.6 F (37 C)] 98 F (36.7 C) (04/16 0454) Pulse Rate:  [68-81] 70 (04/16 0900) Resp:  [20-24] 24 (04/16 0900) BP: (110-136)/(50-62) 110/50 mmHg (04/16 0900) SpO2:  [96 %-98 %] 96 % (04/16 0900)  PHYSICAL EXAMINATION: General:  Elderly male in NAD Neuro:  Oriented, MAE, generalized weakness, calm HEENT:  MM pink/moist, no jvd Cardiovascular:  s1s2 rrr, no m/r/g  Lungs:  Even/non-labored, lungs  diminished on left, no air leak on chest tube Abdomen:  Obese/soft, bsx4 active  Musculoskeletal:  No acute deformities  Skin:  Warm/dry, no edema    Recent Labs Lab 04/30/15 0441 05/02/15 0442 05/04/15 0454  NA 135 137 137  K 4.8 4.0 4.0  CL 112* 108 107  CO2 19* 21* 22  BUN 30* 26* 20  CREATININE 1.52* 1.20 1.04  GLUCOSE 146* 153* 139*    Recent Labs Lab 05/04/15 0454 05/05/15 0448 05/06/15 0529  HGB 10.2* 10.3* 10.8*  HCT 29.9* 29.7* 30.9*  WBC 16.0* 15.1* 16.6*  PLT 467* 445*  487*   Dg Chest Port 1 View  05/06/2015  CLINICAL DATA:  Acute respiratory failure. Smoker. Prosthetic aortic valve. EXAM: PORTABLE CHEST 1 VIEW COMPARISON:  Yesterday. FINDINGS: Mildly decreased left basilar opacity and pleural fluid. A left basilar pigtail catheter remains in place. No pneumothorax is seen. The right lung is clear. The interstitial markings remain mildly prominent. No gross change in size of the heart with obscuration of the left heart borders. Median sternotomy wires and prosthetic aortic valve. Thoracic spine degenerative changes. IMPRESSION: 1. The mildly improved left basilar atelectasis and possible pneumonia. 2. Mildly decreased left pleural fluid. 3. Stable mild chronic interstitial lung disease. Electronically Signed   By: Claudie Revering M.D.   On: 05/06/2015 07:39   Dg Chest Port 1 View  05/05/2015  CLINICAL DATA:  82 year old male with history of left-sided empyema. History of severe aortic stenosis status post aortic valve replacement. EXAM: PORTABLE CHEST 1 VIEW COMPARISON:  Chest x-ray 05/04/2015. FINDINGS: Left-sided pigtail drainage catheter remains in the left hemithorax, with the tip projecting over the mid left hemithorax. There continues to be left pleural fluid with irregular contours, indicative of a loculated pleural effusion. Opacities throughout the left mid to lower lung likely reflect associated areas of passive atelectasis and/or airspace consolidation. Right lung is clear. No right pleural effusion. No evidence of pulmonary edema. Heart size appears mildly enlarged (unchanged). Upper mediastinal contours are within normal  limits. Atherosclerosis in the thoracic aorta. Status post median sternotomy for CABG and aortic valve replacement (a stented bio prosthesis is noted). IMPRESSION: 1. Support apparatus and postoperative changes, as above. 2. Persistent moderate loculated left pleural fluid collection compatible with the reported history of empyema. This is  associated with extensive areas of atelectasis and/or airspace consolidation throughout the left mid to lower lung. The overall appearance the chest is very similar to the prior examination from 05/04/2015. Electronically Signed   By: Vinnie Langton M.D.   On: 05/05/2015 09:27       STUDIES:  Pleural Fluid 4/10   Glucose <20  Protein 4.5   LD 300 (exudative by Lights)  WBC 12,483 with 90% neutrophils  GS >> abundant wbc  Culture >>   Cytology >> marked acute inflammation, no malignant cells   CT Chest w/o 4/10 >> post thora, large left effusion with significant LLL, LUL atelectasis, minimal R effusion, no edema   CULTURES:  BCx2 4/9 >> 1/2 with coag neg staph  ANTIBIOTICS:  Azithromycin 4/9 >>off Rocephin 4/9 >>   ASSESSMENT / PLAN:   Large Left Pleural Effusion - concern for component of loculation / empyema s/p IR chest tube s/p tpa  CAP with Empyema  -improved WBC   Plan: Continue abx as outlined > rocephin  tpa worked on D#1, repeat instillation 4/15 did not drain much He is a poor candidate for VATS Chest tube to water seal & can dc on 4/17 if no more drainage Will need prolonged abx 3-4 weeks  Delirium - in setting of underlying memory issues + infection -resolved     Kara Mead MD. Shade Flood. [facility] Pager [phone] If no response call [phone]    05/06/2015, 11:13 AM

## 2015-05-06 NOTE — Progress Notes (Addendum)
PROGRESS NOTE  [name] [id number] DOB: [birth year] DOA: 04/29/2015 PCP: Irven Shelling, MD  Brief History 82 year old male with a history of hypertension, hyperlipidemia, aortic stenosis status post bioprosthetic valve presented with shortness of breath, coughing, and generalized weakness. Initial presentation revealed WBC 30.7 lactic acid 3.47, and chest x-ray showing large left pleural effusion with left lower lobe atelectasis/infiltrate. The patient was started on ceftriaxone and azithromycin.CT of the chest on 04/30/2015 revealed a large left pleural effusion with left lower lobe opacity. The patient underwent thoracocentesis on 04/30/2015 draining 1.5 L and which revealed WBC 12,482 suggesting empyema. Pulmonology was consulted. He was felt to be a poor candidate for VATS. Therefore, interventional radiology was Consulted.n 05/02/2015,IR placed a drainage catheter in the left chest. Assessment/Plan: sepsis due to community-acquired pneumonia: -Cont empiric antibiotics azithromycin and Rocephin started on 4.9.2017 -sepsis physiology resolved -blood culture suggests contaminant -he is afebrile and hemodynamically stable  Large left empyema: 04/30/2015--IR was consulted for thoracocentesis that showed exudate, with a blood glucose less than 20--culture and cytology neg -04/30/15-CT scan showed loculation, PCCM was consulted and Recommended Chest tube placement performed on 05/02/2015 with intermittent suction. tPA was given 4/12 -case discussed with Dr. Joya San tPA instillation 4/15 -05/06/2015--discontinue azithromycin--finished 7 days; continue ceftriaxone -continue abx for now--plan to switch to po abx at time of d/c -recheck CBC as WBC shows slight increase  Acute on chronic kidney disease stage III: -secondary to sepsis environment depletion -Baseline creatinine 0.9-1.1 -improved with IV fluids  Generalized weakness: Physical therapy evaluated the  patient at that recommended PT at home.  Normocytic anemia: Hemoglobin stable.  Essential hypertension: Amlodipine restarted--acceptable BP  Tobacco abuse: Counseling was done.  History of aortic stenosis: -s/p bioprosthetic valve  Coronary artery disease -No chest pain presently -Continue aspirin and statin   Family Communication: No family at beside Disposition Plan: Home vs SNF 4/18--pt refusing PT    Procedures/Studies: Dg Chest 1 View  04/30/2015  CLINICAL DATA:  Left thoracentesis EXAM: CHEST 1 VIEW COMPARISON:  04/29/2015 FINDINGS: Moderately large left effusion, decreased in size from yesterday. No pneumothorax. Left lower lobe collapse Right lung clear IMPRESSION: Moderately large left effusion remains following thoracentesis. No pneumothorax. Electronically Signed   By: Franchot Gallo M.D.   On: 04/30/2015 12:48   Dg Chest 2 View  04/29/2015  CLINICAL DATA:  82 year old with recent onset of cough and delays. EXAM: CHEST  2 VIEW COMPARISON:  11/05/2013 and earlier. FINDINGS: Prior sternotomy for aortic valve replacement and CABG. Cardiac silhouette likely normal in size, partially obscured by a large left pleural effusion. Associated dense consolidation in the left lower lobe and left upper lobe. Right lung clear. Pulmonary vascularity normal. No right pleural effusion. Degenerative changes and DISH involving the thoracic spine. IMPRESSION: Large left pleural effusion and associated dense passive atelectasis and/or pneumonia in the left lower lobe and left upper lobe. Electronically Signed   By: Evangeline Dakin M.D.   On: 04/29/2015 14:53   Ct Chest Wo Contrast  04/30/2015  CLINICAL DATA:  Shortness of breath and cough with generalized weakness beginning several weeks ago. Left pleural effusion on current chest radiograph. EXAM: CT CHEST WITHOUT CONTRAST TECHNIQUE: Multidetector CT imaging of the chest was performed following the standard protocol without IV contrast.  COMPARISON:  Chest radiograph, 04/30/2015. FINDINGS: Neck base and axilla:  No mass or adenopathy. Mediastinum and hila: Heart is normal in size. There changes from cardiac surgery and aortic valve  replacement. Coronary artery and aortic atherosclerotic calcifications are noted. No mediastinal or hilar masses or enlarged lymph nodes. Lungs and pleura: Large left pleural effusion. There is significant atelectasis of the left lower lobe. There is atelectasis of a portion of the left upper lobe lingula with some minor dependent subsegmental atelectasis in the posterior left upper lobe. A component of left lung base pneumonia is possible. Minimal right pleural effusion with mild dependent right lower lobe subsegmental atelectasis. No convincing pneumonia and no pulmonary edema. No pneumothorax. Limited upper abdomen:  No acute findings. Musculoskeletal: Degenerative changes throughout the visualized spine. Bones demineralized. No osteoblastic or osteolytic lesions. IMPRESSION: 1. Large left pleural effusion with significant left lower lobe and left upper lobe lingula atelectasis. Consider pneumonia as a component of the parenchymal opacity given the patient's history. 2. Minimal right pleural effusion with associated dependent right lower lobe subsegmental atelectasis. 3. No pulmonary edema. Electronically Signed   By: Lajean Manes M.D.   On: 04/30/2015 15:28   Dg Chest Port 1 View  05/06/2015  CLINICAL DATA:  Acute respiratory failure. Smoker. Prosthetic aortic valve. EXAM: PORTABLE CHEST 1 VIEW COMPARISON:  Yesterday. FINDINGS: Mildly decreased left basilar opacity and pleural fluid. A left basilar pigtail catheter remains in place. No pneumothorax is seen. The right lung is clear. The interstitial markings remain mildly prominent. No gross change in size of the heart with obscuration of the left heart borders. Median sternotomy wires and prosthetic aortic valve. Thoracic spine degenerative changes. IMPRESSION: 1.  The mildly improved left basilar atelectasis and possible pneumonia. 2. Mildly decreased left pleural fluid. 3. Stable mild chronic interstitial lung disease. Electronically Signed   By: Claudie Revering M.D.   On: 05/06/2015 07:39   Dg Chest Port 1 View  05/05/2015  CLINICAL DATA:  82 year old male with history of left-sided empyema. History of severe aortic stenosis status post aortic valve replacement. EXAM: PORTABLE CHEST 1 VIEW COMPARISON:  Chest x-ray 05/04/2015. FINDINGS: Left-sided pigtail drainage catheter remains in the left hemithorax, with the tip projecting over the mid left hemithorax. There continues to be left pleural fluid with irregular contours, indicative of a loculated pleural effusion. Opacities throughout the left mid to lower lung likely reflect associated areas of passive atelectasis and/or airspace consolidation. Right lung is clear. No right pleural effusion. No evidence of pulmonary edema. Heart size appears mildly enlarged (unchanged). Upper mediastinal contours are within normal limits. Atherosclerosis in the thoracic aorta. Status post median sternotomy for CABG and aortic valve replacement (a stented bio prosthesis is noted). IMPRESSION: 1. Support apparatus and postoperative changes, as above. 2. Persistent moderate loculated left pleural fluid collection compatible with the reported history of empyema. This is associated with extensive areas of atelectasis and/or airspace consolidation throughout the left mid to lower lung. The overall appearance the chest is very similar to the prior examination from 05/04/2015. Electronically Signed   By: Vinnie Langton M.D.   On: 05/05/2015 09:27   Dg Chest Port 1 View  05/04/2015  CLINICAL DATA:  Chest tube. EXAM: PORTABLE CHEST 1 VIEW COMPARISON:  05/03/2015.  CT 04/30/2015 . FINDINGS: Left chest tube in stable position. Pleural fluid collection on the left again noted. Slight decrease in size. Loculation may be present. Left lower lobe  atelectasis and or infiltrate . No prominent pneumothorax. Mild pleural air collection along the left base of the along the chest tube courses cannot be entirely excluded . Right lung is clear. Prior CABG and cardiac valve replacement. Stable cardiomegaly. No  pulmonary venous congestion. IMPRESSION: 1. Left chest tube in stable position. Left pleural fluid collection has decreased in size. A component of loculation may be present. Left lower lobe atelectasis and/or infiltrate. Small amount of pleural air along the left base along the course of the left chest tube cannot be excluded. Continued follow-up exam suggested. 2.  Prior CABG.  Cardiac valve replacement.  Stable cardiomegaly. Critical Value/emergent results were called by telephone at the time of interpretation on 05/04/2015 at 7:19 am to nurse Lenell Antu , who verbally acknowledged these results. Electronically Signed   By: Marcello Moores  Register   On: 05/04/2015 07:22   Dg Chest Port 1 View  05/03/2015  CLINICAL DATA:  Cough, left effusion EXAM: PORTABLE CHEST 1 VIEW COMPARISON:  CT chest of 04/30/2015, and chest x-ray of 04/30/2015 FINDINGS: The left chest tube is present and there has been a slight decrease in volume of the left pleural effusion with probable left basilar atelectasis. The right lung is clear. Cardiomegaly is stable and median sternotomy sutures are noted with aortic valve replacement present. IMPRESSION: 1. Left chest tube present with slight decrease in volume of the left pleural effusion. 2. Stable cardiomegaly. Electronically Signed   By: Ivar Drape M.D.   On: 05/03/2015 08:13   Ct Image Guided Drainage By Percutaneous Catheter  05/02/2015  CLINICAL DATA:  Left lung pneumonia with large left pleural empyema. Prior thoracentesis on 04/30/2015 demonstrated fluid consistent with empyema. The patient is not a candidate currently for surgical intervention with VATS. Request has been made to place a percutaneous drainage catheter into the left  empyema. EXAM: CT GUIDED DRAINAGE OF LEFT PLEURAL EMPYEMA ANESTHESIA/SEDATION: 0.5 Mg IV Versed 25 mcg IV Fentanyl Total Moderate Sedation Time:  20 minutes. The patient's level of consciousness and physiologic status were continuously monitored during the procedure by Radiology nursing. PROCEDURE: The procedure, risks, benefits, and alternatives were explained to the patient. Questions regarding the procedure were encouraged and answered. The patient understands and consents to the procedure. A time-out was performed prior to initiating the procedure. The left chest wall was prepped with chlorhexidine in a sterile fashion, and a sterile drape was applied covering the operative field. A sterile gown and sterile gloves were used for the procedure. Local anesthesia was provided with 1% Lidocaine. CT was performed in a supine position. After localizing the left pleural collection, an 18 gauge trocar needle was advanced into the left pleural space from an anterior approach. After return of fluid, a guidewire was advanced through the needle. The tract was dilated over the wire and a 14 French pigtail drainage catheter advanced. Catheter positioning was confirmed by CT. The catheter was connected to a [place] Pleur-Evac device. It was secured at the skin with a Prolene retention suture, StatLock device and overlying dressing. COMPLICATIONS: None FINDINGS: Initial CT shows significant reaccumulation of a large left pleural effusion which is loculated and has been demonstrated to be consistent with empyema after prior sampling. There is significant consolidation of the left lung. Fluid return demonstrates mildly turbid yellowish fluid from the left pleural space. A 14 French pigtail catheter was placed and is draining well after placement. IMPRESSION: CT-guided left pleural drainage catheter placement to treat empyema. A 14 French pigtail drainage catheter was placed. The catheter was connected to a [place] Pleur-Evac device  and will be connected to wall suction at -20 cm of water. Electronically Signed   By: Aletta Edouard M.D.   On: 05/02/2015 16:44   US Thoracentesis Asp  Pleural Space W/img Guide  04/30/2015  INDICATION: 82 year old male who was admitted through the emergency department with sepsis secondary to community acquired pneumonia. The patient was found have a large left-sided pleural effusion on chest x-ray. Request has been made for a therapeutic and diagnostic thoracentesis. EXAM: ULTRASOUND GUIDED DIAGNOSTIC AND THERAPEUTIC THORACENTESIS MEDICATIONS: 1% lidocaine COMPLICATIONS: None immediate. PROCEDURE: An ultrasound guided thoracentesis was thoroughly discussed with the patient and questions answered. The benefits, risks, alternatives and complications were also discussed. The patient understands and wishes to proceed with the procedure. Written consent was obtained. Ultrasound was performed to localize and mark an adequate pocket of fluid in the left chest. The area was then prepped and draped in the normal sterile fashion. 1% Lidocaine was used for local anesthesia. Under ultrasound guidance a Safe-T-Centesis catheter was introduced. Thoracentesis was performed. The procedure was stopped after 1-1/2 L of fluid was obtained. This is standard procedure for a first thoracentesis and the patient had an elevated creatinine as well at 1.56. The catheter was removed and a dressing applied. FINDINGS: A total of approximately 1.5 L of cloudy amber fluid was removed. Multiple loculations were noted on ultrasound. After fluid was removed, the final image revealed that a significant portion of the effusion was removed with this procedure. Samples were sent to the laboratory as requested by the clinical team. IMPRESSION: Successful ultrasound guided left thoracentesis yielding 1.5 L of pleural fluid. Read by: Saverio Danker, PA-C Electronically Signed   By: Jacqulynn Cadet M.D.   On: 04/30/2015 12:46          Subjective: Patient denies fevers, chills, headache, chest pain, dyspnea, nausea, vomiting, diarrhea, abdominal pain, dysuria, hematuria   Objective: Filed Vitals:   05/05/15 2102 05/06/15 0454 05/06/15 0900 05/06/15 1342  BP: 127/53 123/62 110/50 112/51  Pulse: 70 68 70 67  Temp: 98.6 F (37 C) 98 F (36.7 C)  97.8 F (36.6 C)  TempSrc: Oral Oral  Oral  Resp: 20 24 24 20   Height:      Weight:      SpO2: 97% 96% 96% 96%    Intake/Output Summary (Last 24 hours) at 05/06/15 1721 Last data filed at 05/06/15 1700  Gross per 24 hour  Intake 934.83 ml  Output    270 ml  Net 664.83 ml   Weight change:  Exam:   General:  Pt is alert, follows commands appropriately, not in acute distress  HEENT: No icterus, No thrush, No neck mass, [place]/AT  Cardiovascular: RRR, S1/S2, no rubs, no gallops  Respiratory: Right clear to auscultation; left rhonchi  Abdomen: Soft/+BS, non tender, non distended, no guarding  Extremities: No edema, No lymphangitis, No petechiae, No rashes, no synovitis  Data Reviewed: Basic Metabolic Panel:  Recent Labs Lab 04/30/15 0441 05/02/15 0442 05/04/15 0454  NA 135 137 137  K 4.8 4.0 4.0  CL 112* 108 107  CO2 19* 21* 22  GLUCOSE 146* 153* 139*  BUN 30* 26* 20  CREATININE 1.52* 1.20 1.04  CALCIUM 8.4* 8.0* 7.8*   Liver Function Tests: No results for input(s): AST, ALT, ALKPHOS, BILITOT, PROT, ALBUMIN in the last 168 hours. No results for input(s): LIPASE, AMYLASE in the last 168 hours. No results for input(s): AMMONIA in the last 168 hours. CBC:  Recent Labs Lab 04/30/15 0441 05/02/15 0442 05/04/15 0454 05/05/15 0448 05/06/15 0529  WBC 31.5* 22.7* 16.0* 15.1* 16.6*  HGB 9.9* 9.3* 10.2* 10.3* 10.8*  HCT 27.8* 27.0* 29.9* 29.7* 30.9*  MCV 86.9  86.8 87.4 87.6 86.6  PLT 400 407* 467* 445* 487*   Cardiac Enzymes: No results for input(s): CKTOTAL, CKMB, CKMBINDEX, TROPONINI in the last 168 hours. BNP: Invalid input(s):  POCBNP CBG: No results for input(s): GLUCAP in the last 168 hours.  Recent Results (from the past 240 hour(s))  Blood culture (routine x 2)     Status: Abnormal   Collection Time: 04/29/15  4:00 PM  Result Value Ref Range Status   Specimen Description BLOOD RIGHT ANTECUBITAL  Final   Special Requests IN PEDIATRIC BOTTLE 5CC  Final   Culture  Setup Time   Final    GRAM POSITIVE COCCI IN CLUSTERS IN PEDIATRIC BOTTLE CRITICAL RESULT CALLED TO, READ BACK BY AND VERIFIED WITH: M. Fears RN 12:35 04/30/15  ([email or web address])    Culture (A)  Final    STAPHYLOCOCCUS SPECIES (COAGULASE NEGATIVE) THE SIGNIFICANCE OF ISOLATING THIS ORGANISM FROM A SINGLE SET OF BLOOD CULTURES WHEN MULTIPLE SETS ARE DRAWN IS UNCERTAIN. PLEASE NOTIFY THE MICROBIOLOGY DEPARTMENT WITHIN ONE WEEK IF SPECIATION AND SENSITIVITIES ARE REQUIRED. Performed at [facility]    Report Status 05/01/2015 FINAL  Final  Blood culture (routine x 2)     Status: None   Collection Time: 04/29/15  4:10 PM  Result Value Ref Range Status   Specimen Description BLOOD RIGHT HAND  Final   Special Requests BOTTLES DRAWN AEROBIC AND ANAEROBIC 5CC  Final   Culture   Final    NO GROWTH 5 DAYS Performed at [facility]    Report Status 05/04/2015 FINAL  Final  Culture, body fluid-bottle     Status: None   Collection Time: 04/30/15 12:32 PM  Result Value Ref Range Status   Specimen Description FLUID LEFT PLEURAL  Final   Special Requests BOTTLES DRAWN AEROBIC AND ANAEROBIC 10CC  Final   Culture   Final    NO GROWTH 5 DAYS Performed at [facility]    Report Status 05/05/2015 FINAL  Final  Gram stain     Status: None   Collection Time: 04/30/15 12:32 PM  Result Value Ref Range Status   Specimen Description FLUID LEFT PLEURAL  Final   Special Requests NONE Performed at [facility]   Final   Gram Stain   Final    ABUNDANT WBC PRESENT,BOTH PMN AND MONONUCLEAR NO ORGANISMS SEEN    Report Status 04/30/2015 FINAL   Final     Scheduled Meds: . amLODipine  10 mg Oral Daily  . aspirin EC  81 mg Oral Daily  . azithromycin  500 mg Intravenous Q24H  . dextromethorphan-guaiFENesin  2 tablet Oral BID  . feeding supplement (ENSURE ENLIVE)  237 mL Oral BID BM  . multivitamin with minerals  1 tablet Oral Daily  . pravastatin  20 mg Oral q1800   Continuous Infusions: . sodium chloride Stopped (05/06/15 1529)     Lynne Takemoto, DO  [facility] Pager [phone]  If 7PM-7AM, please contact night-coverage [email or web address] Password [email or web address] 05/06/2015, 5:21 PM   LOS: 7 days

## 2015-05-07 ENCOUNTER — Inpatient Hospital Stay (HOSPITAL_COMMUNITY): Payer: PPO

## 2015-05-07 DIAGNOSIS — I359 Nonrheumatic aortic valve disorder, unspecified: Secondary | ICD-10-CM

## 2015-05-07 DIAGNOSIS — I10 Essential (primary) hypertension: Secondary | ICD-10-CM | POA: Diagnosis not present

## 2015-05-07 DIAGNOSIS — N179 Acute kidney failure, unspecified: Secondary | ICD-10-CM | POA: Diagnosis not present

## 2015-05-07 DIAGNOSIS — Z720 Tobacco use: Secondary | ICD-10-CM | POA: Diagnosis not present

## 2015-05-07 DIAGNOSIS — J948 Other specified pleural conditions: Secondary | ICD-10-CM | POA: Diagnosis not present

## 2015-05-07 DIAGNOSIS — J90 Pleural effusion, not elsewhere classified: Secondary | ICD-10-CM | POA: Diagnosis not present

## 2015-05-07 DIAGNOSIS — J189 Pneumonia, unspecified organism: Secondary | ICD-10-CM

## 2015-05-07 DIAGNOSIS — Z4682 Encounter for fitting and adjustment of non-vascular catheter: Secondary | ICD-10-CM | POA: Diagnosis not present

## 2015-05-07 DIAGNOSIS — J869 Pyothorax without fistula: Secondary | ICD-10-CM | POA: Diagnosis not present

## 2015-05-07 LAB — BASIC METABOLIC PANEL
Anion gap: 8 (ref 5–15)
BUN: 17 mg/dL (ref 4–21)
BUN: 17 mg/dL (ref 6–20)
CALCIUM: 7.9 mg/dL — AB (ref 8.9–10.3)
CHLORIDE: 107 mmol/L (ref 101–111)
CO2: 22 mmol/L (ref 22–32)
CREATININE: 0.9 mg/dL (ref 0.6–1.3)
CREATININE: 0.87 mg/dL (ref 0.61–1.24)
GFR calc non Af Amer: >60 mL/min (ref >60)
GLUCOSE: 110 mg/dL
Glucose, Bld: 110 mg/dL — ABNORMAL HIGH (ref 65–99)
Potassium: 4.6 mmol/L (ref 3.5–5.1)
SODIUM: 137 mmol/L (ref 137–147)
Sodium: 137 mmol/L (ref 135–145)

## 2015-05-07 LAB — CBC
HEMATOCRIT: 28.2 % — AB (ref 39.0–52.0)
Hemoglobin: 9.7 g/dL — ABNORMAL LOW (ref 13.0–17.0)
MCH: 30.5 pg (ref 26.0–34.0)
MCHC: 34.4 g/dL (ref 30.0–36.0)
MCV: 88.7 fL (ref 78.0–100.0)
Platelets: 482 10*3/uL — ABNORMAL HIGH (ref 150–400)
RBC: 3.18 MIL/uL — ABNORMAL LOW (ref 4.22–5.81)
RDW: 13.4 % (ref 11.5–15.5)
WBC: 16.2 10*3/uL — ABNORMAL HIGH (ref 4.0–10.5)

## 2015-05-07 MED ORDER — ENSURE ENLIVE PO LIQD: 237.0000 mL | Freq: Two times a day (BID) | ORAL | Status: DC

## 2015-05-07 MED ORDER — AMOXICILLIN-POT CLAVULANATE 875-125 MG PO TABS
1.0000 | ORAL_TABLET | Freq: Two times a day (BID) | ORAL | Status: DC
  Administered 2015-05-07 – 2015-05-08 (×2): 1 via ORAL
  Filled 2015-05-07 (×2): qty 1

## 2015-05-07 NOTE — Discharge Summary (Signed)
Physician Discharge Summary  [name] [id number] DOB: 1926-03-16 DOA: 04/29/2015  PCP: Irven Shelling, MD  Admit date: 04/29/2015 Discharge date: 05/08/2015  Recommendations for Outpatient Follow-up:  1. Pt will need to follow up with PCP in 2 weeks post discharge 2. Please obtain CBC and BMP in one week 3. Follow up with pulmonology in one week--05/14/15  Discharge Diagnoses:  sepsis due to community-acquired pneumonia: -Cont empiric antibiotics azithromycin and Rocephin started on 4.9.2017--had 7 days, then switched to po amox/clav -sepsis physiology resolved -blood culture suggests contaminant -he is afebrile and hemodynamically stable  Large left empyema: 04/30/2015--IR was consulted for thoracocentesis that showed exudate, with a blood glucose less than 20--culture and cytology neg -04/30/15-CT scan showed loculation, PCCM was consulted and Recommended Chest tube placement performed on 05/02/2015 with intermittent suction. tPA was given 4/12 -case discussed with Dr. Joya San tPA instillation 4/15 -05/06/2015--discontinue azithromycin--finished 7 days; continue ceftriaxone -4/17--start amox/clav--plan 28 more days -still with leukocytosis but WBC improved and stable  Acute on chronic kidney disease stage III: -secondary to sepsis environment depletion -Baseline creatinine 0.9-1.1 -improved with IV fluids  Generalized weakness: Physical therapy evaluated the patient-->SNF  Normocytic anemia: Hemoglobin stable.  Essential hypertension: Amlodipine restarted--acceptable BP  Tobacco abuse: Counseling was done.  History of aortic stenosis: -s/p bioprosthetic valve  Coronary artery disease -No chest pain presently -Continue aspirin and statin  Discharge Condition: Stable  Disposition:  Follow-up Information    Follow up with Noe Gens, NP On 05/14/2015.   Specialty:  Pulmonary Disease   Why:  Follow up with lung doctors at 1130 AM.   Contact  information:   [address]  [place] [address] [phone]      skilled nursing facility  Diet: Regular Wt Readings from Last 3 Encounters:  04/29/15 73.2 kg (161 lb 6 oz)  04/25/15 73.256 kg (161 lb 8 oz)  03/08/15 73.029 kg (161 lb)    History of present illness:  82 year old male with a history of hypertension, hyperlipidemia, aortic stenosis status post bioprosthetic valve presented with shortness of breath, coughing, and generalized weakness. Initial presentation revealed WBC 30.7 lactic acid 3.47, and chest x-ray showing large left pleural effusion with left lower lobe atelectasis/infiltrate. The patient was started on ceftriaxone and azithromycin.CT of the chest on 04/30/2015 revealed a large left pleural effusion with left lower lobe opacity. The patient underwent thoracocentesis on 04/30/2015 draining 1.5 L and which revealed WBC 12,482 suggesting empyema. Pulmonology was consulted. He was felt to be a poor candidate for VATS. Therefore, interventional radiology was Consulted.n 05/02/2015,IR placed a drainage catheter in the left chest. TPA was instilled into the chest tube twice during hospitalization. Chest tube drainage ultimately decreased, and the chest tube was discontinued on 05/07/2015.  In addition, the patient was transitioned to oral Augmentin. The patient remained clinically stable and afebrile. He will follow with pulmonology in one week. Consultants: Pulmonary  Discharge Exam: Filed Vitals:   05/07/15 2148 05/08/15 0456  BP: 127/49 126/56  Pulse: 70 80  Temp: 98.8 F (37.1 C) 98.7 F (37.1 C)  Resp: 20 22   Filed Vitals:   05/07/15 0928 05/07/15 1347 05/07/15 2148 05/08/15 0456  BP: 119/50 122/55 127/49 126/56  Pulse: 73 80 70 80  Temp:  97.4 F (36.3 C) 98.8 F (37.1 C) 98.7 F (37.1 C)  TempSrc:  Oral Oral Oral  Resp: 20 24 20 22   Height:      Weight:      SpO2: 96% 97% 96% 94%  General: A&O x 3, NAD, pleasant, cooperative Cardiovascular:  RRR, no rub, no gallop, no S3 Respiratory: Bibasilar rales with diminished breath sounds at left base. Abdomen:soft, nontender, nondistended, positive bowel sounds Extremities: No edema, No lymphangitis, no petechiae  Discharge Instructions      Discharge Instructions    Diet - low sodium heart healthy    Complete by:  As directed      Increase activity slowly    Complete by:  As directed             Medication List    STOP taking these medications        lisinopril 20 MG tablet  Commonly known as:  PRINIVIL,ZESTRIL      TAKE these medications        amLODipine 10 MG tablet  Commonly known as:  NORVASC  Take 1 tablet (10 mg total) by mouth daily.     amoxicillin 500 MG capsule  Commonly known as:  AMOXIL  Take 500 mg by mouth 3 (three) times daily as needed (dental procedure). PRIOR TO DENTAL PROCEDURE.     amoxicillin-clavulanate 875-125 MG tablet  Commonly known as:  AUGMENTIN  Take 1 tablet by mouth every 12 (twelve) hours.     aspirin EC 81 MG tablet  Take 81 mg by mouth daily.     feeding supplement (ENSURE ENLIVE) Liqd  Take 237 mLs by mouth 2 (two) times daily between meals.     multivitamin with minerals tablet  Take 1 tablet by mouth daily.     pravastatin 20 MG tablet  Commonly known as:  PRAVACHOL  TAKE 1 TABLET BY MOUTH EVERY DAY     QUEtiapine 25 MG tablet  Commonly known as:  SEROQUEL  Take 1 tablet (25 mg total) by mouth at bedtime as needed and may repeat dose one time if needed (at bedtime prn).         The results of significant diagnostics from this hospitalization (including imaging, microbiology, ancillary and laboratory) are listed below for reference.    Significant Diagnostic Studies: Dg Chest 1 View  04/30/2015  CLINICAL DATA:  Left thoracentesis EXAM: CHEST 1 VIEW COMPARISON:  04/29/2015 FINDINGS: Moderately large left effusion, decreased in size from yesterday. No pneumothorax. Left lower lobe collapse Right lung clear  IMPRESSION: Moderately large left effusion remains following thoracentesis. No pneumothorax. Electronically Signed   By: Franchot Gallo M.D.   On: 04/30/2015 12:48   Dg Chest 2 View  04/29/2015  CLINICAL DATA:  82 year old with recent onset of cough and delays. EXAM: CHEST  2 VIEW COMPARISON:  11/05/2013 and earlier. FINDINGS: Prior sternotomy for aortic valve replacement and CABG. Cardiac silhouette likely normal in size, partially obscured by a large left pleural effusion. Associated dense consolidation in the left lower lobe and left upper lobe. Right lung clear. Pulmonary vascularity normal. No right pleural effusion. Degenerative changes and DISH involving the thoracic spine. IMPRESSION: Large left pleural effusion and associated dense passive atelectasis and/or pneumonia in the left lower lobe and left upper lobe. Electronically Signed   By: Evangeline Dakin M.D.   On: 04/29/2015 14:53   Ct Chest Wo Contrast  04/30/2015  CLINICAL DATA:  Shortness of breath and cough with generalized weakness beginning several weeks ago. Left pleural effusion on current chest radiograph. EXAM: CT CHEST WITHOUT CONTRAST TECHNIQUE: Multidetector CT imaging of the chest was performed following the standard protocol without IV contrast. COMPARISON:  Chest radiograph, 04/30/2015. FINDINGS: Neck base and axilla:  No mass or adenopathy. Mediastinum and hila: Heart is normal in size. There changes from cardiac surgery and aortic valve replacement. Coronary artery and aortic atherosclerotic calcifications are noted. No mediastinal or hilar masses or enlarged lymph nodes. Lungs and pleura: Large left pleural effusion. There is significant atelectasis of the left lower lobe. There is atelectasis of a portion of the left upper lobe lingula with some minor dependent subsegmental atelectasis in the posterior left upper lobe. A component of left lung base pneumonia is possible. Minimal right pleural effusion with mild dependent right  lower lobe subsegmental atelectasis. No convincing pneumonia and no pulmonary edema. No pneumothorax. Limited upper abdomen:  No acute findings. Musculoskeletal: Degenerative changes throughout the visualized spine. Bones demineralized. No osteoblastic or osteolytic lesions. IMPRESSION: 1. Large left pleural effusion with significant left lower lobe and left upper lobe lingula atelectasis. Consider pneumonia as a component of the parenchymal opacity given the patient's history. 2. Minimal right pleural effusion with associated dependent right lower lobe subsegmental atelectasis. 3. No pulmonary edema. Electronically Signed   By: Lajean Manes M.D.   On: 04/30/2015 15:28   Dg Chest Port 1 View  05/07/2015  CLINICAL DATA:  Left effusion.  Chest tube removal. EXAM: PORTABLE CHEST 1 VIEW 11:18 a.m. COMPARISON:  05/07/2015 at 8:53 a.m. and 05/06/2015 and 05/05/2015 and 04/29/2015 and chest CT dated 04/30/2015 FINDINGS: Left chest tube is been removed. No pneumothorax. Slight residual pleural thickening or loculated fluid laterally and inferiorly at the left base. Improving focal compressive atelectasis in the left lower lobe. Right lung is clear. Heart size and vascularity are normal. Calcification in the thoracic aorta. Aortic valve replacement. IMPRESSION: No pneumothorax after chest tube removal. Slight residual pleural thickening and/or loculated pleural fluid and atelectasis at the left lung base. Electronically Signed   By: Lorriane Shire M.D.   On: 05/07/2015 11:46   Dg Chest Port 1 View  05/07/2015  CLINICAL DATA:  Empyema. EXAM: PORTABLE CHEST 1 VIEW COMPARISON:  May 06, 2015. FINDINGS: Stable cardiomediastinal silhouette. Status post aortic valve replacement. No pneumothorax is noted. Right lung is clear. Left-sided pigtail drainage catheter is unchanged in position. Left-sided loculated pleural effusion is unchanged in size. Stable left basilar opacity is noted concerning for atelectasis or possibly  inflammation. IMPRESSION: Stable loculated left pleural effusion. No change in position of left-sided pigtail drainage catheter. Stable left basilar atelectasis or inflammation is noted Electronically Signed   By: Marijo Conception, M.D.   On: 05/07/2015 09:07   Dg Chest Port 1 View  05/06/2015  CLINICAL DATA:  Acute respiratory failure. Smoker. Prosthetic aortic valve. EXAM: PORTABLE CHEST 1 VIEW COMPARISON:  Yesterday. FINDINGS: Mildly decreased left basilar opacity and pleural fluid. A left basilar pigtail catheter remains in place. No pneumothorax is seen. The right lung is clear. The interstitial markings remain mildly prominent. No gross change in size of the heart with obscuration of the left heart borders. Median sternotomy wires and prosthetic aortic valve. Thoracic spine degenerative changes. IMPRESSION: 1. The mildly improved left basilar atelectasis and possible pneumonia. 2. Mildly decreased left pleural fluid. 3. Stable mild chronic interstitial lung disease. Electronically Signed   By: Claudie Revering M.D.   On: 05/06/2015 07:39   Dg Chest Port 1 View  05/05/2015  CLINICAL DATA:  82 year old male with history of left-sided empyema. History of severe aortic stenosis status post aortic valve replacement. EXAM: PORTABLE CHEST 1 VIEW COMPARISON:  Chest x-ray 05/04/2015. FINDINGS: Left-sided pigtail drainage catheter remains in the  left hemithorax, with the tip projecting over the mid left hemithorax. There continues to be left pleural fluid with irregular contours, indicative of a loculated pleural effusion. Opacities throughout the left mid to lower lung likely reflect associated areas of passive atelectasis and/or airspace consolidation. Right lung is clear. No right pleural effusion. No evidence of pulmonary edema. Heart size appears mildly enlarged (unchanged). Upper mediastinal contours are within normal limits. Atherosclerosis in the thoracic aorta. Status post median sternotomy for CABG and aortic  valve replacement (a stented bio prosthesis is noted). IMPRESSION: 1. Support apparatus and postoperative changes, as above. 2. Persistent moderate loculated left pleural fluid collection compatible with the reported history of empyema. This is associated with extensive areas of atelectasis and/or airspace consolidation throughout the left mid to lower lung. The overall appearance the chest is very similar to the prior examination from 05/04/2015. Electronically Signed   By: Vinnie Langton M.D.   On: 05/05/2015 09:27   Dg Chest Port 1 View  05/04/2015  CLINICAL DATA:  Chest tube. EXAM: PORTABLE CHEST 1 VIEW COMPARISON:  05/03/2015.  CT 04/30/2015 . FINDINGS: Left chest tube in stable position. Pleural fluid collection on the left again noted. Slight decrease in size. Loculation may be present. Left lower lobe atelectasis and or infiltrate . No prominent pneumothorax. Mild pleural air collection along the left base of the along the chest tube courses cannot be entirely excluded . Right lung is clear. Prior CABG and cardiac valve replacement. Stable cardiomegaly. No pulmonary venous congestion. IMPRESSION: 1. Left chest tube in stable position. Left pleural fluid collection has decreased in size. A component of loculation may be present. Left lower lobe atelectasis and/or infiltrate. Small amount of pleural air along the left base along the course of the left chest tube cannot be excluded. Continued follow-up exam suggested. 2.  Prior CABG.  Cardiac valve replacement.  Stable cardiomegaly. Critical Value/emergent results were called by telephone at the time of interpretation on 05/04/2015 at 7:19 am to nurse Lenell Antu , who verbally acknowledged these results. Electronically Signed   By: Marcello Moores  Register   On: 05/04/2015 07:22   Dg Chest Port 1 View  05/03/2015  CLINICAL DATA:  Cough, left effusion EXAM: PORTABLE CHEST 1 VIEW COMPARISON:  CT chest of 04/30/2015, and chest x-ray of 04/30/2015 FINDINGS: The left  chest tube is present and there has been a slight decrease in volume of the left pleural effusion with probable left basilar atelectasis. The right lung is clear. Cardiomegaly is stable and median sternotomy sutures are noted with aortic valve replacement present. IMPRESSION: 1. Left chest tube present with slight decrease in volume of the left pleural effusion. 2. Stable cardiomegaly. Electronically Signed   By: Ivar Drape M.D.   On: 05/03/2015 08:13   Ct Image Guided Drainage By Percutaneous Catheter  05/02/2015  CLINICAL DATA:  Left lung pneumonia with large left pleural empyema. Prior thoracentesis on 04/30/2015 demonstrated fluid consistent with empyema. The patient is not a candidate currently for surgical intervention with VATS. Request has been made to place a percutaneous drainage catheter into the left empyema. EXAM: CT GUIDED DRAINAGE OF LEFT PLEURAL EMPYEMA ANESTHESIA/SEDATION: 0.5 Mg IV Versed 25 mcg IV Fentanyl Total Moderate Sedation Time:  20 minutes. The patient's level of consciousness and physiologic status were continuously monitored during the procedure by Radiology nursing. PROCEDURE: The procedure, risks, benefits, and alternatives were explained to the patient. Questions regarding the procedure were encouraged and answered. The patient understands and consents to the  procedure. A time-out was performed prior to initiating the procedure. The left chest wall was prepped with chlorhexidine in a sterile fashion, and a sterile drape was applied covering the operative field. A sterile gown and sterile gloves were used for the procedure. Local anesthesia was provided with 1% Lidocaine. CT was performed in a supine position. After localizing the left pleural collection, an 18 gauge trocar needle was advanced into the left pleural space from an anterior approach. After return of fluid, a guidewire was advanced through the needle. The tract was dilated over the wire and a 14 French pigtail drainage  catheter advanced. Catheter positioning was confirmed by CT. The catheter was connected to a [place] Pleur-Evac device. It was secured at the skin with a Prolene retention suture, StatLock device and overlying dressing. COMPLICATIONS: None FINDINGS: Initial CT shows significant reaccumulation of a large left pleural effusion which is loculated and has been demonstrated to be consistent with empyema after prior sampling. There is significant consolidation of the left lung. Fluid return demonstrates mildly turbid yellowish fluid from the left pleural space. A 14 French pigtail catheter was placed and is draining well after placement. IMPRESSION: CT-guided left pleural drainage catheter placement to treat empyema. A 14 French pigtail drainage catheter was placed. The catheter was connected to a [place] Pleur-Evac device and will be connected to wall suction at -20 cm of water. Electronically Signed   By: Aletta Edouard M.D.   On: 05/02/2015 16:44   US Thoracentesis Asp Pleural Space W/img Guide  04/30/2015  INDICATION: 82 year old male who was admitted through the emergency department with sepsis secondary to community acquired pneumonia. The patient was found have a large left-sided pleural effusion on chest x-ray. Request has been made for a therapeutic and diagnostic thoracentesis. EXAM: ULTRASOUND GUIDED DIAGNOSTIC AND THERAPEUTIC THORACENTESIS MEDICATIONS: 1% lidocaine COMPLICATIONS: None immediate. PROCEDURE: An ultrasound guided thoracentesis was thoroughly discussed with the patient and questions answered. The benefits, risks, alternatives and complications were also discussed. The patient understands and wishes to proceed with the procedure. Written consent was obtained. Ultrasound was performed to localize and mark an adequate pocket of fluid in the left chest. The area was then prepped and draped in the normal sterile fashion. 1% Lidocaine was used for local anesthesia. Under ultrasound guidance a  Safe-T-Centesis catheter was introduced. Thoracentesis was performed. The procedure was stopped after 1-1/2 L of fluid was obtained. This is standard procedure for a first thoracentesis and the patient had an elevated creatinine as well at 1.56. The catheter was removed and a dressing applied. FINDINGS: A total of approximately 1.5 L of cloudy amber fluid was removed. Multiple loculations were noted on ultrasound. After fluid was removed, the final image revealed that a significant portion of the effusion was removed with this procedure. Samples were sent to the laboratory as requested by the clinical team. IMPRESSION: Successful ultrasound guided left thoracentesis yielding 1.5 L of pleural fluid. Read by: Saverio Danker, PA-C Electronically Signed   By: Jacqulynn Cadet M.D.   On: 04/30/2015 12:46     Microbiology: Recent Results (from the past 240 hour(s))  Blood culture (routine x 2)     Status: Abnormal   Collection Time: 04/29/15  4:00 PM  Result Value Ref Range Status   Specimen Description BLOOD RIGHT ANTECUBITAL  Final   Special Requests IN PEDIATRIC BOTTLE 5CC  Final   Culture  Setup Time   Final    GRAM POSITIVE COCCI IN CLUSTERS IN PEDIATRIC BOTTLE CRITICAL RESULT  CALLED TO, READ BACK BY AND VERIFIED WITH: M. Fears RN 12:35 04/30/15  ([email or web address])    Culture (A)  Final    STAPHYLOCOCCUS SPECIES (COAGULASE NEGATIVE) THE SIGNIFICANCE OF ISOLATING THIS ORGANISM FROM A SINGLE SET OF BLOOD CULTURES WHEN MULTIPLE SETS ARE DRAWN IS UNCERTAIN. PLEASE NOTIFY THE MICROBIOLOGY DEPARTMENT WITHIN ONE WEEK IF SPECIATION AND SENSITIVITIES ARE REQUIRED. Performed at [facility]    Report Status 05/01/2015 FINAL  Final  Blood culture (routine x 2)     Status: None   Collection Time: 04/29/15  4:10 PM  Result Value Ref Range Status   Specimen Description BLOOD RIGHT HAND  Final   Special Requests BOTTLES DRAWN AEROBIC AND ANAEROBIC 5CC  Final   Culture   Final    NO GROWTH 5 DAYS Performed  at [facility]    Report Status 05/04/2015 FINAL  Final  Culture, body fluid-bottle     Status: None   Collection Time: 04/30/15 12:32 PM  Result Value Ref Range Status   Specimen Description FLUID LEFT PLEURAL  Final   Special Requests BOTTLES DRAWN AEROBIC AND ANAEROBIC 10CC  Final   Culture   Final    NO GROWTH 5 DAYS Performed at [facility]    Report Status 05/05/2015 FINAL  Final  Gram stain     Status: None   Collection Time: 04/30/15 12:32 PM  Result Value Ref Range Status   Specimen Description FLUID LEFT PLEURAL  Final   Special Requests NONE Performed at [facility]   Final   Gram Stain   Final    ABUNDANT WBC PRESENT,BOTH PMN AND MONONUCLEAR NO ORGANISMS SEEN    Report Status 04/30/2015 FINAL  Final     Labs: Basic Metabolic Panel:  Recent Labs Lab 05/02/15 0442 05/04/15 0454 05/07/15 0433  NA 137 137 137  K 4.0 4.0 4.6  CL 108 107 107  CO2 21* 22 22  GLUCOSE 153* 139* 110*  BUN 26* 20 17  CREATININE 1.20 1.04 0.87  CALCIUM 8.0* 7.8* 7.9*   Liver Function Tests: No results for input(s): AST, ALT, ALKPHOS, BILITOT, PROT, ALBUMIN in the last 168 hours. No results for input(s): LIPASE, AMYLASE in the last 168 hours. No results for input(s): AMMONIA in the last 168 hours. CBC:  Recent Labs Lab 05/04/15 0454 05/05/15 0448 05/06/15 0529 05/07/15 0433 05/08/15 0444  WBC 16.0* 15.1* 16.6* 16.2* 15.8*  HGB 10.2* 10.3* 10.8* 9.7* 10.3*  HCT 29.9* 29.7* 30.9* 28.2* 30.1*  MCV 87.4 87.6 86.6 88.7 88.8  PLT 467* 445* 487* 482* 513*   Cardiac Enzymes: No results for input(s): CKTOTAL, CKMB, CKMBINDEX, TROPONINI in the last 168 hours. BNP: Invalid input(s): POCBNP CBG: No results for input(s): GLUCAP in the last 168 hours.  Time coordinating discharge:  Greater than 30 minutes  Signed:  Kaitlan Bin, DO [facility] Pager: [phone] 05/08/2015, 12:20 PM

## 2015-05-07 NOTE — Care Management Note (Signed)
Case Management Note  Patient Details  Name: [name] MRN: [id number] Date of Birth: 1926/09/04  Subjective/Objective:                    Action/Plan:   Expected Discharge Date:                  Expected Discharge Plan:  [name]  In-House Referral:     Discharge planning Services  CM Consult  Post Acute Care Choice:    Choice offered to:     DME Arranged:    DME Agency:     HH Arranged:  RN, PT [place] Agency:     Status of Service:  In process, will continue to follow  Medicare Important Message Given:  Yes Date Medicare IM Given:    Medicare IM give by:    Date Additional Medicare IM Given:    Additional Medicare Important Message give by:     If discussed at [place] of Stay Meetings, dates discussed:    Additional CommentsPurcell Mouton, RN 05/07/2015, 12:09 PM

## 2015-05-07 NOTE — Progress Notes (Signed)
Patient ID: [name], male   DOB: 1926-04-17, 82 y.o.   MRN: [id number]    Referring Physician(s): Alva,Rakesh Supervising Physician: Arne Cleveland  Chief Complaint:  Left chest empyema/chest tube  Subjective:  Pt doing ok; has some soreness at left chest tube site; denies worsening CP, dyspnea, cough  Allergies: Review of patient's allergies indicates no known allergies.  Medications: Prior to Admission medications   Medication Sig Start Date End Date Taking? Authorizing Provider  amLODipine (NORVASC) 10 MG tablet Take 1 tablet (10 mg total) by mouth daily. 03/08/15  Yes Jettie Booze, MD  amoxicillin (AMOXIL) 500 MG capsule Take 500 mg by mouth 3 (three) times daily as needed (dental procedure). PRIOR TO DENTAL PROCEDURE.   Yes Historical Provider, MD  aspirin EC 81 MG tablet Take 81 mg by mouth daily.   Yes Historical Provider, MD  Multiple Vitamins-Minerals (MULTIVITAMIN WITH MINERALS) tablet Take 1 tablet by mouth daily.   Yes Historical Provider, MD  pravastatin (PRAVACHOL) 20 MG tablet TAKE 1 TABLET BY MOUTH EVERY DAY 03/08/15  Yes Jettie Booze, MD  lisinopril (PRINIVIL,ZESTRIL) 20 MG tablet Take 2 tablets (40 mg total) by mouth daily. Patient not taking: Reported on 04/25/2015 03/08/15   Jettie Booze, MD     Vital Signs: BP 119/50 mmHg  Pulse 73  Temp(Src) 98.9 F (37.2 C) (Oral)  Resp 20  Ht 5\' 6"  (1.676 m)  Wt 161 lb 6 oz (73.2 kg)  BMI 26.06 kg/m2  SpO2 96%  Physical Exam awake/alert; left chest tube intact, dressing dry;  output about 70 cc since yesterday; no air leak  Imaging: Dg Chest Port 1 View  05/07/2015  CLINICAL DATA:  Empyema. EXAM: PORTABLE CHEST 1 VIEW COMPARISON:  May 06, 2015. FINDINGS: Stable cardiomediastinal silhouette. Status post aortic valve replacement. No pneumothorax is noted. Right lung is clear. Left-sided pigtail drainage catheter is unchanged in position. Left-sided loculated pleural effusion is unchanged  in size. Stable left basilar opacity is noted concerning for atelectasis or possibly inflammation. IMPRESSION: Stable loculated left pleural effusion. No change in position of left-sided pigtail drainage catheter. Stable left basilar atelectasis or inflammation is noted Electronically Signed   By: Marijo Conception, M.D.   On: 05/07/2015 09:07   Dg Chest Port 1 View  05/06/2015  CLINICAL DATA:  Acute respiratory failure. Smoker. Prosthetic aortic valve. EXAM: PORTABLE CHEST 1 VIEW COMPARISON:  Yesterday. FINDINGS: Mildly decreased left basilar opacity and pleural fluid. A left basilar pigtail catheter remains in place. No pneumothorax is seen. The right lung is clear. The interstitial markings remain mildly prominent. No gross change in size of the heart with obscuration of the left heart borders. Median sternotomy wires and prosthetic aortic valve. Thoracic spine degenerative changes. IMPRESSION: 1. The mildly improved left basilar atelectasis and possible pneumonia. 2. Mildly decreased left pleural fluid. 3. Stable mild chronic interstitial lung disease. Electronically Signed   By: Claudie Revering M.D.   On: 05/06/2015 07:39   Dg Chest Port 1 View  05/05/2015  CLINICAL DATA:  82 year old male with history of left-sided empyema. History of severe aortic stenosis status post aortic valve replacement. EXAM: PORTABLE CHEST 1 VIEW COMPARISON:  Chest x-ray 05/04/2015. FINDINGS: Left-sided pigtail drainage catheter remains in the left hemithorax, with the tip projecting over the mid left hemithorax. There continues to be left pleural fluid with irregular contours, indicative of a loculated pleural effusion. Opacities throughout the left mid to lower lung likely reflect associated areas of passive  atelectasis and/or airspace consolidation. Right lung is clear. No right pleural effusion. No evidence of pulmonary edema. Heart size appears mildly enlarged (unchanged). Upper mediastinal contours are within normal limits.  Atherosclerosis in the thoracic aorta. Status post median sternotomy for CABG and aortic valve replacement (a stented bio prosthesis is noted). IMPRESSION: 1. Support apparatus and postoperative changes, as above. 2. Persistent moderate loculated left pleural fluid collection compatible with the reported history of empyema. This is associated with extensive areas of atelectasis and/or airspace consolidation throughout the left mid to lower lung. The overall appearance the chest is very similar to the prior examination from 05/04/2015. Electronically Signed   By: Vinnie Langton M.D.   On: 05/05/2015 09:27   Dg Chest Port 1 View  05/04/2015  CLINICAL DATA:  Chest tube. EXAM: PORTABLE CHEST 1 VIEW COMPARISON:  05/03/2015.  CT 04/30/2015 . FINDINGS: Left chest tube in stable position. Pleural fluid collection on the left again noted. Slight decrease in size. Loculation may be present. Left lower lobe atelectasis and or infiltrate . No prominent pneumothorax. Mild pleural air collection along the left base of the along the chest tube courses cannot be entirely excluded . Right lung is clear. Prior CABG and cardiac valve replacement. Stable cardiomegaly. No pulmonary venous congestion. IMPRESSION: 1. Left chest tube in stable position. Left pleural fluid collection has decreased in size. A component of loculation may be present. Left lower lobe atelectasis and/or infiltrate. Small amount of pleural air along the left base along the course of the left chest tube cannot be excluded. Continued follow-up exam suggested. 2.  Prior CABG.  Cardiac valve replacement.  Stable cardiomegaly. Critical Value/emergent results were called by telephone at the time of interpretation on 05/04/2015 at 7:19 am to nurse Lenell Antu , who verbally acknowledged these results. Electronically Signed   By: Marcello Moores  Register   On: 05/04/2015 07:22    Labs:  CBC:  Recent Labs  05/04/15 0454 05/05/15 0448 05/06/15 0529 05/07/15 0433  WBC  16.0* 15.1* 16.6* 16.2*  HGB 10.2* 10.3* 10.8* 9.7*  HCT 29.9* 29.7* 30.9* 28.2*  PLT 467* 445* 487* 482*    COAGS:  Recent Labs  04/29/15 1818 05/02/15 1313  INR 1.72* 1.59*  APTT 46* 41*    BMP:  Recent Labs  04/30/15 0441 05/02/15 0442 05/04/15 0454 05/07/15 0433  NA 135 137 137 137  K 4.8 4.0 4.0 4.6  CL 112* 108 107 107  CO2 19* 21* 22 22  GLUCOSE 146* 153* 139* 110*  BUN 30* 26* 20 17  CALCIUM 8.4* 8.0* 7.8* 7.9*  CREATININE 1.52* 1.20 1.04 0.87  GFRNONAA 40* 53* >60 >60  GFRAA 46* >60 >60 >60    LIVER FUNCTION TESTS:  Recent Labs  04/29/15 1429  BILITOT 1.2  AST 25  ALT 24  ALKPHOS 92  PROT 7.4  ALBUMIN 2.9*    Assessment and Plan: S/p left chest empyema drainage 05/02/15; CXR today stable; AF; pleural fluid cx's neg; drain output minimal; case d/w Dr. Halford Chessman and decision made to remove chest tube today; left chest tube removed in its entirety without immediate complications. Vaseline gauze dressing applied to site. F/u CXR pending.   Electronically Signed: D. Rowe Robert 05/07/2015, 10:59 AM   I spent a total of 20 minutes at the the patient's bedside AND on the patient's hospital floor or unit, greater than 50% of which was counseling/coordinating care for left chest drain

## 2015-05-07 NOTE — Clinical Social Work Placement (Signed)
   CLINICAL SOCIAL WORK PLACEMENT  NOTE  Date:  05/07/2015  Patient Details  Name: [name] MRN: [id number] Date of Birth: Oct 12, 1926  Clinical Social Work is seeking post-discharge placement for this patient at the [place] level of care (*CSW will initial, date and re-position this form in  chart as items are completed):  Yes   Patient/family provided with [facility] Work Department's list of facilities offering this level of care within the geographic area requested by the patient (or if unable, by the patient's family).  Yes   Patient/family informed of their freedom to choose among providers that offer the needed level of care, that participate in Medicare, Medicaid or managed care program needed by the patient, have an available bed and are willing to accept the patient.  Yes   Patient/family informed of [facility]'s ownership interest in [facility] and [facility], as well as of the fact that they are under no obligation to receive care at these facilities.  PASRR submitted to EDS on 05/07/15     PASRR number received on 05/07/15     Existing PASRR number confirmed on       FL2 transmitted to all facilities in geographic area requested by pt/family on       FL2 transmitted to all facilities within larger geographic area on 05/07/15     Patient informed that his/her managed care company has contracts with or will negotiate with certain facilities, including the following:            Patient/family informed of bed offers received.  Patient chooses bed at       Physician recommends and patient chooses bed at      Patient to be transferred to   on  .  Patient to be transferred to facility by       Patient family notified on   of transfer.  Name of family member notified:        PHYSICIAN       Additional Comment:    _______________________________________________ Harlon Flor, Student-SW 05/07/2015, 2:12 PM

## 2015-05-07 NOTE — Clinical Social Work Note (Signed)
Clinical Social Work Assessment  Patient Details  Name: [name] MRN: [id number] Date of Birth: 03/25/1926  Date of referral:  05/07/15               Reason for consult:  Facility Placement                Permission sought to share information with:  Facility Contact Representative Permission granted to share information::  Yes, Verbal Permission Granted  Name::        Agency::  [place] [facility]  Relationship::     Contact Information:     Housing/Transportation Living arrangements for the past 2 months:  Single Family Home Source of Information:  Patient Patient Interpreter Needed:  None Criminal Activity/Legal Involvement Pertinent to Current Situation/Hospitalization:  No - Comment as needed Significant Relationships:  Spouse Lives with:  Spouse Do you feel safe going back to the place where you live?  Yes Need for family participation in patient care:  No (Coment)  Care giving concerns:  Pt admitted from home with spouse. PT recommending HH vs. 24 hour Supervision/Assistance or SNF if not available.   Social Worker assessment / plan:  CSW received notification pt is refusing HH services and is interested in SNF. BSW Interm met with pt at bedside. BSW Intern introduced self and explained role. Pt states he lives at home with wife. Pt is agreeable to [place] [facility] search. Pt is not familiar with any facilities.  BSW Intern to complete FL2 and conduct [place] [facility] search.  BSW Intern to follow-up with bed offers.  CSW continuing to follow.   Employment status:  Retired, Other (Comment) Insurance information:   ([employer]) PT Recommendations:  Home with Home Health, 24 Hour Supervision Information / Referral to community resources:  Skilled Nursing Facility  Patient/Family's Response to care:  Pt alert and oriented x4. Pt agreeable to [place] SNF search.  Patient/Family's Understanding of and Emotional Response to Diagnosis,  Current Treatment, and Prognosis:  Pt reports no further questions at this time.   Emotional Assessment Appearance:  Appears stated age Attitude/Demeanor/Rapport:  Other (Appropriate) Affect (typically observed):  Accepting, Calm Orientation:  Oriented to Self, Oriented to Place, Oriented to  Time, Oriented to Situation Alcohol / Substance use:  Not Applicable Psych involvement (Current and /or in the community):  No (Comment)  Discharge Needs  Concerns to be addressed:  Care Coordination Readmission within the last 30 days:  No Current discharge risk:  None Barriers to Discharge:  No Barriers Identified    , Student-SW 05/07/2015, 1:23 PM  

## 2015-05-07 NOTE — Progress Notes (Signed)
Physical Therapy Treatment Patient Details Name: [name] MRN: [id number] DOB: July 10, 1926 Today's Date: May 12, 2015    History of Present Illness Pt is an 82 year old male with hx of memory loss, aortic valve replacement, HTN and admitted with large left pleural effusion and CAP with Empyema    PT Comments    Pt assisted to bathroom and then ambulated short distance in hallway.  Pt fatigues quickly.  Follow Up Recommendations  Home health PT;Supervision/Assistance - 24 hour (may need SNF if assist not available at home)     Equipment Recommendations  Rolling walker with 5" wheels    Recommendations for Other Services       Precautions / Restrictions Precautions Precautions: Fall Precaution Comments: L chest tube    Mobility  Bed Mobility Overal bed mobility: Needs Assistance Bed Mobility: Supine to Sit;Sit to Supine     Supine to sit: Min guard Sit to supine: Min guard   General bed mobility comments: increased time and effort but no physical assist required  Transfers Overall transfer level: Needs assistance Equipment used: Rolling walker (2 wheeled) Transfers: Sit to/from Stand Sit to Stand: Min guard         General transfer comment: verbal cues for safe technique  Ambulation/Gait Ambulation/Gait assistance: Min guard Ambulation Distance (Feet): 25 Feet Assistive device: Rolling walker (2 wheeled) Gait Pattern/deviations: Step-through pattern;Decreased stride length     General Gait Details: decreased speed, verbal cues for use of RW, pt ambulated to bathroom before hallway so fatigued quickly   Stairs            Wheelchair Mobility    Modified Rankin (Stroke Patients Only)       Balance                                    Cognition Arousal/Alertness: Awake/alert Behavior During Therapy: WFL for tasks assessed/performed Overall Cognitive Status: Within Functional Limits for tasks assessed                       Exercises      General Comments        Pertinent Vitals/Pain Pain Assessment: No/denies pain    Home Living                      Prior Function            PT Goals (current goals can now be found in the care plan section) Progress towards PT goals: Progressing toward goals    Frequency  Min 3X/week    PT Plan Current plan remains appropriate    Co-evaluation             End of Session Equipment Utilized During Treatment: Gait belt Activity Tolerance: Patient limited by fatigue Patient left: in bed;with call bell/phone within reach;with bed alarm set;with nursing/sitter in room     Time: 1012-1032 PT Time Calculation (min) (ACUTE ONLY): 20 min  Charges:  $Gait Training: 8-22 mins                    G Codes:      Mattison Stuckey,KATHrine E May 12, 2015, 1:04 PM Carmelia Bake, PT, DPT 05/12/15 Pager: [phone]

## 2015-05-07 NOTE — Progress Notes (Signed)
PROGRESS NOTE  [name] [id number] DOB: 1926/10/02 DOA: 04/29/2015 PCP: Irven Shelling, MD  Brief History 82 year old male with a history of hypertension, hyperlipidemia, aortic stenosis status post bioprosthetic valve presented with shortness of breath, coughing, and generalized weakness. Initial presentation revealed WBC 30.7 lactic acid 3.47, and chest x-ray showing large left pleural effusion with left lower lobe atelectasis/infiltrate. The patient was started on ceftriaxone and azithromycin.CT of the chest on 04/30/2015 revealed a large left pleural effusion with left lower lobe opacity. The patient underwent thoracocentesis on 04/30/2015 draining 1.5 L and which revealed WBC 12,482 suggesting empyema. Pulmonology was consulted. He was felt to be a poor candidate for VATS. Therefore, interventional radiology was Consulted.n 05/02/2015,IR placed a drainage catheter in the left chest.  TPA was instilled into the chest tube twice during hospitalization. Chest tube drainage ultimately decreased, and the chest tube was discontinued on 05/07/2015. Assessment/Plan: sepsis due to community-acquired pneumonia: -Cont empiric antibiotics azithromycin and Rocephin started on 4.9.2017 -sepsis physiology resolved -blood culture suggests contaminant -he is afebrile and hemodynamically stable  Large left empyema: 04/30/2015--IR was consulted for thoracocentesis that showed exudate, with a blood glucose less than 20--culture and cytology neg -04/30/15-CT scan showed loculation, PCCM was consulted and Recommended Chest tube placement performed on 05/02/2015 with intermittent suction. tPA was given 4/12 -case discussed with Dr. Joya San tPA instillation 4/15 -05/06/2015--discontinue azithromycin--finished 7 days; continue ceftriaxone -4/17--start amox/clav--plan 28 more days -still with leukocytosis but WBC improved and stable  Acute on chronic kidney disease stage  III: -secondary to sepsis environment depletion -Baseline creatinine 0.9-1.1 -improved with IV fluids  Generalized weakness: Physical therapy evaluated the patient at that recommended PT at home.  Normocytic anemia: Hemoglobin stable.  Essential hypertension: Amlodipine restarted--acceptable BP  Tobacco abuse: Counseling was done.  History of aortic stenosis: -s/p bioprosthetic valve  Coronary artery disease -No chest pain presently -Continue aspirin and statin   Family Communication: No family at beside Disposition Plan: SNF 05/08/15    Procedures/Studies: Dg Chest 1 View  04/30/2015  CLINICAL DATA:  Left thoracentesis EXAM: CHEST 1 VIEW COMPARISON:  04/29/2015 FINDINGS: Moderately large left effusion, decreased in size from yesterday. No pneumothorax. Left lower lobe collapse Right lung clear IMPRESSION: Moderately large left effusion remains following thoracentesis. No pneumothorax. Electronically Signed   By: Franchot Gallo M.D.   On: 04/30/2015 12:48   Dg Chest 2 View  04/29/2015  CLINICAL DATA:  82 year old with recent onset of cough and delays. EXAM: CHEST  2 VIEW COMPARISON:  11/05/2013 and earlier. FINDINGS: Prior sternotomy for aortic valve replacement and CABG. Cardiac silhouette likely normal in size, partially obscured by a large left pleural effusion. Associated dense consolidation in the left lower lobe and left upper lobe. Right lung clear. Pulmonary vascularity normal. No right pleural effusion. Degenerative changes and DISH involving the thoracic spine. IMPRESSION: Large left pleural effusion and associated dense passive atelectasis and/or pneumonia in the left lower lobe and left upper lobe. Electronically Signed   By: Evangeline Dakin M.D.   On: 04/29/2015 14:53   Ct Chest Wo Contrast  04/30/2015  CLINICAL DATA:  Shortness of breath and cough with generalized weakness beginning several weeks ago. Left pleural effusion on current chest radiograph. EXAM: CT  CHEST WITHOUT CONTRAST TECHNIQUE: Multidetector CT imaging of the chest was performed following the standard protocol without IV contrast. COMPARISON:  Chest radiograph, 04/30/2015. FINDINGS: Neck base and axilla:  No mass or adenopathy. Mediastinum and hila:  Heart is normal in size. There changes from cardiac surgery and aortic valve replacement. Coronary artery and aortic atherosclerotic calcifications are noted. No mediastinal or hilar masses or enlarged lymph nodes. Lungs and pleura: Large left pleural effusion. There is significant atelectasis of the left lower lobe. There is atelectasis of a portion of the left upper lobe lingula with some minor dependent subsegmental atelectasis in the posterior left upper lobe. A component of left lung base pneumonia is possible. Minimal right pleural effusion with mild dependent right lower lobe subsegmental atelectasis. No convincing pneumonia and no pulmonary edema. No pneumothorax. Limited upper abdomen:  No acute findings. Musculoskeletal: Degenerative changes throughout the visualized spine. Bones demineralized. No osteoblastic or osteolytic lesions. IMPRESSION: 1. Large left pleural effusion with significant left lower lobe and left upper lobe lingula atelectasis. Consider pneumonia as a component of the parenchymal opacity given the patient's history. 2. Minimal right pleural effusion with associated dependent right lower lobe subsegmental atelectasis. 3. No pulmonary edema. Electronically Signed   By: Lajean Manes M.D.   On: 04/30/2015 15:28   Dg Chest Port 1 View  05/07/2015  CLINICAL DATA:  Left effusion.  Chest tube removal. EXAM: PORTABLE CHEST 1 VIEW 11:18 a.m. COMPARISON:  05/07/2015 at 8:53 a.m. and 05/06/2015 and 05/05/2015 and 04/29/2015 and chest CT dated 04/30/2015 FINDINGS: Left chest tube is been removed. No pneumothorax. Slight residual pleural thickening or loculated fluid laterally and inferiorly at the left base. Improving focal compressive  atelectasis in the left lower lobe. Right lung is clear. Heart size and vascularity are normal. Calcification in the thoracic aorta. Aortic valve replacement. IMPRESSION: No pneumothorax after chest tube removal. Slight residual pleural thickening and/or loculated pleural fluid and atelectasis at the left lung base. Electronically Signed   By: Lorriane Shire M.D.   On: 05/07/2015 11:46   Dg Chest Port 1 View  05/07/2015  CLINICAL DATA:  Empyema. EXAM: PORTABLE CHEST 1 VIEW COMPARISON:  May 06, 2015. FINDINGS: Stable cardiomediastinal silhouette. Status post aortic valve replacement. No pneumothorax is noted. Right lung is clear. Left-sided pigtail drainage catheter is unchanged in position. Left-sided loculated pleural effusion is unchanged in size. Stable left basilar opacity is noted concerning for atelectasis or possibly inflammation. IMPRESSION: Stable loculated left pleural effusion. No change in position of left-sided pigtail drainage catheter. Stable left basilar atelectasis or inflammation is noted Electronically Signed   By: Marijo Conception, M.D.   On: 05/07/2015 09:07   Dg Chest Port 1 View  05/06/2015  CLINICAL DATA:  Acute respiratory failure. Smoker. Prosthetic aortic valve. EXAM: PORTABLE CHEST 1 VIEW COMPARISON:  Yesterday. FINDINGS: Mildly decreased left basilar opacity and pleural fluid. A left basilar pigtail catheter remains in place. No pneumothorax is seen. The right lung is clear. The interstitial markings remain mildly prominent. No gross change in size of the heart with obscuration of the left heart borders. Median sternotomy wires and prosthetic aortic valve. Thoracic spine degenerative changes. IMPRESSION: 1. The mildly improved left basilar atelectasis and possible pneumonia. 2. Mildly decreased left pleural fluid. 3. Stable mild chronic interstitial lung disease. Electronically Signed   By: Claudie Revering M.D.   On: 05/06/2015 07:39   Dg Chest Port 1 View  05/05/2015  CLINICAL  DATA:  82 year old male with history of left-sided empyema. History of severe aortic stenosis status post aortic valve replacement. EXAM: PORTABLE CHEST 1 VIEW COMPARISON:  Chest x-ray 05/04/2015. FINDINGS: Left-sided pigtail drainage catheter remains in the left hemithorax, with the tip projecting over  the mid left hemithorax. There continues to be left pleural fluid with irregular contours, indicative of a loculated pleural effusion. Opacities throughout the left mid to lower lung likely reflect associated areas of passive atelectasis and/or airspace consolidation. Right lung is clear. No right pleural effusion. No evidence of pulmonary edema. Heart size appears mildly enlarged (unchanged). Upper mediastinal contours are within normal limits. Atherosclerosis in the thoracic aorta. Status post median sternotomy for CABG and aortic valve replacement (a stented bio prosthesis is noted). IMPRESSION: 1. Support apparatus and postoperative changes, as above. 2. Persistent moderate loculated left pleural fluid collection compatible with the reported history of empyema. This is associated with extensive areas of atelectasis and/or airspace consolidation throughout the left mid to lower lung. The overall appearance the chest is very similar to the prior examination from 05/04/2015. Electronically Signed   By: Vinnie Langton M.D.   On: 05/05/2015 09:27   Dg Chest Port 1 View  05/04/2015  CLINICAL DATA:  Chest tube. EXAM: PORTABLE CHEST 1 VIEW COMPARISON:  05/03/2015.  CT 04/30/2015 . FINDINGS: Left chest tube in stable position. Pleural fluid collection on the left again noted. Slight decrease in size. Loculation may be present. Left lower lobe atelectasis and or infiltrate . No prominent pneumothorax. Mild pleural air collection along the left base of the along the chest tube courses cannot be entirely excluded . Right lung is clear. Prior CABG and cardiac valve replacement. Stable cardiomegaly. No pulmonary venous  congestion. IMPRESSION: 1. Left chest tube in stable position. Left pleural fluid collection has decreased in size. A component of loculation may be present. Left lower lobe atelectasis and/or infiltrate. Small amount of pleural air along the left base along the course of the left chest tube cannot be excluded. Continued follow-up exam suggested. 2.  Prior CABG.  Cardiac valve replacement.  Stable cardiomegaly. Critical Value/emergent results were called by telephone at the time of interpretation on 05/04/2015 at 7:19 am to nurse Lenell Antu , who verbally acknowledged these results. Electronically Signed   By: Marcello Moores  Register   On: 05/04/2015 07:22   Dg Chest Port 1 View  05/03/2015  CLINICAL DATA:  Cough, left effusion EXAM: PORTABLE CHEST 1 VIEW COMPARISON:  CT chest of 04/30/2015, and chest x-ray of 04/30/2015 FINDINGS: The left chest tube is present and there has been a slight decrease in volume of the left pleural effusion with probable left basilar atelectasis. The right lung is clear. Cardiomegaly is stable and median sternotomy sutures are noted with aortic valve replacement present. IMPRESSION: 1. Left chest tube present with slight decrease in volume of the left pleural effusion. 2. Stable cardiomegaly. Electronically Signed   By: Ivar Drape M.D.   On: 05/03/2015 08:13   Ct Image Guided Drainage By Percutaneous Catheter  05/02/2015  CLINICAL DATA:  Left lung pneumonia with large left pleural empyema. Prior thoracentesis on 04/30/2015 demonstrated fluid consistent with empyema. The patient is not a candidate currently for surgical intervention with VATS. Request has been made to place a percutaneous drainage catheter into the left empyema. EXAM: CT GUIDED DRAINAGE OF LEFT PLEURAL EMPYEMA ANESTHESIA/SEDATION: 0.5 Mg IV Versed 25 mcg IV Fentanyl Total Moderate Sedation Time:  20 minutes. The patient's level of consciousness and physiologic status were continuously monitored during the procedure by Radiology  nursing. PROCEDURE: The procedure, risks, benefits, and alternatives were explained to the patient. Questions regarding the procedure were encouraged and answered. The patient understands and consents to the procedure. A time-out was performed prior to  initiating the procedure. The left chest wall was prepped with chlorhexidine in a sterile fashion, and a sterile drape was applied covering the operative field. A sterile gown and sterile gloves were used for the procedure. Local anesthesia was provided with 1% Lidocaine. CT was performed in a supine position. After localizing the left pleural collection, an 18 gauge trocar needle was advanced into the left pleural space from an anterior approach. After return of fluid, a guidewire was advanced through the needle. The tract was dilated over the wire and a 14 French pigtail drainage catheter advanced. Catheter positioning was confirmed by CT. The catheter was connected to a [place] Pleur-Evac device. It was secured at the skin with a Prolene retention suture, StatLock device and overlying dressing. COMPLICATIONS: None FINDINGS: Initial CT shows significant reaccumulation of a large left pleural effusion which is loculated and has been demonstrated to be consistent with empyema after prior sampling. There is significant consolidation of the left lung. Fluid return demonstrates mildly turbid yellowish fluid from the left pleural space. A 14 French pigtail catheter was placed and is draining well after placement. IMPRESSION: CT-guided left pleural drainage catheter placement to treat empyema. A 14 French pigtail drainage catheter was placed. The catheter was connected to a [place] Pleur-Evac device and will be connected to wall suction at -20 cm of water. Electronically Signed   By: Aletta Edouard M.D.   On: 05/02/2015 16:44   US Thoracentesis Asp Pleural Space W/img Guide  04/30/2015  INDICATION: 82 year old male who was admitted through the emergency department with  sepsis secondary to community acquired pneumonia. The patient was found have a large left-sided pleural effusion on chest x-ray. Request has been made for a therapeutic and diagnostic thoracentesis. EXAM: ULTRASOUND GUIDED DIAGNOSTIC AND THERAPEUTIC THORACENTESIS MEDICATIONS: 1% lidocaine COMPLICATIONS: None immediate. PROCEDURE: An ultrasound guided thoracentesis was thoroughly discussed with the patient and questions answered. The benefits, risks, alternatives and complications were also discussed. The patient understands and wishes to proceed with the procedure. Written consent was obtained. Ultrasound was performed to localize and mark an adequate pocket of fluid in the left chest. The area was then prepped and draped in the normal sterile fashion. 1% Lidocaine was used for local anesthesia. Under ultrasound guidance a Safe-T-Centesis catheter was introduced. Thoracentesis was performed. The procedure was stopped after 1-1/2 L of fluid was obtained. This is standard procedure for a first thoracentesis and the patient had an elevated creatinine as well at 1.56. The catheter was removed and a dressing applied. FINDINGS: A total of approximately 1.5 L of cloudy amber fluid was removed. Multiple loculations were noted on ultrasound. After fluid was removed, the final image revealed that a significant portion of the effusion was removed with this procedure. Samples were sent to the laboratory as requested by the clinical team. IMPRESSION: Successful ultrasound guided left thoracentesis yielding 1.5 L of pleural fluid. Read by: Saverio Danker, PA-C Electronically Signed   By: Jacqulynn Cadet M.D.   On: 04/30/2015 12:46         Subjective: Overall, patient is feeling better. Denies any fevers, chills, chest pain, shortness of breath, nausea, vomiting, diarrhea, abdominal pain, dysuria, hematuria. No rashes.  Objective: Filed Vitals:   05/06/15 2124 05/07/15 0530 05/07/15 0928 05/07/15 1347  BP: 126/51  124/52 119/50 122/55  Pulse: 74 77 73 80  Temp: 98.7 F (37.1 C) 98.9 F (37.2 C)  97.4 F (36.3 C)  TempSrc: Oral Oral  Oral  Resp: 20 22 20  24  Height:      Weight:      SpO2: 94% 94% 96% 97%    Intake/Output Summary (Last 24 hours) at 05/07/15 1717 Last data filed at 05/07/15 1210  Gross per 24 hour  Intake   1120 ml  Output    420 ml  Net    700 ml   Weight change:  Exam:   General:  Pt is alert, follows commands appropriately, not in acute distress  HEENT: No icterus, No thrush, No neck mass, [place]/AT  Cardiovascular: RRR, S1/S2, no rubs, no gallops  Respiratory: Bibasilar crackles. Diminished breath sounds left base. No wheezing.  Abdomen: Soft/+BS, non tender, non distended, no guarding  Extremities: No edema, No lymphangitis, No petechiae, No rashes, no synovitis  Data Reviewed: Basic Metabolic Panel:  Recent Labs Lab 05/02/15 0442 05/04/15 0454 05/07/15 0433  NA 137 137 137  K 4.0 4.0 4.6  CL 108 107 107  CO2 21* 22 22  GLUCOSE 153* 139* 110*  BUN 26* 20 17  CREATININE 1.20 1.04 0.87  CALCIUM 8.0* 7.8* 7.9*   Liver Function Tests: No results for input(s): AST, ALT, ALKPHOS, BILITOT, PROT, ALBUMIN in the last 168 hours. No results for input(s): LIPASE, AMYLASE in the last 168 hours. No results for input(s): AMMONIA in the last 168 hours. CBC:  Recent Labs Lab 05/02/15 0442 05/04/15 0454 05/05/15 0448 05/06/15 0529 05/07/15 0433  WBC 22.7* 16.0* 15.1* 16.6* 16.2*  HGB 9.3* 10.2* 10.3* 10.8* 9.7*  HCT 27.0* 29.9* 29.7* 30.9* 28.2*  MCV 86.8 87.4 87.6 86.6 88.7  PLT 407* 467* 445* 487* 482*   Cardiac Enzymes: No results for input(s): CKTOTAL, CKMB, CKMBINDEX, TROPONINI in the last 168 hours. BNP: Invalid input(s): POCBNP CBG: No results for input(s): GLUCAP in the last 168 hours.  Recent Results (from the past 240 hour(s))  Blood culture (routine x 2)     Status: Abnormal   Collection Time: 04/29/15  4:00 PM  Result Value Ref Range  Status   Specimen Description BLOOD RIGHT ANTECUBITAL  Final   Special Requests IN PEDIATRIC BOTTLE 5CC  Final   Culture  Setup Time   Final    GRAM POSITIVE COCCI IN CLUSTERS IN PEDIATRIC BOTTLE CRITICAL RESULT CALLED TO, READ BACK BY AND VERIFIED WITH: M. Fears RN 12:35 04/30/15  ([email or web address])    Culture (A)  Final    STAPHYLOCOCCUS SPECIES (COAGULASE NEGATIVE) THE SIGNIFICANCE OF ISOLATING THIS ORGANISM FROM A SINGLE SET OF BLOOD CULTURES WHEN MULTIPLE SETS ARE DRAWN IS UNCERTAIN. PLEASE NOTIFY THE MICROBIOLOGY DEPARTMENT WITHIN ONE WEEK IF SPECIATION AND SENSITIVITIES ARE REQUIRED. Performed at [facility]    Report Status 05/01/2015 FINAL  Final  Blood culture (routine x 2)     Status: None   Collection Time: 04/29/15  4:10 PM  Result Value Ref Range Status   Specimen Description BLOOD RIGHT HAND  Final   Special Requests BOTTLES DRAWN AEROBIC AND ANAEROBIC 5CC  Final   Culture   Final    NO GROWTH 5 DAYS Performed at [facility]    Report Status 05/04/2015 FINAL  Final  Culture, body fluid-bottle     Status: None   Collection Time: 04/30/15 12:32 PM  Result Value Ref Range Status   Specimen Description FLUID LEFT PLEURAL  Final   Special Requests BOTTLES DRAWN AEROBIC AND ANAEROBIC 10CC  Final   Culture   Final    NO GROWTH 5 DAYS Performed at [facility]  Report Status 05/05/2015 FINAL  Final  Gram stain     Status: None   Collection Time: 04/30/15 12:32 PM  Result Value Ref Range Status   Specimen Description FLUID LEFT PLEURAL  Final   Special Requests NONE Performed at [facility]   Final   Gram Stain   Final    ABUNDANT WBC PRESENT,BOTH PMN AND MONONUCLEAR NO ORGANISMS SEEN    Report Status 04/30/2015 FINAL  Final     Scheduled Meds: . amLODipine  10 mg Oral Daily  . amoxicillin-clavulanate  1 tablet Oral Q12H  . aspirin EC  81 mg Oral Daily  . dextromethorphan-guaiFENesin  2 tablet Oral BID  . feeding supplement (ENSURE  ENLIVE)  237 mL Oral BID BM  . multivitamin with minerals  1 tablet Oral Daily  . pravastatin  20 mg Oral q1800   Continuous Infusions: . sodium chloride Stopped (05/06/15 1529)     Kennidy Lamke, DO  [facility] Pager [phone]  If 7PM-7AM, please contact night-coverage [email or web address] Password [email or web address] 05/07/2015, 5:17 PM   LOS: 8 days

## 2015-05-07 NOTE — Progress Notes (Signed)
Late note. Asked pt on 4/11 and 05/04/15 about going home with [facility] and pt refused each time.

## 2015-05-07 NOTE — Progress Notes (Signed)
Name: [name] MRN: [id number] DOB: 1926/07/25    ADMISSION DATE:  04/29/2015 CONSULTATION DATE:  05/02/15  REFERRING MD :  Dr. Olevia Bowens  CHIEF COMPLAINT:  PNA, Effusion   SUBJECTIVE:  C/o discomfort from Lt chest tube.  Not much cough.    VITAL SIGNS: BP 124/52 mmHg  Pulse 77  Temp(Src) 98.9 F (37.2 C) (Oral)  Resp 22  Ht 5\' 6"  (1.676 m)  Wt 161 lb 6 oz (73.2 kg)  BMI 26.06 kg/m2  SpO2 94%  INTAKE/OUTPUT: I/O last 3 completed shifts: In: 1154.8 [P.O.:660; I.V.:194.8; IV Piggyback:300] Out: 470 [Urine:400; Drains:70]   PHYSICAL EXAMINATION: General: pleasant Neuro: normal strength HEENT: no stridor Cardiac: regular, 2/6 SM Chest: decreased BS Lt base, no wheeze, Lt chest tube in place >> 70 ml over last 24 hrs Abd: soft, non tender Ext: no edema Skin no rashes   CMP Latest Ref Rng 05/07/2015 05/04/2015 05/02/2015  Glucose 65 - 99 mg/dL 110(H) 139(H) 153(H)  BUN 6 - 20 mg/dL 17 20 26(H)  Creatinine 0.61 - 1.24 mg/dL 0.87 1.04 1.20  Sodium 135 - 145 mmol/L 137 137 137  Potassium 3.5 - 5.1 mmol/L 4.6 4.0 4.0  Chloride 101 - 111 mmol/L 107 107 108  CO2 22 - 32 mmol/L 22 22 21(L)  Calcium 8.9 - 10.3 mg/dL 7.9(L) 7.8(L) 8.0(L)  Total Protein 6.5 - 8.1 g/dL - - -  Total Bilirubin 0.3 - 1.2 mg/dL - - -  Alkaline Phos 38 - 126 U/L - - -  AST 15 - 41 U/L - - -  ALT 17 - 63 U/L - - -    CBC Latest Ref Rng 05/07/2015 05/06/2015 05/05/2015  WBC 4.0 - 10.5 K/uL 16.2(H) 16.6(H) 15.1(H)  Hemoglobin 13.0 - 17.0 g/dL 9.7(L) 10.8(L) 10.3(L)  Hematocrit 39.0 - 52.0 % 28.2(L) 30.9(L) 29.7(L)  Platelets 150 - 400 K/uL 482(H) 487(H) 445(H)    Dg Chest Port 1 View  05/07/2015  CLINICAL DATA:  Empyema. EXAM: PORTABLE CHEST 1 VIEW COMPARISON:  May 06, 2015. FINDINGS: Stable cardiomediastinal silhouette. Status post aortic valve replacement. No pneumothorax is noted. Right lung is clear. Left-sided pigtail drainage catheter is unchanged in position. Left-sided loculated  pleural effusion is unchanged in size. Stable left basilar opacity is noted concerning for atelectasis or possibly inflammation. IMPRESSION: Stable loculated left pleural effusion. No change in position of left-sided pigtail drainage catheter. Stable left basilar atelectasis or inflammation is noted Electronically Signed   By: Marijo Conception, M.D.   On: 05/07/2015 09:07   Dg Chest Port 1 View  05/06/2015  CLINICAL DATA:  Acute respiratory failure. Smoker. Prosthetic aortic valve. EXAM: PORTABLE CHEST 1 VIEW COMPARISON:  Yesterday. FINDINGS: Mildly decreased left basilar opacity and pleural fluid. A left basilar pigtail catheter remains in place. No pneumothorax is seen. The right lung is clear. The interstitial markings remain mildly prominent. No gross change in size of the heart with obscuration of the left heart borders. Median sternotomy wires and prosthetic aortic valve. Thoracic spine degenerative changes. IMPRESSION: 1. The mildly improved left basilar atelectasis and possible pneumonia. 2. Mildly decreased left pleural fluid. 3. Stable mild chronic interstitial lung disease. Electronically Signed   By: Claudie Revering M.D.   On: 05/06/2015 07:39    STUDIES:  4/10 Lt pleural effusion >> glucose < 20, LDH 300, protein 4.5, WBC 12483 (90%N), cytology negative 4/10 CT chest >> Lt pleural effusion, LLL PNA, Lt sided ATX 4/13 Lt pleural effusion >> glucose <  20, WBC 804 (99%N)  CULTURES:  4/09 Blood >> Coag neg Staph (contaminate) 4/10 Lt pleural fluid >> negative  ANTIBIOTICS:  4/09 Zithromax >> 4/16 4/09 Rocephin >>   SIGNIFICANT EVENTS  4/09  Admit  4/10 Lt thoracentesis by IR >> 1.5 liters removed 4/12 Lt chest tube by IR 4/13 tpa via chest tube 4/14 drained 800 cc 4/15 tpa reinserted  DISCUSSION: 82 yo male former smoker presented to ER with several weeks of malaise and cough.  He was found to have PNA with large Lt pleural effusion with empyema.  He was felt to be too high risk for  VATS, and had chest tube placed by IR.  PMHx of AS s/p AVR, CAD s/p CABG, HTN, HLD, BPH  ASSESSMENT / PLAN:  Lt empyema with CAP >> now with minimal outpt from chest tube. - plan to d/c chest tube 4/17 - f/u CXR after chest tube removed - Abx per primary team   Chesley Mires, MD [place] 05/07/2015, 9:23 AM Pager:  [phone] After 3pm call: [phone]

## 2015-05-07 NOTE — NC FL2 (Signed)
[place] LEVEL OF CARE SCREENING TOOL     IDENTIFICATION  Patient Name: [name] Birthdate: 10-16-1926 Sex: male Admission Date (Current Location): 04/29/2015  [facility] and [place] Number:  Herbalist and Address:  [facility],  [place] [address] [place]      Provider Number: [id number]  Attending Physician Name and Address:  Orson Eva, MD  Relative Name and Phone Number:       Current Level of Care: Hospital Recommended Level of Care: [place] Prior Approval Number:    Date Approved/Denied:   PASRR Number: [id number]  Discharge Plan: SNF    Current Diagnoses: Patient Active Problem List   Diagnosis Date Noted  . Empyema, left ([place]) 05/04/2015  . Empyema ([place])   . CAP (community acquired pneumonia) 04/29/2015  . Sepsis ([place]) 04/29/2015  . Dyspnea 04/29/2015  . Acute kidney injury ([place]) 04/29/2015  . Tobacco abuse 04/29/2015  . Bradycardia 03/08/2015  . Coronary atherosclerosis of native coronary artery 01/25/2013  . Aortic valve disorder 01/25/2013  . Mixed hyperlipidemia 01/25/2013  . Essential hypertension, benign 01/25/2013  . Memory difficulty 10/19/2012    Orientation RESPIRATION BLADDER Height & Weight     Self, Time, Situation, Place  Normal Continent Weight: 161 lb 6 oz (73.2 kg) Height:  5\' 6"  (167.6 cm)  BEHAVIORAL SYMPTOMS/MOOD NEUROLOGICAL BOWEL NUTRITION STATUS   (None)  (None) Continent Diet (Normal Diet Heart Room service appropriate; Fluid consistency: Thin)  AMBULATORY STATUS COMMUNICATION OF NEEDS Skin   Limited Assist Verbally Normal                       Personal Care Assistance Level of Assistance  Bathing, Feeding, Dressing Bathing Assistance: Limited assistance Feeding assistance: Independent Dressing Assistance: Limited assistance     Functional Limitations Info  Sight, Hearing, Speech Sight Info: Adequate Hearing Info: Impaired (HOH - Hearing  aids) Speech Info: Adequate    SPECIAL CARE FACTORS FREQUENCY  PT (By licensed PT)     PT Frequency: 5 x week OT Frequency: 5 x week            Contractures Contractures Info: Not present    Additional Factors Info  Code Status, Allergies Code Status Info: FULL Code Allergies Info: No Known Allergies           Current Medications (05/07/2015):  This is the current hospital active medication list Current Facility-Administered Medications  Medication Dose Route Frequency Provider Last Rate Last Dose  . 0.9 %  sodium chloride infusion   Intravenous Continuous Charlynne Cousins, MD   Stopped at 05/06/15 1529  . amLODipine (NORVASC) tablet 10 mg  10 mg Oral Daily Maryann Mikhail, DO   10 mg at 05/07/15 0930  . aspirin EC tablet 81 mg  81 mg Oral Daily Maryann Mikhail, DO   81 mg at 05/07/15 0930  . dextromethorphan-guaiFENesin (MUCINEX DM) 30-600 MG per 12 hr tablet 2 tablet  2 tablet Oral BID Maryann Mikhail, DO   2 tablet at 05/07/15 0930  . feeding supplement (ENSURE ENLIVE) (ENSURE ENLIVE) liquid 237 mL  237 mL Oral BID BM Charlynne Cousins, MD   237 mL at 05/05/15 1127  . multivitamin with minerals tablet 1 tablet  1 tablet Oral Daily Maryann Mikhail, DO   1 tablet at 05/07/15 0930  . pravastatin (PRAVACHOL) tablet 20 mg  20 mg Oral q1800 Maryann Mikhail, DO   20 mg at 05/06/15 1702  .  QUEtiapine (SEROQUEL) tablet 25 mg  25 mg Oral QHS PRN,MR X 1 Charlynne Cousins, MD      . traMADol Veatrice Bourbon) tablet 50 mg  50 mg Oral Q12H PRN Dianne Dun, NP   50 mg at 05/05/15 1753     Discharge Medications: Please see discharge summary for a list of discharge medications.  Relevant Imaging Results:  Relevant Lab Results:   Additional Information SSN: [id number]  Harlon Flor, Student-SW [phone]

## 2015-05-08 DIAGNOSIS — Z5189 Encounter for other specified aftercare: Secondary | ICD-10-CM | POA: Diagnosis not present

## 2015-05-08 DIAGNOSIS — R41841 Cognitive communication deficit: Secondary | ICD-10-CM | POA: Diagnosis not present

## 2015-05-08 DIAGNOSIS — M6281 Muscle weakness (generalized): Secondary | ICD-10-CM | POA: Diagnosis not present

## 2015-05-08 DIAGNOSIS — Z720 Tobacco use: Secondary | ICD-10-CM | POA: Diagnosis not present

## 2015-05-08 DIAGNOSIS — J869 Pyothorax without fistula: Secondary | ICD-10-CM | POA: Diagnosis not present

## 2015-05-08 DIAGNOSIS — J90 Pleural effusion, not elsewhere classified: Secondary | ICD-10-CM | POA: Diagnosis not present

## 2015-05-08 DIAGNOSIS — R6520 Severe sepsis without septic shock: Secondary | ICD-10-CM | POA: Diagnosis not present

## 2015-05-08 DIAGNOSIS — J948 Other specified pleural conditions: Secondary | ICD-10-CM | POA: Diagnosis not present

## 2015-05-08 DIAGNOSIS — A419 Sepsis, unspecified organism: Secondary | ICD-10-CM | POA: Diagnosis not present

## 2015-05-08 DIAGNOSIS — R262 Difficulty in walking, not elsewhere classified: Secondary | ICD-10-CM | POA: Diagnosis not present

## 2015-05-08 DIAGNOSIS — J9600 Acute respiratory failure, unspecified whether with hypoxia or hypercapnia: Secondary | ICD-10-CM | POA: Diagnosis not present

## 2015-05-08 DIAGNOSIS — R1312 Dysphagia, oropharyngeal phase: Secondary | ICD-10-CM | POA: Diagnosis not present

## 2015-05-08 DIAGNOSIS — J189 Pneumonia, unspecified organism: Secondary | ICD-10-CM | POA: Diagnosis not present

## 2015-05-08 DIAGNOSIS — I359 Nonrheumatic aortic valve disorder, unspecified: Secondary | ICD-10-CM | POA: Diagnosis not present

## 2015-05-08 DIAGNOSIS — I10 Essential (primary) hypertension: Secondary | ICD-10-CM | POA: Diagnosis not present

## 2015-05-08 DIAGNOSIS — N179 Acute kidney failure, unspecified: Secondary | ICD-10-CM | POA: Diagnosis not present

## 2015-05-08 LAB — CBC
HCT: 30.1 % — ABNORMAL LOW (ref 39.0–52.0)
Hemoglobin: 10.3 g/dL — ABNORMAL LOW (ref 13.0–17.0)
MCH: 30.4 pg (ref 26.0–34.0)
MCHC: 34.2 g/dL (ref 30.0–36.0)
MCV: 88.8 fL (ref 78.0–100.0)
PLATELETS: 513 10*3/uL — AB (ref 150–400)
RBC: 3.39 MIL/uL — ABNORMAL LOW (ref 4.22–5.81)
RDW: 13.5 % (ref 11.5–15.5)
WBC: 15.8 10*3/uL — ABNORMAL HIGH (ref 4.0–10.5)

## 2015-05-08 LAB — CBC AND DIFFERENTIAL: WBC: 15.8 10*3/mL

## 2015-05-08 MED ORDER — QUETIAPINE FUMARATE 25 MG PO TABS: 25.0000 mg | ORAL_TABLET | Freq: Every evening | ORAL | Status: DC | PRN

## 2015-05-08 MED ORDER — AMOXICILLIN-POT CLAVULANATE 875-125 MG PO TABS: 1.0000 | ORAL_TABLET | Freq: Two times a day (BID) | ORAL | Status: DC

## 2015-05-08 NOTE — Progress Notes (Cosign Needed)
CSW continuing to follow.   BSW Intern met with pt at bedside to provide bed offers. Pt called wife via telephone in room. BSW Intern discussed bed offers with wife. Pt and pt wife choose bed at [facility].  CSW confirmed bed offer with [facility]   [place] to complete paper work with pt at bedside at 2:00 pm.  CSW submitted and awaiting insurance authorization.  CSW to continue to follow and provide disposition needs when appropriate.  Harlon Flor, [place] Intern Clinical Social Work Department  [phone]

## 2015-05-08 NOTE — Progress Notes (Signed)
Physical Therapy Treatment Patient Details Name: [name] MRN: [id number] DOB: 10/16/1926 Today's Date: May 23, 2015    History of Present Illness Pt is an 82 year old male with hx of memory loss, aortic valve replacement, HTN and admitted with large left pleural effusion and CAP with Empyema    PT Comments    Pt able to progress ambulation distance today.  Pt reports plan to d/c to SNF for rehab.  Follow Up Recommendations  SNF;Supervision/Assistance - 24 hour     Equipment Recommendations  Rolling walker with 5" wheels    Recommendations for Other Services       Precautions / Restrictions Precautions Precautions: Fall Precaution Comments: chest tube removed 4/17    Mobility  Bed Mobility Overal bed mobility: Needs Assistance Bed Mobility: Supine to Sit;Sit to Supine     Supine to sit: Min assist Sit to supine: Min assist   General bed mobility comments: some assist required today for trunk upright and LEs onto bed  Transfers Overall transfer level: Needs assistance Equipment used: Rolling walker (2 wheeled) Transfers: Sit to/from Stand Sit to Stand: Min guard         General transfer comment: verbal cues for safe technique  Ambulation/Gait Ambulation/Gait assistance: Min guard Ambulation Distance (Feet): 120 Feet Assistive device: Rolling walker (2 wheeled) Gait Pattern/deviations: Step-through pattern;Decreased stride length     General Gait Details: decreased speed, verbal cues for use of RW, able to progress distance today although still limited by fatigue   Stairs            Wheelchair Mobility    Modified Rankin (Stroke Patients Only)       Balance                                    Cognition Arousal/Alertness: Awake/alert Behavior During Therapy: WFL for tasks assessed/performed Overall Cognitive Status: Within Functional Limits for tasks assessed                      Exercises      General  Comments        Pertinent Vitals/Pain Pain Assessment: No/denies pain    Home Living                      Prior Function            PT Goals (current goals can now be found in the care plan section) Progress towards PT goals: Progressing toward goals    Frequency  Min 3X/week    PT Plan Current plan remains appropriate    Co-evaluation             End of Session   Activity Tolerance: Patient limited by fatigue Patient left: in bed;with call bell/phone within reach;with bed alarm set     Time: [id number] PT Time Calculation (min) (ACUTE ONLY): 16 min  Charges:  $Gait Training: 8-22 mins                    G Codes:      Makara Lanzo,KATHrine E 05-23-2015, 2:34 PM Carmelia Bake, PT, DPT 05/23/15 Pager: [phone]

## 2015-05-08 NOTE — Progress Notes (Signed)
Name: [name] MRN: [id number] DOB: [birth year]    ADMISSION DATE:  04/29/2015 CONSULTATION DATE:  05/02/15  REFERRING MD :  Dr. Olevia Bowens  CHIEF COMPLAINT:  PNA, Effusion   SUBJECTIVE:  Feels much better with chest tube out.  Denies cough, or chest congestion.  VITAL SIGNS: BP 126/56 mmHg  Pulse 80  Temp(Src) 98.7 F (37.1 C) (Oral)  Resp 22  Ht 5\' 6"  (1.676 m)  Wt 161 lb 6 oz (73.2 kg)  BMI 26.06 kg/m2  SpO2 94%  INTAKE/OUTPUT: I/O last 3 completed shifts: In: 1300 [P.O.:1140; I.V.:160] Out: 1070 [Urine:1050; Drains:20]   PHYSICAL EXAMINATION: General: pleasant Neuro: normal strength HEENT: no stridor Cardiac: regular, 2/6 SM Chest: faint crackles Lt base, no wheeze Abd: soft, non tender Ext: no edema Skin no rashes   CMP Latest Ref Rng 05/07/2015 05/04/2015 05/02/2015  Glucose 65 - 99 mg/dL 110(H) 139(H) 153(H)  BUN 6 - 20 mg/dL 17 20 26(H)  Creatinine 0.61 - 1.24 mg/dL 0.87 1.04 1.20  Sodium 135 - 145 mmol/L 137 137 137  Potassium 3.5 - 5.1 mmol/L 4.6 4.0 4.0  Chloride 101 - 111 mmol/L 107 107 108  CO2 22 - 32 mmol/L 22 22 21(L)  Calcium 8.9 - 10.3 mg/dL 7.9(L) 7.8(L) 8.0(L)  Total Protein 6.5 - 8.1 g/dL - - -  Total Bilirubin 0.3 - 1.2 mg/dL - - -  Alkaline Phos 38 - 126 U/L - - -  AST 15 - 41 U/L - - -  ALT 17 - 63 U/L - - -    CBC Latest Ref Rng 05/08/2015 05/07/2015 05/06/2015  WBC 4.0 - 10.5 K/uL 15.8(H) 16.2(H) 16.6(H)  Hemoglobin 13.0 - 17.0 g/dL 10.3(L) 9.7(L) 10.8(L)  Hematocrit 39.0 - 52.0 % 30.1(L) 28.2(L) 30.9(L)  Platelets 150 - 400 K/uL 513(H) 482(H) 487(H)    Dg Chest Port 1 View  05/07/2015  CLINICAL DATA:  Left effusion.  Chest tube removal. EXAM: PORTABLE CHEST 1 VIEW 11:18 a.m. COMPARISON:  05/07/2015 at 8:53 a.m. and 05/06/2015 and 05/05/2015 and 04/29/2015 and chest CT dated 04/30/2015 FINDINGS: Left chest tube is been removed. No pneumothorax. Slight residual pleural thickening or loculated fluid laterally and inferiorly at the  left base. Improving focal compressive atelectasis in the left lower lobe. Right lung is clear. Heart size and vascularity are normal. Calcification in the thoracic aorta. Aortic valve replacement. IMPRESSION: No pneumothorax after chest tube removal. Slight residual pleural thickening and/or loculated pleural fluid and atelectasis at the left lung base. Electronically Signed   By: Lorriane Shire M.D.   On: 05/07/2015 11:46   Dg Chest Port 1 View  05/07/2015  CLINICAL DATA:  Empyema. EXAM: PORTABLE CHEST 1 VIEW COMPARISON:  May 06, 2015. FINDINGS: Stable cardiomediastinal silhouette. Status post aortic valve replacement. No pneumothorax is noted. Right lung is clear. Left-sided pigtail drainage catheter is unchanged in position. Left-sided loculated pleural effusion is unchanged in size. Stable left basilar opacity is noted concerning for atelectasis or possibly inflammation. IMPRESSION: Stable loculated left pleural effusion. No change in position of left-sided pigtail drainage catheter. Stable left basilar atelectasis or inflammation is noted Electronically Signed   By: Marijo Conception, M.D.   On: 05/07/2015 09:07    STUDIES:  4/10 Lt pleural effusion >> glucose < 20, LDH 300, protein 4.5, WBC 12483 (90%N), cytology negative 4/10 CT chest >> Lt pleural effusion, LLL PNA, Lt sided ATX 4/13 Lt pleural effusion >> glucose < 20, WBC 804 (99%N)  CULTURES:  4/09  Blood >> Coag neg Staph (contaminate) 4/10 Lt pleural fluid >> negative  ANTIBIOTICS:  4/09 Zithromax >> 4/16 4/09 Rocephin >> 4/16 4/17 Augmentin  SIGNIFICANT EVENTS  4/09  Admit  4/10 Lt thoracentesis by IR >> 1.5 liters removed 4/12 Lt chest tube by IR 4/13 tpa via chest tube 4/14 drained 800 cc 4/15 tpa reinserted 4/17 Lt chest tube d/c'ed  DISCUSSION: 82 yo male former smoker presented to ER with several weeks of malaise and cough.  He was found to have PNA with large Lt pleural effusion with empyema.  He was felt to be too  high risk for VATS, and had chest tube placed by IR.  PMHx of AS s/p AVR, CAD s/p CABG, HTN, HLD, BPH  ASSESSMENT / PLAN:  Lt empyema with CAP. - would continue oral Abx for at least 1 more week - will schedule pulmonary office follow up with CXR in one week  Okay for d/c from pulmonary standpoint.  Please call if additional help needed while he is in hospital.  I have scheduled him for follow up with Noe Gens at 1130 am on Monday 05/14/15.   Chesley Mires, MD [facility] Pulmonary/Critical Care 05/08/2015, 9:03 AM Pager:  [phone] After 3pm call: [phone]

## 2015-05-08 NOTE — Progress Notes (Signed)
BSW Intern received notification from [place] that paperwork for [facility] has been completed.  BSW Intern facilitated discharge including assembling dc packet, arranging nonemergency transport to facility, providing RN number to call for report, and notifiying pt wife.  No further SW needs identified at this time.  BSW Intern signing off, Harlon Flor, [place] Work Department  [phone]

## 2015-05-08 NOTE — Progress Notes (Signed)
CSW received authorization from [facility] for pt to admit to [facility] today, [place] #: [id number].   CSW to facilitate pt discharge needs this afternoon once admission paperwork completed.  Alison Murray, MSW, [place] Work [phone]

## 2015-05-08 NOTE — Progress Notes (Signed)
Nutrition Follow-up  DOCUMENTATION CODES:   Severe malnutrition in context of chronic illness  INTERVENTION:  - Continue Ensure Enlive BID   NUTRITION DIAGNOSIS:   Malnutrition related to chronic illness as evidenced by moderate depletion of body fat, severe depletion of muscle mass. -ongoing  GOAL:   Patient will meet greater than or equal to 90% of their needs -met with current intakes   MONITOR:   PO intake, I & O's, Labs, Supplement acceptance  ASSESSMENT:   With a history of hypertension, hyperlipidemia, aortic stenosis without repair, that presented to the emergency department with complaints of shortness of breath, and cough along with generalized weakness. Symptoms started a couple weeks ago however resolved after a few days. One week later, patient started to have cough and feeling weak  Since previous RD assessment on 4/13, pt has been consuming 75% of meals on average (35-100%). Per MD note 4/17 and rounds this AM, plan to d/c to SNF today.   Pt meeting needs at this time. Medications reviewed; daily multivitamin with minerals. Labs reviewed; Ca: 7.9 mg/dL  Diet Order:  Diet Heart Room service appropriate?: Yes; Fluid consistency:: Thin  Skin:  Wound (see comment) (MSAD bilateral buttocks)  Last BM:  4/17  Height:   Ht Readings from Last 1 Encounters:  04/29/15 _0  (1.676 m)    Weight:   Wt Readings from Last 1 Encounters:  04/29/15 161 lb 6 oz (73.2 kg)    Ideal Body Weight:  75.45 kg  BMI:  Body mass index is 26.06 kg/(m^2).  Estimated Nutritional Needs:   Kcal:  1800-2100  Protein:  75-90 grams  Fluid:  >/= 1.8L  EDUCATION NEEDS:   No education needs identified at this time     Jarome Matin, RD, LDN Inpatient Clinical Dietitian Pager # [phone] After hours/weekend pager # [phone]

## 2015-05-08 NOTE — Care Management Important Message (Signed)
Important Message  Patient Details  Name: BALLARD THIBODEAUX MRN: [id number] Date of Birth: 11-26-1926   Medicare Important Message Given:  Yes    Camillo Flaming 05/08/2015, 9:25 AMImportant Message  Patient Details  Name: KALUP KLIMAS MRN: [id number] Date of Birth: [birth year]   Medicare Important Message Given:  Yes    Camillo Flaming 05/08/2015, 9:25 AM

## 2015-05-08 NOTE — Clinical Social Work Placement (Signed)
   CLINICAL SOCIAL WORK PLACEMENT  NOTE  Date:  05/08/2015  Patient Details  Name: [name] MRN: [id number] Date of Birth: Sep 23, 1926  Clinical Social Work is seeking post-discharge placement for this patient at the [place] level of care (*CSW will initial, date and re-position this form in  chart as items are completed):  Yes   Patient/family provided with [place] Work Department's list of facilities offering this level of care within the geographic area requested by the patient (or if unable, by the patient's family).  Yes   Patient/family informed of their freedom to choose among providers that offer the needed level of care, that participate in Medicare, Medicaid or managed care program needed by the patient, have an available bed and are willing to accept the patient.  Yes   Patient/family informed of [place]'s ownership interest in [facility] and [facility], as well as of the fact that they are under no obligation to receive care at these facilities.  PASRR submitted to EDS on 05/07/15     PASRR number received on 05/07/15     Existing PASRR number confirmed on       FL2 transmitted to all facilities in geographic area requested by pt/family on       FL2 transmitted to all facilities within larger geographic area on 05/07/15     Patient informed that his/her managed care company has contracts with or will negotiate with certain facilities, including the following:        Yes   Patient/family informed of bed offers received.  Patient chooses bed at [facility]     Physician recommends and patient chooses bed at      Patient to be transferred to [facility] on 05/08/15.  Patient to be transferred to facility by ambulance Corey Harold)     Patient family notified on 05/08/15 of transfer.  Name of family member notified:  pt and pt wife ms. Carattini      PHYSICIAN       Additional Comment:     _______________________________________________ Ladell Pier, LCSW 05/08/2015, 4:34 PM

## 2015-05-09 ENCOUNTER — Non-Acute Institutional Stay (SKILLED_NURSING_FACILITY): Payer: PPO | Admitting: Internal Medicine

## 2015-05-09 ENCOUNTER — Encounter: Payer: Self-pay | Admitting: Internal Medicine

## 2015-05-09 DIAGNOSIS — I10 Essential (primary) hypertension: Secondary | ICD-10-CM | POA: Diagnosis not present

## 2015-05-09 DIAGNOSIS — I2510 Atherosclerotic heart disease of native coronary artery without angina pectoris: Secondary | ICD-10-CM | POA: Diagnosis not present

## 2015-05-09 DIAGNOSIS — D638 Anemia in other chronic diseases classified elsewhere: Secondary | ICD-10-CM | POA: Diagnosis not present

## 2015-05-09 DIAGNOSIS — J189 Pneumonia, unspecified organism: Secondary | ICD-10-CM | POA: Diagnosis not present

## 2015-05-09 DIAGNOSIS — N183 Chronic kidney disease, stage 3 unspecified: Secondary | ICD-10-CM

## 2015-05-09 DIAGNOSIS — J869 Pyothorax without fistula: Secondary | ICD-10-CM | POA: Diagnosis not present

## 2015-05-09 DIAGNOSIS — R5381 Other malaise: Secondary | ICD-10-CM

## 2015-05-09 DIAGNOSIS — D72829 Elevated white blood cell count, unspecified: Secondary | ICD-10-CM | POA: Diagnosis not present

## 2015-05-09 NOTE — Progress Notes (Signed)
LOCATION: [facility]  PCP: Irven Shelling, MD   Code Status: Full Code  Goals of care: Advanced Directive information Advanced Directives 04/29/2015  Does patient have an advance directive? Yes  Type of Advance Directive Out of facility DNR (pink MOST or yellow form)  Would patient like information on creating an advanced directive? -       Extended Emergency Contact Information Primary Emergency Contact: Chenard,Freddie Address: [address]          [place], [place] [address] [place] Phone: [phone] Relation: Spouse   No Known Allergies  Chief Complaint  Patient presents with  . New Admit To SNF    New Admission     HPI:  Patient is a 82 y.o. male seen today for short term rehabilitation post hospital admission from 04/29/15-05/08/15 with sepsis from community acquired pneumonia. He was started on antibiotics. He was found to have large left sided lung empyema with loculation. He underwent thoracocentesis and chest tube placement. He is now on antibiotics for another 4 weeks. He had acute on chronic renal failure and received iv fluids. He has PMH of CAD, AS s/p AVR, HTN among others. He is seen in his room today.   Review of Systems:  Constitutional: Negative for fever, chills, diaphoresis. Positive for malaise.  HENT: Negative for headache, congestion, nasal discharge, hearing loss, sore throat, difficulty swallowing.   Eyes: Negative for blurred vision, double vision and discharge.  Respiratory: Negative for shortness of breath and wheezing. Breathing has improved. Positive for dry cough.   Cardiovascular: Negative for chest pain, palpitations, leg swelling.  Gastrointestinal: Negative for heartburn, nausea, vomiting, abdominal pain, loss of appetite. Last bowel movement was yesterday.  Genitourinary: Negative for dysuria and flank pain.  Musculoskeletal: Negative for back pain, fall in the facility.  Skin: Negative for itching, rash.    Neurological: positive for weakness. Positive for occasional dizziness with change of position. Psychiatric/Behavioral: Negative for depression    Past Medical History  Diagnosis Date  . Severe aortic stenosis 2010    S/P AVR-TISSUE VALVE  . Coronary artery disease with hx of myocardial infarct w/o hx of CABG 2010  . Hypertension   . Hyperlipidemia   . BPH (benign prostatic hyperplasia)   . Impaired fasting glucose   . Diverticulosis   . 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 Memory loss   . Retinal tear   . Memory difficulty 10/19/2012  . HOH (hard of hearing)     hearing aids   Past Surgical History  Procedure Laterality Date  . Tonsillectomy    . Aortic valve replacement    . 2 vessel cabg    . [place]    . Cataract extraction, bilateral      RT 2010 LT 2014   Social History:   reports that he has been smoking Pipe.  He has never used smokeless tobacco. He reports that he does not drink alcohol or use illicit drugs.  Family History  Problem Relation Age of Onset  . Stroke Father   . Cancer Brother   . Heart attack Neg Hx   . Hypertension Brother     Medications:   Medication List       This list is accurate as of: 05/09/15 12:08 PM.  Always use your most recent med list.               amLODipine 10 MG tablet  Commonly known as:  NORVASC  Take 1  tablet (10 mg total) by mouth daily.     amoxicillin-clavulanate 875-125 MG tablet  Commonly known as:  AUGMENTIN  Take 1 tablet by mouth every 12 (twelve) hours.     aspirin EC 81 MG tablet  Take 81 mg by mouth daily.     multivitamin with minerals tablet  Take 1 tablet by mouth daily.     pravastatin 20 MG tablet  Commonly known as:  PRAVACHOL  TAKE 1 TABLET BY MOUTH EVERY DAY     QUEtiapine 25 MG tablet  Commonly known as:  SEROQUEL  Take 1 tablet (25 mg total) by mouth at bedtime as needed and may repeat dose one time if needed (at bedtime prn).        Immunizations:  There is no immunization  history on file for this patient.   Physical Exam: Filed Vitals:   05/09/15 1158  BP: 127/61  Pulse: 72  Temp: 97.9 F (36.6 C)  TempSrc: Oral  Resp: 18  Height: 5\' 6"  (1.676 m)  Weight: 161 lb (73.029 kg)  SpO2: 91%   Body mass index is 26 kg/(m^2).  General- elderly male, thin built, in no acute distress Head- normocephalic, atraumatic Nose- no maxillary or frontal sinus tenderness, no nasal discharge Throat- moist mucus membrane, missing teeth  Eyes- PERRLA, EOMI, no pallor, no icterus, no discharge, normal conjunctiva, normal sclera Neck- no cervical lymphadenopathy Cardiovascular- normal s1,s2, no murmur, noleg edema Respiratory- bilateral clear to auscultation, no wheeze, no rhonchi, no crackles, no use of accessory muscles, short of breath in between sentences Abdomen- bowel sounds present, soft, non tender Musculoskeletal- able to move all 4 extremities, generalized weakness, dorsalis pedis palpable Neurological- alert and oriented to person, place and time Skin- warm and dry, left chest wall has clean and dry dressing Psychiatry- normal mood and affect    Labs reviewed: Basic Metabolic Panel:  Recent Labs  05/02/15 0442 05/04/15 0454 05/07/15 05/07/15 0433  NA 137 137 137 137  K 4.0 4.0  --  4.6  CL 108 107  --  107  CO2 21* 22  --  22  GLUCOSE 153* 139*  --  110*  BUN 26* 20 17 17   CREATININE 1.20 1.04 0.9 0.87  CALCIUM 8.0* 7.8*  --  7.9*   Liver Function Tests:  Recent Labs  04/29/15 1429  AST 25  ALT 24  ALKPHOS 92  BILITOT 1.2  PROT 7.4  ALBUMIN 2.9*   No results for input(s): LIPASE, AMYLASE in the last 8760 hours. No results for input(s): AMMONIA in the last 8760 hours. CBC:  Recent Labs  04/29/15 1429  05/06/15 0529 05/07/15 0433 05/08/15 05/08/15 0444  WBC 30.7*  < > 16.6* 16.2* 15.8 15.8*  NEUTROABS 28.1*  --   --   --   --   --   HGB 11.1*  < > 10.8* 9.7*  --  10.3*  HCT 32.1*  < > 30.9* 28.2*  --  30.1*  MCV 87.5  < >  86.6 88.7  --  88.8  PLT 457*  < > 487* 482*  --  513*  < > = values in this interval not displayed. Cardiac Enzymes: No results for input(s): CKTOTAL, CKMB, CKMBINDEX, TROPONINI in the last 8760 hours. BNP: Invalid input(s): POCBNP CBG: No results for input(s): GLUCAP in the last 8760 hours.  Radiological Exams: Dg Chest 1 View  04/30/2015  CLINICAL DATA:  Left thoracentesis EXAM: CHEST 1 VIEW COMPARISON:  04/29/2015 FINDINGS: Moderately large  left effusion, decreased in size from yesterday. No pneumothorax. Left lower lobe collapse Right lung clear IMPRESSION: Moderately large left effusion remains following thoracentesis. No pneumothorax. Electronically Signed   By: Franchot Gallo M.D.   On: 04/30/2015 12:48   Dg Chest 2 View  04/29/2015  CLINICAL DATA:  82 year old with recent onset of cough and delays. EXAM: CHEST  2 VIEW COMPARISON:  11/05/2013 and earlier. FINDINGS: Prior sternotomy for aortic valve replacement and CABG. Cardiac silhouette likely normal in size, partially obscured by a large left pleural effusion. Associated dense consolidation in the left lower lobe and left upper lobe. Right lung clear. Pulmonary vascularity normal. No right pleural effusion. Degenerative changes and DISH involving the thoracic spine. IMPRESSION: Large left pleural effusion and associated dense passive atelectasis and/or pneumonia in the left lower lobe and left upper lobe. Electronically Signed   By: Evangeline Dakin M.D.   On: 04/29/2015 14:53   Ct Chest Wo Contrast  04/30/2015  CLINICAL DATA:  Shortness of breath and cough with generalized weakness beginning several weeks ago. Left pleural effusion on current chest radiograph. EXAM: CT CHEST WITHOUT CONTRAST TECHNIQUE: Multidetector CT imaging of the chest was performed following the standard protocol without IV contrast. COMPARISON:  Chest radiograph, 04/30/2015. FINDINGS: Neck base and axilla:  No mass or adenopathy. Mediastinum and hila: Heart is  normal in size. There changes from cardiac surgery and aortic valve replacement. Coronary artery and aortic atherosclerotic calcifications are noted. No mediastinal or hilar masses or enlarged lymph nodes. Lungs and pleura: Large left pleural effusion. There is significant atelectasis of the left lower lobe. There is atelectasis of a portion of the left upper lobe lingula with some minor dependent subsegmental atelectasis in the posterior left upper lobe. A component of left lung base pneumonia is possible. Minimal right pleural effusion with mild dependent right lower lobe subsegmental atelectasis. No convincing pneumonia and no pulmonary edema. No pneumothorax. Limited upper abdomen:  No acute findings. Musculoskeletal: Degenerative changes throughout the visualized spine. Bones demineralized. No osteoblastic or osteolytic lesions. IMPRESSION: 1. Large left pleural effusion with significant left lower lobe and left upper lobe lingula atelectasis. Consider pneumonia as a component of the parenchymal opacity given the patient's history. 2. Minimal right pleural effusion with associated dependent right lower lobe subsegmental atelectasis. 3. No pulmonary edema. Electronically Signed   By: Lajean Manes M.D.   On: 04/30/2015 15:28   Dg Chest Port 1 View  05/07/2015  CLINICAL DATA:  Left effusion.  Chest tube removal. EXAM: PORTABLE CHEST 1 VIEW 11:18 a.m. COMPARISON:  05/07/2015 at 8:53 a.m. and 05/06/2015 and 05/05/2015 and 04/29/2015 and chest CT dated 04/30/2015 FINDINGS: Left chest tube is been removed. No pneumothorax. Slight residual pleural thickening or loculated fluid laterally and inferiorly at the left base. Improving focal compressive atelectasis in the left lower lobe. Right lung is clear. Heart size and vascularity are normal. Calcification in the thoracic aorta. Aortic valve replacement. IMPRESSION: No pneumothorax after chest tube removal. Slight residual pleural thickening and/or loculated pleural  fluid and atelectasis at the left lung base. Electronically Signed   By: Lorriane Shire M.D.   On: 05/07/2015 11:46   Dg Chest Port 1 View  05/07/2015  CLINICAL DATA:  Empyema. EXAM: PORTABLE CHEST 1 VIEW COMPARISON:  May 06, 2015. FINDINGS: Stable cardiomediastinal silhouette. Status post aortic valve replacement. No pneumothorax is noted. Right lung is clear. Left-sided pigtail drainage catheter is unchanged in position. Left-sided loculated pleural effusion is unchanged in size. Stable  left basilar opacity is noted concerning for atelectasis or possibly inflammation. IMPRESSION: Stable loculated left pleural effusion. No change in position of left-sided pigtail drainage catheter. Stable left basilar atelectasis or inflammation is noted Electronically Signed   By: Marijo Conception, M.D.   On: 05/07/2015 09:07   Dg Chest Port 1 View  05/06/2015  CLINICAL DATA:  Acute respiratory failure. Smoker. Prosthetic aortic valve. EXAM: PORTABLE CHEST 1 VIEW COMPARISON:  Yesterday. FINDINGS: Mildly decreased left basilar opacity and pleural fluid. A left basilar pigtail catheter remains in place. No pneumothorax is seen. The right lung is clear. The interstitial markings remain mildly prominent. No gross change in size of the heart with obscuration of the left heart borders. Median sternotomy wires and prosthetic aortic valve. Thoracic spine degenerative changes. IMPRESSION: 1. The mildly improved left basilar atelectasis and possible pneumonia. 2. Mildly decreased left pleural fluid. 3. Stable mild chronic interstitial lung disease. Electronically Signed   By: Claudie Revering M.D.   On: 05/06/2015 07:39   Dg Chest Port 1 View  05/05/2015  CLINICAL DATA:  82 year old male with history of left-sided empyema. History of severe aortic stenosis status post aortic valve replacement. EXAM: PORTABLE CHEST 1 VIEW COMPARISON:  Chest x-ray 05/04/2015. FINDINGS: Left-sided pigtail drainage catheter remains in the left hemithorax,  with the tip projecting over the mid left hemithorax. There continues to be left pleural fluid with irregular contours, indicative of a loculated pleural effusion. Opacities throughout the left mid to lower lung likely reflect associated areas of passive atelectasis and/or airspace consolidation. Right lung is clear. No right pleural effusion. No evidence of pulmonary edema. Heart size appears mildly enlarged (unchanged). Upper mediastinal contours are within normal limits. Atherosclerosis in the thoracic aorta. Status post median sternotomy for CABG and aortic valve replacement (a stented bio prosthesis is noted). IMPRESSION: 1. Support apparatus and postoperative changes, as above. 2. Persistent moderate loculated left pleural fluid collection compatible with the reported history of empyema. This is associated with extensive areas of atelectasis and/or airspace consolidation throughout the left mid to lower lung. The overall appearance the chest is very similar to the prior examination from 05/04/2015. Electronically Signed   By: Vinnie Langton M.D.   On: 05/05/2015 09:27   Dg Chest Port 1 View  05/04/2015  CLINICAL DATA:  Chest tube. EXAM: PORTABLE CHEST 1 VIEW COMPARISON:  05/03/2015.  CT 04/30/2015 . FINDINGS: Left chest tube in stable position. Pleural fluid collection on the left again noted. Slight decrease in size. Loculation may be present. Left lower lobe atelectasis and or infiltrate . No prominent pneumothorax. Mild pleural air collection along the left base of the along the chest tube courses cannot be entirely excluded . Right lung is clear. Prior CABG and cardiac valve replacement. Stable cardiomegaly. No pulmonary venous congestion. IMPRESSION: 1. Left chest tube in stable position. Left pleural fluid collection has decreased in size. A component of loculation may be present. Left lower lobe atelectasis and/or infiltrate. Small amount of pleural air along the left base along the course of the  left chest tube cannot be excluded. Continued follow-up exam suggested. 2.  Prior CABG.  Cardiac valve replacement.  Stable cardiomegaly. Critical Value/emergent results were called by telephone at the time of interpretation on 05/04/2015 at 7:19 am to nurse Lenell Antu , who verbally acknowledged these results. Electronically Signed   By: Marcello Moores  Register   On: 05/04/2015 07:22   Dg Chest Port 1 View  05/03/2015  CLINICAL DATA:  Cough, left  effusion EXAM: PORTABLE CHEST 1 VIEW COMPARISON:  CT chest of 04/30/2015, and chest x-ray of 04/30/2015 FINDINGS: The left chest tube is present and there has been a slight decrease in volume of the left pleural effusion with probable left basilar atelectasis. The right lung is clear. Cardiomegaly is stable and median sternotomy sutures are noted with aortic valve replacement present. IMPRESSION: 1. Left chest tube present with slight decrease in volume of the left pleural effusion. 2. Stable cardiomegaly. Electronically Signed   By: Ivar Drape M.D.   On: 05/03/2015 08:13   Ct Image Guided Drainage By Percutaneous Catheter  05/02/2015  CLINICAL DATA:  Left lung pneumonia with large left pleural empyema. Prior thoracentesis on 04/30/2015 demonstrated fluid consistent with empyema. The patient is not a candidate currently for surgical intervention with VATS. Request has been made to place a percutaneous drainage catheter into the left empyema. EXAM: CT GUIDED DRAINAGE OF LEFT PLEURAL EMPYEMA ANESTHESIA/SEDATION: 0.5 Mg IV Versed 25 mcg IV Fentanyl Total Moderate Sedation Time:  20 minutes. The patient's level of consciousness and physiologic status were continuously monitored during the procedure by Radiology nursing. PROCEDURE: The procedure, risks, benefits, and alternatives were explained to the patient. Questions regarding the procedure were encouraged and answered. The patient understands and consents to the procedure. A time-out was performed prior to initiating the  procedure. The left chest wall was prepped with chlorhexidine in a sterile fashion, and a sterile drape was applied covering the operative field. A sterile gown and sterile gloves were used for the procedure. Local anesthesia was provided with 1% Lidocaine. CT was performed in a supine position. After localizing the left pleural collection, an 18 gauge trocar needle was advanced into the left pleural space from an anterior approach. After return of fluid, a guidewire was advanced through the needle. The tract was dilated over the wire and a 14 French pigtail drainage catheter advanced. Catheter positioning was confirmed by CT. The catheter was connected to a [place] Pleur-Evac device. It was secured at the skin with a Prolene retention suture, StatLock device and overlying dressing. COMPLICATIONS: None FINDINGS: Initial CT shows significant reaccumulation of a large left pleural effusion which is loculated and has been demonstrated to be consistent with empyema after prior sampling. There is significant consolidation of the left lung. Fluid return demonstrates mildly turbid yellowish fluid from the left pleural space. A 14 French pigtail catheter was placed and is draining well after placement. IMPRESSION: CT-guided left pleural drainage catheter placement to treat empyema. A 14 French pigtail drainage catheter was placed. The catheter was connected to a [place] Pleur-Evac device and will be connected to wall suction at -20 cm of water. Electronically Signed   By: Aletta Edouard M.D.   On: 05/02/2015 16:44   US Thoracentesis Asp Pleural Space W/img Guide  04/30/2015  INDICATION: 82 year old male who was admitted through the emergency department with sepsis secondary to community acquired pneumonia. The patient was found have a large left-sided pleural effusion on chest x-ray. Request has been made for a therapeutic and diagnostic thoracentesis. EXAM: ULTRASOUND GUIDED DIAGNOSTIC AND THERAPEUTIC THORACENTESIS  MEDICATIONS: 1% lidocaine COMPLICATIONS: None immediate. PROCEDURE: An ultrasound guided thoracentesis was thoroughly discussed with the patient and questions answered. The benefits, risks, alternatives and complications were also discussed. The patient understands and wishes to proceed with the procedure. Written consent was obtained. Ultrasound was performed to localize and mark an adequate pocket of fluid in the left chest. The area was then prepped and draped in  the normal sterile fashion. 1% Lidocaine was used for local anesthesia. Under ultrasound guidance a Safe-T-Centesis catheter was introduced. Thoracentesis was performed. The procedure was stopped after 1-1/2 L of fluid was obtained. This is standard procedure for a first thoracentesis and the patient had an elevated creatinine as well at 1.56. The catheter was removed and a dressing applied. FINDINGS: A total of approximately 1.5 L of cloudy amber fluid was removed. Multiple loculations were noted on ultrasound. After fluid was removed, the final image revealed that a significant portion of the effusion was removed with this procedure. Samples were sent to the laboratory as requested by the clinical team. IMPRESSION: Successful ultrasound guided left thoracentesis yielding 1.5 L of pleural fluid. Read by: Saverio Danker, PA-C Electronically Signed   By: Jacqulynn Cadet M.D.   On: 04/30/2015 12:46    Assessment/Plan  Physical deconditioning With generalized weakness. Will have him work with physical therapy and occupational therapy team to help with gait training and muscle strengthening exercises.fall precautions. Skin care. Encourage to be out of bed.   CAP Continue and complete his course of antibiotics, pulmonary toileting  Left lung empyema Continue and complete course of augmentin in 4 weeks. Check cbc with diff and bmp weekly. Get chest xray in 1 week  Leukocytosis With his pneumonia and empyema, monitor wbc and temp curve  Anemia   Check cbc  ckd stage 3 Monitor bmp given his acute renal failure in hospital  HTN Stable, check bp daily, continue amlodipine  CAD Chest pain free. contineu aspirin and statin    Goals of care: short term rehabilitation   Labs/tests ordered: cbc, cmp once a week for 4 weeks  Family/ staff Communication: reviewed care plan with patient and nursing supervisor    Blanchie Serve, MD Internal Medicine [place], [place] [address] Cell Phone (Monday-Friday 8 am - 5 pm): [phone] On Call: [phone] and follow prompts after 5 pm and on weekends Office Phone: [phone] Office Fax: [phone]

## 2015-05-10 DIAGNOSIS — I10 Essential (primary) hypertension: Secondary | ICD-10-CM | POA: Diagnosis not present

## 2015-05-10 DIAGNOSIS — D500 Iron deficiency anemia secondary to blood loss (chronic): Secondary | ICD-10-CM | POA: Diagnosis not present

## 2015-05-10 LAB — CBC AND DIFFERENTIAL
HCT: 30 % — AB (ref 41–53)
HEMATOCRIT: 30 % — AB (ref 41–53)
Hemoglobin: 9.5 g/dL — AB (ref 13.5–17.5)
Hemoglobin: 9.9 g/dL — AB (ref 13.5–17.5)
PLATELETS: 602 10*3/uL — AB (ref 150–399)
Platelets: 555 10*3/uL — AB (ref 150–399)
WBC: 11.5 10*3/mL
WBC: 16.2 10*3/mL

## 2015-05-10 LAB — BASIC METABOLIC PANEL
BUN: 16 mg/dL (ref 4–21)
Creatinine: 1 mg/dL (ref 0.6–1.3)
GLUCOSE: 103 mg/dL
Potassium: 4.5 mmol/L (ref 3.4–5.3)
SODIUM: 137 mmol/L (ref 137–147)

## 2015-05-10 LAB — HEPATIC FUNCTION PANEL
ALT: 73 U/L — AB (ref 10–40)
AST: 45 U/L — AB (ref 14–40)
Alkaline Phosphatase: 133 U/L — AB (ref 25–125)
BILIRUBIN, TOTAL: 0.6 mg/dL

## 2015-05-11 ENCOUNTER — Encounter: Payer: Self-pay | Admitting: Adult Health

## 2015-05-11 NOTE — Progress Notes (Signed)
Patient ID: JUA KIKER, male   DOB: 1926/06/06, 82 y.o.   MRN: [id number]  This encounter was created in error - please disregard. This encounter was created in error - please disregard.

## 2015-05-14 ENCOUNTER — Ambulatory Visit (INDEPENDENT_AMBULATORY_CARE_PROVIDER_SITE_OTHER)
Admission: RE | Admit: 2015-05-14 | Discharge: 2015-05-14 | Disposition: A | Discharge status: Home / Self Care | Payer: PPO | Source: Ambulatory Visit | Attending: Pulmonary Disease | Admitting: Pulmonary Disease

## 2015-05-14 ENCOUNTER — Ambulatory Visit (INDEPENDENT_AMBULATORY_CARE_PROVIDER_SITE_OTHER): Payer: PPO | Admitting: Pulmonary Disease

## 2015-05-14 ENCOUNTER — Inpatient Hospital Stay: Payer: PPO | Admitting: Pulmonary Disease

## 2015-05-14 ENCOUNTER — Encounter: Payer: Self-pay | Admitting: Pulmonary Disease

## 2015-05-14 VITALS — BP 128/62 | HR 80 | Ht 67.0 in | Wt 152.8 lb

## 2015-05-14 DIAGNOSIS — J189 Pneumonia, unspecified organism: Secondary | ICD-10-CM

## 2015-05-14 DIAGNOSIS — J869 Pyothorax without fistula: Secondary | ICD-10-CM

## 2015-05-14 DIAGNOSIS — D500 Iron deficiency anemia secondary to blood loss (chronic): Secondary | ICD-10-CM | POA: Diagnosis not present

## 2015-05-14 LAB — CBC AND DIFFERENTIAL
HCT: 30 % — AB (ref 41–53)
HEMOGLOBIN: 9.9 g/dL — AB (ref 13.5–17.5)
Platelets: 602 10*3/uL — AB (ref 150–399)
WBC: 11.5 10^3/mL

## 2015-05-14 NOTE — Progress Notes (Signed)
Current Outpatient Prescriptions on File Prior to Visit  Medication Sig  . amLODipine (NORVASC) 10 MG tablet Take 1 tablet (10 mg total) by mouth daily.  Marland Kitchen amoxicillin-clavulanate (AUGMENTIN) 875-125 MG tablet Take 1 tablet by mouth every 12 (twelve) hours.  Marland Kitchen aspirin EC 81 MG tablet Take 81 mg by mouth daily.  Marland Kitchen atorvastatin (LIPITOR) 10 MG tablet Take 10 mg by mouth daily.  . Multiple Vitamins-Minerals (MULTIVITAMIN WITH MINERALS) tablet Take 1 tablet by mouth daily.  . QUEtiapine (SEROQUEL) 25 MG tablet Take 1 tablet (25 mg total) by mouth at bedtime as needed and may repeat dose one time if needed (at bedtime prn).   No current facility-administered medications on file prior to visit.     Chief Complaint  Patient presents with  . Hospitalization Follow-up    HFU per VS -Recent admit to [facility] - currently at [facility]. Pt denies any current breathing issues since d/c.      Tests 4/24  CXR (2V) >> images personally reviewed, no significant change from previous.    IMPRESSION: Similar small left pleural effusion with left base airspace disease.  Air-fluid level on the lateral view is favored to be within the stomach. Small loculated left-sided hydropneumothorax cannot be excluded. Recommend attention on follow-up.    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   Past surgical hx, Allergies, Family hx, Social hx all reviewed.  Vital Signs BP 128/62 mmHg  Pulse 80  Ht 5\' 7"  (1.702 m)  Wt 152 lb 12.8 oz (69.31 kg)  BMI 23.93 kg/m2  SpO2 97%  History of Present Illness [name] is a 82 y.o. male with CAD s/p MI with CABG, HTN, HLD,  severe aortic stenosis, BPH, diverticulosis, dementia (mild) and hard of hearing who presents for hospital follow-up 4/24 after admission for CAP with empyema.    Hospital course review shows he was treated with IV antibiotics and transitioned to Augmentin on 4/17 with planned 28 days total antibiotics.  The patient reports feeling much better post discharge. He has been at [facility] for rehabilitation efforts.  He reports minimal cough with no significant sputum production, minimal pain at the site of chest tube insertion. He denies fevers, chills, shortness of breath and chest pain.   Physical Exam  General - well developed adult in no acute distress ENT - No sinus tenderness, no oral exudate, no LAN Cardiac - s1s2 regular, no murmur Chest - even/non-labored, lungs bilaterally clear, diminished LLL. No wheeze/rales.  Old chest tube site well healed, clean /dry.  Back - No focal tenderness Abd - Soft, non-tender Ext - No edema Neuro - Normal strength Skin - No rashes Psych - normal mood, and behavior   Assessment/Plan  Community Acquired PNA with Empyema - resolving.  Anticipate that his chest x-ray will likely have some scarring/abnormal appearance of left lower due to empyema and instrumentation with chest tube.  He may have a residual amount of fluid in the left pleural space. He was deemed not a candidate for VATS while inpatient. Conservative management attempted.  Plan: Complete antibiotics as prescribed for empyema Call for new or worsening symptoms Recommended that he have a follow-up chest x-ray in 2-3 weeks after completion of  antibiotics to establish his "new normal" chest x-ray.  This can be done at skilled nursing facility or with PCP depending on timing of discharge.   Above plan discussed with patient's wife over the phone who indicates understanding    Patient Instructions  1.  Complete your antibiotics as prescribed for the empyema   2.  Call if you have new or  worsening symptoms - fevers, chills, shortness of breath, cough with sputum production   3.  Recommend you have a follow up chest xray 2-3 weeks after completion of antibiotics to establish your new "normal" chest xray     Noe Gens, NP-C [place]  [phone] 05/14/2015, 2:34 PM

## 2015-05-14 NOTE — Patient Instructions (Addendum)
1.  Complete your antibiotics as prescribed for the empyema   2.  Call if you have new or worsening symptoms - fevers, chills, shortness of breath, cough with sputum production   3.  Recommend you have a follow up chest xray 2-3 weeks after completion of antibiotics to establish your new "normal" chest xray (discussed that this can be with PCP or at SNF)

## 2015-05-14 NOTE — Progress Notes (Signed)
Reviewed and agree with assessment/plan.  Chesley Mires, MD [facility] Pulmonary/Critical Care 05/14/2015, 2:47 PM Pager:  [phone]

## 2015-05-16 DIAGNOSIS — J90 Pleural effusion, not elsewhere classified: Secondary | ICD-10-CM | POA: Diagnosis not present

## 2015-05-16 DIAGNOSIS — Z09 Encounter for follow-up examination after completed treatment for conditions other than malignant neoplasm: Secondary | ICD-10-CM | POA: Diagnosis not present

## 2015-05-17 DIAGNOSIS — D500 Iron deficiency anemia secondary to blood loss (chronic): Secondary | ICD-10-CM | POA: Diagnosis not present

## 2015-05-17 DIAGNOSIS — I10 Essential (primary) hypertension: Secondary | ICD-10-CM | POA: Diagnosis not present

## 2015-05-18 ENCOUNTER — Encounter: Payer: Self-pay | Admitting: Adult Health

## 2015-05-18 ENCOUNTER — Non-Acute Institutional Stay: Payer: PPO | Admitting: Adult Health

## 2015-05-18 DIAGNOSIS — I10 Essential (primary) hypertension: Secondary | ICD-10-CM

## 2015-05-18 DIAGNOSIS — J189 Pneumonia, unspecified organism: Secondary | ICD-10-CM | POA: Diagnosis not present

## 2015-05-18 DIAGNOSIS — I2510 Atherosclerotic heart disease of native coronary artery without angina pectoris: Secondary | ICD-10-CM

## 2015-05-18 DIAGNOSIS — N183 Chronic kidney disease, stage 3 unspecified: Secondary | ICD-10-CM

## 2015-05-18 DIAGNOSIS — E43 Unspecified severe protein-calorie malnutrition: Secondary | ICD-10-CM

## 2015-05-18 DIAGNOSIS — J869 Pyothorax without fistula: Secondary | ICD-10-CM

## 2015-05-18 DIAGNOSIS — R5381 Other malaise: Secondary | ICD-10-CM

## 2015-05-18 DIAGNOSIS — D638 Anemia in other chronic diseases classified elsewhere: Secondary | ICD-10-CM | POA: Diagnosis not present

## 2015-05-18 DIAGNOSIS — D72829 Elevated white blood cell count, unspecified: Secondary | ICD-10-CM | POA: Diagnosis not present

## 2015-05-18 NOTE — Progress Notes (Signed)
Patient ID: [name], male   DOB: 12/19/1926, 82 y.o.   MRN: [id number]    DATE:  05/18/2015   MRN:  [id number]  BIRTHDAY: 1926-08-04  Facility:  [facility]ursing Home Location:  [place] Room Number: 1202-P  LEVEL OF CARE:  SNF (31)  Contact Information    Name Relation Home Work Mobile   Boldin,Freddie Spouse [phone]         Code Status History    Date Active Date Inactive Code Status Order ID Comments User Context   04/29/2015  5:58 PM 05/08/2015  7:04 PM Full Code [id number]  Cristal Ford, DO Inpatient       Chief Complaint  Patient presents with  . Discharge Note    HISTORY OF PRESENT ILLNESS:  This is an 82 year old male who is for discharge home with Home health PT for endurance and ST for linguistics. He has been admitted to [facility] on 05/08/15 from [facility] He has PMH of CAD, AS S/P AVR and hypertension. He was hospitalized due to Sepsis from community acquired pneumonia and was given antibiotics. He was found to have large left-sided lung empyema with loculation. He had thoracentesis and chest tube placement. He was discharged on Augmentin X 1 month. He had acute on chronic renal failure and received IV fluids.  Patient was admitted to this facility for short-term rehabilitation after the patient's recent hospitalization.  Patient has completed SNF rehabilitation and therapy has cleared the patient for discharge.   PAST MEDICAL HISTORY:  Past Medical History  Diagnosis Date  . Severe aortic stenosis 2010    S/P AVR-TISSUE VALVE  . Coronary artery disease with hx of myocardial infarct w/o hx of CABG 2010  . Hypertension   . Hyperlipidemia   . BPH (benign prostatic hyperplasia)   . Impaired fasting glucose   . Diverticulosis   . 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 Memory loss   . Retinal tear   . Memory difficulty 10/19/2012  . HOH (hard of hearing)     hearing aids     CURRENT MEDICATIONS: Reviewed   Patient's Medications  New Prescriptions   No medications on file  Previous Medications   AMLODIPINE (NORVASC) 10 MG TABLET    Take 1 tablet (10 mg total) by mouth daily.   AMOXICILLIN-CLAVULANATE (AUGMENTIN) 875-125 MG TABLET    Take 1 tablet by mouth every 12 (twelve) hours.   ASPIRIN EC 81 MG TABLET    Take 81 mg by mouth daily.   ATORVASTATIN (LIPITOR) 10 MG TABLET    Take 10 mg by mouth daily.   MULTIPLE VITAMINS-MINERALS (MULTIVITAMIN WITH MINERALS) TABLET    Take 1 tablet by mouth daily.   PROTEIN (PROCEL) POWD    Take 2 scoop by mouth 2 (two) times daily.  Modified Medications   No medications on file  Discontinued Medications   QUETIAPINE (SEROQUEL) 25 MG TABLET    Take 1 tablet (25 mg total) by mouth at bedtime as needed and may repeat dose one time if needed (at bedtime prn).     No Known Allergies   REVIEW OF SYSTEMS:  GENERAL: no change in appetite, no fatigue, no weight changes, no fever, chills or weakness SKIN: Denies rash, itching, wounds, ulcer sores, or nail abnormality EYES: Denies change in vision, dry eyes, eye pain, itching or discharge EARS: Denies change in hearing, ringing in ears, or earache NOSE: Denies nasal congestion or epistaxis MOUTH and THROAT: Denies  oral discomfort, gingival pain or bleeding, pain from teeth or hoarseness   RESPIRATORY: no cough, SOB, DOE, wheezing, hemoptysis CARDIAC: no chest pain, edema or palpitations GI: no abdominal pain, diarrhea, constipation, heart burn, nausea or vomiting GU: Denies dysuria, frequency, hematuria, incontinence, or discharge PSYCHIATRIC: Denies feeling of depression or anxiety. No report of hallucinations, insomnia, paranoia, or agitation   PHYSICAL EXAMINATION  GENERAL APPEARANCE: Well nourished. In no acute distress. Normal body habitus SKIN:  Skin is warm and dry. There are no suspicious lesions or rash HEAD: Normal in size and contour. No evidence of trauma EYES: Lids open and close normally.  No blepharitis, entropion or ectropion. PERRL. Conjunctivae are clear and sclerae are white. Lenses are without opacity EARS: Pinnae are normal. Patient hears normal voice tunes of the examiner MOUTH and THROAT: Lips are without lesions. Oral mucosa is moist and without lesions. Tongue is normal in shape, size, and color and without lesions NECK: supple, trachea midline, no neck masses, no thyroid tenderness, no thyromegaly LYMPHATICS: no LAN in the neck, no supraclavicular LAN RESPIRATORY: breathing is even & unlabored, BS CTAB CARDIAC: RRR, no murmur,no extra heart sounds, no edema GI: abdomen soft, normal BS, no masses, no tenderness, no hepatomegaly, no splenomegaly EXTREMITIES:  Able to move X 4 extremities PSYCHIATRIC: Alert and oriented X 3. Affect and behavior are appropriate  LABS/RADIOLOGY: Labs reviewed: Basic Metabolic Panel:  Recent Labs  05/02/15 0442 05/04/15 0454 05/07/15 05/07/15 0433 05/10/15  NA 137 137 137 137 137  K 4.0 4.0  --  4.6 4.5  CL 108 107  --  107  --   CO2 21* 22  --  22  --   GLUCOSE 153* 139*  --  110*  --   BUN 26* 20 17 17 16   CREATININE 1.20 1.04 0.9 0.87 1.0  CALCIUM 8.0* 7.8*  --  7.9*  --    Liver Function Tests:  Recent Labs  04/29/15 1429 05/10/15  AST 25 45*  ALT 24 73*  ALKPHOS 92 133*  BILITOT 1.2  --   PROT 7.4  --   ALBUMIN 2.9*  --    CBC:  Recent Labs  04/29/15 1429  05/06/15 0529 05/07/15 0433  05/08/15 0444 05/10/15 05/14/15  WBC 30.7*  < > 16.6* 16.2*  < > 15.8* 16.2  11.5 11.5  NEUTROABS 28.1*  --   --   --   --   --   --   --   HGB 11.1*  < > 10.8* 9.7*  --  10.3* 9.5*  9.9* 9.9*  HCT 32.1*  < > 30.9* 28.2*  --  30.1* 30*  30* 30*  MCV 87.5  < > 86.6 88.7  --  88.8  --   --   PLT 457*  < > 487* 482*  --  513* 555*  602* 602*  < > = values in this interval not displayed.    Dg Chest 1 View  04/30/2015  CLINICAL DATA:  Left thoracentesis EXAM: CHEST 1 VIEW COMPARISON:  04/29/2015 FINDINGS: Moderately  large left effusion, decreased in size from yesterday. No pneumothorax. Left lower lobe collapse Right lung clear IMPRESSION: Moderately large left effusion remains following thoracentesis. No pneumothorax. Electronically Signed   By: Franchot Gallo M.D.   On: 04/30/2015 12:48   Dg Chest 2 View  05/14/2015  CLINICAL DATA:  Followup of pneumonia. History aortic valve replacement. Smoker. EXAM: CHEST  2 VIEW COMPARISON:  05/07/2015 FINDINGS: Prior median sternotomy.  Aortic valve repair. Midline trachea. Normal heart size. Atherosclerosis in the transverse aorta. Small left pleural effusion remains. Air-fluid level on the lateral view is favored to be within the stomach. Similar left lower lobe airspace disease. Clear right lung. IMPRESSION: Similar small left pleural effusion with left base airspace disease. Air-fluid level on the lateral view is favored to be within the stomach. Small loculated left-sided hydropneumothorax cannot be excluded. Recommend attention on follow-up. Electronically Signed   By: Abigail Miyamoto M.D.   On: 05/14/2015 11:50   Dg Chest 2 View  04/29/2015  CLINICAL DATA:  82 year old with recent onset of cough and delays. EXAM: CHEST  2 VIEW COMPARISON:  11/05/2013 and earlier. FINDINGS: Prior sternotomy for aortic valve replacement and CABG. Cardiac silhouette likely normal in size, partially obscured by a large left pleural effusion. Associated dense consolidation in the left lower lobe and left upper lobe. Right lung clear. Pulmonary vascularity normal. No right pleural effusion. Degenerative changes and DISH involving the thoracic spine. IMPRESSION: Large left pleural effusion and associated dense passive atelectasis and/or pneumonia in the left lower lobe and left upper lobe. Electronically Signed   By: Evangeline Dakin M.D.   On: 04/29/2015 14:53   Ct Chest Wo Contrast  04/30/2015  CLINICAL DATA:  Shortness of breath and cough with generalized weakness beginning several weeks ago. Left  pleural effusion on current chest radiograph. EXAM: CT CHEST WITHOUT CONTRAST TECHNIQUE: Multidetector CT imaging of the chest was performed following the standard protocol without IV contrast. COMPARISON:  Chest radiograph, 04/30/2015. FINDINGS: Neck base and axilla:  No mass or adenopathy. Mediastinum and hila: Heart is normal in size. There changes from cardiac surgery and aortic valve replacement. Coronary artery and aortic atherosclerotic calcifications are noted. No mediastinal or hilar masses or enlarged lymph nodes. Lungs and pleura: Large left pleural effusion. There is significant atelectasis of the left lower lobe. There is atelectasis of a portion of the left upper lobe lingula with some minor dependent subsegmental atelectasis in the posterior left upper lobe. A component of left lung base pneumonia is possible. Minimal right pleural effusion with mild dependent right lower lobe subsegmental atelectasis. No convincing pneumonia and no pulmonary edema. No pneumothorax. Limited upper abdomen:  No acute findings. Musculoskeletal: Degenerative changes throughout the visualized spine. Bones demineralized. No osteoblastic or osteolytic lesions. IMPRESSION: 1. Large left pleural effusion with significant left lower lobe and left upper lobe lingula atelectasis. Consider pneumonia as a component of the parenchymal opacity given the patient's history. 2. Minimal right pleural effusion with associated dependent right lower lobe subsegmental atelectasis. 3. No pulmonary edema. Electronically Signed   By: Lajean Manes M.D.   On: 04/30/2015 15:28   Dg Chest Port 1 View  05/07/2015  CLINICAL DATA:  Left effusion.  Chest tube removal. EXAM: PORTABLE CHEST 1 VIEW 11:18 a.m. COMPARISON:  05/07/2015 at 8:53 a.m. and 05/06/2015 and 05/05/2015 and 04/29/2015 and chest CT dated 04/30/2015 FINDINGS: Left chest tube is been removed. No pneumothorax. Slight residual pleural thickening or loculated fluid laterally and  inferiorly at the left base. Improving focal compressive atelectasis in the left lower lobe. Right lung is clear. Heart size and vascularity are normal. Calcification in the thoracic aorta. Aortic valve replacement. IMPRESSION: No pneumothorax after chest tube removal. Slight residual pleural thickening and/or loculated pleural fluid and atelectasis at the left lung base. Electronically Signed   By: Lorriane Shire M.D.   On: 05/07/2015 11:46   Dg Chest Port 1 View  05/07/2015  CLINICAL DATA:  Empyema. EXAM: PORTABLE CHEST 1 VIEW COMPARISON:  May 06, 2015. FINDINGS: Stable cardiomediastinal silhouette. Status post aortic valve replacement. No pneumothorax is noted. Right lung is clear. Left-sided pigtail drainage catheter is unchanged in position. Left-sided loculated pleural effusion is unchanged in size. Stable left basilar opacity is noted concerning for atelectasis or possibly inflammation. IMPRESSION: Stable loculated left pleural effusion. No change in position of left-sided pigtail drainage catheter. Stable left basilar atelectasis or inflammation is noted Electronically Signed   By: Marijo Conception, M.D.   On: 05/07/2015 09:07   Dg Chest Port 1 View  05/06/2015  CLINICAL DATA:  Acute respiratory failure. Smoker. Prosthetic aortic valve. EXAM: PORTABLE CHEST 1 VIEW COMPARISON:  Yesterday. FINDINGS: Mildly decreased left basilar opacity and pleural fluid. A left basilar pigtail catheter remains in place. No pneumothorax is seen. The right lung is clear. The interstitial markings remain mildly prominent. No gross change in size of the heart with obscuration of the left heart borders. Median sternotomy wires and prosthetic aortic valve. Thoracic spine degenerative changes. IMPRESSION: 1. The mildly improved left basilar atelectasis and possible pneumonia. 2. Mildly decreased left pleural fluid. 3. Stable mild chronic interstitial lung disease. Electronically Signed   By: Claudie Revering M.D.   On: 05/06/2015  07:39   Dg Chest Port 1 View  05/05/2015  CLINICAL DATA:  82 year old male with history of left-sided empyema. History of severe aortic stenosis status post aortic valve replacement. EXAM: PORTABLE CHEST 1 VIEW COMPARISON:  Chest x-ray 05/04/2015. FINDINGS: Left-sided pigtail drainage catheter remains in the left hemithorax, with the tip projecting over the mid left hemithorax. There continues to be left pleural fluid with irregular contours, indicative of a loculated pleural effusion. Opacities throughout the left mid to lower lung likely reflect associated areas of passive atelectasis and/or airspace consolidation. Right lung is clear. No right pleural effusion. No evidence of pulmonary edema. Heart size appears mildly enlarged (unchanged). Upper mediastinal contours are within normal limits. Atherosclerosis in the thoracic aorta. Status post median sternotomy for CABG and aortic valve replacement (a stented bio prosthesis is noted). IMPRESSION: 1. Support apparatus and postoperative changes, as above. 2. Persistent moderate loculated left pleural fluid collection compatible with the reported history of empyema. This is associated with extensive areas of atelectasis and/or airspace consolidation throughout the left mid to lower lung. The overall appearance the chest is very similar to the prior examination from 05/04/2015. Electronically Signed   By: Vinnie Langton M.D.   On: 05/05/2015 09:27   Dg Chest Port 1 View  05/04/2015  CLINICAL DATA:  Chest tube. EXAM: PORTABLE CHEST 1 VIEW COMPARISON:  05/03/2015.  CT 04/30/2015 . FINDINGS: Left chest tube in stable position. Pleural fluid collection on the left again noted. Slight decrease in size. Loculation may be present. Left lower lobe atelectasis and or infiltrate . No prominent pneumothorax. Mild pleural air collection along the left base of the along the chest tube courses cannot be entirely excluded . Right lung is clear. Prior CABG and cardiac valve  replacement. Stable cardiomegaly. No pulmonary venous congestion. IMPRESSION: 1. Left chest tube in stable position. Left pleural fluid collection has decreased in size. A component of loculation may be present. Left lower lobe atelectasis and/or infiltrate. Small amount of pleural air along the left base along the course of the left chest tube cannot be excluded. Continued follow-up exam suggested. 2.  Prior CABG.  Cardiac valve replacement.  Stable cardiomegaly. Critical Value/emergent results were called by  telephone at the time of interpretation on 05/04/2015 at 7:19 am to nurse Lenell Antu , who verbally acknowledged these results. Electronically Signed   By: Marcello Moores  Register   On: 05/04/2015 07:22   Dg Chest Port 1 View  05/03/2015  CLINICAL DATA:  Cough, left effusion EXAM: PORTABLE CHEST 1 VIEW COMPARISON:  CT chest of 04/30/2015, and chest x-ray of 04/30/2015 FINDINGS: The left chest tube is present and there has been a slight decrease in volume of the left pleural effusion with probable left basilar atelectasis. The right lung is clear. Cardiomegaly is stable and median sternotomy sutures are noted with aortic valve replacement present. IMPRESSION: 1. Left chest tube present with slight decrease in volume of the left pleural effusion. 2. Stable cardiomegaly. Electronically Signed   By: Ivar Drape M.D.   On: 05/03/2015 08:13   Ct Image Guided Drainage By Percutaneous Catheter  05/02/2015  CLINICAL DATA:  Left lung pneumonia with large left pleural empyema. Prior thoracentesis on 04/30/2015 demonstrated fluid consistent with empyema. The patient is not a candidate currently for surgical intervention with VATS. Request has been made to place a percutaneous drainage catheter into the left empyema. EXAM: CT GUIDED DRAINAGE OF LEFT PLEURAL EMPYEMA ANESTHESIA/SEDATION: 0.5 Mg IV Versed 25 mcg IV Fentanyl Total Moderate Sedation Time:  20 minutes. The patient's level of consciousness and physiologic status were  continuously monitored during the procedure by Radiology nursing. PROCEDURE: The procedure, risks, benefits, and alternatives were explained to the patient. Questions regarding the procedure were encouraged and answered. The patient understands and consents to the procedure. A time-out was performed prior to initiating the procedure. The left chest wall was prepped with chlorhexidine in a sterile fashion, and a sterile drape was applied covering the operative field. A sterile gown and sterile gloves were used for the procedure. Local anesthesia was provided with 1% Lidocaine. CT was performed in a supine position. After localizing the left pleural collection, an 18 gauge trocar needle was advanced into the left pleural space from an anterior approach. After return of fluid, a guidewire was advanced through the needle. The tract was dilated over the wire and a 14 French pigtail drainage catheter advanced. Catheter positioning was confirmed by CT. The catheter was connected to a [place] Pleur-Evac device. It was secured at the skin with a Prolene retention suture, StatLock device and overlying dressing. COMPLICATIONS: None FINDINGS: Initial CT shows significant reaccumulation of a large left pleural effusion which is loculated and has been demonstrated to be consistent with empyema after prior sampling. There is significant consolidation of the left lung. Fluid return demonstrates mildly turbid yellowish fluid from the left pleural space. A 14 French pigtail catheter was placed and is draining well after placement. IMPRESSION: CT-guided left pleural drainage catheter placement to treat empyema. A 14 French pigtail drainage catheter was placed. The catheter was connected to a [place] Pleur-Evac device and will be connected to wall suction at -20 cm of water. Electronically Signed   By: Aletta Edouard M.D.   On: 05/02/2015 16:44   US Thoracentesis Asp Pleural Space W/img Guide  04/30/2015  INDICATION: 82 year old male  who was admitted through the emergency department with sepsis secondary to community acquired pneumonia. The patient was found have a large left-sided pleural effusion on chest x-ray. Request has been made for a therapeutic and diagnostic thoracentesis. EXAM: ULTRASOUND GUIDED DIAGNOSTIC AND THERAPEUTIC THORACENTESIS MEDICATIONS: 1% lidocaine COMPLICATIONS: None immediate. PROCEDURE: An ultrasound guided thoracentesis was thoroughly discussed with the patient and questions  answered. The benefits, risks, alternatives and complications were also discussed. The patient understands and wishes to proceed with the procedure. Written consent was obtained. Ultrasound was performed to localize and mark an adequate pocket of fluid in the left chest. The area was then prepped and draped in the normal sterile fashion. 1% Lidocaine was used for local anesthesia. Under ultrasound guidance a Safe-T-Centesis catheter was introduced. Thoracentesis was performed. The procedure was stopped after 1-1/2 L of fluid was obtained. This is standard procedure for a first thoracentesis and the patient had an elevated creatinine as well at 1.56. The catheter was removed and a dressing applied. FINDINGS: A total of approximately 1.5 L of cloudy amber fluid was removed. Multiple loculations were noted on ultrasound. After fluid was removed, the final image revealed that a significant portion of the effusion was removed with this procedure. Samples were sent to the laboratory as requested by the clinical team. IMPRESSION: Successful ultrasound guided left thoracentesis yielding 1.5 L of pleural fluid. Read by: Saverio Danker, PA-C Electronically Signed   By: Jacqulynn Cadet M.D.   On: 04/30/2015 12:46    ASSESSMENT/PLAN:  Physical deconditioning - for Home health PT and ST  CAP - stable; continue Augmentin 875-125 mg Q 12 hours till 06/05/15  Left lung empyema - continue Augmentin till 06/05/15; wbc is trending down  Leukocytosis - wbc  11.5, trending down  Anemia of chronic disease - hgb 9.9, stable  Chronic kidney disease, stage 3  -  creatinine 1.0, improved  Hypertension - well-controlled; continue Amlodipine 10 mg daily  CAD - stable; continue ASA EC 81 mg daily and Atorvastatin 10 mg daily  Protein-calorie malnutrition, severe - albumin 2.9; continue Procel 2 scoops BID    I have filled out patient's discharge paperwork and written prescriptions.  Patient will receive home health PT and ST.  Total discharge time: Less than 30 minutes  Discharge time involved coordination of the discharge process with Education officer, museum, nursing staff and therapy department. Medical justification for home health services verified.      [facility], NP [employer] [phone]

## 2015-05-21 ENCOUNTER — Telehealth: Payer: Self-pay | Admitting: Interventional Cardiology

## 2015-05-21 NOTE — Telephone Encounter (Signed)
I spoke with the pt's wife and made her aware of medication list in Epic.  I answered all questions in regards to medications.

## 2015-05-21 NOTE — Telephone Encounter (Signed)
New message      Recently discharged from [facility] for pneumonia.  Calling to go over medication list to make sure he is taking the correct medications

## 2015-06-06 DIAGNOSIS — I2510 Atherosclerotic heart disease of native coronary artery without angina pectoris: Secondary | ICD-10-CM | POA: Diagnosis not present

## 2015-06-06 DIAGNOSIS — Z7982 Long term (current) use of aspirin: Secondary | ICD-10-CM | POA: Diagnosis not present

## 2015-06-06 DIAGNOSIS — Z720 Tobacco use: Secondary | ICD-10-CM | POA: Diagnosis not present

## 2015-06-06 DIAGNOSIS — I129 Hypertensive chronic kidney disease with stage 1 through stage 4 chronic kidney disease, or unspecified chronic kidney disease: Secondary | ICD-10-CM | POA: Diagnosis not present

## 2015-06-06 DIAGNOSIS — Z952 Presence of prosthetic heart valve: Secondary | ICD-10-CM | POA: Diagnosis not present

## 2015-06-06 DIAGNOSIS — H9193 Unspecified hearing loss, bilateral: Secondary | ICD-10-CM | POA: Diagnosis not present

## 2015-06-06 DIAGNOSIS — E43 Unspecified severe protein-calorie malnutrition: Secondary | ICD-10-CM | POA: Diagnosis not present

## 2015-06-06 DIAGNOSIS — D638 Anemia in other chronic diseases classified elsewhere: Secondary | ICD-10-CM | POA: Diagnosis not present

## 2015-06-06 DIAGNOSIS — M6281 Muscle weakness (generalized): Secondary | ICD-10-CM | POA: Diagnosis not present

## 2015-06-06 DIAGNOSIS — F0390 Unspecified dementia without behavioral disturbance: Secondary | ICD-10-CM | POA: Diagnosis not present

## 2015-06-06 DIAGNOSIS — Z9181 History of falling: Secondary | ICD-10-CM | POA: Diagnosis not present

## 2015-06-06 DIAGNOSIS — Z8701 Personal history of pneumonia (recurrent): Secondary | ICD-10-CM | POA: Diagnosis not present

## 2015-06-06 DIAGNOSIS — E785 Hyperlipidemia, unspecified: Secondary | ICD-10-CM | POA: Diagnosis not present

## 2015-06-06 DIAGNOSIS — N183 Chronic kidney disease, stage 3 (moderate): Secondary | ICD-10-CM | POA: Diagnosis not present

## 2015-06-08 ENCOUNTER — Ambulatory Visit
Admission: RE | Admit: 2015-06-08 | Discharge: 2015-06-08 | Disposition: A | Discharge status: Home / Self Care | Payer: PPO | Source: Ambulatory Visit | Attending: Internal Medicine | Admitting: Internal Medicine

## 2015-06-08 ENCOUNTER — Other Ambulatory Visit: Payer: Self-pay | Admitting: Internal Medicine

## 2015-06-08 DIAGNOSIS — J189 Pneumonia, unspecified organism: Secondary | ICD-10-CM

## 2015-06-08 DIAGNOSIS — J869 Pyothorax without fistula: Secondary | ICD-10-CM | POA: Diagnosis not present

## 2015-06-12 DIAGNOSIS — D638 Anemia in other chronic diseases classified elsewhere: Secondary | ICD-10-CM | POA: Diagnosis not present

## 2015-06-12 DIAGNOSIS — D225 Melanocytic nevi of trunk: Secondary | ICD-10-CM | POA: Diagnosis not present

## 2015-06-12 DIAGNOSIS — Z8701 Personal history of pneumonia (recurrent): Secondary | ICD-10-CM | POA: Diagnosis not present

## 2015-06-12 DIAGNOSIS — Z720 Tobacco use: Secondary | ICD-10-CM | POA: Diagnosis not present

## 2015-06-12 DIAGNOSIS — I129 Hypertensive chronic kidney disease with stage 1 through stage 4 chronic kidney disease, or unspecified chronic kidney disease: Secondary | ICD-10-CM | POA: Diagnosis not present

## 2015-06-12 DIAGNOSIS — Z7982 Long term (current) use of aspirin: Secondary | ICD-10-CM | POA: Diagnosis not present

## 2015-06-12 DIAGNOSIS — L821 Other seborrheic keratosis: Secondary | ICD-10-CM | POA: Diagnosis not present

## 2015-06-12 DIAGNOSIS — N183 Chronic kidney disease, stage 3 (moderate): Secondary | ICD-10-CM | POA: Diagnosis not present

## 2015-06-12 DIAGNOSIS — Z952 Presence of prosthetic heart valve: Secondary | ICD-10-CM | POA: Diagnosis not present

## 2015-06-12 DIAGNOSIS — I2510 Atherosclerotic heart disease of native coronary artery without angina pectoris: Secondary | ICD-10-CM | POA: Diagnosis not present

## 2015-06-12 DIAGNOSIS — M6281 Muscle weakness (generalized): Secondary | ICD-10-CM | POA: Diagnosis not present

## 2015-06-12 DIAGNOSIS — F0390 Unspecified dementia without behavioral disturbance: Secondary | ICD-10-CM | POA: Diagnosis not present

## 2015-06-12 DIAGNOSIS — E785 Hyperlipidemia, unspecified: Secondary | ICD-10-CM | POA: Diagnosis not present

## 2015-06-12 DIAGNOSIS — E43 Unspecified severe protein-calorie malnutrition: Secondary | ICD-10-CM | POA: Diagnosis not present

## 2015-06-12 DIAGNOSIS — H9193 Unspecified hearing loss, bilateral: Secondary | ICD-10-CM | POA: Diagnosis not present

## 2015-06-13 ENCOUNTER — Telehealth: Payer: Self-pay | Admitting: *Deleted

## 2015-06-13 NOTE — Telephone Encounter (Signed)
Patient called to request a refill on his statin be sent to [facility], but he is not sure if Dr Irish Lack wants him to take pravastatin or atorvastatin. Please advise. Thanks, MI

## 2015-06-13 NOTE — Telephone Encounter (Signed)
Per the pts last OV note from 03/08/15 he is taking Pravastatin 20 mg qd. OV note also states hyperlipidemia is checked by Dr Laurann Montana. Thanks.

## 2015-06-13 NOTE — Telephone Encounter (Signed)
Patient aware and he will call Dr Wonda Amis office.

## 2015-06-14 DIAGNOSIS — H6123 Impacted cerumen, bilateral: Secondary | ICD-10-CM | POA: Diagnosis not present

## 2015-06-15 ENCOUNTER — Ambulatory Visit: Payer: PPO | Admitting: Podiatry

## 2015-06-15 ENCOUNTER — Encounter (HOSPITAL_COMMUNITY): Payer: Self-pay | Admitting: Emergency Medicine

## 2015-06-15 ENCOUNTER — Emergency Department (HOSPITAL_COMMUNITY): Payer: PPO

## 2015-06-15 ENCOUNTER — Emergency Department (HOSPITAL_COMMUNITY)
Admission: EM | Admit: 2015-06-15 | Discharge: 2015-06-15 | Disposition: A | Discharge status: Home / Self Care | Payer: PPO | Attending: Emergency Medicine | Admitting: Emergency Medicine

## 2015-06-15 DIAGNOSIS — Y999 Unspecified external cause status: Secondary | ICD-10-CM | POA: Diagnosis not present

## 2015-06-15 DIAGNOSIS — I252 Old myocardial infarction: Secondary | ICD-10-CM | POA: Insufficient documentation

## 2015-06-15 DIAGNOSIS — I2510 Atherosclerotic heart disease of native coronary artery without angina pectoris: Secondary | ICD-10-CM | POA: Diagnosis not present

## 2015-06-15 DIAGNOSIS — S6990XA Unspecified injury of unspecified wrist, hand and finger(s), initial encounter: Secondary | ICD-10-CM | POA: Diagnosis not present

## 2015-06-15 DIAGNOSIS — I10 Essential (primary) hypertension: Secondary | ICD-10-CM | POA: Insufficient documentation

## 2015-06-15 DIAGNOSIS — T07XXXA Unspecified multiple injuries, initial encounter: Secondary | ICD-10-CM

## 2015-06-15 DIAGNOSIS — S0993XA Unspecified injury of face, initial encounter: Secondary | ICD-10-CM | POA: Diagnosis not present

## 2015-06-15 DIAGNOSIS — W010XXA Fall on same level from slipping, tripping and stumbling without subsequent striking against object, initial encounter: Secondary | ICD-10-CM | POA: Insufficient documentation

## 2015-06-15 DIAGNOSIS — F17290 Nicotine dependence, other tobacco product, uncomplicated: Secondary | ICD-10-CM | POA: Insufficient documentation

## 2015-06-15 DIAGNOSIS — S0181XA Laceration without foreign body of other part of head, initial encounter: Secondary | ICD-10-CM | POA: Insufficient documentation

## 2015-06-15 DIAGNOSIS — Z79899 Other long term (current) drug therapy: Secondary | ICD-10-CM | POA: Insufficient documentation

## 2015-06-15 DIAGNOSIS — Z7982 Long term (current) use of aspirin: Secondary | ICD-10-CM | POA: Insufficient documentation

## 2015-06-15 DIAGNOSIS — Y9301 Activity, walking, marching and hiking: Secondary | ICD-10-CM | POA: Insufficient documentation

## 2015-06-15 DIAGNOSIS — S0990XA Unspecified injury of head, initial encounter: Secondary | ICD-10-CM | POA: Diagnosis not present

## 2015-06-15 DIAGNOSIS — Y9289 Other specified places as the place of occurrence of the external cause: Secondary | ICD-10-CM | POA: Diagnosis not present

## 2015-06-15 DIAGNOSIS — S0081XA Abrasion of other part of head, initial encounter: Secondary | ICD-10-CM | POA: Diagnosis not present

## 2015-06-15 DIAGNOSIS — T148 Other injury of unspecified body region: Secondary | ICD-10-CM | POA: Diagnosis not present

## 2015-06-15 DIAGNOSIS — S01511A Laceration without foreign body of lip, initial encounter: Secondary | ICD-10-CM | POA: Diagnosis not present

## 2015-06-15 MED ORDER — LIDOCAINE-EPINEPHRINE (PF) 2 %-1:200000 IJ SOLN
10.0000 mL | Freq: Once | INTRAMUSCULAR | Status: AC
  Administered 2015-06-15: 10 mL

## 2015-06-15 MED ORDER — LIDOCAINE-EPINEPHRINE (PF) 2 %-1:200000 IJ SOLN
INTRAMUSCULAR | Status: AC
  Filled 2015-06-15: qty 20

## 2015-06-15 MED ORDER — BACITRACIN ZINC 500 UNIT/GM EX OINT
TOPICAL_OINTMENT | Freq: Once | CUTANEOUS | Status: AC
  Administered 2015-06-15: 23:00:00 via TOPICAL
  Filled 2015-06-15: qty 1.8

## 2015-06-15 NOTE — ED Notes (Signed)
Patient returned to room at this time.

## 2015-06-15 NOTE — ED Provider Notes (Signed)
LACERATION REPAIR Performed by: Antonietta Breach Authorized by: Dorie Rank Consent: Verbal consent obtained. Risks and benefits: risks, benefits and alternatives were discussed Consent given by: patient Patient identity confirmed: provided demographic data Prepped and Draped in normal sterile fashion Wound explored  Laceration Location: Chin  Laceration Length: 3cm  No Foreign Bodies seen or palpated  Anesthesia: local infiltration  Local anesthetic: lidocaine 2% with epinephrine  Anesthetic total: 4 ml  Irrigation method: syringe Amount of cleaning: standard  Skin closure: 4-0 prolene  Number of sutures: 4  Technique: simple interrupted  Patient tolerance: Patient tolerated the procedure well with no immediate complications.  LACERATION REPAIR Performed by: Antonietta Breach Authorized by: Dorie Rank Consent: Verbal consent obtained. Risks and benefits: risks, benefits and alternatives were discussed Consent given by: patient Patient identity confirmed: provided demographic data Prepped and Draped in normal sterile fashion Wound explored  Laceration Location: Chin  Laceration Length: 3cm  No Foreign Bodies seen or palpated  Anesthesia: local infiltration  Local anesthetic: lidocaine 2% with epinephrine  Anesthetic total: 4 ml  Irrigation method: syringe Amount of cleaning: standard  Skin closure: 5-0 vicryl  Number of sutures: 1  Technique: subcutaneous for hemostasis  Patient tolerance: Patient tolerated the procedure well with no immediate complications.   Antonietta Breach, PA-C 06/15/15 2314  Dorie Rank, MD 06/16/15 1332

## 2015-06-15 NOTE — ED Notes (Signed)
Pt to CT at this time.

## 2015-06-15 NOTE — Discharge Instructions (Signed)
Facial Laceration ° A facial laceration is a cut on the face. These injuries can be painful and cause bleeding. Lacerations usually heal quickly, but they need special care to reduce scarring. °DIAGNOSIS  °Your health care provider will take a medical history, ask for details about how the injury occurred, and examine the wound to determine how deep the cut is. °TREATMENT  °Some facial lacerations may not require closure. Others may not be able to be closed because of an increased risk of infection. The risk of infection and the chance for successful closure will depend on various factors, including the amount of time since the injury occurred. °The wound may be cleaned to help prevent infection. If closure is appropriate, pain medicines may be given if needed. Your health care provider will use stitches (sutures), wound glue (adhesive), or skin adhesive strips to repair the laceration. These tools bring the skin edges together to allow for faster healing and a better cosmetic outcome. If needed, you may also be given a tetanus shot. °HOME CARE INSTRUCTIONS °· Only take over-the-counter or prescription medicines as directed by your health care provider. °· Follow your health care provider's instructions for wound care. These instructions will vary depending on the technique used for closing the wound. °For Sutures: °· Keep the wound clean and dry.   °· If you were given a bandage (dressing), you should change it at least once a day. Also change the dressing if it becomes wet or dirty, or as directed by your health care provider.   °· Wash the wound with soap and water 2 times a day. Rinse the wound off with water to remove all soap. Pat the wound dry with a clean towel.   °· After cleaning, apply a thin layer of the antibiotic ointment recommended by your health care provider. This will help prevent infection and keep the dressing from sticking.   °· You may shower as usual after the first 24 hours. Do not soak the  wound in water until the sutures are removed.   °· Get your sutures removed as directed by your health care provider. With facial lacerations, sutures should usually be taken out after 4-5 days to avoid stitch marks.   °· Wait a few days after your sutures are removed before applying any makeup. °For Skin Adhesive Strips: °· Keep the wound clean and dry.   °· Do not get the skin adhesive strips wet. You may bathe carefully, using caution to keep the wound dry.   °· If the wound gets wet, pat it dry with a clean towel.   °· Skin adhesive strips will fall off on their own. You may trim the strips as the wound heals. Do not remove skin adhesive strips that are still stuck to the wound. They will fall off in time.   °For Wound Adhesive: °· You may briefly wet your wound in the shower or bath. Do not soak or scrub the wound. Do not swim. Avoid periods of heavy sweating until the skin adhesive has fallen off on its own. After showering or bathing, gently pat the wound dry with a clean towel.   °· Do not apply liquid medicine, cream medicine, ointment medicine, or makeup to your wound while the skin adhesive is in place. This may loosen the film before your wound is healed.   °· If a dressing is placed over the wound, be careful not to apply tape directly over the skin adhesive. This may cause the adhesive to be pulled off before the wound is healed.   °· Avoid   prolonged exposure to sunlight or tanning lamps while the skin adhesive is in place. °· The skin adhesive will usually remain in place for 5-10 days, then naturally fall off the skin. Do not pick at the adhesive film.   °After Healing: °Once the wound has healed, cover the wound with sunscreen during the day for 1 full year. This can help minimize scarring. Exposure to ultraviolet light in the first year will darken the scar. It can take 1-2 years for the scar to lose its redness and to heal completely.  °SEEK MEDICAL CARE IF: °· You have a fever. °SEEK IMMEDIATE  MEDICAL CARE IF: °· You have redness, pain, or swelling around the wound.   °· You see a yellowish-white fluid (pus) coming from the wound.   °  °This information is not intended to replace advice given to you by your health care provider. Make sure you discuss any questions you have with your health care provider. °  °Document Released: 02/14/2004 Document Revised: 01/27/2014 Document Reviewed: 08/19/2012 °Elsevier Interactive Patient Education ©2016 [employer] ° °

## 2015-06-15 NOTE — ED Notes (Signed)
PA aware supplies at bedside for lac repair. Pt remains awaiting CT.

## 2015-06-15 NOTE — ED Provider Notes (Signed)
CSN: [id number]     Arrival date & time 06/15/15  1924 History   First MD Initiated Contact with Patient 06/15/15 1945     Chief Complaint  Patient presents with  . Fall   HPI Pt was walking outside at [employer].  He tripped over the neighbors dog and landed on the pavement.  He sustained abrasions and a laceration to his chin.  No loc.  No numbness or weakness.  No pain in his extremities.  Denies taking any blood thinning medications. Past Medical History  Diagnosis Date  . Severe aortic stenosis 2010    S/P AVR-TISSUE VALVE  . Coronary artery disease with hx of myocardial infarct w/o hx of CABG 2010  . Hypertension   . Hyperlipidemia   . BPH (benign prostatic hyperplasia)   . Impaired fasting glucose   . Diverticulosis   . 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 Memory loss   . Retinal tear   . Memory difficulty 10/19/2012  . HOH (hard of hearing)     hearing aids   Past Surgical History  Procedure Laterality Date  . Tonsillectomy    . Aortic valve replacement    . 2 vessel cabg    . Bartle    . Cataract extraction, bilateral      RT 2010 LT 2014   Family History  Problem Relation Age of Onset  . Stroke Father   . Cancer Brother   . Heart attack Neg Hx   . Hypertension Brother    Social History  Substance Use Topics  . Smoking status: Current Some Day Smoker    Types: Pipe  . Smokeless tobacco: Never Used     Comment: PIPE  . Alcohol Use: No     Comment: RARELY    Review of Systems  All other systems reviewed and are negative.     Allergies  Review of patient's allergies indicates no known allergies.  Home Medications   Prior to Admission medications   Medication Sig Start Date End Date Taking? Authorizing Provider  aspirin EC 81 MG tablet Take 81 mg by mouth daily.   Yes Historical Provider, MD  atorvastatin (LIPITOR) 10 MG tablet Take 10 mg by mouth daily.   Yes Historical Provider, MD  Multiple Vitamins-Minerals (MULTIVITAMIN WITH MINERALS) tablet  Take 1 tablet by mouth daily.   Yes Historical Provider, MD  amLODipine (NORVASC) 10 MG tablet Take 1 tablet (10 mg total) by mouth daily. 03/08/15   Jettie Booze, MD  amoxicillin-clavulanate (AUGMENTIN) 875-125 MG tablet Take 1 tablet by mouth every 12 (twelve) hours. 05/08/15   Orson Eva, MD   BP 181/71 mmHg  Pulse 78  Temp(Src) 97.9 F (36.6 C)  Resp 20  Wt 70.761 kg  SpO2 98% Physical Exam  Constitutional: No distress.  HENT:  Head: Normocephalic.  Right Ear: External ear normal.  Left Ear: External ear normal.  Abrasions noted on face, laceration of chin, small superficial laceration on lower lip  Eyes: Conjunctivae are normal. Right eye exhibits no discharge. Left eye exhibits no discharge. No scleral icterus.  Neck: Neck supple. No tracheal deviation present.  Cardiovascular: Normal rate, regular rhythm and intact distal pulses.   Pulmonary/Chest: Effort normal and breath sounds normal. No stridor. No respiratory distress. He has no wheezes. He has no rales.  Abdominal: Soft. Bowel sounds are normal. He exhibits no distension. There is no tenderness. There is no rebound and no guarding.  Musculoskeletal: He exhibits no edema or tenderness.  Right shoulder: Normal.       Left shoulder: Normal.       Right hip: Normal.       Left hip: Normal.       Cervical back: Normal.       Thoracic back: Normal.       Lumbar back: Normal.  Neurological: He is alert. He has normal strength. No cranial nerve deficit (no facial droop, extraocular movements intact, no slurred speech) or sensory deficit. He exhibits normal muscle tone. He displays no seizure activity. Coordination normal.  Skin: Skin is warm and dry. No rash noted.  Psychiatric: He has a normal mood and affect.  Nursing note and vitals reviewed.   ED Course  Procedures (including critical care time) Labs Review Labs Reviewed - No data to display  Imaging Review Ct Head Wo Contrast  06/15/2015  CLINICAL DATA:   Recent trip and fall with facial bruising and headaches EXAM: CT HEAD WITHOUT CONTRAST TECHNIQUE: Contiguous axial images were obtained from the base of the skull through the vertex without intravenous contrast. COMPARISON:  None. FINDINGS: Bony calvarium is intact. Degenerative changes are noted at the articulation of C1 and C2. Atrophic changes are noted. Mild chronic white matter ischemic change is seen. No findings to suggest acute hemorrhage, acute infarction or space-occupying mass lesion are noted. IMPRESSION: Atrophic and ischemic changes without acute abnormality. Electronically Signed   By: Inez Catalina M.D.   On: 06/15/2015 21:52   I have personally reviewed and evaluated these images and lab results as part of my medical decision-making.  Laceration repaired by PA Humes  MDM   Final diagnoses:  Chin laceration, initial encounter  Abrasions of multiple sites    No signs of serious injury.  Laceration repaired.  Wound care provided. Pt stable for discharge.    Dorie Rank, MD 06/15/15 2240

## 2015-06-15 NOTE — ED Notes (Signed)
Pt BIBA, pt a&ox4, reports mechanical trip and fall over neighbors dog, outside, onto pavement. Pt fel onto outstretched arms, and then hit his face. Pt noted to have a 2cm lac to his chin, and abrasions to bilateral hands and L side of face. Small amount of bruising noted below L eye. Pt denies LOC. Pt denies taking anticoagulants, but does have a hx of an aortic valve replacement. Pt with towel around neck for support. Pt noted to be hypertensive, hx of same.

## 2015-06-15 NOTE — ED Notes (Signed)
Bed: [id number] Expected date:  Expected time:  Means of arrival:  Comments: EMS 82yo M fall / abrasions to face / laceration to chin

## 2015-06-15 NOTE — ED Notes (Signed)
Wounds to L side of face and bilateral hands dressed with gauze after Bacitracin was applied.

## 2015-06-15 NOTE — ED Notes (Signed)
Pt given discharge information. Pt denied further questions or concerns. Pt states pain tolerable at 1/10. Pt aware of need to follow up, medications to use at home and reasons to return to the ED. Pt escorted to exit via wheelchair without difficulty.

## 2015-06-19 ENCOUNTER — Telehealth: Payer: Self-pay | Admitting: Interventional Cardiology

## 2015-06-19 NOTE — Telephone Encounter (Signed)
The pts wife is advised that the pt should be taking Amlodipine 10 mg daily. She verbalized understanding and thanked me for my help.

## 2015-06-19 NOTE — Telephone Encounter (Signed)
New message      Pt c/o medication issue:  1. Name of Medication: amlodipine  2. How are you currently taking this medication (dosage and times per day)? 10 mg po daily  3. Are you having a reaction (difficulty breathing--STAT)? No  4. What is your medication issue? Per wife she stated the pt seen the MD and suppose to be off the Amlodipine, the wife is making sure the pt was to take it or not.

## 2015-06-20 ENCOUNTER — Encounter: Payer: Self-pay | Admitting: Podiatry

## 2015-06-20 ENCOUNTER — Ambulatory Visit (INDEPENDENT_AMBULATORY_CARE_PROVIDER_SITE_OTHER): Payer: PPO | Admitting: Podiatry

## 2015-06-20 VITALS — BP 147/79 | HR 86 | Resp 14

## 2015-06-20 DIAGNOSIS — M79676 Pain in unspecified toe(s): Secondary | ICD-10-CM | POA: Diagnosis not present

## 2015-06-20 DIAGNOSIS — B351 Tinea unguium: Secondary | ICD-10-CM

## 2015-06-20 NOTE — Progress Notes (Signed)
Patient ID: [name], male   DOB: 1926-07-28, 82 y.o.   MRN: [id number] Complaint:  Visit Type: Patient returns to my office for continued preventative foot care services. Complaint: Patient states" my nails have grown long and thick and become painful to walk and wear shoes" . The patient presents for preventative foot care services. No changes to ROS.  Patient has been hospitalized with pnneumonia and feel on his face last Friday.  Podiatric Exam: Vascular: dorsalis pedis and posterior tibial pulses are palpable bilateral. Capillary return is immediate. Temperature gradient is WNL. Skin turgor WNL  Sensorium: Normal Semmes Weinstein monofilament test. Normal tactile sensation bilaterally. Nail Exam: Pt has thick disfigured discolored nails with subungual debris noted bilateral entire nail hallux through fifth toenails Ulcer Exam: There is no evidence of ulcer or pre-ulcerative changes or infection. Orthopedic Exam: Muscle tone and strength are WNL. No limitations in general ROM. No crepitus or effusions noted. Foot type and digits show no abnormalities. Bony prominences are unremarkable. Skin: No Porokeratosis. No infection or ulcers  Diagnosis:  Onychomycosis, , Pain in right toe, pain in left toes  Treatment & Plan Procedures and Treatment: Consent by patient was obtained for treatment procedures. The patient understood the discussion of treatment and procedures well. All questions were answered thoroughly reviewed. Debridement of mycotic and hypertrophic toenails, 1 through 5 bilateral and clearing of subungual debris. No ulceration, no infection noted.  Return Visit-Office Procedure: Patient instructed to return to the office for a follow up visit 3 months for continued evaluation and treatment.    Gardiner Barefoot DPM

## 2015-06-22 DIAGNOSIS — S0181XA Laceration without foreign body of other part of head, initial encounter: Secondary | ICD-10-CM | POA: Diagnosis not present

## 2015-07-05 DIAGNOSIS — H02831 Dermatochalasis of right upper eyelid: Secondary | ICD-10-CM | POA: Diagnosis not present

## 2015-07-05 DIAGNOSIS — Z961 Presence of intraocular lens: Secondary | ICD-10-CM | POA: Diagnosis not present

## 2015-07-05 DIAGNOSIS — H02834 Dermatochalasis of left upper eyelid: Secondary | ICD-10-CM | POA: Diagnosis not present

## 2015-07-05 DIAGNOSIS — H40013 Open angle with borderline findings, low risk, bilateral: Secondary | ICD-10-CM | POA: Diagnosis not present

## 2015-07-20 DIAGNOSIS — Z8701 Personal history of pneumonia (recurrent): Secondary | ICD-10-CM | POA: Diagnosis not present

## 2015-08-05 ENCOUNTER — Emergency Department (HOSPITAL_COMMUNITY)
Admission: EM | Admit: 2015-08-05 | Discharge: 2015-08-05 | Disposition: A | Discharge status: Home / Self Care | Payer: PPO | Attending: Emergency Medicine | Admitting: Emergency Medicine

## 2015-08-05 ENCOUNTER — Encounter (HOSPITAL_COMMUNITY): Payer: Self-pay | Admitting: *Deleted

## 2015-08-05 DIAGNOSIS — Y929 Unspecified place or not applicable: Secondary | ICD-10-CM | POA: Insufficient documentation

## 2015-08-05 DIAGNOSIS — S70321A Blister (nonthermal), right thigh, initial encounter: Secondary | ICD-10-CM | POA: Diagnosis not present

## 2015-08-05 DIAGNOSIS — I252 Old myocardial infarction: Secondary | ICD-10-CM | POA: Insufficient documentation

## 2015-08-05 DIAGNOSIS — S70361A Insect bite (nonvenomous), right thigh, initial encounter: Secondary | ICD-10-CM | POA: Diagnosis not present

## 2015-08-05 DIAGNOSIS — F17200 Nicotine dependence, unspecified, uncomplicated: Secondary | ICD-10-CM | POA: Insufficient documentation

## 2015-08-05 DIAGNOSIS — Z79899 Other long term (current) drug therapy: Secondary | ICD-10-CM | POA: Diagnosis not present

## 2015-08-05 DIAGNOSIS — I2510 Atherosclerotic heart disease of native coronary artery without angina pectoris: Secondary | ICD-10-CM | POA: Insufficient documentation

## 2015-08-05 DIAGNOSIS — W57XXXA Bitten or stung by nonvenomous insect and other nonvenomous arthropods, initial encounter: Secondary | ICD-10-CM | POA: Insufficient documentation

## 2015-08-05 DIAGNOSIS — Y939 Activity, unspecified: Secondary | ICD-10-CM | POA: Insufficient documentation

## 2015-08-05 DIAGNOSIS — E785 Hyperlipidemia, unspecified: Secondary | ICD-10-CM | POA: Diagnosis not present

## 2015-08-05 DIAGNOSIS — Y999 Unspecified external cause status: Secondary | ICD-10-CM | POA: Insufficient documentation

## 2015-08-05 DIAGNOSIS — Z7982 Long term (current) use of aspirin: Secondary | ICD-10-CM | POA: Diagnosis not present

## 2015-08-05 DIAGNOSIS — S80829A Blister (nonthermal), unspecified lower leg, initial encounter: Secondary | ICD-10-CM

## 2015-08-05 DIAGNOSIS — I10 Essential (primary) hypertension: Secondary | ICD-10-CM | POA: Diagnosis not present

## 2015-08-05 NOTE — ED Notes (Signed)
Pt noticed small area on upper lat rt thigh Friday, today quarter sized water filled blister. No redness or tenderness noted

## 2015-08-05 NOTE — Discharge Instructions (Signed)
Please carefully monitor the blister on your leg. If you notice redness, swelling, pain, fever, or thick white discharge please return to the ER.  Otherwise, please keep the blister covered to protect it and follow-up with your doctor in 1 week.

## 2015-08-05 NOTE — ED Provider Notes (Signed)
CSN: [id number]     Arrival date & time 08/05/15  20 History   First MD Initiated Contact with Patient 08/05/15 1626     Chief Complaint  Patient presents with  . Insect Bite     (Consider location/radiation/quality/duration/timing/severity/associated sxs/prior Treatment) HPI Comments: Patient with chief complaint of insect bite on right upper thigh.  Noticed a blister about 3 days ago.  Denies any fevers, chills, nausea or vomiting.  Denies any pain.  Has not tried taking anything for the symptoms.  There are no associated symptoms.  He did not see or feel anything bite him.  Denies any injuries.  The history is provided by the patient. No language interpreter was used.    Past Medical History  Diagnosis Date  . Severe aortic stenosis 2010    S/P AVR-TISSUE VALVE  . Coronary artery disease with hx of myocardial infarct w/o hx of CABG 2010  . Hypertension   . Hyperlipidemia   . BPH (benign prostatic hyperplasia)   . Impaired fasting glucose   . Diverticulosis   . 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 Memory loss   . Retinal tear   . Memory difficulty 10/19/2012  . HOH (hard of hearing)     hearing aids   Past Surgical History  Procedure Laterality Date  . Tonsillectomy    . Aortic valve replacement    . 2 vessel cabg    . Bartle    . Cataract extraction, bilateral      RT 2010 LT 2014   Family History  Problem Relation Age of Onset  . Stroke Father   . Cancer Brother   . Heart attack Neg Hx   . Hypertension Brother    Social History  Substance Use Topics  . Smoking status: Current Some Day Smoker    Types: Pipe  . Smokeless tobacco: Never Used     Comment: PIPE  . Alcohol Use: No     Comment: RARELY    Review of Systems  Skin:       blister  All other systems reviewed and are negative.     Allergies  Review of patient's allergies indicates no known allergies.  Home Medications   Prior to Admission medications   Medication Sig Start Date End Date  Taking? Authorizing Provider  amLODipine (NORVASC) 10 MG tablet Take 1 tablet (10 mg total) by mouth daily. 03/08/15   Jettie Booze, MD  amoxicillin-clavulanate (AUGMENTIN) 875-125 MG tablet Take 1 tablet by mouth every 12 (twelve) hours. 05/08/15   Orson Eva, MD  aspirin EC 81 MG tablet Take 81 mg by mouth daily.    Historical Provider, MD  atorvastatin (LIPITOR) 10 MG tablet Take 10 mg by mouth daily.    Historical Provider, MD  Multiple Vitamins-Minerals (MULTIVITAMIN WITH MINERALS) tablet Take 1 tablet by mouth daily.    Historical Provider, MD   BP 139/60 mmHg  Pulse 86  Temp(Src) 98 F (36.7 C) (Oral)  Resp 17  SpO2 100% Physical Exam  Constitutional: He is oriented to person, place, and time. He appears well-developed and well-nourished.  HENT:  Head: Normocephalic and atraumatic.  Eyes: Conjunctivae and EOM are normal.  Neck: Normal range of motion.  Cardiovascular: Normal rate and normal heart sounds.   Pulmonary/Chest: Effort normal. No respiratory distress. He has no wheezes. He has no rales. He exhibits no tenderness.  Abdominal: He exhibits no distension.  Musculoskeletal: Normal range of motion.  Neurological: He is alert and oriented to  person, place, and time.  Skin: Skin is dry.  2x2 cm vesicle of right upper thigh, clear fluid, intact, no surrounding erythema or evidence of infection  Psychiatric: He has a normal mood and affect. His behavior is normal. Judgment and thought content normal.  Nursing note and vitals reviewed.   ED Course  Procedures (including critical care time)   MDM   Final diagnoses:  Blister of leg    Vesicle on leg.  No sign of infection.  Will give bandage to protect the blister.  VSS.  Recommend PCP follow-up in 1 week.  Seen by and discussed with Dr. Jeneen Rinks, who agrees with plan.    Montine Circle, PA-C 08/05/15 [place], MD 08/13/15 [phone]

## 2015-08-13 DIAGNOSIS — R21 Rash and other nonspecific skin eruption: Secondary | ICD-10-CM | POA: Diagnosis not present

## 2015-09-19 ENCOUNTER — Ambulatory Visit (INDEPENDENT_AMBULATORY_CARE_PROVIDER_SITE_OTHER): Payer: PPO | Admitting: Podiatry

## 2015-09-19 ENCOUNTER — Encounter: Payer: Self-pay | Admitting: Podiatry

## 2015-09-19 DIAGNOSIS — B351 Tinea unguium: Secondary | ICD-10-CM | POA: Diagnosis not present

## 2015-09-19 DIAGNOSIS — M79676 Pain in unspecified toe(s): Secondary | ICD-10-CM | POA: Diagnosis not present

## 2015-09-19 NOTE — Progress Notes (Signed)
Patient ID: [name], male   DOB: 08/21/1926, 82 y.o.   MRN: [id number] Complaint:  Visit Type: Patient returns to my office for continued preventative foot care services. Complaint: Patient states" my nails have grown long and thick and become painful to walk and wear shoes" . The patient presents for preventative foot care services. No changes to ROS.  Patient has been hospitalized with pnneumonia and feel on his face last Friday.  Podiatric Exam: Vascular: dorsalis pedis and posterior tibial pulses are palpable bilateral. Capillary return is immediate. Temperature gradient is WNL. Skin turgor WNL  Sensorium: Normal Semmes Weinstein monofilament test. Normal tactile sensation bilaterally. Nail Exam: Pt has thick disfigured discolored nails with subungual debris noted bilateral entire nail hallux through fifth toenails Ulcer Exam: There is no evidence of ulcer or pre-ulcerative changes or infection. Orthopedic Exam: Muscle tone and strength are WNL. No limitations in general ROM. No crepitus or effusions noted. Foot type and digits show no abnormalities. Bony prominences are unremarkable. Skin: No Porokeratosis. No infection or ulcers  Diagnosis:  Onychomycosis, , Pain in right toe, pain in left toes  Treatment & Plan Procedures and Treatment: Consent by patient was obtained for treatment procedures. The patient understood the discussion of treatment and procedures well. All questions were answered thoroughly reviewed. Debridement of mycotic and hypertrophic toenails, 1 through 5 bilateral and clearing of subungual debris. No ulceration, no infection noted.  Return Visit-Office Procedure: Patient instructed to return to the office for a follow up visit 3 months for continued evaluation and treatment.    Gardiner Barefoot DPM

## 2015-11-02 DIAGNOSIS — Z23 Encounter for immunization: Secondary | ICD-10-CM | POA: Diagnosis not present

## 2015-11-02 DIAGNOSIS — E7800 Pure hypercholesterolemia, unspecified: Secondary | ICD-10-CM | POA: Diagnosis not present

## 2015-11-02 DIAGNOSIS — R7301 Impaired fasting glucose: Secondary | ICD-10-CM | POA: Diagnosis not present

## 2015-11-02 DIAGNOSIS — I10 Essential (primary) hypertension: Secondary | ICD-10-CM | POA: Diagnosis not present

## 2015-11-02 DIAGNOSIS — Z0000 Encounter for general adult medical examination without abnormal findings: Secondary | ICD-10-CM | POA: Diagnosis not present

## 2015-11-02 DIAGNOSIS — Z1389 Encounter for screening for other disorder: Secondary | ICD-10-CM | POA: Diagnosis not present

## 2015-11-02 DIAGNOSIS — I2510 Atherosclerotic heart disease of native coronary artery without angina pectoris: Secondary | ICD-10-CM | POA: Diagnosis not present

## 2015-11-02 DIAGNOSIS — R413 Other amnesia: Secondary | ICD-10-CM | POA: Diagnosis not present

## 2015-11-02 DIAGNOSIS — N179 Acute kidney failure, unspecified: Secondary | ICD-10-CM | POA: Diagnosis not present

## 2015-11-09 ENCOUNTER — Emergency Department (HOSPITAL_COMMUNITY)
Admission: EM | Admit: 2015-11-09 | Discharge: 2015-11-09 | Disposition: A | Discharge status: Home / Self Care | Payer: PPO | Attending: Emergency Medicine | Admitting: Emergency Medicine

## 2015-11-09 ENCOUNTER — Encounter (HOSPITAL_COMMUNITY): Payer: Self-pay

## 2015-11-09 DIAGNOSIS — Y92197 Garden or yard of other specified residential institution as the place of occurrence of the external cause: Secondary | ICD-10-CM | POA: Diagnosis not present

## 2015-11-09 DIAGNOSIS — S0003XA Contusion of scalp, initial encounter: Secondary | ICD-10-CM | POA: Diagnosis not present

## 2015-11-09 DIAGNOSIS — Z79899 Other long term (current) drug therapy: Secondary | ICD-10-CM | POA: Insufficient documentation

## 2015-11-09 DIAGNOSIS — W01198A Fall on same level from slipping, tripping and stumbling with subsequent striking against other object, initial encounter: Secondary | ICD-10-CM | POA: Diagnosis not present

## 2015-11-09 DIAGNOSIS — I10 Essential (primary) hypertension: Secondary | ICD-10-CM | POA: Insufficient documentation

## 2015-11-09 DIAGNOSIS — N179 Acute kidney failure, unspecified: Secondary | ICD-10-CM | POA: Diagnosis not present

## 2015-11-09 DIAGNOSIS — S098XXA Other specified injuries of head, initial encounter: Secondary | ICD-10-CM | POA: Diagnosis not present

## 2015-11-09 DIAGNOSIS — S0101XA Laceration without foreign body of scalp, initial encounter: Secondary | ICD-10-CM | POA: Diagnosis not present

## 2015-11-09 DIAGNOSIS — Z7982 Long term (current) use of aspirin: Secondary | ICD-10-CM | POA: Diagnosis not present

## 2015-11-09 DIAGNOSIS — Y999 Unspecified external cause status: Secondary | ICD-10-CM | POA: Insufficient documentation

## 2015-11-09 DIAGNOSIS — Y939 Activity, unspecified: Secondary | ICD-10-CM | POA: Diagnosis not present

## 2015-11-09 DIAGNOSIS — F17200 Nicotine dependence, unspecified, uncomplicated: Secondary | ICD-10-CM | POA: Insufficient documentation

## 2015-11-09 DIAGNOSIS — S0180XA Unspecified open wound of other part of head, initial encounter: Secondary | ICD-10-CM | POA: Diagnosis not present

## 2015-11-09 MED ORDER — BACITRACIN ZINC 500 UNIT/GM EX OINT
TOPICAL_OINTMENT | CUTANEOUS | Status: AC
  Administered 2015-11-09: 2
  Filled 2015-11-09: qty 1.8

## 2015-11-09 NOTE — ED Notes (Signed)
Bed: [facility] Expected date:  Expected time:  Means of arrival:  Comments: EMS- 82yo M, fall/head lac

## 2015-11-09 NOTE — ED Notes (Signed)
PT DISCHARGED. INSTRUCTIONS GIVEN. AAOX4. PT IN NO APPARENT DISTRESS OR PAIN. THE OPPORTUNITY TO ASK QUESTIONS WAS PROVIDED. 

## 2015-11-09 NOTE — ED Provider Notes (Signed)
[facility] DEPT Provider Note   CSN: [id number] Arrival date & time: 11/09/15  1533     History   Chief Complaint Chief Complaint  Patient presents with  . Fall  . Head Laceration    HPI [name] is a 82 y.o. male.  The history is provided by the patient.  Fall  This is a new problem. The current episode started 1 to 2 hours ago. The problem occurs constantly. The problem has not changed since onset.Pertinent negatives include no headaches. Nothing aggravates the symptoms. Nothing relieves the symptoms. He has tried nothing for the symptoms.  Head Laceration  This is a new problem. The problem has not changed since onset.Pertinent negatives include no headaches. Nothing aggravates the symptoms. Nothing relieves the symptoms.    Past Medical History:  Diagnosis Date  . BPH (benign prostatic hyperplasia)   . Coronary artery disease with hx of myocardial infarct w/o hx of CABG 2010  . Diverticulosis   . HOH (hard of hearing)    hearing aids  . Hyperlipidemia   . Hypertension   . Impaired fasting glucose   . Memory difficulty 10/19/2012  . Memory loss   . Mesenteric panniculitis ([place])   . Retinal tear   . Severe aortic stenosis 2010   S/P AVR-TISSUE VALVE    Patient Active Problem List   Diagnosis Date Noted  . Pleural effusion, left   . Pleural effusion   . Empyema, left ([place]) 05/04/2015  . Empyema ([place])   . CAP (community acquired pneumonia) 04/29/2015  . Sepsis ([place]) 04/29/2015  . Dyspnea 04/29/2015  . Acute kidney injury ([place]) 04/29/2015  . Tobacco abuse 04/29/2015  . Bradycardia 03/08/2015  . Coronary atherosclerosis of native coronary artery 01/25/2013  . Aortic valve disorder 01/25/2013  . Mixed hyperlipidemia 01/25/2013  . Essential hypertension, benign 01/25/2013  . Memory difficulty 10/19/2012    Past Surgical History:  Procedure Laterality Date  . 2 VESSEL CABG    . AORTIC VALVE REPLACEMENT    . BARTLE    . CATARACT EXTRACTION,  BILATERAL     RT 2010 LT 2014  . TONSILLECTOMY         Home Medications    Prior to Admission medications   Medication Sig Start Date End Date Taking? Authorizing Provider  amLODipine (NORVASC) 10 MG tablet Take 1 tablet (10 mg total) by mouth daily. 03/08/15  Yes Jettie Booze, MD  aspirin EC 81 MG tablet Take 81 mg by mouth daily.   Yes Historical Provider, MD  atorvastatin (LIPITOR) 10 MG tablet Take 10 mg by mouth daily.   Yes Historical Provider, MD  donepezil (ARICEPT) 5 MG tablet Take 5 mg by mouth at bedtime.  11/02/15  Yes Historical Provider, MD  Multiple Vitamins-Minerals (MULTIVITAMIN WITH MINERALS) tablet Take 1 tablet by mouth daily.   Yes Historical Provider, MD  amoxicillin-clavulanate (AUGMENTIN) 875-125 MG tablet Take 1 tablet by mouth every 12 (twelve) hours. Patient not taking: Reported on 11/09/2015 05/08/15   Orson Eva, MD  donepezil (ARICEPT) 10 MG tablet Take 10 mg by mouth at bedtime.  11/02/15   Historical Provider, MD    Family History Family History  Problem Relation Age of Onset  . Stroke Father   . Cancer Brother   . Hypertension Brother   . Heart attack Neg Hx     Social History Social History  Substance Use Topics  . Smoking status: Current Some Day Smoker    Types: Pipe  .  Smokeless tobacco: Never Used     Comment: PIPE  . Alcohol use No     Comment: RARELY     Allergies   Review of patient's allergies indicates no known allergies.   Review of Systems Review of Systems  Skin: Positive for wound.  Neurological: Negative for syncope, speech difficulty, weakness, light-headedness and headaches.  All other systems reviewed and are negative.    Physical Exam Updated Vital Signs BP (!) 130/51 (BP Location: Right Arm)   Pulse 74   Temp 98.1 F (36.7 C) (Oral)   Resp 16   Ht 5\' 7"  (1.702 m)   Wt 155 lb (70.3 kg)   SpO2 100%   BMI 24.28 kg/m   Physical Exam  Constitutional: He is oriented to person, place, and time. He  appears well-developed and well-nourished. No distress.  HENT:  Head: Normocephalic. Head is with laceration (2 cm with underlying 4 cm hematoma).  Nose: Nose normal.  Eyes: Conjunctivae are normal.  Neck: Neck supple. No tracheal deviation present.  Cardiovascular: Normal rate and regular rhythm.   Pulmonary/Chest: Effort normal. No respiratory distress.  Abdominal: Soft. He exhibits no distension.  Neurological: He is alert and oriented to person, place, and time. He has normal strength. No cranial nerve deficit. Coordination and gait normal. GCS eye subscore is 4. GCS verbal subscore is 5. GCS motor subscore is 6.  Skin: Skin is warm and dry.  Psychiatric: He has a normal mood and affect.     ED Treatments / Results  Labs (all labs ordered are listed, but only abnormal results are displayed) Labs Reviewed - No data to display  EKG  EKG Interpretation None       Radiology No results found.  Procedures Procedures (including critical care time)  LACERATION REPAIR Performed by: Leo Grosser Authorized by: Leo Grosser Consent: Verbal consent obtained. Risks and benefits: risks, benefits and alternatives were discussed Consent given by: patient Patient identity confirmed: provided demographic data Prepped and Draped in normal sterile fashion Wound explored  Laceration Location: superior posterior scalp  Laceration Length: 2 cm  No Foreign Bodies seen or palpated  Anesthesia: local infiltration  Local anesthetic: none  Irrigation method: syringe Amount of cleaning: standard  Skin closure: staples  Number of sutures: 3  Technique: simple  Patient tolerance: Patient tolerated the procedure well with no immediate complications.   Medications Ordered in ED Medications - No data to display   Initial Impression / Assessment and Plan / ED Course  I have reviewed the triage vital signs and the nursing notes.  Pertinent labs & imaging results that were  available during my care of the patient were reviewed by me and considered in my medical decision making (see chart for details).  Clinical Course    82 y.o. male presents with tripping and falling backward striking the backof his head without LOC, no preceding symptoms. Not anticoagulated. Considering pt's GCS of 15, lack of evidence of skull base fracture, and lack of vomiting or altered mental status, I do not believe that further imaging is warranted in this pt.  Pt has no neurologic deficit.  Laceration was thoroughly irrigated and repaired without complication.  Pt is to follow up with healthcare provider in ten days for removal of staples.  Proper wound management counseling was administered.  Parents express understanding.   Final Clinical Impressions(s) / ED Diagnoses   Final diagnoses:  Hematoma of scalp, initial encounter  Laceration of scalp, initial encounter  New Prescriptions New Prescriptions   No medications on file     Leo Grosser, MD 11/09/15 1635

## 2015-11-09 NOTE — ED Triage Notes (Signed)
PT RECEIVED FROM HOME VIA EMS FOR A FALL. PER EMS, THE PT WAS STANDING IN THE NEIGHBOR'S YARD WATCHING THE ROOF BEING REPAIRED WHEN HE LOST HIS BALANCE AND FELL BACKWARDS, HITTING HIS HEAD ON THE CONCRETE. PT SUSTAINED A 1 INCH LACERATION TO THE BACK OF HIS HEAD. MINIMAL BLEEDING. PT DENIES LOC, DIZZINESS, OR ANTICOAGULANT THERAPY. AAOX4.

## 2015-11-19 DIAGNOSIS — W19XXXD Unspecified fall, subsequent encounter: Secondary | ICD-10-CM | POA: Diagnosis not present

## 2015-11-19 DIAGNOSIS — S0101XA Laceration without foreign body of scalp, initial encounter: Secondary | ICD-10-CM | POA: Diagnosis not present

## 2015-11-28 ENCOUNTER — Ambulatory Visit (INDEPENDENT_AMBULATORY_CARE_PROVIDER_SITE_OTHER): Payer: PPO | Admitting: Podiatry

## 2015-11-28 ENCOUNTER — Encounter: Payer: Self-pay | Admitting: Podiatry

## 2015-11-28 VITALS — Ht 67.0 in | Wt 152.0 lb

## 2015-11-28 DIAGNOSIS — M79676 Pain in unspecified toe(s): Secondary | ICD-10-CM | POA: Diagnosis not present

## 2015-11-28 DIAGNOSIS — B351 Tinea unguium: Secondary | ICD-10-CM | POA: Diagnosis not present

## 2015-11-28 NOTE — Progress Notes (Signed)
Patient ID: [name], male   DOB: 09-22-1926, 81 y.o.   MRN: [id number] Complaint:  Visit Type: Patient returns to my office for continued preventative foot care services. Complaint: Patient states" my nails have grown long and thick and become painful to walk and wear shoes" . The patient presents for preventative foot care services. No changes to ROS.  Patient has been hospitalized with pnneumonia and feel on his face last Friday.  Podiatric Exam: Vascular: dorsalis pedis and posterior tibial pulses are palpable bilateral. Capillary return is immediate. Temperature gradient is WNL. Skin turgor WNL  Sensorium: Normal Semmes Weinstein monofilament test. Normal tactile sensation bilaterally. Nail Exam: Pt has thick disfigured discolored nails with subungual debris noted bilateral entire nail hallux through fifth toenails Ulcer Exam: There is no evidence of ulcer or pre-ulcerative changes or infection. Orthopedic Exam: Muscle tone and strength are WNL. No limitations in general ROM. No crepitus or effusions noted. Foot type and digits show no abnormalities. Bony prominences are unremarkable. Skin: No Porokeratosis. No infection or ulcers  Diagnosis:  Onychomycosis, , Pain in right toe, pain in left toes  Treatment & Plan Procedures and Treatment: Consent by patient was obtained for treatment procedures. The patient understood the discussion of treatment and procedures well. All questions were answered thoroughly reviewed. Debridement of mycotic and hypertrophic toenails, 1 through 5 bilateral and clearing of subungual debris. No ulceration, no infection noted.  Return Visit-Office Procedure: Patient instructed to return to the office for a follow up visit 3 months for continued evaluation and treatment.    Gardiner Barefoot DPM

## 2015-12-26 ENCOUNTER — Ambulatory Visit (INDEPENDENT_AMBULATORY_CARE_PROVIDER_SITE_OTHER): Payer: PPO | Admitting: Neurology

## 2015-12-26 ENCOUNTER — Encounter: Payer: Self-pay | Admitting: Neurology

## 2015-12-26 VITALS — BP 148/64 | HR 76 | Ht 67.5 in | Wt 164.0 lb

## 2015-12-26 DIAGNOSIS — R413 Other amnesia: Secondary | ICD-10-CM

## 2015-12-26 NOTE — Progress Notes (Signed)
Reason for visit:   Memory disorder  [name] is an 82 y.o. male  History of present illness:   [name] is an 82 year old right-handed white male with a history of a mild memory disorder. The patient has recently been placed on Aricept through his primary care physician, he currently is on 10 mg a day, he is tolerating the medication well. He has had 2 falls since last seen, one on May 26, and a second on October 20. The patient has tried to increase his fluid intake, he feels better doing this and he is not dizzy when he stands up at this point. The patient has been concerned about the progressive weakness with muscular dystrophy with his wife, he is trying to plan ahead for the future in terms of their living situation. The patient still operates a motor vehicle, he is doing well with this. He returns for an evaluation.  Past Medical History:  Diagnosis Date  . BPH (benign prostatic hyperplasia)   . Coronary artery disease with hx of myocardial infarct w/o hx of CABG 2010  . Dehydration   . Diverticulosis   . HOH (hard of hearing)    hearing aids  . Hyperlipidemia   . Hypertension   . Impaired fasting glucose   . Memory difficulty 10/19/2012  . Memory loss   . 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 Retinal tear   . Severe aortic stenosis 2010   S/P AVR-TISSUE VALVE    Past Surgical History:  Procedure Laterality Date  . 2 VESSEL CABG    . AORTIC VALVE REPLACEMENT    . BARTLE    . CATARACT EXTRACTION, BILATERAL     RT 2010 LT 2014  . TONSILLECTOMY      Family History  Problem Relation Age of Onset  . Stroke Father   . Cancer Brother   . Hypertension Brother   . Heart attack Neg Hx     Social history:  reports that he has been smoking Pipe.  He has never used smokeless tobacco. He reports that he does not drink alcohol or use drugs.   No Known Allergies  Medications:  Prior to Admission medications   Medication Sig Start Date End Date Taking? Authorizing  Provider  amLODipine (NORVASC) 10 MG tablet Take 1 tablet (10 mg total) by mouth daily. Patient taking differently: Take 5 mg by mouth daily.  03/08/15  Yes Jettie Booze, MD  amoxicillin-clavulanate (AUGMENTIN) 875-125 MG tablet Take 1 tablet by mouth every 12 (twelve) hours. 05/08/15  Yes Orson Eva, MD  aspirin EC 81 MG tablet Take 81 mg by mouth daily.   Yes Historical Provider, MD  atorvastatin (LIPITOR) 10 MG tablet Take 10 mg by mouth daily.   Yes Historical Provider, MD  donepezil (ARICEPT) 10 MG tablet Take 10 mg by mouth at bedtime.  11/02/15  Yes Historical Provider, MD  Multiple Vitamins-Minerals (MULTIVITAMIN WITH MINERALS) tablet Take 1 tablet by mouth daily.   Yes Historical Provider, MD    ROS:  Out of a complete 14 system review of symptoms, the patient complains only of the following symptoms, and all other reviewed systems are negative.   Falling  Anxiety  Drooling, runny nose  Blood pressure (!) 148/64, pulse 76, height 5' 7.5" (1.715 m), weight 164 lb (74.4 kg).  Physical Exam  General: The patient is alert and cooperative at the time of the examination.  Skin: No significant peripheral edema is noted.   Neurologic  Exam  Mental status: The patient is alert and oriented x 3 at the time of the examination. The patient has apparent normal recent and remote memory, with an apparently normal attention span and concentration ability. Mini-Mental status examination done today shows a total score of 30/30.   Cranial nerves: Facial symmetry is present. Speech is normal, no aphasia or dysarthria is noted. Extraocular movements are full. Visual fields are full.  Motor: The patient has good strength in all 4 extremities.  Sensory examination: Soft touch sensation is symmetric on the face, arms, and legs.  Coordination: The patient has good finger-nose-finger and heel-to-shin bilaterally.  Gait and station: The patient has a normal gait. Tandem gait is normal.  Romberg is negative. No drift is seen.  Reflexes: Deep tendon reflexes are symmetric.   Assessment/Plan:   1. Mild memory disturbance   The patient is doing quite well at this time, he is on Aricept, he is tolerating the drug well. We will follow the patient on a conservative basis, he will see [place] back in about 6 months.  Jill Alexanders MD 12/26/2015 2:32 PM  [facility] [address] [place], [place] [address]  Phone [phone] Fax [phone]

## 2016-01-30 ENCOUNTER — Ambulatory Visit: Payer: PPO | Admitting: Podiatry

## 2016-02-27 ENCOUNTER — Encounter: Payer: Self-pay | Admitting: Podiatry

## 2016-02-27 ENCOUNTER — Ambulatory Visit (INDEPENDENT_AMBULATORY_CARE_PROVIDER_SITE_OTHER): Payer: PPO | Admitting: Podiatry

## 2016-02-27 VITALS — Ht 67.5 in | Wt 164.0 lb

## 2016-02-27 DIAGNOSIS — M79676 Pain in unspecified toe(s): Secondary | ICD-10-CM

## 2016-02-27 DIAGNOSIS — B351 Tinea unguium: Secondary | ICD-10-CM | POA: Diagnosis not present

## 2016-02-27 NOTE — Progress Notes (Signed)
Patient ID: [name], male   DOB: [birth year], 83 y.o.   MRN: [id number] Complaint:  Visit Type: Patient returns to my office for continued preventative foot care services. Complaint: Patient states" my nails have grown long and thick and become painful to walk and wear shoes" . The patient presents for preventative foot care services. No changes to ROS.  Patient has been hospitalized with pnneumonia and feel on his face last Friday.  Podiatric Exam: Vascular: dorsalis pedis and posterior tibial pulses are palpable bilateral. Capillary return is immediate. Temperature gradient is WNL. Skin turgor WNL  Sensorium: Normal Semmes Weinstein monofilament test. Normal tactile sensation bilaterally. Nail Exam: Pt has thick disfigured discolored nails with subungual debris noted bilateral entire nail hallux through fifth toenails Ulcer Exam: There is no evidence of ulcer or pre-ulcerative changes or infection. Orthopedic Exam: Muscle tone and strength are WNL. No limitations in general ROM. No crepitus or effusions noted. Foot type and digits show no abnormalities. Bony prominences are unremarkable. Skin: No Porokeratosis. No infection or ulcers  Diagnosis:  Onychomycosis, , Pain in right toe, pain in left toes  Treatment & Plan Procedures and Treatment: Consent by patient was obtained for treatment procedures. The patient understood the discussion of treatment and procedures well. All questions were answered thoroughly reviewed. Debridement of mycotic and hypertrophic toenails, 1 through 5 bilateral and clearing of subungual debris. No ulceration, no infection noted.  Return Visit-Office Procedure: Patient instructed to return to the office for a follow up visit 3 months for continued evaluation and treatment.    Gardiner Barefoot DPM

## 2016-03-12 ENCOUNTER — Telehealth: Payer: Self-pay | Admitting: Interventional Cardiology

## 2016-03-12 NOTE — Telephone Encounter (Signed)
Pt is calling requesting a refill on Amoxicillin, stating that he has an dentist appointment tomorrow and would like to pick it up today. Please advise

## 2016-03-13 MED ORDER — AMOXICILLIN 500 MG PO TABS: ORAL_TABLET | ORAL | 0 refills | Status: DC

## 2016-03-13 NOTE — Telephone Encounter (Signed)
The pt is advised that he needs to take Amoxicillin 500 mg-4 tablets one hour prior to his dental appt today. He verbalized understanding. Per his request I have sent his Amoxicillin RX to [facility] on [address] to fill.

## 2016-03-14 ENCOUNTER — Encounter: Payer: Self-pay | Admitting: Interventional Cardiology

## 2016-03-14 ENCOUNTER — Encounter (INDEPENDENT_AMBULATORY_CARE_PROVIDER_SITE_OTHER): Payer: Self-pay

## 2016-03-14 ENCOUNTER — Ambulatory Visit (INDEPENDENT_AMBULATORY_CARE_PROVIDER_SITE_OTHER): Payer: Medicare Other | Admitting: Interventional Cardiology

## 2016-03-14 VITALS — BP 158/62 | HR 66 | Ht 67.5 in | Wt 162.8 lb

## 2016-03-14 DIAGNOSIS — I10 Essential (primary) hypertension: Secondary | ICD-10-CM

## 2016-03-14 DIAGNOSIS — I2510 Atherosclerotic heart disease of native coronary artery without angina pectoris: Secondary | ICD-10-CM | POA: Diagnosis not present

## 2016-03-14 DIAGNOSIS — W19XXXD Unspecified fall, subsequent encounter: Secondary | ICD-10-CM | POA: Diagnosis not present

## 2016-03-14 DIAGNOSIS — I359 Nonrheumatic aortic valve disorder, unspecified: Secondary | ICD-10-CM

## 2016-03-14 NOTE — Progress Notes (Signed)
Patient ID: [name], male   DOB: 10/19/1926, 83 y.o.   MRN: [id number]     Cardiology Office Note   Date:  03/14/2016   ID:  [name], DOB [birth year], MRN [id number]  PCP:  Irven Shelling, MD    No chief complaint on file. f/u CAD/AVR   Wt Readings from Last 3 Encounters:  03/14/16 162 lb 12.8 oz (73.8 kg)  02/27/16 164 lb (74.4 kg)  12/26/15 164 lb (74.4 kg)       History of Present Illness: [name] is a 83 y.o. male  who had an AVR/CABG in 2010. Blood preesure at the [facility] is usually in the A999333 range systolic. THis is higher than before.   He exercises and gets his HR to the 100 range. His daughter was diagnosed with lung cancer several years ago and there is some stress with that.  She is still being treated.    Sons have muscular dystrophy, one in [place] and one in [place].    CAD/ASCVD:  plays 9 holes of golf when the weather cooperates; [facility].  Denies : Chest pain. Diaphoresis. Dyspnea on exertion. Leg edema. Nitroglycerin. Orthopnea. Palpitations.  Paroxysmal nocturnal dyspnea.     Takes antibiotic before dental work.  Had a tooth pulled this morning.    He has been feeling well and exercising several times a week except for a few falls in 8/17 and 10/17.  Went to ER in 10/17.  He had not been drinking a lot of water and was diagnosed with dehydration.  After staying hydrated, no problems.  BPs at home are in the A999333 range systolic.      Past Medical History:  Diagnosis Date  . BPH (benign prostatic hyperplasia)   . Coronary artery disease with hx of myocardial infarct w/o hx of CABG 2010  . Dehydration   . Diverticulosis   . HOH (hard of hearing)    hearing aids  . Hyperlipidemia   . Hypertension   . Impaired fasting glucose   . Memory difficulty 10/19/2012  . Memory loss   . 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 Retinal tear   . Severe aortic stenosis 2010   S/P AVR-TISSUE  VALVE    Past Surgical History:  Procedure Laterality Date  . 2 VESSEL CABG    . AORTIC VALVE REPLACEMENT    . BARTLE    . CATARACT EXTRACTION, BILATERAL     RT 2010 LT 2014  . TONSILLECTOMY       Current Outpatient Prescriptions  Medication Sig Dispense Refill  . amLODipine (NORVASC) 10 MG tablet Take 1 tablet (10 mg total) by mouth daily. (Patient taking differently: Take 5 mg by mouth daily. ) 90 tablet 3  . amoxicillin (AMOXIL) 500 MG tablet Take 4 tablets one hour prior to dental procedure. 4 tablet 0  . aspirin EC 81 MG tablet Take 81 mg by mouth daily.    Marland Kitchen atorvastatin (LIPITOR) 10 MG tablet Take 10 mg by mouth daily.    Marland Kitchen donepezil (ARICEPT) 10 MG tablet Take 10 mg by mouth at bedtime.     . Multiple Vitamins-Minerals (MULTIVITAMIN WITH MINERALS) tablet Take 1 tablet by mouth daily.     No current facility-administered medications for this visit.     Allergies:   Patient has no known allergies.    Social History:  The patient  reports that he has been smoking Pipe.  He  has never used smokeless tobacco. He reports that he does not drink alcohol or use drugs.   Family History:  The patient's family history includes Cancer in his brother; Hypertension in his brother; Stroke in his father.    ROS:  Please see the history of present illness.   Otherwise, review of systems are positive for falls as described.   All other systems are reviewed and negative.    PHYSICAL EXAM: VS:  BP (!) 158/62 (BP Location: Left Arm, Patient Position: Sitting, Cuff Size: Normal)   Pulse 66   Ht 5' 7.5" (1.715 m)   Wt 162 lb 12.8 oz (73.8 kg)   SpO2 98%   BMI 25.12 kg/m  , BMI Body mass index is 25.12 kg/m. GEN: Well nourished, well developed, in no acute distress  HEENT: normal  Neck: no JVD, carotid bruits, or masses Cardiac: RRR; 2./6 early systolic murmurs,no rubs, or gallops,no edema  Respiratory:  clear to auscultation bilaterally, normal work of breathing GI: soft, nontender,  nondistended, + BS MS: no deformity or atrophy  Skin: warm and dry, no rash Neuro:  Strength and sensation are intact Psych: euthymic mood, full affect   EKG:   The ekg ordered today demonstrates sinus bradycardia, no signficant ST segment changes   Recent Labs: 04/29/2015: TSH 1.373 05/10/2015: ALT 73; BUN 16; Creatinine 1.0; Potassium 4.5; Sodium 137 05/14/2015: Hemoglobin 9.9; Platelets 602   Lipid Panel No results found for: CHOL, TRIG, HDL, CHOLHDL, VLDL, LDLCALC, LDLDIRECT   Other studies Reviewed: Additional studies/ records that were reviewed today with results demonstrating: prior cath and operative reslts noted; bioprosthetic AVR.   ASSESSMENT AND PLAN:  1. CAD: Status post bypass surgery. No angina. Continue regular exercise. Continue aggressive secondary prevention.  No NTG use. 2. AVR: Doing well many years after bioprosthetic aortic valve placement. Continue with SBE prophylaxis for dental visits.  He took ABx today with his tooth pull.  3. HTN:  Blood pressure well controlled at home.  Continue current meds.  I personally reviewed his home readings.   4. Bradycardia:   stopped metoprolol.  HR stable today. 5. Hyperlipidemia: COntinue atorvastatin.  Checked with Dr. Laurann Montana.  Stay well hydrated.  Important to try to avoid falls.    Current medicines are reviewed at length with the patient today.  The patient concerns regarding his medicines were addressed.  The following changes have been made:    Labs/ tests ordered today include:  No orders of the defined types were placed in this encounter.   Recommend 150 minutes/week of aerobic exercise Low fat, low carb, high fiber diet recommended  Disposition:   FU in one year   Signed, Larae Grooms, MD  03/14/2016 3:00 PM    [facility] [place], [place], [place]  [address] Phone: [phone]; Fax: [phone]

## 2016-03-14 NOTE — Patient Instructions (Signed)

## 2016-05-22 ENCOUNTER — Ambulatory Visit: Payer: Medicare Other | Admitting: Podiatry

## 2016-06-25 ENCOUNTER — Ambulatory Visit: Payer: PPO | Admitting: Neurology

## 2016-06-26 ENCOUNTER — Ambulatory Visit: Payer: Medicare Other | Admitting: Podiatry

## 2016-07-24 ENCOUNTER — Ambulatory Visit: Payer: Medicare Other | Admitting: Podiatry

## 2016-08-28 ENCOUNTER — Encounter: Payer: Self-pay | Admitting: Podiatry

## 2016-08-28 ENCOUNTER — Ambulatory Visit (INDEPENDENT_AMBULATORY_CARE_PROVIDER_SITE_OTHER): Payer: Medicare Other | Admitting: Podiatry

## 2016-08-28 DIAGNOSIS — B351 Tinea unguium: Secondary | ICD-10-CM

## 2016-08-28 DIAGNOSIS — M79676 Pain in unspecified toe(s): Secondary | ICD-10-CM

## 2016-08-28 NOTE — Progress Notes (Signed)
Patient ID: [name], male   DOB: 1927/01/15, 83 y.o.   MRN: [id number] Complaint:  Visit Type: Patient returns to my office for continued preventative foot care services. Complaint: Patient states" my nails have grown long and thick and become painful to walk and wear shoes" . The patient presents for preventative foot care services.   Podiatric Exam: Vascular: dorsalis pedis and posterior tibial pulses are palpable bilateral. Capillary return is immediate. Temperature gradient is WNL. Skin turgor WNL  Sensorium: Normal Semmes Weinstein monofilament test. Normal tactile sensation bilaterally. Nail Exam: Pt has thick disfigured discolored nails with subungual debris noted bilateral entire nail hallux through fifth toenails Ulcer Exam: There is no evidence of ulcer or pre-ulcerative changes or infection. Orthopedic Exam: Muscle tone and strength are WNL. No limitations in general ROM. No crepitus or effusions noted. Foot type and digits show no abnormalities. Bony prominences are unremarkable. Skin: No Porokeratosis. No infection or ulcers  Diagnosis:  Onychomycosis, , Pain in right toe, pain in left toes  Treatment & Plan Procedures and Treatment: Consent by patient was obtained for treatment procedures. The patient understood the discussion of treatment and procedures well. All questions were answered thoroughly reviewed. Debridement of mycotic and hypertrophic toenails, 1 through 5 bilateral and clearing of subungual debris. No ulceration, no infection noted.  Return Visit-Office Procedure: Patient instructed to return to the office for a follow up visit 3 months for continued evaluation and treatment.    Gardiner Barefoot DPM

## 2016-09-19 IMAGING — CR DG CHEST 2V
2 series · 2 of 2 positions shown · non-contrast
Comparison: 05/14/2015

CLINICAL DATA: Followup pneumonia and LEFT empyema, hypertension,
coronary disease post CABG and MI, smoker

EXAM:
CHEST  2 VIEW

[w chest pa]
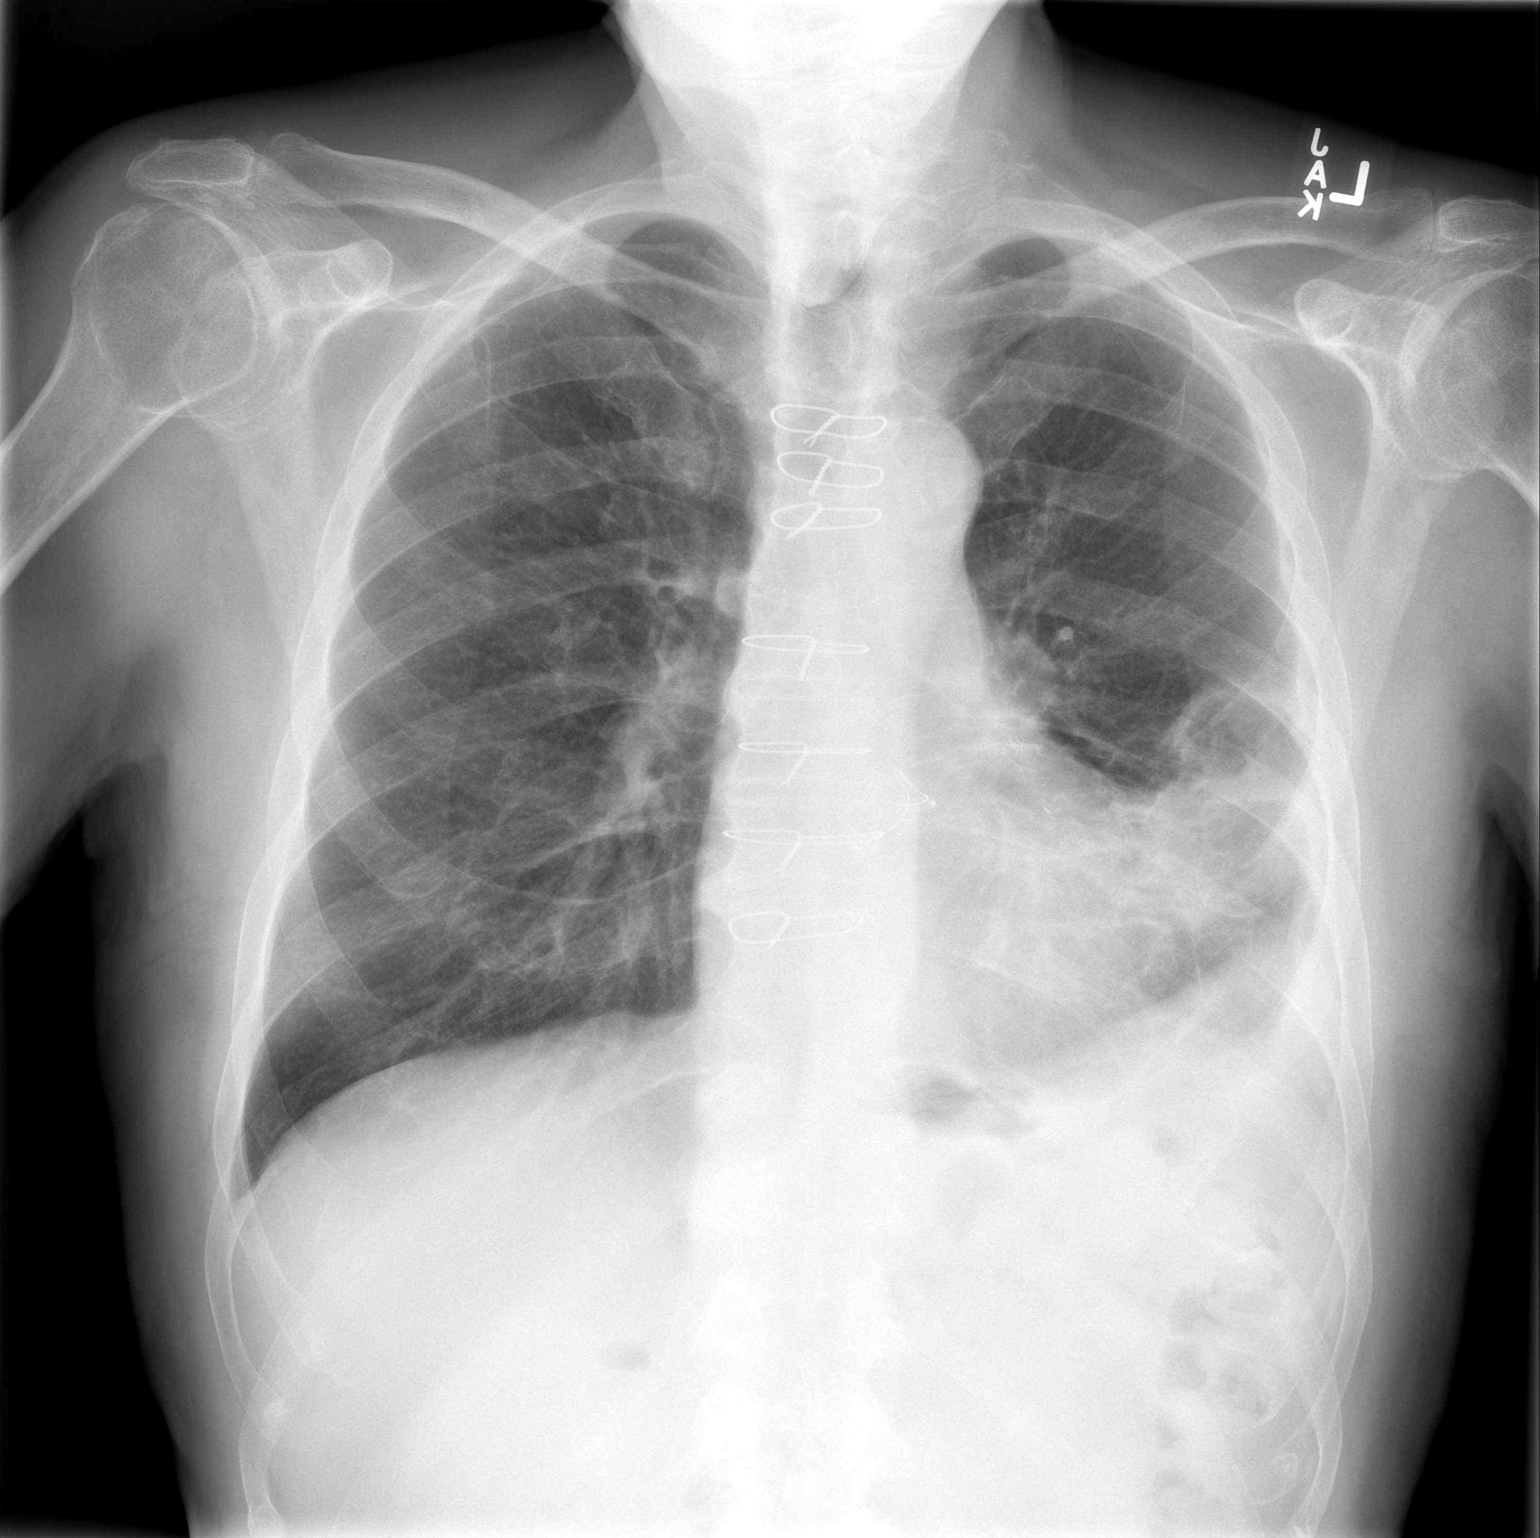

[w chest lat]
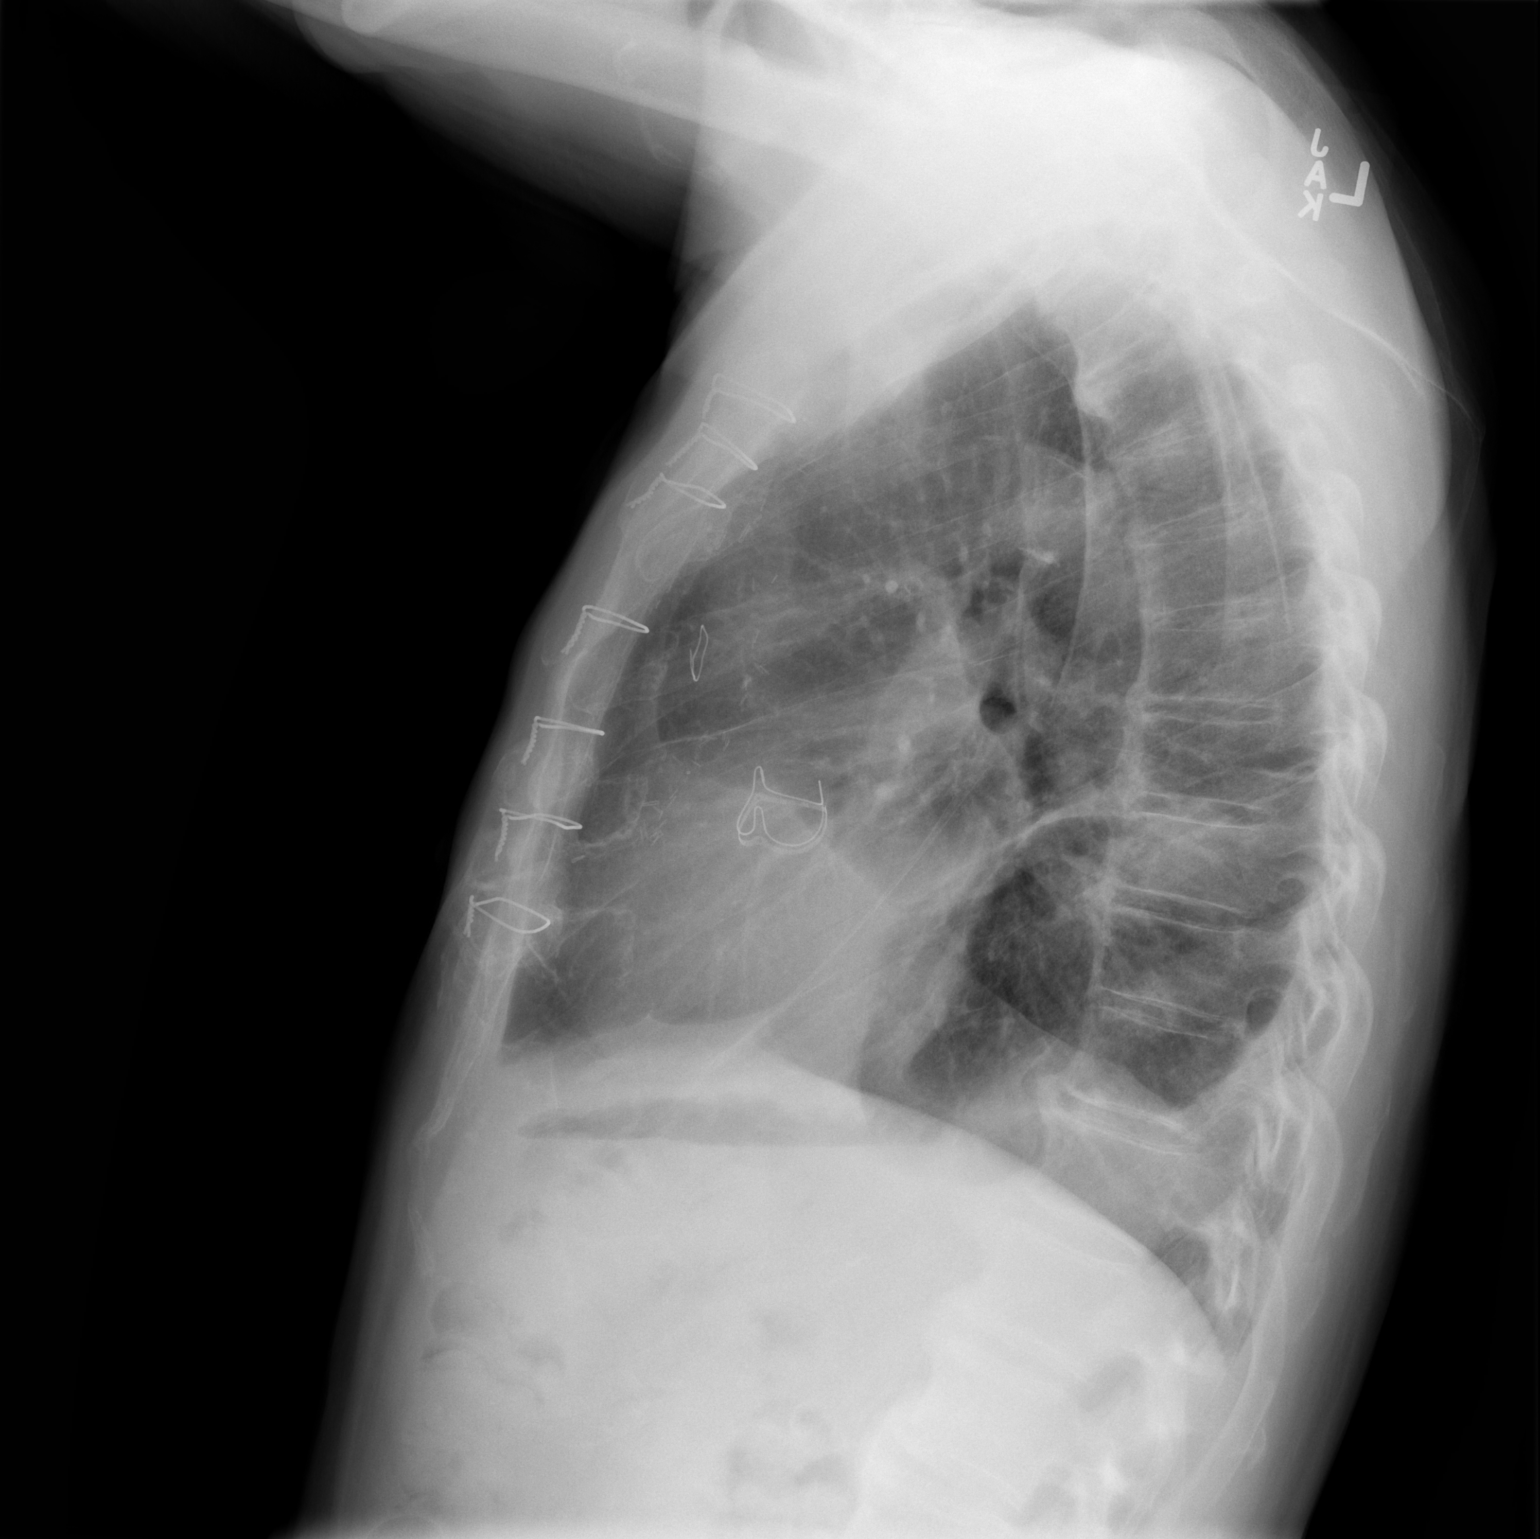

[2 of 2 positions shown; findings below may reference images not displayed]

FINDINGS: Normal heart size post AVR and CABG.

Mediastinal contours and pulmonary vascularity normal.

Atherosclerotic calcification at aortic arch.

Decreased pleural effusion versus pleural thickening at the lateral
LEFT lung base versus previous exam.

Decreased LEFT lower lobe atelectasis and opacification.

Remaining lungs clear.

No pneumothorax.

Bones unremarkable.
IMPRESSION: Decreased LEFT pleural fluid versus pleural thickening as well as
LEFT lung base opacity since previous exam.

No new abnormalities.

## 2016-11-25 ENCOUNTER — Ambulatory Visit: Payer: Medicare Other | Admitting: Podiatry

## 2017-01-28 ENCOUNTER — Ambulatory Visit: Payer: Medicare Other | Admitting: Podiatry

## 2017-03-03 DIAGNOSIS — S0031XA Abrasion of nose, initial encounter: Secondary | ICD-10-CM | POA: Diagnosis not present

## 2017-03-03 DIAGNOSIS — S022XXB Fracture of nasal bones, initial encounter for open fracture: Secondary | ICD-10-CM | POA: Diagnosis not present

## 2017-03-10 DIAGNOSIS — S022XXA Fracture of nasal bones, initial encounter for closed fracture: Secondary | ICD-10-CM | POA: Diagnosis not present

## 2017-03-10 DIAGNOSIS — H6122 Impacted cerumen, left ear: Secondary | ICD-10-CM | POA: Diagnosis not present

## 2017-03-10 DIAGNOSIS — J342 Deviated nasal septum: Secondary | ICD-10-CM | POA: Diagnosis not present

## 2017-03-23 ENCOUNTER — Encounter: Payer: Self-pay | Admitting: Interventional Cardiology

## 2017-03-23 ENCOUNTER — Ambulatory Visit (INDEPENDENT_AMBULATORY_CARE_PROVIDER_SITE_OTHER): Payer: PPO | Admitting: Interventional Cardiology

## 2017-03-23 VITALS — BP 148/68 | HR 66 | Ht 67.5 in | Wt 158.6 lb

## 2017-03-23 DIAGNOSIS — I2510 Atherosclerotic heart disease of native coronary artery without angina pectoris: Secondary | ICD-10-CM

## 2017-03-23 DIAGNOSIS — E782 Mixed hyperlipidemia: Secondary | ICD-10-CM | POA: Diagnosis not present

## 2017-03-23 DIAGNOSIS — R001 Bradycardia, unspecified: Secondary | ICD-10-CM | POA: Diagnosis not present

## 2017-03-23 DIAGNOSIS — I10 Essential (primary) hypertension: Secondary | ICD-10-CM

## 2017-03-23 DIAGNOSIS — I359 Nonrheumatic aortic valve disorder, unspecified: Secondary | ICD-10-CM | POA: Diagnosis not present

## 2017-03-23 NOTE — Progress Notes (Signed)
Cardiology Office Note   Date:  03/23/2017   ID:  [name], DOB [birth year], MRN [id number]  PCP:  Lavone Orn, MD    No chief complaint on file. CAD   Wt Readings from Last 3 Encounters:  03/23/17 158 lb 9.6 oz (71.9 kg)  03/14/16 162 lb 12.8 oz (73.8 kg)  02/27/16 164 lb (74.4 kg)       History of Present Illness: [name] is a 84 y.o. male  who had an AVR/CABG in 2010 (tissue valve).  His daughter was diagnosed with lung cancer several years ago and this caused stress.  She passed away form lung cancer in September 2018.     Sons have muscular dystrophy, one in [place] and one in [place].   Denies : Chest pain. Dizziness. Leg edema. Nitroglycerin use. Orthopnea. Palpitations. Paroxysmal nocturnal dyspnea. Shortness of breath. Syncope.   One fall recently when he slipped.  No severe injury.  No recent dizziness.     He exercises 3x/week.  He walks and stretches.    Wife has muscular dystrophy and may need assisted living.     Past Medical History:  Diagnosis Date  . BPH (benign prostatic hyperplasia)   . Coronary artery disease with hx of myocardial infarct w/o hx of CABG 2010  . Dehydration   . Diverticulosis   . HOH (hard of hearing)    hearing aids  . Hyperlipidemia   . Hypertension   . Impaired fasting glucose   . Memory difficulty 10/19/2012  . Memory loss   . 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 Retinal tear   . Severe aortic stenosis 2010   S/P AVR-TISSUE VALVE    Past Surgical History:  Procedure Laterality Date  . 2 VESSEL CABG    . AORTIC VALVE REPLACEMENT    . BARTLE    . CATARACT EXTRACTION, BILATERAL     RT 2010 LT 2014  . TONSILLECTOMY       Current Outpatient Medications  Medication Sig Dispense Refill  . amLODipine (NORVASC) 10 MG tablet Take 5 mg by mouth daily.    Marland Kitchen amoxicillin (AMOXIL) 500 MG tablet Take 4 tablets one hour prior to dental procedure. 4 tablet 0  . aspirin EC 81 MG tablet Take 81 mg by mouth  daily.    Marland Kitchen atorvastatin (LIPITOR) 10 MG tablet Take 10 mg by mouth daily.    . Multiple Vitamins-Minerals (MULTIVITAMIN WITH MINERALS) tablet Take 1 tablet by mouth daily.     No current facility-administered medications for this visit.     Allergies:   Patient has no known allergies.    Social History:  The patient  reports that he has been smoking pipe.  he has never used smokeless tobacco. He reports that he does not drink alcohol or use drugs.   Family History:  The patient's family history includes Cancer in his brother; Hypertension in his brother; Stroke in his father.    ROS:  Please see the history of present illness.   Otherwise, review of systems are positive for one fall.   All other systems are reviewed and negative.    PHYSICAL EXAM: VS:  BP (!) 148/68   Pulse 66   Ht 5' 7.5" (1.715 m)   Wt 158 lb 9.6 oz (71.9 kg)   SpO2 98%   BMI 24.47 kg/m  , BMI Body mass index is 24.47 kg/m. GEN: Well nourished, well developed, in no acute distress  HEENT: normal  Neck: no JVD, carotid bruits, or masses Cardiac: RRR; 2/6 systolic murmurs, rubs, or gallops,; tr pretibial edema  Respiratory:  clear to auscultation bilaterally, normal work of breathing GI: soft, nontender, nondistended, + BS MS: no deformity or atrophy  Skin: warm and dry, no rash Neuro:  Strength and sensation are intact Psych: euthymic mood, full affect   EKG:   The ekg ordered today demonstrates NSR, no ST changes   Recent Labs: No results found for requested labs within last 8760 hours.   Lipid Panel No results found for: CHOL, TRIG, HDL, CHOLHDL, VLDL, LDLCALC, LDLDIRECT   Other studies Reviewed: Additional studies/ records that were reviewed today with results demonstrating: EF 50% in 2010.   ASSESSMENT AND PLAN:  1. CAD: No angina.  COntinue aggressive secondary prevention.  S/p CABG. 2. AVR: Continue SBE prophylaxis.  3. HTN: COntinue current meds.  Have to be careful with trying for  too low a BP given his syncope.  Home readings are in the range of 120-140/55-65.  Being out of this range is rare. 4. Bradycardia: Not currently causing problems.  5. Hyperlipidemia: LDL 59 in 10/18. Continue atorvastatin.   Current medicines are reviewed at length with the patient today.  The patient concerns regarding his medicines were addressed.  The following changes have been made:  No change  Labs/ tests ordered today include:  No orders of the defined types were placed in this encounter.   Recommend 150 minutes/week of aerobic exercise Low fat, low carb, high fiber diet recommended  Disposition:   FU in 1 year   Signed, Larae Grooms, MD  03/23/2017 3:32 PM    [facility] [place], [place], [place]  [address] Phone: [phone]; Fax: [phone]

## 2017-03-23 NOTE — Patient Instructions (Signed)

## 2017-03-24 DIAGNOSIS — S022XXB Fracture of nasal bones, initial encounter for open fracture: Secondary | ICD-10-CM | POA: Diagnosis not present

## 2017-05-05 DIAGNOSIS — H40013 Open angle with borderline findings, low risk, bilateral: Secondary | ICD-10-CM | POA: Diagnosis not present

## 2017-05-05 DIAGNOSIS — Z961 Presence of intraocular lens: Secondary | ICD-10-CM | POA: Diagnosis not present

## 2017-05-19 DIAGNOSIS — I10 Essential (primary) hypertension: Secondary | ICD-10-CM | POA: Diagnosis not present

## 2017-05-25 ENCOUNTER — Other Ambulatory Visit: Payer: Self-pay | Admitting: Interventional Cardiology

## 2017-05-26 NOTE — Telephone Encounter (Signed)
Pt's pharmacy is requesting a refill on amoxicillin. Would Dr. Varanasi like to refill this medication? Please address 

## 2017-05-26 NOTE — Telephone Encounter (Signed)
As stated in Dr. Hassell Done OV note, patient should continue with SBE prophylaxis. Okay to refill.

## 2017-09-08 DIAGNOSIS — L570 Actinic keratosis: Secondary | ICD-10-CM | POA: Diagnosis not present

## 2018-01-11 DIAGNOSIS — I10 Essential (primary) hypertension: Secondary | ICD-10-CM | POA: Diagnosis not present

## 2018-01-11 DIAGNOSIS — Z1389 Encounter for screening for other disorder: Secondary | ICD-10-CM | POA: Diagnosis not present

## 2018-01-11 DIAGNOSIS — I2510 Atherosclerotic heart disease of native coronary artery without angina pectoris: Secondary | ICD-10-CM | POA: Diagnosis not present

## 2018-01-11 DIAGNOSIS — E7800 Pure hypercholesterolemia, unspecified: Secondary | ICD-10-CM | POA: Diagnosis not present

## 2018-01-11 DIAGNOSIS — Z0000 Encounter for general adult medical examination without abnormal findings: Secondary | ICD-10-CM | POA: Diagnosis not present

## 2018-03-31 ENCOUNTER — Ambulatory Visit: Payer: PPO | Admitting: Interventional Cardiology

## 2018-03-31 NOTE — Progress Notes (Deleted)
Cardiology Office Note   Date:  03/31/2018   ID:  ADE STMARIE, DOB 11-01-1926, MRN [id number]  PCP:  Lavone Orn, MD    No chief complaint on file.    Wt Readings from Last 3 Encounters:  03/23/17 158 lb 9.6 oz (71.9 kg)  03/14/16 162 lb 12.8 oz (73.8 kg)  02/27/16 164 lb (74.4 kg)       History of Present Illness: [name] is a 85 y.o. male  who had an AVR/CABG in 2010. Blood preesure at the [facility] is usually in the 283-151 range systolic. THis is higher than before.   He exercises and gets his HR to the 100 range. His daughter was diagnosed with lung cancer several years ago and there is some stress with that.    Sons have muscular dystrophy, one in [place] and one in [place].    He had falls in 8/17 and 10/17.  Went to ER in 10/17.  He had not been drinking a lot of water and was diagnosed with dehydration.  After staying hydrated, no problems.  Past Medical History:  Diagnosis Date  . BPH (benign prostatic hyperplasia)   . Coronary artery disease with hx of myocardial infarct w/o hx of CABG 2010  . Dehydration   . Diverticulosis   . HOH (hard of hearing)    hearing aids  . Hyperlipidemia   . Hypertension   . Impaired fasting glucose   . Memory difficulty 10/19/2012  . Memory loss   . 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 Retinal tear   . Severe aortic stenosis 2010   S/P AVR-TISSUE VALVE    Past Surgical History:  Procedure Laterality Date  . 2 VESSEL CABG    . AORTIC VALVE REPLACEMENT    . BARTLE    . CATARACT EXTRACTION, BILATERAL     RT 2010 LT 2014  . TONSILLECTOMY       Current Outpatient Medications  Medication Sig Dispense Refill  . amLODipine (NORVASC) 10 MG tablet Take 5 mg by mouth daily.    Marland Kitchen amoxicillin (AMOXIL) 500 MG tablet TAKE 4 TABLETS BY MOUTH 1 HOUR BEFORE DENTAL PROCEDURE 4 tablet 3  . aspirin EC 81 MG tablet Take 81 mg by mouth daily.    Marland Kitchen atorvastatin (LIPITOR) 10 MG tablet Take 10 mg by mouth daily.    .  Multiple Vitamins-Minerals (MULTIVITAMIN WITH MINERALS) tablet Take 1 tablet by mouth daily.     No current facility-administered medications for this visit.     Allergies:   Patient has no known allergies.    Social History:  The patient  reports that he has been smoking pipe. He has never used smokeless tobacco. He reports that he does not drink alcohol or use drugs.   Family History:  The patient's ***family history includes Cancer in his brother; Hypertension in his brother; Stroke in his father.    ROS:  Please see the history of present illness.   Otherwise, review of systems are positive for ***.   All other systems are reviewed and negative.    PHYSICAL EXAM: VS:  There were no vitals taken for this visit. , BMI There is no height or weight on file to calculate BMI. GEN: Well nourished, well developed, in no acute distress HEENT: normal Neck: no JVD, carotid bruits, or masses Cardiac: ***RRR; no murmurs, rubs, or gallops,no edema  Respiratory:  clear to auscultation bilaterally, normal work of breathing GI: soft, nontender, nondistended, +  BS MS: no deformity or atrophy Skin: warm and dry, no rash Neuro:  Strength and sensation are intact Psych: euthymic mood, full affect   EKG:   The ekg ordered today demonstrates ***   Recent Labs: No results found for requested labs within last 8760 hours.   Lipid Panel No results found for: CHOL, TRIG, HDL, CHOLHDL, VLDL, LDLCALC, LDLDIRECT   Other studies Reviewed: Additional studies/ records that were reviewed today with results demonstrating: ***.   ASSESSMENT AND PLAN:  1. CAD:  2. AVR: 3. HTN: 4. Bradycardia: 5. Hyperlipidemia:   Current medicines are reviewed at length with the patient today.  The patient concerns regarding his medicines were addressed.  The following changes have been made:  No change***  Labs/ tests ordered today include: *** No orders of the defined types were placed in this encounter.    Recommend 150 minutes/week of aerobic exercise Low fat, low carb, high fiber diet recommended  Disposition:   FU in ***   Signed, Larae Grooms, MD  03/31/2018 12:57 PM    [facility] [place], [place], [place]  [address] Phone: [phone]; Fax: [phone]

## 2018-04-01 ENCOUNTER — Encounter: Payer: Self-pay | Admitting: Interventional Cardiology

## 2018-05-18 ENCOUNTER — Telehealth: Payer: Self-pay

## 2018-05-18 NOTE — Telephone Encounter (Signed)
Called pt to set up possible evisit left message asking pt to call the office.

## 2018-05-19 NOTE — Telephone Encounter (Signed)
Attempted to reach patient again and there is a fast busy signal. Will try again later.

## 2018-05-19 NOTE — Telephone Encounter (Signed)
Left message for patient to call back  

## 2018-05-20 ENCOUNTER — Ambulatory Visit: Payer: PPO | Admitting: Interventional Cardiology

## 2018-05-20 ENCOUNTER — Other Ambulatory Visit: Payer: Self-pay

## 2018-05-20 NOTE — Telephone Encounter (Signed)
Multiple attempts have been made to reach the patient, but there has been no answer. Will send letter in the mail for patient to call back and reschedule.

## 2018-06-28 DIAGNOSIS — H04123 Dry eye syndrome of bilateral lacrimal glands: Secondary | ICD-10-CM | POA: Diagnosis not present

## 2018-06-28 DIAGNOSIS — H401112 Primary open-angle glaucoma, right eye, moderate stage: Secondary | ICD-10-CM | POA: Diagnosis not present

## 2018-06-28 DIAGNOSIS — Z961 Presence of intraocular lens: Secondary | ICD-10-CM | POA: Diagnosis not present

## 2018-06-28 DIAGNOSIS — H401121 Primary open-angle glaucoma, left eye, mild stage: Secondary | ICD-10-CM | POA: Diagnosis not present

## 2018-07-12 DIAGNOSIS — I10 Essential (primary) hypertension: Secondary | ICD-10-CM | POA: Diagnosis not present

## 2018-07-14 ENCOUNTER — Telehealth: Payer: Self-pay

## 2018-07-14 NOTE — Telephone Encounter (Signed)
    COVID-19 Pre-Screening Questions:  . In the past 7 to 10 days have you had a cough,  shortness of breath, headache, congestion, fever (100 or greater) body aches, chills, sore throat, or sudden loss of taste or sense of smell? No . Have you been around anyone with known Covid 19. no . Have you been around anyone who is awaiting Covid 19 test results in the past 7 to 10 days? no . Have you been around anyone who has been exposed to Covid 19, or has mentioned symptoms of Covid 19 within the past 7 to 10 days?no  If you have any concerns/questions about symptoms patients report during screening (either on the phone or at threshold). Contact the provider seeing the patient or DOD for further guidance.  If neither are available contact a member of the leadership team.            

## 2018-07-19 ENCOUNTER — Ambulatory Visit: Payer: PPO | Admitting: Interventional Cardiology

## 2018-08-09 ENCOUNTER — Ambulatory Visit: Payer: PPO | Admitting: Interventional Cardiology

## 2018-08-17 ENCOUNTER — Telehealth: Payer: Self-pay | Admitting: Physician Assistant

## 2018-08-17 DIAGNOSIS — Z87898 Personal history of other specified conditions: Secondary | ICD-10-CM | POA: Insufficient documentation

## 2018-08-17 NOTE — Telephone Encounter (Signed)

## 2018-08-17 NOTE — Progress Notes (Signed)
Cardiology Office Note    Date:  08/18/2018   ID:  Azzan, Butler 01/28/1926, MRN [id number]  PCP:  Lavone Orn, MD  Cardiologist: Larae Grooms, MD EPS: None  Chief Complaint  Patient presents with  . Follow-up    History of Present Illness:  [name] is a 85 y.o. male with history of CABG and AVR tissue valve 2008/05/02.  Also has hypertension, history of bradycardia, HLD.  Last saw Dr. Irish Lack May 02, 2017 and had a lot of stress when daughter died of lung cancer in May 02, 2016 and sons have muscular dystrophy.   Patient here for yearly f/u. He and his wife are in independent living at [facility]. They will have to move to assisted living soon as his wife is immobile from muscular dystrophy.Not exercising as much since his move. He walks around the facility.Denies chest pain, shortness of breath, dizziness or presyncope.  Stays hydrated. Gave up smoking his pipe in January when he ran out of tobacco. Gave up golf in the fall.    Past Medical History:  Diagnosis Date  . BPH (benign prostatic hyperplasia)   . Coronary artery disease with hx of myocardial infarct w/o hx of CABG 05/02/08  . Dehydration   . Diverticulosis   . HOH (hard of hearing)    hearing aids  . Hyperlipidemia   . Hypertension   . Impaired fasting glucose   . Memory difficulty 10/19/2012  . Memory loss   . 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 Retinal tear   . Severe aortic stenosis May 02, 2008   S/P AVR-TISSUE VALVE    Past Surgical History:  Procedure Laterality Date  . 2 VESSEL CABG    . AORTIC VALVE REPLACEMENT    . BARTLE    . CATARACT EXTRACTION, BILATERAL     RT 2008-05-02 LT 05-02-12  . TONSILLECTOMY      Current Medications: Current Meds  Medication Sig  . amLODipine (NORVASC) 10 MG tablet Take 5 mg by mouth daily.  Marland Kitchen amoxicillin (AMOXIL) 500 MG tablet TAKE 4 TABLETS BY MOUTH 1 HOUR BEFORE DENTAL PROCEDURE  . aspirin EC 81 MG tablet Take 81 mg by mouth daily.  Marland Kitchen atorvastatin (LIPITOR) 10 MG tablet Take  10 mg by mouth daily.  . Multiple Vitamins-Minerals (MULTIVITAMIN WITH MINERALS) tablet Take 1 tablet by mouth daily.     Allergies:   Patient has no known allergies.   Social History   Socioeconomic History  . Marital status: Married    Spouse name: Not on file  . Number of children: 3  . Years of education: BA  . Highest education level: Not on file  Occupational History  . Occupation: Retired  Scientific laboratory technician  . Financial resource strain: Not on file  . Food insecurity    Worry: Not on file    Inability: Not on file  . Transportation needs    Medical: Not on file    Non-medical: Not on file  Tobacco Use  . Smoking status: Current Some Day Smoker    Types: Pipe  . Smokeless tobacco: Never Used  . Tobacco comment: PIPE  Substance and Sexual Activity  . Alcohol use: No    Comment: RARELY  . Drug use: No  . Sexual activity: Not on file  Lifestyle  . Physical activity    Days per week: Not on file    Minutes per session: Not on file  . Stress: Not on file  Relationships  . Social connections  Talks on phone: Not on file    Gets together: Not on file    Attends religious service: Not on file    Active member of club or organization: Not on file    Attends meetings of clubs or organizations: Not on file    Relationship status: Not on file  Other Topics Concern  . Not on file  Social History Narrative   Patient lives at home with his wife [name])   Retired.   Education college B.S.   Right handed.   Caffeine - Decaf coffee     Family History:  The patient's family history includes Cancer in his brother; Hypertension in his brother; Stroke in his father.   ROS:   Please see the history of present illness.    ROS All other systems reviewed and are negative.   PHYSICAL EXAM:   VS:  BP 122/72   Pulse 70   Ht 5' 7.5" (1.715 m)   Wt 167 lb 9.6 oz (76 kg)   BMI 25.86 kg/m   Physical Exam  GEN: Well nourished, well developed, in no acute distress  Neck: no  JVD, carotid bruits, or masses Cardiac:RRR; 1/6 systolic murmur LSB Respiratory:  clear to auscultation bilaterally, normal work of breathing GI: soft, nontender, nondistended, + BS Ext: without cyanosis, clubbing, or edema, Good distal pulses bilaterally Neuro:  Alert and Oriented x 3 Psych: euthymic mood, full affect  Wt Readings from Last 3 Encounters:  08/18/18 167 lb 9.6 oz (76 kg)  03/23/17 158 lb 9.6 oz (71.9 kg)  03/14/16 162 lb 12.8 oz (73.8 kg)      Studies/Labs Reviewed:   EKG:  EKG is  ordered today.  The ekg ordered today demonstrates NSR with nonspecific ST changes.  Recent Labs: No results found for requested labs within last 8760 hours.   Lipid Panel No results found for: CHOL, TRIG, HDL, CHOLHDL, VLDL, LDLCALC, LDLDIRECT  Additional studies/ records that were reviewed today include:    Echo 2010SUMMARY   -  Overall left ventricular systolic function was at the lower         limits of normal. Left ventricular ejection fraction was         estimated to be 50 %. There were no left ventricular regional         wall motion abnormalities. Left ventricular diastolic         function parameters were normal.   -  Aortic valve thickness was moderately to markedly increased. The         aortic valve was moderately calcified. There was markedly         reduced aortic valve leaflet excursion. Findings were         consistent with severe aortic valve stenosis. There was mild         aortic valvular regurgitation. The mean transaortic valve         gradient was 51.8 mmHg. Estimated aortic valve area (by VTI)         was 0.45 cm^2. Estimated aortic valve area (by Vmax) was 0.45         cm^2.   -  There was mild mitral valvular regurgitation.     ASSESSMENT:    1. Atherosclerosis of native coronary artery of native heart without angina pectoris   2. Aortic valve disorder   3. Essential hypertension, benign   4. Mixed hyperlipidemia   5. History of bradycardia   6.  Tobacco abuse  PLAN:  In order of problems listed above:  CAD status post CABG and tissue valve AVR 2010 continue SBE prophylaxis    Essential hypertension BP well controlled on amlodipine  Hyperlipidemia LDL 62 in 12/2017  History of bradycardia no rate lowering meds  Tobacco abuse- quit smoking his pipe in January.    Medication Adjustments/Labs and Tests Ordered: Current medicines are reviewed at length with the patient today.  Concerns regarding medicines are outlined above.  Medication changes, Labs and Tests ordered today are listed in the Patient Instructions below. Patient Instructions  Medication Instructions:  Your physician recommends that you continue on your current medications as directed. Please refer to the Current Medication list given to you today.  If you need a refill on your cardiac medications before your next appointment, please call your pharmacy.   Lab work: None   If you have labs (blood work) drawn today and your tests are completely normal, you will receive your results only by: Marland Kitchen MyChart Message (if you have MyChart) OR . A paper copy in the mail If you have any lab test that is abnormal or we need to change your treatment, we will call you to review the results.  Testing/Procedures: None   Follow-Up: At [facility], you and your health needs are our priority.  As part of our continuing mission to provide you with exceptional heart care, we have created designated Provider Care Teams.  These Care Teams include your primary Cardiologist (physician) and Advanced Practice Providers (APPs -  Physician Assistants and Nurse Practitioners) who all work together to provide you with the care you need, when you need it. You will need a follow up appointment in 12 months.  Please call our office 2 months in advance to schedule this appointment.  You may see Larae Grooms, MD or one of the following Advanced Practice Providers on your designated  Care Team:   [place], PA-C Melina Copa, PA-C . Ermalinda Barrios, PA-C  Any Other Special Instructions Will Be Listed Below (If Applicable).       Sumner Boast, PA-C  08/18/2018 9:07 AM    [facility] [address], [place], [place]  [address] Phone: [phone]; Fax: [phone]

## 2018-08-18 ENCOUNTER — Ambulatory Visit: Payer: PPO | Admitting: Physician Assistant

## 2018-08-18 ENCOUNTER — Other Ambulatory Visit: Payer: Self-pay

## 2018-08-18 ENCOUNTER — Encounter: Payer: Self-pay | Admitting: Physician Assistant

## 2018-08-18 VITALS — BP 122/72 | HR 70 | Ht 67.5 in | Wt 167.6 lb

## 2018-08-18 DIAGNOSIS — I2510 Atherosclerotic heart disease of native coronary artery without angina pectoris: Secondary | ICD-10-CM

## 2018-08-18 DIAGNOSIS — E782 Mixed hyperlipidemia: Secondary | ICD-10-CM

## 2018-08-18 DIAGNOSIS — I359 Nonrheumatic aortic valve disorder, unspecified: Secondary | ICD-10-CM

## 2018-08-18 DIAGNOSIS — Z720 Tobacco use: Secondary | ICD-10-CM

## 2018-08-18 DIAGNOSIS — Z87898 Personal history of other specified conditions: Secondary | ICD-10-CM

## 2018-08-18 DIAGNOSIS — Z8679 Personal history of other diseases of the circulatory system: Secondary | ICD-10-CM | POA: Diagnosis not present

## 2018-08-18 DIAGNOSIS — I10 Essential (primary) hypertension: Secondary | ICD-10-CM | POA: Diagnosis not present

## 2018-08-18 NOTE — Patient Instructions (Signed)
Medication Instructions:  Your physician recommends that you continue on your current medications as directed. Please refer to the Current Medication list given to you today.  If you need a refill on your cardiac medications before your next appointment, please call your pharmacy.   Lab work: None   If you have labs (blood work) drawn today and your tests are completely normal, you will receive your results only by: Marland Kitchen MyChart Message (if you have MyChart) OR . A paper copy in the mail If you have any lab test that is abnormal or we need to change your treatment, we will call you to review the results.  Testing/Procedures: None   Follow-Up: At [facility], you and your health needs are our priority.  As part of our continuing mission to provide you with exceptional heart care, we have created designated Provider Care Teams.  These Care Teams include your primary Cardiologist (physician) and Advanced Practice Providers (APPs -  Physician Assistants and Nurse Practitioners) who all work together to provide you with the care you need, when you need it. You will need a follow up appointment in 12 months.  Please call our office 2 months in advance to schedule this appointment.  You may see Larae Grooms, MD or one of the following Advanced Practice Providers on your designated Care Team:   [place], PA-C Melina Copa, PA-C . Ermalinda Barrios, PA-C  Any Other Special Instructions Will Be Listed Below (If Applicable).

## 2018-08-30 ENCOUNTER — Encounter: Payer: Self-pay | Admitting: Podiatry

## 2018-08-30 ENCOUNTER — Other Ambulatory Visit: Payer: Self-pay

## 2018-08-30 ENCOUNTER — Ambulatory Visit: Payer: PPO | Admitting: Podiatry

## 2018-08-30 VITALS — Temp 97.2°F

## 2018-08-30 DIAGNOSIS — M79675 Pain in left toe(s): Secondary | ICD-10-CM | POA: Diagnosis not present

## 2018-08-30 DIAGNOSIS — M79674 Pain in right toe(s): Secondary | ICD-10-CM

## 2018-08-30 DIAGNOSIS — B351 Tinea unguium: Secondary | ICD-10-CM

## 2018-09-01 DIAGNOSIS — H401121 Primary open-angle glaucoma, left eye, mild stage: Secondary | ICD-10-CM | POA: Diagnosis not present

## 2018-09-01 DIAGNOSIS — H401112 Primary open-angle glaucoma, right eye, moderate stage: Secondary | ICD-10-CM | POA: Diagnosis not present

## 2018-09-01 NOTE — Progress Notes (Signed)
Subjective: 85 y.o. returns the office today for painful, elongated, thickened toenails which he cannot trim himself. Denies any redness or drainage around the nails.  He has been doing topical antifungal which is been helpful.  Denies any acute changes since last appointment and no new complaints today. Denies any systemic complaints such as fevers, chills, nausea, vomiting.   PCP: Lavone Orn, MD Objective: AAO 3, NAD DP/PT pulses palpable, CRT less than 3 seconds Nails hypertrophic, dystrophic, elongated, brittle, discolored 10. There is tenderness overlying the nails 1-5 bilaterally. There is no surrounding erythema or drainage along the nail sites. No open lesions or pre-ulcerative lesions are identified. No other areas of tenderness bilateral lower extremities. No overlying edema, erythema, increased warmth. No pain with calf compression, swelling, warmth, erythema.  Assessment: Patient presents with symptomatic onychomycosis  Plan: -Treatment options including alternatives, risks, complications were discussed -Nails sharply debrided 10 without complication/bleeding. -Discussed daily foot inspection. If there are any changes, to call the office immediately.  -Follow-up in 3 months or sooner if any problems are to arise. In the meantime, encouraged to call the office with any questions, concerns, changes symptoms.  Celesta Gentile, DPM

## 2018-11-10 DIAGNOSIS — D225 Melanocytic nevi of trunk: Secondary | ICD-10-CM | POA: Diagnosis not present

## 2018-11-10 DIAGNOSIS — L820 Inflamed seborrheic keratosis: Secondary | ICD-10-CM | POA: Diagnosis not present

## 2018-11-10 DIAGNOSIS — L821 Other seborrheic keratosis: Secondary | ICD-10-CM | POA: Diagnosis not present

## 2018-11-10 DIAGNOSIS — Z23 Encounter for immunization: Secondary | ICD-10-CM | POA: Diagnosis not present

## 2018-11-10 DIAGNOSIS — D485 Neoplasm of uncertain behavior of skin: Secondary | ICD-10-CM | POA: Diagnosis not present

## 2018-11-10 DIAGNOSIS — L570 Actinic keratosis: Secondary | ICD-10-CM | POA: Diagnosis not present

## 2018-11-22 DIAGNOSIS — H401121 Primary open-angle glaucoma, left eye, mild stage: Secondary | ICD-10-CM | POA: Diagnosis not present

## 2018-11-22 DIAGNOSIS — Z961 Presence of intraocular lens: Secondary | ICD-10-CM | POA: Diagnosis not present

## 2018-11-22 DIAGNOSIS — H04123 Dry eye syndrome of bilateral lacrimal glands: Secondary | ICD-10-CM | POA: Diagnosis not present

## 2018-11-22 DIAGNOSIS — H401112 Primary open-angle glaucoma, right eye, moderate stage: Secondary | ICD-10-CM | POA: Diagnosis not present

## 2018-11-29 ENCOUNTER — Ambulatory Visit: Payer: PPO | Admitting: Podiatry

## 2018-12-20 ENCOUNTER — Ambulatory Visit: Payer: PPO | Admitting: Podiatry

## 2019-01-03 ENCOUNTER — Ambulatory Visit: Payer: PPO | Admitting: Podiatry

## 2019-01-10 ENCOUNTER — Encounter: Payer: Self-pay | Admitting: Podiatry

## 2019-01-10 ENCOUNTER — Other Ambulatory Visit: Payer: Self-pay

## 2019-01-10 ENCOUNTER — Ambulatory Visit (INDEPENDENT_AMBULATORY_CARE_PROVIDER_SITE_OTHER): Payer: PPO | Admitting: Podiatry

## 2019-01-10 DIAGNOSIS — M79674 Pain in right toe(s): Secondary | ICD-10-CM

## 2019-01-10 DIAGNOSIS — M79675 Pain in left toe(s): Secondary | ICD-10-CM

## 2019-01-10 DIAGNOSIS — Z20828 Contact with and (suspected) exposure to other viral communicable diseases: Secondary | ICD-10-CM | POA: Diagnosis not present

## 2019-01-10 DIAGNOSIS — Z1159 Encounter for screening for other viral diseases: Secondary | ICD-10-CM | POA: Diagnosis not present

## 2019-01-10 DIAGNOSIS — B351 Tinea unguium: Secondary | ICD-10-CM

## 2019-01-16 NOTE — Progress Notes (Signed)
Subjective: 85 y.o. returns the office today for painful, elongated, thickened toenails which he cannot trim himself. Denies any redness or drainage around the nails.  He is still using topical antifungal.  He is having no side effects.  Denies any acute changes since last appointment and no new complaints today. Denies any systemic complaints such as fevers, chills, nausea, vomiting.   PCP: Lavone Orn, MD Objective: AAO 3, NAD DP/PT pulses palpable, CRT less than 3 seconds Nails hypertrophic, dystrophic, elongated, brittle, discolored 10. There is tenderness overlying the nails 1-5 bilaterally. There is no surrounding erythema or drainage along the nail sites. No open lesions or pre-ulcerative lesions are identified. No other areas of tenderness bilateral lower extremities. No overlying edema, erythema, increased warmth. No pain with calf compression, swelling, warmth, erythema.  Assessment: Patient presents with symptomatic onychomycosis  Plan: -Treatment options including alternatives, risks, complications were discussed -Nails sharply debrided 10 without complication/bleeding. -Discussed daily foot inspection. If there are any changes, to call the office immediately.  -Continue topical antifungal -Follow-up in 3 months or sooner if any problems are to arise. In the meantime, encouraged to call the office with any questions, concerns, changes symptoms.  Celesta Gentile, DPM

## 2019-01-17 DIAGNOSIS — Z20828 Contact with and (suspected) exposure to other viral communicable diseases: Secondary | ICD-10-CM | POA: Diagnosis not present

## 2019-01-17 DIAGNOSIS — Z1159 Encounter for screening for other viral diseases: Secondary | ICD-10-CM | POA: Diagnosis not present

## 2019-07-13 ENCOUNTER — Encounter: Payer: Self-pay | Admitting: Podiatry

## 2019-07-13 ENCOUNTER — Other Ambulatory Visit: Payer: Self-pay

## 2019-07-13 ENCOUNTER — Ambulatory Visit: Payer: Medicare Other | Admitting: Podiatry

## 2019-07-13 DIAGNOSIS — M79675 Pain in left toe(s): Secondary | ICD-10-CM

## 2019-07-13 DIAGNOSIS — B351 Tinea unguium: Secondary | ICD-10-CM | POA: Insufficient documentation

## 2019-07-13 DIAGNOSIS — N179 Acute kidney failure, unspecified: Secondary | ICD-10-CM | POA: Diagnosis not present

## 2019-07-13 DIAGNOSIS — M79674 Pain in right toe(s): Secondary | ICD-10-CM

## 2019-07-13 NOTE — Progress Notes (Signed)
This patient returns to my office for at risk foot care.  This patient requires this care by a professional since this patient will be at risk due to having acute kidney injury.  This patient is unable to cut nails himself since the patient cannot reach his nails.These nails are painful walking and wearing shoes.  This patient presents for at risk foot care today.  General Appearance  Alert, conversant and in no acute stress.  Vascular  Dorsalis pedis and posterior tibial  pulses are palpable  bilaterally.  Capillary return is within normal limits  bilaterally. Temperature is within normal limits  bilaterally.  Neurologic  Senn-Weinstein monofilament wire test within normal limits  bilaterally. Muscle power within normal limits bilaterally.  Nails Thick disfigured discolored nails with subungual debris  from hallux to fifth toes bilaterally. No evidence of bacterial infection or drainage bilaterally.  Orthopedic  No limitations of motion  feet .  No crepitus or effusions noted.  No bony pathology or digital deformities noted.  Skin  normotropic skin with no porokeratosis noted bilaterally.  No signs of infections or ulcers noted.   Asymptomatic callus sub 4 left foot.  Onychomycosis  Pain in right toes  Pain in left toes  Consent was obtained for treatment procedures.   Mechanical debridement of nails 1-5  bilaterally performed with a nail nipper.  Filed with dremel without incident.    Return office visit   3 months                   Told patient to return for periodic foot care and evaluation due to potential at risk complications.   Gardiner Barefoot DPM

## 2019-10-19 ENCOUNTER — Ambulatory Visit: Payer: Medicare Other | Admitting: Podiatry

## 2020-01-03 NOTE — Progress Notes (Signed)
Cardiology Office Note   Date:  01/04/2020   ID:  [name], DOB [birth year], MRN [id number]  PCP:  Lavone Orn, MD  Cardiologist:  Dr. Irish Lack, MD   Chief Complaint  Patient presents with   Follow-up    History of Present Illness: [name] is a 86 y.o. male who presents for 1 year follow-up, seen for Dr. Irish Lack.   He has a history of CABG/AVR tissue valve in 2010, HTN, history of bradycardia and HLD.  He was last seen by Estella Husk, PA-C on 08/18/2018 for yearly follow-up.  At that time, the patient and his wife are living at [facility] and reported they were likely going to have to transfer to assisted living due to his wife's immobility secondary to muscular dystrophy.  He had stopped smoking his pipe and gave golf up in the fall.  Today he is here alone. He states that he has bene doing well however does have reports of what sounds like orthostatic dizziness. He states that his diet and fluid intake likely are not adqequate there fore we emphasized increasing his plain water intake and focusing on change of posiion slowly. He may need the addition of compression stocking. On exam today he has significant ankle and lower extremity edema, 3+. He denies SOB, DOE, orthopnea or anginal symptoms. He states that the edema has been present for the last several months. He has not been concerned about it.   Past Medical History:  Diagnosis Date   BPH (benign prostatic hyperplasia)    Coronary artery disease with hx of myocardial infarct w/o hx of CABG 2010   Dehydration    Diverticulosis    HOH (hard of hearing)    hearing aids   Hyperlipidemia    Hypertension    Impaired fasting glucose    Memory difficulty 10/19/2012   Memory loss    Mesenteric panniculitis ([place])    Retinal tear    Severe aortic stenosis 2010   S/P AVR-TISSUE VALVE    Past Surgical History:  Procedure Laterality Date   2 VESSEL CABG     AORTIC VALVE REPLACEMENT      BARTLE     CATARACT EXTRACTION, BILATERAL     RT 2010 LT 2014   TONSILLECTOMY      Current Outpatient Medications  Medication Sig Dispense Refill   amLODipine (NORVASC) 10 MG tablet Take 5 mg by mouth daily.     amoxicillin (AMOXIL) 500 MG tablet TAKE 4 TABLETS BY MOUTH 1 HOUR BEFORE DENTAL PROCEDURE 4 tablet 3   aspirin EC 81 MG tablet Take 81 mg by mouth daily.     atorvastatin (LIPITOR) 10 MG tablet Take 10 mg by mouth daily.     latanoprost (XALATAN) 0.005 % ophthalmic solution 1 drop daily.     Multiple Vitamins-Minerals (MULTIVITAMIN WITH MINERALS) tablet Take 1 tablet by mouth daily.     furosemide (LASIX) 40 MG tablet Take 1 tablet (40 mg total) by mouth daily. 90 tablet 3   No current facility-administered medications for this visit.    Allergies:   Patient has no allergy information on record.    Social History:  The patient  reports that he has been smoking pipe. He has never used smokeless tobacco. He reports that he does not drink alcohol and does not use drugs.   Family History:  The patient's family history includes Cancer in his brother; Hypertension in his brother; Stroke in his father.  ROS:  Please see the history of present illness.   Otherwise, review of systems are positive for none.   All other systems are reviewed and negative.    PHYSICAL EXAM: VS:  BP (!) 152/58    Pulse 70    Ht 5\' 7"  (1.702 m)    Wt 158 lb (71.7 kg)    SpO2 95%    BMI 24.75 kg/m  , BMI Body mass index is 24.75 kg/m.   General: Elderly, NAD Neck:  No JVD Lungs:Clear to ausculation bilaterally. No wheezes, rales, or rhonchi. Breathing is unlabored. Cardiovascular: RRR with S1 S2. + murmurs Extremities: 2-3+ BLE edema. Neuro: Alert and oriented. No focal deficits. No facial asymmetry. MAE spontaneously. Psych: Responds to questions appropriately with normal affect.    EKG:  EKG is ordered today. The ekg ordered today demonstrates NSR with frequent PVCs, HR 70bpm     Recent Labs: No results found for requested labs within last 8760 hours.    Lipid Panel No results found for: CHOL, TRIG, HDL, CHOLHDL, VLDL, LDLCALC, LDLDIRECT    Wt Readings from Last 3 Encounters:  01/04/20 158 lb (71.7 kg)  08/18/18 167 lb 9.6 oz (76 kg)  03/23/17 158 lb 9.6 oz (71.9 kg)     Other studies Reviewed: Additional studies/ records that were reviewed today include:     Echo 2010: - Overall left ventricular systolic function was at the lower     limits of normal. Left ventricular ejection fraction was     estimated to be 50 %. There were no left ventricular regional     wall motion abnormalities. Left ventricular diastolic     function parameters were normal.  - Aortic valve thickness was moderately to markedly increased. The     aortic valve was moderately calcified. There was markedly     reduced aortic valve leaflet excursion. Findings were     consistent with severe aortic valve stenosis. There was mild     aortic valvular regurgitation. The mean transaortic valve     gradient was 51.8 mmHg. Estimated aortic valve area (by VTI)     was 0.45 cm^2. Estimated aortic valve area (by Vmax) was 0.45     cm^2.  - There was mild mitral valvular regurgitation.  Complaint "50  ASSESSMENT AND PLAN:  1.  CAD s/p CABG/AVR 2010: -SBE prophylaxis -Denies anginal symptoms -Continue current  -Needs to add ASA   2.  HTN: -Stable, 152/58 -Continue amlodipine -If edema remains after Lasix, may consider transitioning to losartan for BP control   3.  HLD: -Last LDL, 62 and 12/2017 -Reports close follow up with PCP next month for labs and exam   4.  History of bradycardia: -Avoid AV nodal blocking agents  5.  History of tobacco use: -Stopped smoking his pipe 01/2018  6. PVCs: -On EKG today -No reports of palpitations -Will obtain labs -Echo prior to next OV in 2 weeks  -Hx of bradycardia>>hold off on BB  for now   7. LE edema: -Significant LE edema on exam today -Add Lasix 40mg  PO QD today -Likely will not need long term Lasix depending on response -BMET today with close follow up and echo  -No SOB, orthopnea   Current medicines are reviewed at length with the patient today.  The patient does not have concerns regarding medicines.  The following changes have been made:  Lasix 40mg  PO QD  Labs/ tests ordered today include: BMET, echo   Orders Placed This  Encounter  Procedures   Basic metabolic panel   EKG 62-IWLN   ECHOCARDIOGRAM COMPLETE   Disposition:   FU with Dr. Irish Lack or APP in 2 weeks  Signed, Kathyrn Drown, NP  01/04/2020 10:24 AM    [address], [place], [place]  [address] Phone: [phone]; Fax: [phone]

## 2020-01-04 ENCOUNTER — Ambulatory Visit: Payer: PPO | Admitting: Interventional Cardiology

## 2020-01-04 ENCOUNTER — Encounter: Payer: Self-pay | Admitting: Cardiology

## 2020-01-04 ENCOUNTER — Other Ambulatory Visit: Payer: Self-pay

## 2020-01-04 ENCOUNTER — Ambulatory Visit: Payer: Medicare Other | Admitting: Cardiology

## 2020-01-04 VITALS — BP 152/58 | HR 70 | Ht 67.0 in | Wt 158.0 lb

## 2020-01-04 DIAGNOSIS — Z87898 Personal history of other specified conditions: Secondary | ICD-10-CM | POA: Diagnosis not present

## 2020-01-04 DIAGNOSIS — I359 Nonrheumatic aortic valve disorder, unspecified: Secondary | ICD-10-CM | POA: Diagnosis not present

## 2020-01-04 DIAGNOSIS — R42 Dizziness and giddiness: Secondary | ICD-10-CM

## 2020-01-04 DIAGNOSIS — I10 Essential (primary) hypertension: Secondary | ICD-10-CM

## 2020-01-04 DIAGNOSIS — I493 Ventricular premature depolarization: Secondary | ICD-10-CM

## 2020-01-04 DIAGNOSIS — R600 Localized edema: Secondary | ICD-10-CM

## 2020-01-04 LAB — BASIC METABOLIC PANEL
BUN/Creatinine Ratio: 16 (ref 10–24)
BUN: 25 mg/dL (ref 10–36)
CO2: 25 mmol/L (ref 20–29)
Calcium: 9.4 mg/dL (ref 8.6–10.2)
Chloride: 102 mmol/L (ref 96–106)
Creatinine, Ser: 1.53 mg/dL — ABNORMAL HIGH (ref 0.76–1.27)
GFR calc Af Amer: 45 mL/min/{1.73_m2} — ABNORMAL LOW (ref >59)
GFR calc non Af Amer: 39 mL/min/{1.73_m2} — ABNORMAL LOW (ref >59)
Glucose: 114 mg/dL — ABNORMAL HIGH (ref 65–99)
Potassium: 5.3 mmol/L — ABNORMAL HIGH (ref 3.5–5.2)
Sodium: 138 mmol/L (ref 134–144)

## 2020-01-04 MED ORDER — FUROSEMIDE 40 MG PO TABS: 40.0000 mg | ORAL_TABLET | Freq: Every day | ORAL | 3 refills | Status: DC

## 2020-01-04 NOTE — Patient Instructions (Addendum)
Medication Instructions:  Your physician has recommended you make the following change in your medication:  1. START LASIX 40 MG DAILY IN THE MORNING.  *If you need a refill on your cardiac medications before your next appointment, please call your pharmacy*  Lab Work: TODAY: BMET If you have labs (blood work) drawn today and your tests are completely normal, you will receive your results only by: Marland Kitchen MyChart Message (if you have MyChart) OR . A paper copy in the mail If you have any lab test that is abnormal or we need to change your treatment, we will call you to review the results.  Testing/Procedures: Your physician has requested that you have an echocardiogram. Echocardiography is a painless test that uses sound waves to create images of your heart. It provides your doctor with information about the size and shape of your heart and how well your heart's chambers and valves are working. This procedure takes approximately one hour. There are no restrictions for this procedure. TO BE DONE IN 1-2 WEEKS, SAME DAY AS FOLLOW UP APPOINTMENT.   Follow-Up: At [facility], you and your health needs are our priority.  As part of our continuing mission to provide you with exceptional heart care, we have created designated Provider Care Teams.  These Care Teams include your primary Cardiologist (physician) and Advanced Practice Providers (APPs -  Physician Assistants and Nurse Practitioners) who all work together to provide you with the care you need, when you need it.  We recommend signing up for the patient portal called "MyChart".  Sign up information is provided on this After Visit Summary.  MyChart is used to connect with patients for Virtual Visits (Telemedicine).  Patients are able to view lab/test results, encounter notes, upcoming appointments, etc.  Non-urgent messages can be sent to your provider as well.   To learn more about what you can do with MyChart, go to [email or web address].     Your next appointment:   1-2 week(s) ON A Monday OR Wednesday    The format for your next appointment:   In Person  Provider:   You may see Larae Grooms, MD or one of the following Advanced Practice Providers on your designated Care Team:    Melina Copa, PA-C  Ermalinda Barrios, PA-C  Other Instructions   Echocardiogram An echocardiogram is a procedure that uses painless sound waves (ultrasound) to produce an image of the heart. Images from an echocardiogram can provide important information about:  Signs of coronary artery disease (CAD).  Aneurysm detection. An aneurysm is a weak or damaged part of an artery wall that bulges out from the normal force of blood pumping through the body.  Heart size and shape. Changes in the size or shape of the heart can be associated with certain conditions, including heart failure, aneurysm, and CAD.  Heart muscle function.  Heart valve function.  Signs of a past heart attack.  Fluid buildup around the heart.  Thickening of the heart muscle.  A tumor or infectious growth around the heart valves. Tell a health care provider about:  Any allergies you have.  All medicines you are taking, including vitamins, herbs, eye drops, creams, and over-the-counter medicines.  Any blood disorders you have.  Any surgeries you have had.  Any medical conditions you have.  Whether you are pregnant or may be pregnant. What are the risks? Generally, this is a safe procedure. However, problems may occur, including:  Allergic reaction to dye (contrast) that may be used during  the procedure. What happens before the procedure? No specific preparation is needed. You may eat and drink normally. What happens during the procedure?   An IV tube may be inserted into one of your veins.  You may receive contrast through this tube. A contrast is an injection that improves the quality of the pictures from your heart.  A gel will be applied to your  chest.  A wand-like tool (transducer) will be moved over your chest. The gel will help to transmit the sound waves from the transducer.  The sound waves will harmlessly bounce off of your heart to allow the heart images to be captured in real-time motion. The images will be recorded on a computer. The procedure may vary among health care providers and hospitals. What happens after the procedure?  You may return to your normal, everyday life, including diet, activities, and medicines, unless your health care provider tells you not to do that. Summary  An echocardiogram is a procedure that uses painless sound waves (ultrasound) to produce an image of the heart.  Images from an echocardiogram can provide important information about the size and shape of your heart, heart muscle function, heart valve function, and fluid buildup around your heart.  You do not need to do anything to prepare before this procedure. You may eat and drink normally.  After the echocardiogram is completed, you may return to your normal, everyday life, unless your health care provider tells you not to do that. This information is not intended to replace advice given to you by your health care provider. Make sure you discuss any questions you have with your health care provider. Document Revised: 04/29/2018 Document Reviewed: 02/09/2016 Elsevier Patient Education  [place].

## 2020-01-11 ENCOUNTER — Other Ambulatory Visit: Payer: Self-pay

## 2020-01-11 ENCOUNTER — Ambulatory Visit: Payer: Medicare Other | Admitting: Podiatry

## 2020-01-11 ENCOUNTER — Ambulatory Visit (HOSPITAL_COMMUNITY)
Admission: RE | Admit: 2020-01-11 | Discharge: 2020-01-11 | Disposition: A | Discharge status: Home / Self Care | Payer: Medicare Other | Source: Ambulatory Visit | Attending: Cardiology | Admitting: Cardiology

## 2020-01-11 DIAGNOSIS — R42 Dizziness and giddiness: Secondary | ICD-10-CM

## 2020-01-11 DIAGNOSIS — I10 Essential (primary) hypertension: Secondary | ICD-10-CM | POA: Insufficient documentation

## 2020-01-11 DIAGNOSIS — I359 Nonrheumatic aortic valve disorder, unspecified: Secondary | ICD-10-CM | POA: Diagnosis present

## 2020-01-11 DIAGNOSIS — Z953 Presence of xenogenic heart valve: Secondary | ICD-10-CM | POA: Insufficient documentation

## 2020-01-11 DIAGNOSIS — Z951 Presence of aortocoronary bypass graft: Secondary | ICD-10-CM | POA: Diagnosis not present

## 2020-01-11 DIAGNOSIS — E785 Hyperlipidemia, unspecified: Secondary | ICD-10-CM | POA: Diagnosis not present

## 2020-01-11 LAB — ECHOCARDIOGRAM COMPLETE
AR max vel: 1.61 cm2
AV Area VTI: 1.72 cm2
AV Area mean vel: 1.69 cm2
AV Mean grad: 10 mmHg
AV Peak grad: 16.2 mmHg
Ao pk vel: 2.01 m/s
Area-P 1/2: 2.99 cm2
S' Lateral: 3 cm

## 2020-01-11 NOTE — Progress Notes (Signed)
Echocardiogram 2D Echocardiogram has been performed.  Oneal Deputy Gerome Kokesh 01/11/2020, 2:48 PM

## 2020-01-22 NOTE — Progress Notes (Signed)
Cardiology Office Note   Date:  01/23/2020   ID:  [name], DOB 05-15-1981, MRN [id number]  PCP:  Lavone Orn, MD    No chief complaint on file.  CAD/AVR  Wt Readings from Last 3 Encounters:  01/23/20 155 lb (70.3 kg)  01/04/20 158 lb (71.7 kg)  08/18/18 167 lb 9.6 oz (76 kg)       History of Present Illness: [name] is a 87 y.o. male   who had an AVR/CABG in 2010 (tissue valve).  His daughter was diagnosed with lung cancer severalyears ago and this caused stress.  She passed away form lung cancer in September 2018.    Sons have muscular dystrophy, one in [place] and one in [place].  Wife has muscular dystrophy and may need assisted living.   Noted to have LE edema in 12/21 with plan of :  "Significant LE edema on exam today -Add Lasix 40mg  PO QD today -Likely will not need long term Lasix depending on response -BMET today with close follow up and echo "  Wife was recently admitted to the hospital and apparently had a stent.  She may need a higher level of care.  He feels leg swelling is better.  He tries to elevate legs while watching TV. He is tolerating the Lasix despite increased urination.     Past Medical History:  Diagnosis Date  . BPH (benign prostatic hyperplasia)   . Coronary artery disease with hx of myocardial infarct w/o hx of CABG 2010  . Dehydration   . Diverticulosis   . HOH (hard of hearing)    hearing aids  . Hyperlipidemia   . Hypertension   . Impaired fasting glucose   . Memory difficulty 10/19/2012  . Memory loss   . Mesenteric panniculitis ([place])   . Retinal tear   . Severe aortic stenosis 2010   S/P AVR-TISSUE VALVE    Past Surgical History:  Procedure Laterality Date  . 2 VESSEL CABG    . AORTIC VALVE REPLACEMENT    . BARTLE    . CATARACT EXTRACTION, BILATERAL     RT 2010 LT 2014  . TONSILLECTOMY       Current Outpatient Medications  Medication Sig Dispense Refill  . amLODipine (NORVASC) 10 MG  tablet Take 5 mg by mouth daily.    Marland Kitchen amoxicillin (AMOXIL) 500 MG tablet TAKE 4 TABLETS BY MOUTH 1 HOUR BEFORE DENTAL PROCEDURE 4 tablet 3  . aspirin EC 81 MG tablet Take 81 mg by mouth daily.    Marland Kitchen atorvastatin (LIPITOR) 10 MG tablet Take 10 mg by mouth daily.    . furosemide (LASIX) 40 MG tablet Take 1 tablet (40 mg total) by mouth daily. 90 tablet 3  . latanoprost (XALATAN) 0.005 % ophthalmic solution 1 drop daily.    . Multiple Vitamins-Minerals (MULTIVITAMIN WITH MINERALS) tablet Take 1 tablet by mouth daily.     No current facility-administered medications for this visit.    Allergies:   Patient has no allergy information on record.    Social History:  The patient  reports that he has been smoking pipe. He has never used smokeless tobacco. He reports that he does not drink alcohol and does not use drugs.   Family History:  The patient's family history includes Cancer in his brother; Hypertension in his brother; Stroke in his father.    ROS:  Please see the history of present illness.   Otherwise, review of systems are positive  for leg swelling.   All other systems are reviewed and negative.    PHYSICAL EXAM: VS:  BP (!) 102/52   Pulse 68   Ht 5\' 6"  (1.676 m)   Wt 155 lb (70.3 kg)   SpO2 97%   BMI 25.02 kg/m  , BMI Body mass index is 25.02 kg/m. GEN: Well nourished, well developed, in no acute distress ; frail HEENT: normal  Neck: no JVD, carotid bruits, or masses Cardiac: RRR; 2/6 early systolic murmur, no rubs, or gallops,; bilateral pitting 2+ edema  Respiratory:  clear to auscultation bilaterally, normal work of breathing GI: soft, nontender, nondistended, + BS MS: no deformity or atrophy  Skin: warm and dry, no rash Neuro:  Strength and sensation are intact Psych: euthymic mood, full affect   EKG:   The ekg ordered 12/21 demonstrates NSR, PVCs   Recent Labs: 01/04/2020: BUN 25; Creatinine, Ser 1.53; Potassium 5.3; Sodium 138   Lipid Panel No results found  for: CHOL, TRIG, HDL, CHOLHDL, VLDL, LDLCALC, LDLDIRECT   Other studies Reviewed: Additional studies/ records that were reviewed today with results demonstrating: labs reviewed- echo reviewed.   ASSESSMENT AND PLAN:  1. CAD: s/p CABG. No angina.  Continue aggressive secondary prevention.  Echo showed normal LVEF. 2. AVR: Functioning well by echo.  3. HTN: The current medical regimen is effective;  continue present plan and medications.  Normal LFT in 2/21.  If BP got too low, would decrease amlodipine to 5 mg daily.  4. Bradycardia: no AV nodal blocking agents.  5. Hyperlipidemia: LDL 57 in 2/21. Continue atorvastatin.   6. LE edema: COntinue Lasix. Check BMet today. Elevate legs at night to help with swelling. 7. PVC: No sx.    Current medicines are reviewed at length with the patient today.  The patient concerns regarding his medicines were addressed.  The following changes have been made:  No change  Labs/ tests ordered today include: BMet today No orders of the defined types were placed in this encounter.   Recommend 150 minutes/week of aerobic exercise Low fat, low carb, high fiber diet recommended  Disposition:   FU in 85 months   Signed, 3/21, MD  01/23/2020 12:09 PM    [facility] [address] [place], [place], [place]  [place] Phone: [phone]; Fax: [phone]

## 2020-01-23 ENCOUNTER — Encounter: Payer: Self-pay | Admitting: Interventional Cardiology

## 2020-01-23 ENCOUNTER — Ambulatory Visit: Payer: Medicare Other | Admitting: Interventional Cardiology

## 2020-01-23 ENCOUNTER — Other Ambulatory Visit: Payer: Self-pay

## 2020-01-23 VITALS — BP 102/52 | HR 68 | Ht 66.0 in | Wt 155.0 lb

## 2020-01-23 DIAGNOSIS — I2510 Atherosclerotic heart disease of native coronary artery without angina pectoris: Secondary | ICD-10-CM

## 2020-01-23 DIAGNOSIS — I359 Nonrheumatic aortic valve disorder, unspecified: Secondary | ICD-10-CM | POA: Diagnosis not present

## 2020-01-23 DIAGNOSIS — Z87898 Personal history of other specified conditions: Secondary | ICD-10-CM

## 2020-01-23 DIAGNOSIS — Z720 Tobacco use: Secondary | ICD-10-CM

## 2020-01-23 DIAGNOSIS — I10 Essential (primary) hypertension: Secondary | ICD-10-CM

## 2020-01-23 DIAGNOSIS — R600 Localized edema: Secondary | ICD-10-CM

## 2020-01-23 DIAGNOSIS — E782 Mixed hyperlipidemia: Secondary | ICD-10-CM

## 2020-01-23 DIAGNOSIS — I493 Ventricular premature depolarization: Secondary | ICD-10-CM

## 2020-01-23 LAB — BASIC METABOLIC PANEL
BUN/Creatinine Ratio: 20 (ref 10–24)
BUN: 32 mg/dL (ref 10–36)
CO2: 25 mmol/L (ref 20–29)
Calcium: 9.3 mg/dL (ref 8.6–10.2)
Chloride: 102 mmol/L (ref 96–106)
Creatinine, Ser: 1.58 mg/dL — ABNORMAL HIGH (ref 0.76–1.27)
GFR calc Af Amer: 44 mL/min/{1.73_m2} — ABNORMAL LOW (ref >59)
GFR calc non Af Amer: 38 mL/min/{1.73_m2} — ABNORMAL LOW (ref >59)
Glucose: 168 mg/dL — ABNORMAL HIGH (ref 65–99)
Potassium: 4.9 mmol/L (ref 3.5–5.2)
Sodium: 138 mmol/L (ref 134–144)

## 2020-01-23 NOTE — Patient Instructions (Signed)
Medication Instructions:  *If you need a refill on your cardiac medications before your next appointment, please call your pharmacy*   Lab Work: Your physician recommends that you have lab work today- BMET  If you have labs (blood work) drawn today and your tests are completely normal, you will receive your results only by: Marland Kitchen MyChart Message (if you have MyChart) OR . A paper copy in the mail If you have any lab test that is abnormal or we need to change your treatment, we will call you to review the results.  Follow-Up: At [facility], you and your health needs are our priority.  As part of our continuing mission to provide you with exceptional heart care, we have created designated Provider Care Teams.  These Care Teams include your primary Cardiologist (physician) and Advanced Practice Providers (APPs -  Physician Assistants and Nurse Practitioners) who all work together to provide you with the care you need, when you need it.  We recommend signing up for the patient portal called "MyChart".  Sign up information is provided on this After Visit Summary.  MyChart is used to connect with patients for Virtual Visits (Telemedicine).  Patients are able to view lab/test results, encounter notes, upcoming appointments, etc.  Non-urgent messages can be sent to your provider as well.   To learn more about what you can do with MyChart, go to [email or web address].    Your next appointment:   6 month(s)  The format for your next appointment:   In Person  Provider:   You may see Lance Muss, MD or one of the following Advanced Practice Providers on your designated Care Team:    Ronie Spies, PA-C  Jacolyn Reedy, PA-C

## 2020-02-08 ENCOUNTER — Ambulatory Visit: Payer: Medicare Other | Admitting: Podiatry

## 2020-02-15 ENCOUNTER — Ambulatory Visit (INDEPENDENT_AMBULATORY_CARE_PROVIDER_SITE_OTHER): Payer: Medicare Other | Admitting: Podiatry

## 2020-02-15 ENCOUNTER — Other Ambulatory Visit: Payer: Self-pay

## 2020-02-15 ENCOUNTER — Encounter: Payer: Self-pay | Admitting: Podiatry

## 2020-02-15 DIAGNOSIS — M79675 Pain in left toe(s): Secondary | ICD-10-CM | POA: Diagnosis not present

## 2020-02-15 DIAGNOSIS — M79674 Pain in right toe(s): Secondary | ICD-10-CM | POA: Diagnosis not present

## 2020-02-15 DIAGNOSIS — N179 Acute kidney failure, unspecified: Secondary | ICD-10-CM

## 2020-02-15 DIAGNOSIS — B351 Tinea unguium: Secondary | ICD-10-CM

## 2020-02-15 NOTE — Progress Notes (Signed)
This patient returns to my office for at risk foot care.  This patient requires this care by a professional since this patient will be at risk due to having acute kidney injury.  This patient is unable to cut nails himself since the patient cannot reach his nails.These nails are painful walking and wearing shoes.  This patient presents for at risk foot care today.  General Appearance  Alert, conversant and in no acute stress.  Vascular  Dorsalis pedis and posterior tibial  pulses are palpable  bilaterally.  Capillary return is within normal limits  bilaterally. Temperature is within normal limits  bilaterally.  Neurologic  Senn-Weinstein monofilament wire test within normal limits  bilaterally. Muscle power within normal limits bilaterally.  Nails Thick disfigured discolored nails with subungual debris  from hallux to fifth toes bilaterally. No evidence of bacterial infection or drainage bilaterally.  Orthopedic  No limitations of motion  feet .  No crepitus or effusions noted.  No bony pathology or digital deformities noted.  Skin  normotropic skin with no porokeratosis noted bilaterally.  No signs of infections or ulcers noted.   Asymptomatic callus sub 4 left foot.  Onychomycosis  Pain in right toes  Pain in left toes  Consent was obtained for treatment procedures.   Mechanical debridement of nails 1-5  bilaterally performed with a nail nipper.  Filed with dremel without incident.    Return office visit   3 months                   Told patient to return for periodic foot care and evaluation due to potential at risk complications.   Kirandeep Fariss DPM  

## 2020-05-16 ENCOUNTER — Encounter: Payer: Self-pay | Admitting: Podiatry

## 2020-05-16 ENCOUNTER — Other Ambulatory Visit: Payer: Self-pay

## 2020-05-16 ENCOUNTER — Ambulatory Visit: Payer: Medicare Other | Admitting: Podiatry

## 2020-05-16 DIAGNOSIS — M79675 Pain in left toe(s): Secondary | ICD-10-CM | POA: Diagnosis not present

## 2020-05-16 DIAGNOSIS — M79674 Pain in right toe(s): Secondary | ICD-10-CM | POA: Diagnosis not present

## 2020-05-16 DIAGNOSIS — B351 Tinea unguium: Secondary | ICD-10-CM | POA: Diagnosis not present

## 2020-05-16 DIAGNOSIS — N179 Acute kidney failure, unspecified: Secondary | ICD-10-CM | POA: Diagnosis not present

## 2020-05-16 NOTE — Progress Notes (Signed)
This patient returns to my office for at risk foot care.  This patient requires this care by a professional since this patient will be at risk due to having acute kidney injury.  This patient is unable to cut nails himself since the patient cannot reach his nails.These nails are painful walking and wearing shoes.  This patient presents for at risk foot care today.  General Appearance  Alert, conversant and in no acute stress.  Vascular  Dorsalis pedis and posterior tibial  pulses are palpable  bilaterally.  Capillary return is within normal limits  bilaterally. Temperature is within normal limits  bilaterally.  Neurologic  Senn-Weinstein monofilament wire test within normal limits  bilaterally. Muscle power within normal limits bilaterally.  Nails Thick disfigured discolored nails with subungual debris  from hallux to fifth toes bilaterally. No evidence of bacterial infection or drainage bilaterally.  Orthopedic  No limitations of motion  feet .  No crepitus or effusions noted.  No bony pathology or digital deformities noted.  Skin  normotropic skin with no porokeratosis noted bilaterally.  No signs of infections or ulcers noted.   Asymptomatic callus sub 4 left foot.  Onychomycosis  Pain in right toes  Pain in left toes  Consent was obtained for treatment procedures.   Mechanical debridement of nails 1-5  bilaterally performed with a nail nipper.  Filed with dremel without incident.    Return office visit   3 months                   Told patient to return for periodic foot care and evaluation due to potential at risk complications.   Ladeja Pelham DPM  

## 2020-08-15 ENCOUNTER — Ambulatory Visit: Payer: Medicare Other | Admitting: Podiatry

## 2020-09-10 ENCOUNTER — Emergency Department (HOSPITAL_COMMUNITY): Payer: Medicare Other

## 2020-09-10 ENCOUNTER — Emergency Department (HOSPITAL_COMMUNITY)
Admission: EM | Admit: 2020-09-10 | Discharge: 2020-09-10 | Disposition: A | Discharge status: Skilled Nursing Facility | Payer: Medicare Other | Attending: Emergency Medicine | Admitting: Emergency Medicine

## 2020-09-10 DIAGNOSIS — F17290 Nicotine dependence, other tobacco product, uncomplicated: Secondary | ICD-10-CM | POA: Diagnosis not present

## 2020-09-10 DIAGNOSIS — I10 Essential (primary) hypertension: Secondary | ICD-10-CM | POA: Diagnosis not present

## 2020-09-10 DIAGNOSIS — I2510 Atherosclerotic heart disease of native coronary artery without angina pectoris: Secondary | ICD-10-CM | POA: Insufficient documentation

## 2020-09-10 DIAGNOSIS — Z7982 Long term (current) use of aspirin: Secondary | ICD-10-CM | POA: Diagnosis not present

## 2020-09-10 DIAGNOSIS — M79605 Pain in left leg: Secondary | ICD-10-CM | POA: Diagnosis not present

## 2020-09-10 DIAGNOSIS — M25552 Pain in left hip: Secondary | ICD-10-CM | POA: Insufficient documentation

## 2020-09-10 DIAGNOSIS — W19XXXA Unspecified fall, initial encounter: Secondary | ICD-10-CM

## 2020-09-10 DIAGNOSIS — W010XXA Fall on same level from slipping, tripping and stumbling without subsequent striking against object, initial encounter: Secondary | ICD-10-CM | POA: Diagnosis not present

## 2020-09-10 DIAGNOSIS — Z79899 Other long term (current) drug therapy: Secondary | ICD-10-CM | POA: Insufficient documentation

## 2020-09-10 LAB — CBC WITH DIFFERENTIAL/PLATELET
Abs Immature Granulocytes: 0 10*3/uL (ref 0.00–0.07)
Basophils Absolute: 2.3 10*3/uL — ABNORMAL HIGH (ref 0.0–0.1)
Basophils Relative: 6 %
Eosinophils Absolute: 1.1 10*3/uL — ABNORMAL HIGH (ref 0.0–0.5)
Eosinophils Relative: 3 %
HCT: 40.9 % (ref 39.0–52.0)
Hemoglobin: 13.1 g/dL (ref 13.0–17.0)
Lymphocytes Relative: 3 %
Lymphs Abs: 1.1 10*3/uL (ref 0.7–4.0)
MCH: 29.7 pg (ref 26.0–34.0)
MCHC: 32 g/dL (ref 30.0–36.0)
MCV: 92.7 fL (ref 80.0–100.0)
Monocytes Absolute: 0.8 10*3/uL (ref 0.1–1.0)
Monocytes Relative: 2 %
Neutro Abs: 32 10*3/uL — ABNORMAL HIGH (ref 1.7–7.7)
Neutrophils Relative %: 86 %
Platelets: 930 10*3/uL (ref 150–400)
RBC: 4.41 MIL/uL (ref 4.22–5.81)
RDW: 18.5 % — ABNORMAL HIGH (ref 11.5–15.5)
WBC: 37.7 10*3/uL — ABNORMAL HIGH (ref 4.0–10.5)
nRBC: 0.1 % (ref 0.0–0.2)

## 2020-09-10 LAB — BASIC METABOLIC PANEL
Anion gap: 12 (ref 5–15)
BUN: 24 mg/dL — ABNORMAL HIGH (ref 8–23)
CO2: 23 mmol/L (ref 22–32)
Calcium: 9.1 mg/dL (ref 8.9–10.3)
Chloride: 107 mmol/L (ref 98–111)
Creatinine, Ser: 1.45 mg/dL — ABNORMAL HIGH (ref 0.61–1.24)
GFR, Estimated: 45 mL/min — ABNORMAL LOW (ref >60)
Glucose, Bld: 110 mg/dL — ABNORMAL HIGH (ref 70–99)
Potassium: 4.1 mmol/L (ref 3.5–5.1)
Sodium: 142 mmol/L (ref 135–145)

## 2020-09-10 LAB — CBC
HCT: 41.1 % (ref 39.0–52.0)
Hemoglobin: 13.1 g/dL (ref 13.0–17.0)
MCH: 29.4 pg (ref 26.0–34.0)
MCHC: 31.9 g/dL (ref 30.0–36.0)
MCV: 92.2 fL (ref 80.0–100.0)
Platelets: 895 10*3/uL — ABNORMAL HIGH (ref 150–400)
RBC: 4.46 MIL/uL (ref 4.22–5.81)
RDW: 18.5 % — ABNORMAL HIGH (ref 11.5–15.5)
WBC: 37.2 10*3/uL — ABNORMAL HIGH (ref 4.0–10.5)
nRBC: 0 % (ref 0.0–0.2)

## 2020-09-10 MED ORDER — MORPHINE SULFATE (PF) 2 MG/ML IV SOLN
2.0000 mg | Freq: Once | INTRAVENOUS | Status: DC
  Filled 2020-09-10: qty 1

## 2020-09-10 MED ORDER — ACETAMINOPHEN 500 MG PO TABS
1000.0000 mg | ORAL_TABLET | Freq: Once | ORAL | Status: AC
  Administered 2020-09-10: 1000 mg via ORAL
  Filled 2020-09-10: qty 2

## 2020-09-10 NOTE — ED Notes (Signed)
Pt's caretaker/family member called back wanting pt to be placed in cab after discharge. Explained that it is against policy and procedure to place a facility pt in a cab. Explained that pt must be transported by [facility] or by family/caregiver. Family does not want pt transported by [facility] due to costs and is unable to get pt if pt will be here much longer as she has "a plane to catch". Resident notified and will discharge pt as soon as he is able and family/caregiver is on the way to get pt.

## 2020-09-10 NOTE — Discharge Instructions (Addendum)
Dear Mr. Oneal,  Today we evaluated you in the emergency department for hip pain after a fall.  On evaluation you were also reporting that you had experienced some lightheadedness just prior to falling.  You also endorsed some chest pain to palpation today.  We obtained x-rays of your hip and chest to evaluate for possible fractures, fortunately these did not show any abnormalities.  We obtain a complete blood count and basic metabolic panel to evaluate for possible causes for your lightheadedness prior to falling.  The BMP did not show any likely cause, however your blood count showed a white blood cell count of 37.2.  The chest x-ray we obtained did not show any pneumonia.  It did show the pleural effusion which is resolving this is unlikely to be the cause of your elevated white blood cell count.  We attempted to obtain a urinalysis to evaluate for possible UTI, however we were not able to obtain a sample prior to your discharge.  For this reason we will not treat you with any antibiotics at this time. It will be very important to follow-up with your primary care physician in a timely manner.  They can further evaluate this elevation in your white blood cell count.  If you develop any increasing urinary symptoms fever nausea vomiting diarrhea please return to the emergency department immediately.

## 2020-09-10 NOTE — ED Notes (Signed)
Pt ambulated to the bathroom. Gait slow with shuffling motion. 2 assist for safety to bathroom, 1 assist for safety back to stretcher.

## 2020-09-10 NOTE — ED Notes (Signed)
Pt's caregiver/family here and wants pt discharged at this time

## 2020-09-10 NOTE — ED Provider Notes (Signed)
[facility] EMERGENCY DEPARTMENT Provider Note   CSN: [id number] Arrival date & time: 09/10/20  1026     History Chief Complaint  Patient presents with   [name] is a 87 y.o. male.  Who presents from [place] independent [facility] via EMS.  He had an unwitnessed fall earlier and fell on his left side.  He denies head injury but reports that he did scrape up his elbows bilaterally. He is complaining of pain in his left leg.    Fall      Past Medical History:  Diagnosis Date   BPH (benign prostatic hyperplasia)    Coronary artery disease with hx of myocardial infarct w/o hx of CABG 2010   Dehydration    Diverticulosis    HOH (hard of hearing)    hearing aids   Hyperlipidemia    Hypertension    Impaired fasting glucose    Memory difficulty 10/19/2012   Memory loss    Mesenteric panniculitis ([place])    Retinal tear    Severe aortic stenosis 2010   S/P AVR-TISSUE VALVE    Patient Active Problem List   Diagnosis Date Noted   Pain due to onychomycosis of toenails of both feet 07/13/2019   History of bradycardia 08/17/2018   Pleural effusion, left    Pleural effusion    Empyema, left ([place]) 05/04/2015   Empyema ([place])    CAP (community acquired pneumonia) 04/29/2015   Sepsis ([place]) 04/29/2015   Dyspnea 04/29/2015   Acute kidney injury ([place]) 04/29/2015   Tobacco abuse 04/29/2015   Bradycardia 03/08/2015   Coronary atherosclerosis of native coronary artery 01/25/2013   Aortic valve disorder 01/25/2013   Mixed hyperlipidemia 01/25/2013   Essential hypertension, benign 01/25/2013   Memory difficulty 10/19/2012    Past Surgical History:  Procedure Laterality Date   2 VESSEL CABG     AORTIC VALVE REPLACEMENT     BARTLE     CATARACT EXTRACTION, BILATERAL     RT 2010 LT 2014   TONSILLECTOMY         Family History  Problem Relation Age of Onset   Stroke Father    Cancer Brother    Hypertension Brother    Heart attack Neg Hx      Social History   Tobacco Use   Smoking status: Some Days    Types: Pipe   Smokeless tobacco: Never   Tobacco comments:    PIPE  Substance Use Topics   Alcohol use: No    Comment: RARELY   Drug use: No    Home Medications Prior to Admission medications   Medication Sig Start Date End Date Taking? Authorizing Provider  amLODipine (NORVASC) 10 MG tablet Take 5 mg by mouth daily.    [provider]  amoxicillin (AMOXIL) 500 MG tablet TAKE 4 TABLETS BY MOUTH 1 HOUR BEFORE DENTAL PROCEDURE 05/26/17   Jettie Booze, MD  aspirin EC 81 MG tablet Take 81 mg by mouth daily.    [provider]  atorvastatin (LIPITOR) 10 MG tablet Take 10 mg by mouth daily.    [provider]  furosemide (LASIX) 40 MG tablet Take 1 tablet (40 mg total) by mouth daily. 01/04/20 04/03/20  Kathyrn Drown D, NP  latanoprost (XALATAN) 0.005 % ophthalmic solution 1 drop daily. 07/12/19   [provider]  Multiple Vitamins-Minerals (MULTIVITAMIN WITH MINERALS) tablet Take 1 tablet by mouth daily.    [provider]  Allergies    Patient has no known allergies.  Review of Systems   Review of Systems  Constitutional:  Negative for chills and fever.  HENT: Negative.    Respiratory: Negative.    Cardiovascular: Negative.   Gastrointestinal: Negative.   Genitourinary:  Positive for frequency. Negative for difficulty urinating, dysuria, flank pain, hematuria and urgency.  Skin: Negative.   Neurological: Negative.   Psychiatric/Behavioral: Negative.     Physical Exam Updated Vital Signs BP (!) 168/74   Pulse 69   Temp 98.4 F (36.9 C) (Oral)   Resp 18   SpO2 96%   Physical Exam Constitutional:      Appearance: Normal appearance. He is normal weight.  HENT:     Head: Normocephalic and atraumatic.  Eyes:     Extraocular Movements: Extraocular movements intact.     Pupils: Pupils are equal, round, and reactive to light.  Cardiovascular:     Rate and  Rhythm: Normal rate and regular rhythm.     Pulses: Normal pulses.     Heart sounds: Normal heart sounds.  Pulmonary:     Effort: Pulmonary effort is normal.     Breath sounds: Normal breath sounds.  Abdominal:     General: Abdomen is flat. Bowel sounds are normal.     Palpations: Abdomen is soft.  Musculoskeletal:        General: No swelling, tenderness, deformity or signs of injury. Normal range of motion.     Cervical back: Normal range of motion and neck supple.     Right lower leg: No edema.     Left lower leg: No edema.  Skin:    General: Skin is warm and dry.  Neurological:     General: No focal deficit present.     Mental Status: He is alert and oriented to person, place, and time. Mental status is at baseline.  Psychiatric:        Mood and Affect: Mood normal.        Behavior: Behavior normal.    ED Results / Procedures / Treatments   Labs (all labs ordered are listed, but only abnormal results are displayed) Labs Reviewed  URINE CULTURE - Abnormal; Notable for the following components:      Result Value   Culture   (*)    Value: <10,000 COLONIES/mL INSIGNIFICANT GROWTH Performed at [facility], [address] [place], [place] [address]    All other components within normal limits  CBC - Abnormal; Notable for the following components:   WBC 37.2 (*)    RDW 18.5 (*)    Platelets 895 (*)    All other components within normal limits  BASIC METABOLIC PANEL - Abnormal; Notable for the following components:   Glucose, Bld 110 (*)    BUN 24 (*)    Creatinine, Ser 1.45 (*)    GFR, Estimated 45 (*)    All other components within normal limits  CBC WITH DIFFERENTIAL/PLATELET - Abnormal; Notable for the following components:   WBC 37.7 (*)    RDW 18.5 (*)    Platelets 930 (*)    Neutro Abs 32.0 (*)    Eosinophils Absolute 1.1 (*)    Basophils Absolute 2.3 (*)    All other components within normal limits  PATHOLOGIST SMEAR REVIEW     EKG None  Radiology DG Chest 1 View  Result Date: 09/10/2020 CLINICAL DATA:  Unwitnessed fall. EXAM: CHEST  1 VIEW COMPARISON:  Jun 08, 2015. FINDINGS:  Stable cardiomegaly. Status post coronary bypass graft and aortic valve repair. No pneumothorax is noted. Mild bibasilar subsegmental atelectasis or scarring is noted. Loculated left pleural effusion on prior exam is significantly smaller. Bony thorax is unremarkable. IMPRESSION: Mild bibasilar subsegmental atelectasis or scarring. Loculated left pleural effusion is significantly smaller compared to prior exam. Aortic Atherosclerosis (ICD10-I70.0). Electronically Signed   By: Marijo Conception M.D.   On: 09/10/2020 12:19   DG Hip Unilat W or Wo Pelvis 2-3 Views Left  Result Date: 09/10/2020 CLINICAL DATA:  Left hip pain after fall today. EXAM: DG HIP (WITH OR WITHOUT PELVIS) 2-3V LEFT COMPARISON:  None. FINDINGS: There is no evidence of hip fracture or dislocation. Mild narrowing with osteophyte formation is noted. IMPRESSION: Mild degenerative joint disease of the left hip. No acute abnormality is noted. Electronically Signed   By: Marijo Conception M.D.   On: 09/10/2020 12:17    Procedures Procedures   Medications Ordered in ED Medications  acetaminophen (TYLENOL) tablet 1,000 mg (1,000 mg Oral Given 09/10/20 1221)    ED Course  I have reviewed the triage vital signs and the nursing notes.  Pertinent labs & imaging results that were available during my care of the patient were reviewed by me and considered in my medical decision making (see chart for details).    MDM Rules/Calculators/A&P                          Patient presented with complaints of left hip pain after an unwitnessed fall at his nursing home [facility]. Patient reported that he had felt lightheaded just prior to fall. Denied hitting head. During evaluation in the ED, reported pain had improved some, but had some chest pain to tenderness with palpation. On exam, no  lower extremity edema, no erythema, or obvious deformity. ROM full with slight pain with internal rotation of the left leg. Mild tenderness to palpation chest. No focal deficits were noted and patient was alert and oriented.  X rays of chest and left hip were obtained. CXR showed no active cardiopulmonary disease and no obvious bone deformities. Xray of the hip showed some chronic degenerative changes but was negative for acute changes. Workup to evaluate for possible causes for fall were begun. EKG was unremarkable. BMP was not concerning for electrolyte abnormalities, did show elevation in Cr, possibly related to dehydration. CBC revealed WBC of 37.7 and platelets of 930. Discussed with patient and family at bedside that further workup for elevated WBC would be necessary to r/o underlying infection. Patient reported some increase in urinary frequency beginning approximately 2 weeks ago, however, no reports of dysuria. No fevers per patient. Patient had urinated prior to return of CBC showing elevated WBC, and when asked if he could provide a urine sample, patient and family at bedside refused to wait for him to do so and insisted that he be discharged immediately as family member had a flight at [place] and patient did not want to pay for cab fare/ ambulance fee. Discussed with patient and family the importance of needing to complete workup but they refused. Patient was discharged prior to completing workup for elevated WBC with instructions to return to the ED immediately if worsening symptoms. He will need to follow up with his PCP for further evaluation of elevated wbc and platelet..   Final Clinical Impression(s) / ED Diagnoses Final diagnoses:  Fall    Rx / DC Orders ED Discharge Orders  None        Delene Ruffini, MD 09/11/20 2145    Deno Etienne, DO 09/13/20 [phone]

## 2020-09-10 NOTE — ED Triage Notes (Signed)
Pt comes from [facility] independent [facility] via EMS. Pt had unwitnessed fall due to a slip and fall, fell on left side. Denies head injury, denies pain in head, neck, or back. Skin tears present on elbows bilaterally dressed by EMS. Only complaint is pain in left hip. Pt a/o x4. BP 200/72 HR 76 RR 18 O2 94% RA

## 2020-09-11 LAB — URINE CULTURE: Culture: <10000 — AB

## 2020-09-13 LAB — PATHOLOGIST SMEAR REVIEW

## 2020-09-29 ENCOUNTER — Emergency Department (HOSPITAL_COMMUNITY)
Admission: EM | Admit: 2020-09-29 | Discharge: 2020-09-30 | Disposition: A | Discharge status: Home / Self Care | Payer: Medicare Other | Source: Home / Self Care | Attending: Emergency Medicine | Admitting: Emergency Medicine

## 2020-09-29 ENCOUNTER — Other Ambulatory Visit: Payer: Self-pay

## 2020-09-29 ENCOUNTER — Emergency Department (HOSPITAL_COMMUNITY): Payer: Medicare Other

## 2020-09-29 ENCOUNTER — Encounter (HOSPITAL_COMMUNITY): Payer: Self-pay

## 2020-09-29 DIAGNOSIS — S50311A Abrasion of right elbow, initial encounter: Secondary | ICD-10-CM | POA: Insufficient documentation

## 2020-09-29 DIAGNOSIS — Z7982 Long term (current) use of aspirin: Secondary | ICD-10-CM | POA: Insufficient documentation

## 2020-09-29 DIAGNOSIS — Z951 Presence of aortocoronary bypass graft: Secondary | ICD-10-CM | POA: Insufficient documentation

## 2020-09-29 DIAGNOSIS — F17290 Nicotine dependence, other tobacco product, uncomplicated: Secondary | ICD-10-CM | POA: Insufficient documentation

## 2020-09-29 DIAGNOSIS — I10 Essential (primary) hypertension: Secondary | ICD-10-CM | POA: Insufficient documentation

## 2020-09-29 DIAGNOSIS — M545 Low back pain, unspecified: Secondary | ICD-10-CM | POA: Insufficient documentation

## 2020-09-29 DIAGNOSIS — Y9289 Other specified places as the place of occurrence of the external cause: Secondary | ICD-10-CM | POA: Insufficient documentation

## 2020-09-29 DIAGNOSIS — R0602 Shortness of breath: Secondary | ICD-10-CM | POA: Diagnosis not present

## 2020-09-29 DIAGNOSIS — I130 Hypertensive heart and chronic kidney disease with heart failure and stage 1 through stage 4 chronic kidney disease, or unspecified chronic kidney disease: Secondary | ICD-10-CM | POA: Diagnosis not present

## 2020-09-29 DIAGNOSIS — W19XXXA Unspecified fall, initial encounter: Secondary | ICD-10-CM | POA: Insufficient documentation

## 2020-09-29 DIAGNOSIS — R600 Localized edema: Secondary | ICD-10-CM | POA: Insufficient documentation

## 2020-09-29 DIAGNOSIS — Z79899 Other long term (current) drug therapy: Secondary | ICD-10-CM | POA: Insufficient documentation

## 2020-09-29 DIAGNOSIS — D72829 Elevated white blood cell count, unspecified: Secondary | ICD-10-CM | POA: Insufficient documentation

## 2020-09-29 DIAGNOSIS — F0390 Unspecified dementia without behavioral disturbance: Secondary | ICD-10-CM | POA: Insufficient documentation

## 2020-09-29 DIAGNOSIS — I2510 Atherosclerotic heart disease of native coronary artery without angina pectoris: Secondary | ICD-10-CM | POA: Insufficient documentation

## 2020-09-29 LAB — CBC WITH DIFFERENTIAL/PLATELET
Abs Immature Granulocytes: 1.86 10*3/uL — ABNORMAL HIGH (ref 0.00–0.07)
Basophils Absolute: 0.7 10*3/uL — ABNORMAL HIGH (ref 0.0–0.1)
Basophils Relative: 2 %
Eosinophils Absolute: 0.9 10*3/uL — ABNORMAL HIGH (ref 0.0–0.5)
Eosinophils Relative: 3 %
HCT: 38.5 % — ABNORMAL LOW (ref 39.0–52.0)
Hemoglobin: 12.2 g/dL — ABNORMAL LOW (ref 13.0–17.0)
Immature Granulocytes: 6 %
Lymphocytes Relative: 5 %
Lymphs Abs: 1.6 10*3/uL (ref 0.7–4.0)
MCH: 29.1 pg (ref 26.0–34.0)
MCHC: 31.7 g/dL (ref 30.0–36.0)
MCV: 91.9 fL (ref 80.0–100.0)
Monocytes Absolute: 1.2 10*3/uL — ABNORMAL HIGH (ref 0.1–1.0)
Monocytes Relative: 4 %
Neutro Abs: 26.5 10*3/uL — ABNORMAL HIGH (ref 1.7–7.7)
Neutrophils Relative %: 80 %
Platelets: 745 10*3/uL — ABNORMAL HIGH (ref 150–400)
RBC: 4.19 MIL/uL — ABNORMAL LOW (ref 4.22–5.81)
RDW: 18.6 % — ABNORMAL HIGH (ref 11.5–15.5)
WBC: 32.8 10*3/uL — ABNORMAL HIGH (ref 4.0–10.5)
nRBC: 0 % (ref 0.0–0.2)

## 2020-09-29 LAB — BASIC METABOLIC PANEL
Anion gap: 10 (ref 5–15)
BUN: 34 mg/dL — ABNORMAL HIGH (ref 8–23)
CO2: 21 mmol/L — ABNORMAL LOW (ref 22–32)
Calcium: 9 mg/dL (ref 8.9–10.3)
Chloride: 108 mmol/L (ref 98–111)
Creatinine, Ser: 1.63 mg/dL — ABNORMAL HIGH (ref 0.61–1.24)
GFR, Estimated: 40 mL/min — ABNORMAL LOW (ref >60)
Glucose, Bld: 106 mg/dL — ABNORMAL HIGH (ref 70–99)
Potassium: 4.6 mmol/L (ref 3.5–5.1)
Sodium: 139 mmol/L (ref 135–145)

## 2020-09-29 LAB — URINALYSIS, MICROSCOPIC (REFLEX)
Bacteria, UA: NONE SEEN
RBC / HPF: >50 RBC/hpf (ref 0–5)
Squamous Epithelial / HPF: NONE SEEN (ref 0–5)

## 2020-09-29 LAB — URINALYSIS, ROUTINE W REFLEX MICROSCOPIC
Bilirubin Urine: NEGATIVE
Glucose, UA: NEGATIVE mg/dL
Ketones, ur: NEGATIVE mg/dL
Leukocytes,Ua: NEGATIVE
Nitrite: NEGATIVE
Protein, ur: 30 mg/dL — AB
Specific Gravity, Urine: 1.02 (ref 1.005–1.030)
pH: 6 (ref 5.0–8.0)

## 2020-09-29 LAB — CK: Total CK: 102 U/L (ref 49–397)

## 2020-09-29 NOTE — ED Provider Notes (Addendum)
[facility] EMERGENCY DEPARTMENT Provider Note   CSN: [id number] Arrival date & time: 09/29/20  [id number]    History Chief Complaint  Patient presents with   [name] is a 87 y.o. male with medical history significant for CAD, hypertension, memory loss presents for evaluation of fall.  Patient is resident [place].  Per EMS patient sat down on floor last night and was unable to get up and was found this morning by assisted living facility.  Patient states he does not know why EMS was called.  He states he simply cannot get himself off the floor.  He states he did think that he fell however denies any syncope.  He states he has some pain to his right elbow and lower back.  He denies headache, lightheadedness, dizziness, chest pain, abdominal pain, unilateral weakness or numbness.  Level 5 caveat-memory difficulties  Per EMS wife states patient is at his baseline.  No anticoagulation.  Patient is able to tell me most of his history however does seem confused with what year it is however does state he knows he has history of heart failure takes "pee pills" as well as high blood pressure.  Attempted to contact emergency contact in [place] at [phone] ( disconnected) and [phone] (rang no VM) x 2  Attempt at Collateral from [place] at [facility] however unable to speak with nursing for collateral   HPI     Past Medical History:  Diagnosis Date   BPH (benign prostatic hyperplasia)    Coronary artery disease with hx of myocardial infarct w/o hx of CABG 2010   Dehydration    Diverticulosis    HOH (hard of hearing)    hearing aids   Hyperlipidemia    Hypertension    Impaired fasting glucose    Memory difficulty 10/19/2012   Memory loss    Mesenteric panniculitis ([place])    Retinal tear    Severe aortic stenosis 2010   S/P AVR-TISSUE VALVE    Patient Active Problem List   Diagnosis Date Noted   Pain due to onychomycosis of toenails of both  feet 07/13/2019   History of bradycardia 08/17/2018   Pleural effusion, left    Pleural effusion    Empyema, left ([place]) 05/04/2015   Empyema ([place])    CAP (community acquired pneumonia) 04/29/2015   Sepsis ([place]) 04/29/2015   Dyspnea 04/29/2015   Acute kidney injury ([place]) 04/29/2015   Tobacco abuse 04/29/2015   Bradycardia 03/08/2015   Coronary atherosclerosis of native coronary artery 01/25/2013   Aortic valve disorder 01/25/2013   Mixed hyperlipidemia 01/25/2013   Essential hypertension, benign 01/25/2013   Memory difficulty 10/19/2012    Past Surgical History:  Procedure Laterality Date   2 VESSEL CABG     AORTIC VALVE REPLACEMENT     BARTLE     CATARACT EXTRACTION, BILATERAL     RT 2010 LT 2014   TONSILLECTOMY         Family History  Problem Relation Age of Onset   Stroke Father    Cancer Brother    Hypertension Brother    Heart attack Neg Hx     Social History   Tobacco Use   Smoking status: Some Days    Types: Pipe   Smokeless tobacco: Never   Tobacco comments:    PIPE  Substance Use Topics   Alcohol use: No    Comment: RARELY   Drug use: No    Home Medications  Prior to Admission medications   Medication Sig Start Date End Date Taking? Authorizing Provider  amLODipine (NORVASC) 10 MG tablet Take 5 mg by mouth daily.    [provider]  amoxicillin (AMOXIL) 500 MG tablet TAKE 4 TABLETS BY MOUTH 1 HOUR BEFORE DENTAL PROCEDURE 05/26/17   Jettie Booze, MD  aspirin EC 81 MG tablet Take 81 mg by mouth daily.    [provider]  atorvastatin (LIPITOR) 10 MG tablet Take 10 mg by mouth daily.    [provider]  furosemide (LASIX) 40 MG tablet Take 1 tablet (40 mg total) by mouth daily. 01/04/20 04/03/20  Kathyrn Drown D, NP  latanoprost (XALATAN) 0.005 % ophthalmic solution 1 drop daily. 07/12/19   [provider]  Multiple Vitamins-Minerals (MULTIVITAMIN WITH MINERALS) tablet Take 1 tablet by mouth daily.    [provider]    Allergies    Patient has no known allergies.  Review of Systems   Review of Systems  Unable to perform ROS: Dementia  Constitutional: Negative.   HENT: Negative.    Respiratory: Negative.    Cardiovascular: Negative.   Gastrointestinal: Negative.   Genitourinary: Negative.   Musculoskeletal:  Positive for back pain.       Right elbow, low back pain  Skin:  Positive for wound.  Neurological: Negative.   All other systems reviewed and are negative.  Physical Exam Updated Vital Signs BP (!) 156/62   Pulse 85   Temp 97.9 F (36.6 C) (Oral)   Resp 16   Ht 6' (1.829 m)   Wt 71 kg   SpO2 92%   BMI 21.23 kg/m   Physical Exam Vitals and nursing note reviewed.  Constitutional:      General: He is not in acute distress.    Appearance: He is well-developed. He is not ill-appearing, toxic-appearing or diaphoretic.  HENT:     Head: Normocephalic and atraumatic.     Nose: Nose normal.     Mouth/Throat:     Mouth: Mucous membranes are moist.  Eyes:     Pupils: Pupils are equal, round, and reactive to light.  Neck:     Comments: C-collar present Cardiovascular:     Rate and Rhythm: Normal rate and regular rhythm.     Pulses: Normal pulses.     Heart sounds: Normal heart sounds.  Pulmonary:     Effort: Pulmonary effort is normal. No respiratory distress.     Breath sounds: Normal breath sounds and air entry.     Comments: Clear bilaterally.  Speaks in full sentences without difficulty Chest:     Comments: Nontender chest wall. Abdominal:     General: Bowel sounds are normal. There is no distension.     Palpations: Abdomen is soft.     Tenderness: There is no abdominal tenderness. There is no right CVA tenderness, left CVA tenderness, guarding or rebound.     Comments: Soft, nontender  Musculoskeletal:        General: Normal range of motion.     Cervical back: Normal range of motion and neck supple.     Comments: Mild tenderness to right elbow with  overlying abrasion no bony tenderness to shoulders, humerus, forearm.  Pelvis stable, nontender palpation.  Nontender bilateral femurs, knee, tib-fib and feet.  Nontender midline thoracic region.  Mild tenderness diffusely lumbar region.  Able to flex and extend at all major joints.  Skin:    General: Skin is warm and dry.  Comments: 2+ pitting edema to mid shins bilaterally  Neurological:     General: No focal deficit present.     Mental Status: He is alert and oriented to person, place, and time.     Comments: Pleasant Alert to person, place, date of birth however states year is [birth year] Follows commands Equal grip bilaterally No facial droop, intact sensation CN 2-12 grossly intact    ED Results / Procedures / Treatments   Labs (all labs ordered are listed, but only abnormal results are displayed) Labs Reviewed  CBC WITH DIFFERENTIAL/PLATELET - Abnormal; Notable for the following components:      Result Value   WBC 32.8 (*)    RBC 4.19 (*)    Hemoglobin 12.2 (*)    HCT 38.5 (*)    RDW 18.6 (*)    Platelets 745 (*)    Neutro Abs 26.5 (*)    Monocytes Absolute 1.2 (*)    Eosinophils Absolute 0.9 (*)    Basophils Absolute 0.7 (*)    Abs Immature Granulocytes 1.86 (*)    All other components within normal limits  BASIC METABOLIC PANEL - Abnormal; Notable for the following components:   CO2 21 (*)    Glucose, Bld 106 (*)    BUN 34 (*)    Creatinine, Ser 1.63 (*)    GFR, Estimated 40 (*)    All other components within normal limits  URINALYSIS, ROUTINE W REFLEX MICROSCOPIC - Abnormal; Notable for the following components:   Hgb urine dipstick LARGE (*)    Protein, ur 30 (*)    All other components within normal limits  CK  URINALYSIS, MICROSCOPIC (REFLEX)  PATHOLOGIST SMEAR REVIEW    EKG None  Radiology DG Chest 2 View  Result Date: 09/29/2020 CLINICAL DATA:  Fall EXAM: CHEST - 2 VIEW COMPARISON:  09/10/2020, 04/08/2015 FINDINGS: Post sternotomy changes.  Cardiomegaly with aortic atherosclerosis. Chronic pleural and parenchymal scarring at the left base. Mild interstitial prominence could be due to edema. Aortic valve prosthesis. No visible pneumothorax. IMPRESSION: 1. Cardiomegaly and mild diffuse interstitial opacity which could be due to mild edema 2. Chronic pleural and parenchymal scarring at left lung base. Electronically Signed   By: Donavan Foil M.D.   On: 09/29/2020 17:20   DG Lumbar Spine Complete  Result Date: 09/29/2020 CLINICAL DATA:  [place], back pain EXAM: LUMBAR SPINE - COMPLETE 4+ VIEW COMPARISON:  None. FINDINGS: Frontal, bilateral oblique, lateral views of the lumbar spine are obtained. There are 5 non-rib-bearing lumbar type vertebral bodies in normal anatomic alignment. No acute displaced fractures. There is prominent spondylosis at the thoracolumbar junction with bridging anterior osteophytes. Significant facet hypertrophy is seen within the lower lumbar spine greatest at L3-4, L4-5, and L5-S1. Sacroiliac joints are unremarkable. Visualized portions of the bony pelvis are normal. IMPRESSION: 1. Multilevel thoracolumbar spondylosis and facet hypertrophy. No acute bony abnormality. Electronically Signed   By: Randa Ngo M.D.   On: 09/29/2020 17:21   DG Pelvis 1-2 Views  Result Date: 09/29/2020 CLINICAL DATA:  Fall EXAM: PELVIS - 1-2 VIEW COMPARISON:  09/10/2020 FINDINGS: SI joints show mild degenerative change but are non widened. Pubic symphysis and rami appear intact. No definitive fracture or malalignment. An oblique lucency over the left femoral neck appears to represent a skin fold artifact. Moderate bilateral hip arthritis IMPRESSION: 1. No definite acute osseous abnormality. A lucency over the left femoral neck is suspected to be due to fold artifact 2. Cross-sectional imaging should be pursued if high  clinical suspicion for hip fracture Electronically Signed   By: Donavan Foil M.D.   On: 09/29/2020 17:37   DG Elbow Complete  Right  Result Date: 09/29/2020 CLINICAL DATA:  Fall with laceration EXAM: RIGHT ELBOW - COMPLETE 3+ VIEW COMPARISON:  None. FINDINGS: There is no evidence of fracture, or dislocation. Slightly limited lateral view due to nonstandard positioning. Posterior elbow swelling. No gross effusion IMPRESSION: No definite acute osseous abnormality Electronically Signed   By: Donavan Foil M.D.   On: 09/29/2020 17:21   CT HEAD WO CONTRAST (5MM)  Result Date: 09/29/2020 CLINICAL DATA:  A 87 year old male patient found on the floor, found down. EXAM: CT HEAD WITHOUT CONTRAST CT CERVICAL SPINE WITHOUT CONTRAST TECHNIQUE: Multidetector CT imaging of the head and cervical spine was performed following the standard protocol without intravenous contrast. Multiplanar CT image reconstructions of the cervical spine were also generated. COMPARISON:  Jun 15, 2015. FINDINGS: CT HEAD FINDINGS Brain: No evidence of acute infarction, hemorrhage, hydrocephalus, extra-axial collection or mass lesion/mass effect. Signs of marked atrophy and chronic microvascular ischemic change with worsening of atrophy since previous imaging. Vascular: No hyperdense vessel or unexpected calcification. Skull: Normal. Negative for fracture or focal lesion. Sinuses/Orbits: No acute finding. Other: None CT CERVICAL SPINE FINDINGS Alignment: Straightening of normal cervical lordotic curvature in the setting of marked spinal degenerative change. Skull base and vertebrae: No acute fracture. No primary bone lesion or focal pathologic process. Soft tissues and spinal canal: No prevertebral fluid or swelling. No visible canal hematoma. Disc levels: Multilevel degenerative changes with marked atlantoaxial degenerative changes. Marked multilevel facet arthropathy on the RIGHT at C2-3, C3-4, C4-5 and to a lesser extent C5-6. Marked disc space narrowing at C5-6 with anterior osteophytes and uncovertebral spurring. Facet arthropathy on the LEFT at this level and the  level below. Upper chest: Negative. Other: None IMPRESSION: No acute intracranial abnormality. Signs of marked atrophy and chronic microvascular ischemic change with worsening of atrophy since previous imaging. No evidence for acute fracture or static subluxation of the cervical spine. Marked multilevel degenerative changes and facet arthropathy of the cervical spine. Electronically Signed   By: Zetta Bills M.D.   On: 09/29/2020 16:20   CT Cervical Spine Wo Contrast  Result Date: 09/29/2020 CLINICAL DATA:  A 87 year old male patient found on the floor, found down. EXAM: CT HEAD WITHOUT CONTRAST CT CERVICAL SPINE WITHOUT CONTRAST TECHNIQUE: Multidetector CT imaging of the head and cervical spine was performed following the standard protocol without intravenous contrast. Multiplanar CT image reconstructions of the cervical spine were also generated. COMPARISON:  Jun 15, 2015. FINDINGS: CT HEAD FINDINGS Brain: No evidence of acute infarction, hemorrhage, hydrocephalus, extra-axial collection or mass lesion/mass effect. Signs of marked atrophy and chronic microvascular ischemic change with worsening of atrophy since previous imaging. Vascular: No hyperdense vessel or unexpected calcification. Skull: Normal. Negative for fracture or focal lesion. Sinuses/Orbits: No acute finding. Other: None CT CERVICAL SPINE FINDINGS Alignment: Straightening of normal cervical lordotic curvature in the setting of marked spinal degenerative change. Skull base and vertebrae: No acute fracture. No primary bone lesion or focal pathologic process. Soft tissues and spinal canal: No prevertebral fluid or swelling. No visible canal hematoma. Disc levels: Multilevel degenerative changes with marked atlantoaxial degenerative changes. Marked multilevel facet arthropathy on the RIGHT at C2-3, C3-4, C4-5 and to a lesser extent C5-6. Marked disc space narrowing at C5-6 with anterior osteophytes and uncovertebral spurring. Facet arthropathy on  the LEFT at this level and the level below.  Upper chest: Negative. Other: None IMPRESSION: No acute intracranial abnormality. Signs of marked atrophy and chronic microvascular ischemic change with worsening of atrophy since previous imaging. No evidence for acute fracture or static subluxation of the cervical spine. Marked multilevel degenerative changes and facet arthropathy of the cervical spine. Electronically Signed   By: Zetta Bills M.D.   On: 09/29/2020 16:20   DG Hip Unilat W or Wo Pelvis 2-3 Views Left  Result Date: 09/29/2020 CLINICAL DATA:  Fall EXAM: DG HIP (WITH OR WITHOUT PELVIS) 2-3V LEFT COMPARISON:  09/10/2020 FINDINGS: Left SI joint is non widened. No definitive fracture or malalignment. Moderate hip arthritis. IMPRESSION: No definite acute osseous abnormality. Electronically Signed   By: Donavan Foil M.D.   On: 09/29/2020 19:04    Procedures Procedures   Medications Ordered in ED Medications - No data to display  ED Course  I have reviewed the triage vital signs and the nursing notes.  Pertinent labs & imaging results that were available during my care of the patient were reviewed by me and considered in my medical decision making (see chart for details).  Here for evaluation of unwitnessed fall yesterday.  Patient with history of memory issues and according to EMS facility and wife state patient is at baseline.  Does have a mild abrasion to his right elbow however patient states this was from a fall a few weeks ago. He denies any complaints.  Has full range of motion to his joints.  Alert to person, place however not time which apparently is at baseline.  He has no other focal neurologic deficits.  Unclear as EMS stated patient sat on floor yesterday evening and was there overnight as he was not able to get up and found by nursing staff.  Patient states this is untrue stated he tripped and fell.  Plan on labs, imaging and reassess  Labs and imaging personally reviewed and  interpreted:  UA negative for infection Metabolic panel without electrolyte, renal or liver normality CK 102 CBC with leukocytosis at 32, hemoglobin 12.2>> leukocytosis was found in previous ED visit.  He apparently supposed to follow-up with PCP for this according to prior note.  Unclear if he has followed up with this  Clinical Course as of 09/29/20 2209  Sat Sep 29, 2020  1727 CT Cervical Spine Wo Contrast No acute abnormality [BH]  1727 CT HEAD WO CONTRAST (5MM) No acute abnormality [BH]  1727 DG Chest 2 View Cardiomegaly with possible mild edema [BH]  1727 DG Lumbar Spine Complete No acute bony abnormality [BH]  1727 DG Elbow Complete Right No acute abnormality [BH]  1911 DG Hip Unilat W or Wo Pelvis 2-3 Views Left No acute abnormality [BH]  1911 DG Pelvis 1-2 Views Possible overlap left hip, correlate clinically, no acute pelvic fracture [BH]    Clinical Course User Index [BH] Pryor Guettler A, PA-C    Patient reassessed.  He has no complaints.  He wants to go home.  I discussed his labs and imaging.  Unfortunately I have tried to contact patient's living facility as well as family multiple times with multiple numbers listed in epic.  Unable to get in contact with anyone.  Patient ambulatory here without difficulty. Tolerating PO intake.  Will DC back to nursing facility, will write on instructions patient needs close follow-up with primary care provider for further evaluation of his leukocytosis.  At this time I have low suspicion for sepsis.  He has no clear etiology of his leukocytosis.  Smear was sent for pathology review.  X-ray does show some mild pulmonary edema, patient states he has not taken his Lasix over the last few days.  Encouraged compliance with this.  He has no tachycardia, tachypnea, hypoxia with ambulation to suggest needing admission for acute fluid overload.  The patient has been appropriately medically screened and/or stabilized in the ED. I have low  suspicion for any other emergent medical condition which would require further screening, evaluation or treatment in the ED or require inpatient management.  Patient is hemodynamically stable and in no acute distress.  Patient able to ambulate in department prior to ED.  Evaluation does not show acute pathology that would require ongoing or additional emergent interventions while in the emergency department or further inpatient treatment.  I have discussed the diagnosis with the patient and answered all questions.  Pain is been managed while in the emergency department and patient has no further complaints prior to discharge.  Patient is comfortable with plan discussed in room and is stable for discharge at this time.  I have discussed strict return precautions for returning to the emergency department.  Patient was encouraged to follow-up with PCP/specialist refer to at discharge.  MDM Rules/Calculators/A&P                            Final Clinical Impression(s) / ED Diagnoses Final diagnoses:  Fall, initial encounter  Leukocytosis, unspecified type    Rx / DC Orders ED Discharge Orders     None           Furqan Gosselin A, PA-C 09/29/20 2210    Sherwood Gambler, MD 09/29/20 [phone]

## 2020-09-29 NOTE — ED Notes (Signed)
Patient transported to X-ray 

## 2020-09-29 NOTE — ED Notes (Signed)
Patient noted to be more drowsy than on arrival. Will arouse to verbal stimuli. Stretcher saturated with urine. Patient cleasnsed and dried. Stage 3 pressure ulcers noted to both sides of buttocks with drainage noted and no dressing present. Mepilex applied.

## 2020-09-29 NOTE — Discharge Instructions (Addendum)
Imaging did not show any significant abnormality  White count was elevated today at 32.  He needs follow-up for this with primary care provider  Return for new or worsening symptoms

## 2020-09-29 NOTE — ED Notes (Signed)
3rd? On PTAR list

## 2020-09-29 NOTE — ED Notes (Signed)
Pt ambulated around room with walker, denied pain.

## 2020-09-29 NOTE — ED Triage Notes (Addendum)
Patient arrives via EMS from [place] at [facility] due to being found on the floor at his facility. Per EMS, the patient sat down on the floor last night and was unable to get up. Patient was found this morning by assisted living staff. Patient has some history of memory loss, but wife states that he is at his baseline. Patient arrives with c-collar in place. Patient reports mild back pain. Does not take blood thinners.

## 2020-09-30 ENCOUNTER — Emergency Department (HOSPITAL_COMMUNITY): Payer: Medicare Other

## 2020-09-30 ENCOUNTER — Encounter (HOSPITAL_COMMUNITY): Payer: Self-pay | Admitting: Emergency Medicine

## 2020-09-30 ENCOUNTER — Inpatient Hospital Stay (HOSPITAL_COMMUNITY)
Admission: EM | Admit: 2020-09-30 | Discharge: 2020-10-03 | DRG: 280 | Disposition: A | Discharge status: Home Health | Payer: Medicare Other | Source: Skilled Nursing Facility | Attending: Internal Medicine | Admitting: Internal Medicine

## 2020-09-30 ENCOUNTER — Other Ambulatory Visit: Payer: Self-pay

## 2020-09-30 ENCOUNTER — Inpatient Hospital Stay (HOSPITAL_COMMUNITY): Payer: Medicare Other

## 2020-09-30 DIAGNOSIS — Z20822 Contact with and (suspected) exposure to covid-19: Secondary | ICD-10-CM | POA: Diagnosis present

## 2020-09-30 DIAGNOSIS — I509 Heart failure, unspecified: Secondary | ICD-10-CM | POA: Diagnosis not present

## 2020-09-30 DIAGNOSIS — Z953 Presence of xenogenic heart valve: Secondary | ICD-10-CM

## 2020-09-30 DIAGNOSIS — D75838 Other thrombocytosis: Secondary | ICD-10-CM | POA: Diagnosis present

## 2020-09-30 DIAGNOSIS — F0390 Unspecified dementia without behavioral disturbance: Secondary | ICD-10-CM | POA: Diagnosis not present

## 2020-09-30 DIAGNOSIS — N400 Enlarged prostate without lower urinary tract symptoms: Secondary | ICD-10-CM | POA: Diagnosis present

## 2020-09-30 DIAGNOSIS — N1832 Chronic kidney disease, stage 3b: Secondary | ICD-10-CM

## 2020-09-30 DIAGNOSIS — I252 Old myocardial infarction: Secondary | ICD-10-CM

## 2020-09-30 DIAGNOSIS — F0391 Unspecified dementia with behavioral disturbance: Secondary | ICD-10-CM | POA: Diagnosis present

## 2020-09-30 DIAGNOSIS — N179 Acute kidney failure, unspecified: Secondary | ICD-10-CM | POA: Diagnosis present

## 2020-09-30 DIAGNOSIS — Z809 Family history of malignant neoplasm, unspecified: Secondary | ICD-10-CM | POA: Diagnosis not present

## 2020-09-30 DIAGNOSIS — E7800 Pure hypercholesterolemia, unspecified: Secondary | ICD-10-CM | POA: Diagnosis not present

## 2020-09-30 DIAGNOSIS — Z7982 Long term (current) use of aspirin: Secondary | ICD-10-CM

## 2020-09-30 DIAGNOSIS — L89322 Pressure ulcer of left buttock, stage 2: Secondary | ICD-10-CM | POA: Diagnosis present

## 2020-09-30 DIAGNOSIS — Z79899 Other long term (current) drug therapy: Secondary | ICD-10-CM

## 2020-09-30 DIAGNOSIS — I10 Essential (primary) hypertension: Secondary | ICD-10-CM | POA: Diagnosis not present

## 2020-09-30 DIAGNOSIS — Z66 Do not resuscitate: Secondary | ICD-10-CM | POA: Diagnosis present

## 2020-09-30 DIAGNOSIS — I5032 Chronic diastolic (congestive) heart failure: Secondary | ICD-10-CM

## 2020-09-30 DIAGNOSIS — E782 Mixed hyperlipidemia: Secondary | ICD-10-CM | POA: Diagnosis present

## 2020-09-30 DIAGNOSIS — R7989 Other specified abnormal findings of blood chemistry: Secondary | ICD-10-CM | POA: Diagnosis not present

## 2020-09-30 DIAGNOSIS — I214 Non-ST elevation (NSTEMI) myocardial infarction: Secondary | ICD-10-CM

## 2020-09-30 DIAGNOSIS — Z23 Encounter for immunization: Secondary | ICD-10-CM | POA: Diagnosis present

## 2020-09-30 DIAGNOSIS — W19XXXA Unspecified fall, initial encounter: Secondary | ICD-10-CM

## 2020-09-30 DIAGNOSIS — I2510 Atherosclerotic heart disease of native coronary artery without angina pectoris: Secondary | ICD-10-CM | POA: Diagnosis present

## 2020-09-30 DIAGNOSIS — R778 Other specified abnormalities of plasma proteins: Secondary | ICD-10-CM

## 2020-09-30 DIAGNOSIS — D72829 Elevated white blood cell count, unspecified: Secondary | ICD-10-CM | POA: Diagnosis not present

## 2020-09-30 DIAGNOSIS — I5033 Acute on chronic diastolic (congestive) heart failure: Secondary | ICD-10-CM | POA: Diagnosis present

## 2020-09-30 DIAGNOSIS — R0602 Shortness of breath: Secondary | ICD-10-CM | POA: Diagnosis present

## 2020-09-30 DIAGNOSIS — L8990 Pressure ulcer of unspecified site, unspecified stage: Secondary | ICD-10-CM | POA: Insufficient documentation

## 2020-09-30 DIAGNOSIS — Z8249 Family history of ischemic heart disease and other diseases of the circulatory system: Secondary | ICD-10-CM

## 2020-09-30 DIAGNOSIS — L89312 Pressure ulcer of right buttock, stage 2: Secondary | ICD-10-CM | POA: Diagnosis present

## 2020-09-30 DIAGNOSIS — C9510 Chronic leukemia of unspecified cell type not having achieved remission: Secondary | ICD-10-CM | POA: Diagnosis present

## 2020-09-30 DIAGNOSIS — Z823 Family history of stroke: Secondary | ICD-10-CM | POA: Diagnosis not present

## 2020-09-30 DIAGNOSIS — I21A1 Myocardial infarction type 2: Secondary | ICD-10-CM | POA: Diagnosis present

## 2020-09-30 DIAGNOSIS — R55 Syncope and collapse: Secondary | ICD-10-CM

## 2020-09-30 DIAGNOSIS — R296 Repeated falls: Secondary | ICD-10-CM | POA: Diagnosis present

## 2020-09-30 DIAGNOSIS — I5031 Acute diastolic (congestive) heart failure: Secondary | ICD-10-CM | POA: Diagnosis not present

## 2020-09-30 DIAGNOSIS — D72825 Bandemia: Secondary | ICD-10-CM | POA: Diagnosis not present

## 2020-09-30 DIAGNOSIS — Z951 Presence of aortocoronary bypass graft: Secondary | ICD-10-CM

## 2020-09-30 DIAGNOSIS — R1310 Dysphagia, unspecified: Secondary | ICD-10-CM | POA: Diagnosis present

## 2020-09-30 DIAGNOSIS — I130 Hypertensive heart and chronic kidney disease with heart failure and stage 1 through stage 4 chronic kidney disease, or unspecified chronic kidney disease: Secondary | ICD-10-CM | POA: Diagnosis present

## 2020-09-30 DIAGNOSIS — F17290 Nicotine dependence, other tobacco product, uncomplicated: Secondary | ICD-10-CM | POA: Diagnosis present

## 2020-09-30 DIAGNOSIS — I2583 Coronary atherosclerosis due to lipid rich plaque: Secondary | ICD-10-CM | POA: Diagnosis not present

## 2020-09-30 LAB — COMPREHENSIVE METABOLIC PANEL
ALT: 20 U/L (ref 0–44)
AST: 56 U/L — ABNORMAL HIGH (ref 15–41)
Albumin: 4 g/dL (ref 3.5–5.0)
Alkaline Phosphatase: 89 U/L (ref 38–126)
Anion gap: 10 (ref 5–15)
BUN: 38 mg/dL — ABNORMAL HIGH (ref 8–23)
CO2: 23 mmol/L (ref 22–32)
Calcium: 9.4 mg/dL (ref 8.9–10.3)
Chloride: 110 mmol/L (ref 98–111)
Creatinine, Ser: 1.78 mg/dL — ABNORMAL HIGH (ref 0.61–1.24)
GFR, Estimated: 36 mL/min — ABNORMAL LOW (ref >60)
Glucose, Bld: 125 mg/dL — ABNORMAL HIGH (ref 70–99)
Potassium: 4.5 mmol/L (ref 3.5–5.1)
Sodium: 143 mmol/L (ref 135–145)
Total Bilirubin: 1.5 mg/dL — ABNORMAL HIGH (ref 0.3–1.2)
Total Protein: 7.9 g/dL (ref 6.5–8.1)

## 2020-09-30 LAB — CBC
HCT: 38.6 % — ABNORMAL LOW (ref 39.0–52.0)
Hemoglobin: 12.8 g/dL — ABNORMAL LOW (ref 13.0–17.0)
MCH: 29.8 pg (ref 26.0–34.0)
MCHC: 33.2 g/dL (ref 30.0–36.0)
MCV: 89.8 fL (ref 80.0–100.0)
Platelets: 756 10*3/uL — ABNORMAL HIGH (ref 150–400)
RBC: 4.3 MIL/uL (ref 4.22–5.81)
RDW: 18.8 % — ABNORMAL HIGH (ref 11.5–15.5)
WBC: 43.6 10*3/uL — ABNORMAL HIGH (ref 4.0–10.5)
nRBC: 0 % (ref 0.0–0.2)

## 2020-09-30 LAB — DIFFERENTIAL
Abs Immature Granulocytes: 2.12 10*3/uL — ABNORMAL HIGH (ref 0.00–0.07)
Basophils Absolute: 0.8 10*3/uL — ABNORMAL HIGH (ref 0.0–0.1)
Basophils Relative: 2 %
Eosinophils Absolute: 0.4 10*3/uL (ref 0.0–0.5)
Eosinophils Relative: 1 %
Immature Granulocytes: 5 %
Lymphocytes Relative: 4 %
Lymphs Abs: 1.9 10*3/uL (ref 0.7–4.0)
Monocytes Absolute: 1.5 10*3/uL — ABNORMAL HIGH (ref 0.1–1.0)
Monocytes Relative: 4 %
Neutro Abs: 36.9 10*3/uL — ABNORMAL HIGH (ref 1.7–7.7)
Neutrophils Relative %: 84 %

## 2020-09-30 LAB — URINALYSIS, ROUTINE W REFLEX MICROSCOPIC
Bacteria, UA: NONE SEEN
Bilirubin Urine: NEGATIVE
Glucose, UA: NEGATIVE mg/dL
Leukocytes,Ua: NEGATIVE
Nitrite: NEGATIVE
Protein, ur: 100 mg/dL — AB
RBC / HPF: >50 RBC/hpf — ABNORMAL HIGH (ref 0–5)
Specific Gravity, Urine: 1.025 (ref 1.005–1.030)
pH: 5.5 (ref 5.0–8.0)

## 2020-09-30 LAB — TROPONIN I (HIGH SENSITIVITY)
Troponin I (High Sensitivity): 1908 ng/L (ref <18)
Troponin I (High Sensitivity): 2850 ng/L (ref <18)

## 2020-09-30 LAB — BRAIN NATRIURETIC PEPTIDE: B Natriuretic Peptide: 2356.4 pg/mL — ABNORMAL HIGH (ref 0.0–100.0)

## 2020-09-30 LAB — RESP PANEL BY RT-PCR (FLU A&B, COVID) ARPGX2
Influenza A by PCR: NEGATIVE
Influenza B by PCR: NEGATIVE
SARS Coronavirus 2 by RT PCR: NEGATIVE

## 2020-09-30 LAB — LACTIC ACID, PLASMA
Lactic Acid, Venous: 1.7 mmol/L (ref 0.5–1.9)
Lactic Acid, Venous: 1.4 mmol/L (ref 0.5–1.9)

## 2020-09-30 LAB — PROCALCITONIN: Procalcitonin: 0.23 ng/mL

## 2020-09-30 MED ORDER — SODIUM CHLORIDE 0.9 % IV SOLN
2.0000 g | INTRAVENOUS | Status: DC
  Administered 2020-09-30: 2 g via INTRAVENOUS
  Filled 2020-09-30 (×2): qty 2

## 2020-09-30 MED ORDER — FUROSEMIDE 10 MG/ML IJ SOLN
40.0000 mg | Freq: Once | INTRAMUSCULAR | Status: AC
  Administered 2020-09-30: 40 mg via INTRAVENOUS
  Filled 2020-09-30: qty 4

## 2020-09-30 MED ORDER — ASPIRIN EC 81 MG PO TBEC
81.0000 mg | DELAYED_RELEASE_TABLET | Freq: Every day | ORAL | Status: DC
  Administered 2020-10-01: 81 mg via ORAL
  Filled 2020-09-30 (×2): qty 1

## 2020-09-30 MED ORDER — IOHEXOL 350 MG/ML SOLN
60.0000 mL | Freq: Once | INTRAVENOUS | Status: AC | PRN
  Administered 2020-09-30: 60 mL via INTRAVENOUS

## 2020-09-30 MED ORDER — ACETAMINOPHEN 325 MG PO TABS
650.0000 mg | ORAL_TABLET | Freq: Four times a day (QID) | ORAL | Status: DC | PRN
  Administered 2020-10-02: 650 mg via ORAL
  Filled 2020-09-30: qty 2

## 2020-09-30 MED ORDER — TETANUS-DIPHTH-ACELL PERTUSSIS 5-2.5-18.5 LF-MCG/0.5 IM SUSY
0.5000 mL | PREFILLED_SYRINGE | Freq: Once | INTRAMUSCULAR | Status: AC
  Administered 2020-09-30: 0.5 mL via INTRAMUSCULAR
  Filled 2020-09-30: qty 0.5

## 2020-09-30 MED ORDER — VANCOMYCIN HCL 1500 MG/300ML IV SOLN
1500.0000 mg | Freq: Once | INTRAVENOUS | Status: AC
  Administered 2020-09-30: 1500 mg via INTRAVENOUS
  Filled 2020-09-30 (×2): qty 300

## 2020-09-30 MED ORDER — ASPIRIN 325 MG PO TABS
325.0000 mg | ORAL_TABLET | Freq: Once | ORAL | Status: AC
  Administered 2020-09-30: 325 mg via ORAL
  Filled 2020-09-30: qty 1

## 2020-09-30 MED ORDER — ENOXAPARIN SODIUM 30 MG/0.3ML IJ SOSY
30.0000 mg | PREFILLED_SYRINGE | INTRAMUSCULAR | Status: DC
  Administered 2020-09-30 – 2020-10-02 (×3): 30 mg via SUBCUTANEOUS
  Filled 2020-09-30 (×3): qty 0.3

## 2020-09-30 MED ORDER — ASPIRIN 325 MG PO TABS: 325.0000 mg | ORAL_TABLET | Freq: Every day | ORAL | Status: DC

## 2020-09-30 MED ORDER — ACETAMINOPHEN 650 MG RE SUPP: 650.0000 mg | Freq: Four times a day (QID) | RECTAL | Status: DC | PRN

## 2020-09-30 MED ORDER — VANCOMYCIN HCL 1250 MG/250ML IV SOLN: 1250.0000 mg | INTRAVENOUS | Status: DC

## 2020-09-30 MED ORDER — ATORVASTATIN CALCIUM 20 MG PO TABS
20.0000 mg | ORAL_TABLET | Freq: Every day | ORAL | Status: DC
  Administered 2020-10-01 – 2020-10-03 (×3): 20 mg via ORAL
  Filled 2020-09-30 (×3): qty 1

## 2020-09-30 NOTE — ED Provider Notes (Addendum)
[facility] DEPT Provider Note   CSN: [id number] Arrival date & time: 09/30/20  [id number]     History Chief Complaint  Patient presents with   Fall   Back Pain    [name] is a 87 y.o. male.   Fall  Back Pain  87 year old male with a history of CAD status post CABG, diverticulosis, HLD, HTN, memory loss, severe aortic stenosis status post aortic valve replacement, BPH who presents emergency department with a fall and back pain that was unwitnessed earlier today.  The patient states that he remembers the fall.  He thinks that he was getting out of bed and possibly tripped or passed out.  He does remember going down to the ground but then does not remember much else afterwards.  He thinks he subsequently lost consciousness.   Of note, the patient has a history of memory difficulties/dementia. I believe him to be a poor historian. Level 5 caveat applies. To the best I am able to discern, the patient's current only complaint is back pain, and pain in the vicinity of his elbow abrasions. He arrived GCS14, ABC intact. I attempted to contact the patient's wife twice via contact numbers listed in the EMR without success.  Past Medical History:  Diagnosis Date   BPH (benign prostatic hyperplasia)    Coronary artery disease with hx of myocardial infarct w/o hx of CABG 2010   Dehydration    Diverticulosis    HOH (hard of hearing)    hearing aids   Hyperlipidemia    Hypertension    Impaired fasting glucose    Memory difficulty 10/19/2012   Memory loss    Mesenteric panniculitis ([place])    Retinal tear    Severe aortic stenosis 2010   S/P AVR-TISSUE VALVE    Patient Active Problem List   Diagnosis Date Noted   NSTEMI (non-ST elevated myocardial infarction) ([place]) 09/30/2020   Pressure injury of skin 09/30/2020   Pain due to onychomycosis of toenails of both feet 07/13/2019   History of bradycardia 08/17/2018   Pleural effusion, left    Pleural  effusion    Empyema, left ([place]) 05/04/2015   Empyema ([place])    CAP (community acquired pneumonia) 04/29/2015   Sepsis ([place]) 04/29/2015   Dyspnea 04/29/2015   Acute kidney injury ([place]) 04/29/2015   Tobacco abuse 04/29/2015   Bradycardia 03/08/2015   Coronary atherosclerosis of native coronary artery 01/25/2013   Aortic valve disorder 01/25/2013   Mixed hyperlipidemia 01/25/2013   Essential hypertension, benign 01/25/2013   Memory difficulty 10/19/2012    Past Surgical History:  Procedure Laterality Date   2 VESSEL CABG     AORTIC VALVE REPLACEMENT     BARTLE     CATARACT EXTRACTION, BILATERAL     RT 2010 LT 2014   TONSILLECTOMY         Family History  Problem Relation Age of Onset   Stroke Father    Cancer Brother    Hypertension Brother    Heart attack Neg Hx     Social History   Tobacco Use   Smoking status: Some Days    Types: Pipe   Smokeless tobacco: Never   Tobacco comments:    PIPE  Substance Use Topics   Alcohol use: No    Comment: RARELY   Drug use: No    Home Medications Prior to Admission medications   Medication Sig Start Date End Date Taking? Authorizing Provider  amLODipine (NORVASC) 10 MG tablet  Take 5 mg by mouth daily.    [provider]  amoxicillin (AMOXIL) 500 MG tablet TAKE 4 TABLETS BY MOUTH 1 HOUR BEFORE DENTAL PROCEDURE 05/26/17   Jettie Booze, MD  aspirin EC 81 MG tablet Take 81 mg by mouth daily.    [provider]  atorvastatin (LIPITOR) 10 MG tablet Take 10 mg by mouth daily.    [provider]  furosemide (LASIX) 40 MG tablet Take 1 tablet (40 mg total) by mouth daily. 01/04/20 04/03/20  Kathyrn Drown D, NP  latanoprost (XALATAN) 0.005 % ophthalmic solution 1 drop daily. 07/12/19   [provider]  Multiple Vitamins-Minerals (MULTIVITAMIN WITH MINERALS) tablet Take 1 tablet by mouth daily.    [provider]    Allergies    Patient has no known allergies.  Review of Systems    Review of Systems  Unable to perform ROS: Dementia  Musculoskeletal:  Positive for back pain.   Physical Exam Updated Vital Signs BP 138/78   Pulse (!) 53   Temp (!) 97.5 F (36.4 C) (Oral)   Resp 16   Wt 66.9 kg   SpO2 94%   BMI 20.00 kg/m   Physical Exam Vitals and nursing note reviewed.  Constitutional:      Appearance: He is well-developed.     Comments: GCS 15, ABC intact  HENT:     Head: Normocephalic.  Eyes:     Conjunctiva/sclera: Conjunctivae normal.  Neck:     Comments: No midline tenderness to palpation of the cervical spine. ROM intact. C-collar in place Cardiovascular:     Rate and Rhythm: Normal rate and regular rhythm.     Heart sounds: No murmur heard. Pulmonary:     Effort: Pulmonary effort is normal. No respiratory distress.     Breath sounds: Normal breath sounds.  Chest:     Comments: Chest wall stable and non-tender to AP and lateral compression. Clavicles stable and non-tender to AP compression Abdominal:     Palpations: Abdomen is soft.     Tenderness: There is no abdominal tenderness.     Comments: Pelvis stable to lateral compression.  Musculoskeletal:     Cervical back: Neck supple.     Right lower leg: Edema present.     Left lower leg: Edema present.     Comments: No midline tenderness to palpation of the thoracic or lumbar spine. Abrasions noted to the elbows bilaterally.  2+ pitting edema of the lower extremities.  Skin:    General: Skin is warm and dry.  Neurological:     Mental Status: He is alert.     Comments: CN II-XII grossly intact. Moving all four extremities spontaneously and sensation grossly intact.    ED Results / Procedures / Treatments   Labs (all labs ordered are listed, but only abnormal results are displayed) Labs Reviewed  COMPREHENSIVE METABOLIC PANEL - Abnormal; Notable for the following components:      Result Value   Glucose, Bld 125 (*)    BUN 38 (*)    Creatinine, Ser 1.78 (*)    AST 56 (*)    Total  Bilirubin 1.5 (*)    GFR, Estimated 36 (*)    All other components within normal limits  CBC - Abnormal; Notable for the following components:   WBC 43.6 (*)    Hemoglobin 12.8 (*)    HCT 38.6 (*)    RDW 18.8 (*)    Platelets 756 (*)    All  other components within normal limits  URINALYSIS, ROUTINE W REFLEX MICROSCOPIC - Abnormal; Notable for the following components:   Color, Urine YELLOW (*)    APPearance CLEAR (*)    Hgb urine dipstick LARGE (*)    Ketones, ur TRACE (*)    Protein, ur 100 (*)    RBC / HPF >50 (*)    All other components within normal limits  BRAIN NATRIURETIC PEPTIDE - Abnormal; Notable for the following components:   B Natriuretic Peptide 2,356.4 (*)    All other components within normal limits  TROPONIN I (HIGH SENSITIVITY) - Abnormal; Notable for the following components:   Troponin I (High Sensitivity) 1,908 (*)    All other components within normal limits  TROPONIN I (HIGH SENSITIVITY) - Abnormal; Notable for the following components:   Troponin I (High Sensitivity) 2,850 (*)    All other components within normal limits  RESP PANEL BY RT-PCR (FLU A&B, COVID) [id number]  URINE CULTURE  CULTURE, BLOOD (ROUTINE X 2)  CULTURE, BLOOD (ROUTINE X 2)  BASIC METABOLIC PANEL  CBC  DIFFERENTIAL  LACTIC ACID, PLASMA  LACTIC ACID, PLASMA  PROCALCITONIN  PROCALCITONIN    EKG EKG Interpretation  Date/Time:  Sunday September 30 2020 11:35:21 EDT Ventricular Rate:  82 PR Interval:  206 QRS Duration: 83 QT Interval:  356 QTC Calculation: 416 R Axis:     Text Interpretation: Normal sinus rhythm No STEMI Confirmed by Regan Lemming ([id number]) on 09/30/2020 1:35:48 PM  Radiology DG Chest 2 View  Result Date: 09/29/2020 CLINICAL DATA:  Fall EXAM: CHEST - 2 VIEW COMPARISON:  09/10/2020, 04/08/2015 FINDINGS: Post sternotomy changes. Cardiomegaly with aortic atherosclerosis. Chronic pleural and parenchymal scarring at the left base. Mild interstitial prominence could be  due to edema. Aortic valve prosthesis. No visible pneumothorax. IMPRESSION: 1. Cardiomegaly and mild diffuse interstitial opacity which could be due to mild edema 2. Chronic pleural and parenchymal scarring at left lung base. Electronically Signed   By: Donavan Foil M.D.   On: 09/29/2020 17:20   DG Lumbar Spine Complete  Result Date: 09/29/2020 CLINICAL DATA:  [place], back pain EXAM: LUMBAR SPINE - COMPLETE 4+ VIEW COMPARISON:  None. FINDINGS: Frontal, bilateral oblique, lateral views of the lumbar spine are obtained. There are 5 non-rib-bearing lumbar type vertebral bodies in normal anatomic alignment. No acute displaced fractures. There is prominent spondylosis at the thoracolumbar junction with bridging anterior osteophytes. Significant facet hypertrophy is seen within the lower lumbar spine greatest at L3-4, L4-5, and L5-S1. Sacroiliac joints are unremarkable. Visualized portions of the bony pelvis are normal. IMPRESSION: 1. Multilevel thoracolumbar spondylosis and facet hypertrophy. No acute bony abnormality. Electronically Signed   By: Randa Ngo M.D.   On: 09/29/2020 17:21   DG Pelvis 1-2 Views  Result Date: 09/29/2020 CLINICAL DATA:  Fall EXAM: PELVIS - 1-2 VIEW COMPARISON:  09/10/2020 FINDINGS: SI joints show mild degenerative change but are non widened. Pubic symphysis and rami appear intact. No definitive fracture or malalignment. An oblique lucency over the left femoral neck appears to represent a skin fold artifact. Moderate bilateral hip arthritis IMPRESSION: 1. No definite acute osseous abnormality. A lucency over the left femoral neck is suspected to be due to fold artifact 2. Cross-sectional imaging should be pursued if high clinical suspicion for hip fracture Electronically Signed   By: Donavan Foil M.D.   On: 09/29/2020 17:37   DG Elbow Complete Left  Result Date: 09/30/2020 CLINICAL DATA:  Fall in a 87 year old male with bilateral skin  tears to the elbows. EXAM: LEFT ELBOW -  COMPLETE 3+ VIEW COMPARISON:  Contralateral elbow FINDINGS: Small well corticated bone fragments adjacent to the lateral epicondyle. No acute fracture or dislocation. No substantial soft tissue swelling or sign of joint effusion. IMPRESSION: No acute fracture or traumatic malalignment. Small well corticated bone fragments adjacent to the lateral epicondyle may reflect remote injury or sequela of prior epicondylitis. Electronically Signed   By: Zetta Bills M.D.   On: 09/30/2020 12:08   DG Elbow Complete Right  Result Date: 09/30/2020 CLINICAL DATA:  Right elbow pain and skin tears following a fall. EXAM: RIGHT ELBOW - COMPLETE 3+ VIEW COMPARISON:  09/29/2020 FINDINGS: Decreased posterior elbow swelling. No fracture, dislocation or effusion. IMPRESSION: No fracture or dislocation. Electronically Signed   By: Claudie Revering M.D.   On: 09/30/2020 12:12   DG Elbow Complete Right  Result Date: 09/29/2020 CLINICAL DATA:  Fall with laceration EXAM: RIGHT ELBOW - COMPLETE 3+ VIEW COMPARISON:  None. FINDINGS: There is no evidence of fracture, or dislocation. Slightly limited lateral view due to nonstandard positioning. Posterior elbow swelling. No gross effusion IMPRESSION: No definite acute osseous abnormality Electronically Signed   By: Donavan Foil M.D.   On: 09/29/2020 17:21   CT HEAD WO CONTRAST  Result Date: 09/30/2020 CLINICAL DATA:  Head trauma. EXAM: CT HEAD WITHOUT CONTRAST TECHNIQUE: Contiguous axial images were obtained from the base of the skull through the vertex without intravenous contrast. COMPARISON:  September 29, 2020 FINDINGS: Brain: No evidence of acute infarction, hemorrhage, hydrocephalus, extra-axial collection or mass lesion/mass effect. Moderate brain parenchymal volume loss and deep white matter microangiopathy. Vascular: No hyperdense vessel or unexpected calcification. Skull: Normal. Negative for fracture or focal lesion. Sinuses/Orbits: No acute finding. Other: None. IMPRESSION: 1.  No acute intracranial abnormality. 2. Moderate brain parenchymal atrophy and chronic microvascular disease. Electronically Signed   By: Fidela Salisbury M.D.   On: 09/30/2020 14:21   CT HEAD WO CONTRAST (5MM)  Result Date: 09/29/2020 CLINICAL DATA:  A 87 year old male patient found on the floor, found down. EXAM: CT HEAD WITHOUT CONTRAST CT CERVICAL SPINE WITHOUT CONTRAST TECHNIQUE: Multidetector CT imaging of the head and cervical spine was performed following the standard protocol without intravenous contrast. Multiplanar CT image reconstructions of the cervical spine were also generated. COMPARISON:  Jun 15, 2015. FINDINGS: CT HEAD FINDINGS Brain: No evidence of acute infarction, hemorrhage, hydrocephalus, extra-axial collection or mass lesion/mass effect. Signs of marked atrophy and chronic microvascular ischemic change with worsening of atrophy since previous imaging. Vascular: No hyperdense vessel or unexpected calcification. Skull: Normal. Negative for fracture or focal lesion. Sinuses/Orbits: No acute finding. Other: None CT CERVICAL SPINE FINDINGS Alignment: Straightening of normal cervical lordotic curvature in the setting of marked spinal degenerative change. Skull base and vertebrae: No acute fracture. No primary bone lesion or focal pathologic process. Soft tissues and spinal canal: No prevertebral fluid or swelling. No visible canal hematoma. Disc levels: Multilevel degenerative changes with marked atlantoaxial degenerative changes. Marked multilevel facet arthropathy on the RIGHT at C2-3, C3-4, C4-5 and to a lesser extent C5-6. Marked disc space narrowing at C5-6 with anterior osteophytes and uncovertebral spurring. Facet arthropathy on the LEFT at this level and the level below. Upper chest: Negative. Other: None IMPRESSION: No acute intracranial abnormality. Signs of marked atrophy and chronic microvascular ischemic change with worsening of atrophy since previous imaging. No evidence for  acute fracture or static subluxation of the cervical spine. Marked multilevel degenerative changes and facet arthropathy  of the cervical spine. Electronically Signed   By: Zetta Bills M.D.   On: 09/29/2020 16:20   CT CHEST WO CONTRAST  Result Date: 09/30/2020 CLINICAL DATA:  Fall EXAM: CT CHEST WITHOUT CONTRAST TECHNIQUE: Multidetector CT imaging of the chest was performed following the standard protocol without IV contrast. COMPARISON:  Prior CT scan of the chest 04/30/2015 FINDINGS: Cardiovascular: Limited evaluation in the absence of intravenous contrast. No evidence of aortic aneurysm. Scattered atherosclerotic plaque throughout the abdominal aorta. Mild cardiomegaly. Extensive coronary artery atherosclerotic calcifications. Surgical changes of prior aortic valve replacement. No pericardial effusion. Mediastinum/Nodes: Unremarkable CT appearance of the thyroid gland. No suspicious mediastinal or hilar adenopathy. No soft tissue mediastinal mass. The thoracic esophagus is unremarkable. Lungs/Pleura: Small left and moderate right layering pleural effusions. Diffuse chronic bronchial wall thickening. Atelectasis present in both lower lobes. Mild interlobular septal thickening in the apices and bases. No suspicious pulmonary mass or nodule. Upper Abdomen: No acute abnormality. Musculoskeletal: No chest wall mass or suspicious bone lesions identified. Mild dextroconvex scoliosis. IMPRESSION: 1. No evidence of acute injury to the thorax. 2. Cardiomegaly with extensive coronary artery calcifications and surgical changes of prior aortic valve replacement. 3. Moderate right and small left layering pleural effusions. Combined with trace interstitial pulmonary edema, findings suggest mild or chronic CHF. 4. Diffuse chronic bronchial wall thickening bilaterally. Aortic Atherosclerosis (ICD10-I70.0). Electronically Signed   By: Jacqulynn Cadet M.D.   On: 09/30/2020 14:31   CT CERVICAL SPINE WO CONTRAST  Result  Date: 09/30/2020 CLINICAL DATA:  Post fall.  Neck trauma. EXAM: CT CERVICAL SPINE WITHOUT CONTRAST TECHNIQUE: Multidetector CT imaging of the cervical spine was performed without intravenous contrast. Multiplanar CT image reconstructions were also generated. COMPARISON:  September 29, 2020 FINDINGS: Alignment: Normal. Skull base and vertebrae: No acute fracture. No primary bone lesion or focal pathologic process. Soft tissues and spinal canal: No prevertebral fluid or swelling. No visible canal hematoma. Disc levels:  Multilevel osteoarthritic changes. Upper chest: Negative. Other: None. IMPRESSION: 1. No evidence of acute traumatic injury to the cervical spine. 2. Multilevel osteoarthritic changes of the cervical spine. Electronically Signed   By: Fidela Salisbury M.D.   On: 09/30/2020 14:26   CT Cervical Spine Wo Contrast  Result Date: 09/29/2020 CLINICAL DATA:  A 87 year old male patient found on the floor, found down. EXAM: CT HEAD WITHOUT CONTRAST CT CERVICAL SPINE WITHOUT CONTRAST TECHNIQUE: Multidetector CT imaging of the head and cervical spine was performed following the standard protocol without intravenous contrast. Multiplanar CT image reconstructions of the cervical spine were also generated. COMPARISON:  Jun 15, 2015. FINDINGS: CT HEAD FINDINGS Brain: No evidence of acute infarction, hemorrhage, hydrocephalus, extra-axial collection or mass lesion/mass effect. Signs of marked atrophy and chronic microvascular ischemic change with worsening of atrophy since previous imaging. Vascular: No hyperdense vessel or unexpected calcification. Skull: Normal. Negative for fracture or focal lesion. Sinuses/Orbits: No acute finding. Other: None CT CERVICAL SPINE FINDINGS Alignment: Straightening of normal cervical lordotic curvature in the setting of marked spinal degenerative change. Skull base and vertebrae: No acute fracture. No primary bone lesion or focal pathologic process. Soft tissues and spinal  canal: No prevertebral fluid or swelling. No visible canal hematoma. Disc levels: Multilevel degenerative changes with marked atlantoaxial degenerative changes. Marked multilevel facet arthropathy on the RIGHT at C2-3, C3-4, C4-5 and to a lesser extent C5-6. Marked disc space narrowing at C5-6 with anterior osteophytes and uncovertebral spurring. Facet arthropathy on the LEFT at this level and the level below.  Upper chest: Negative. Other: None IMPRESSION: No acute intracranial abnormality. Signs of marked atrophy and chronic microvascular ischemic change with worsening of atrophy since previous imaging. No evidence for acute fracture or static subluxation of the cervical spine. Marked multilevel degenerative changes and facet arthropathy of the cervical spine. Electronically Signed   By: Zetta Bills M.D.   On: 09/29/2020 16:20   CT ABDOMEN PELVIS W CONTRAST  Result Date: 09/30/2020 CLINICAL DATA:  Fall EXAM: CT ABDOMEN AND PELVIS WITH CONTRAST TECHNIQUE: Multidetector CT imaging of the abdomen and pelvis was performed using the standard protocol following bolus administration of intravenous contrast. CONTRAST:  56m OMNIPAQUE IOHEXOL 350 MG/ML SOLN COMPARISON:  None. FINDINGS: Lower chest: See concurrently obtained but separately dictated CT scan of the chest. Hepatobiliary: No focal liver abnormality is seen. No gallstones, gallbladder wall thickening, or biliary dilatation. Pancreas: Unremarkable. No pancreatic ductal dilatation or surrounding inflammatory changes. Spleen: Normal in size without focal abnormality. Adrenals/Urinary Tract: Unremarkable adrenal glands. Renal cortical atrophy bilaterally. Nonobstructing bilateral nephrolithiasis. No enhancing renal mass. No ureteral abnormalities. The bladder is within normal limits. Stomach/Bowel: Scattered colonic diverticula without evidence of active inflammation. Normal appendix. No focal bowel wall thickening or evidence of obstruction.  Vascular/Lymphatic: Atherosclerotic calcifications throughout the abdominal aorta without evidence of aneurysm. No suspicious lymphadenopathy. Reproductive: Prostate is unremarkable. Other: No abdominal wall hernia or abnormality. No abdominopelvic ascites. Musculoskeletal: No acute or significant osseous findings. Bilateral lower lumbar facet arthropathy. IMPRESSION: 1. No acute abnormality or evidence of injury in the abdomen or pelvis. 2. Bilateral nonobstructing nephrolithiasis. 3.  Aortic Atherosclerosis (ICD10-I70.0). 4. Lower lumbar facet arthropathy. 5. Scattered colonic diverticula without evidence of active inflammation. Electronically Signed   By: HJacqulynn CadetM.D.   On: 09/30/2020 14:25   CT L-SPINE NO CHARGE  Result Date: 09/30/2020 CLINICAL DATA:  Fall W19.XXXA (ICD-10-CM) EXAM: CT LUMBAR SPINE WITHOUT CONTRAST TECHNIQUE: Multidetector CT imaging of the lumbar spine was performed without intravenous contrast administration. Multiplanar CT image reconstructions were also generated. COMPARISON:  None. FINDINGS: Segmentation: 5 non rib-bearing lumbar vertebral bodies. Alignment: No substantial sagittal subluxation. Mild broad levocurvature. Vertebrae: Vertebral body heights are maintained. No evidence of acute fracture. Diffuse osteopenia. Paraspinal and other soft tissues: Please see concurrent CT abdomen/pelvis for intra-abdominal evaluation. Disc levels: Broad disc bulge at L4-L5 with at least mild canal stenosis. Severe lower lumbar facet arthropathy. Probable foraminal stenosis on the left at L4-L5 and L5-S1. IMPRESSION: 1. No evidence of acute fracture or traumatic malalignment. 2. Severe lower lumbar facet arthropathy with likely at least mild canal stenosis at L4-L5 and probable left foraminal stenosis at L4-L5 and L5-S1. An MRI of the lumbar spine could better characterize the canal and foramina if clinically indicated. 3. Osteopenia. Electronically Signed   By: FMargaretha SheffieldM.D.    On: 09/30/2020 13:58   DG Chest Port 1 View  Result Date: 09/30/2020 CLINICAL DATA:  Unwitnessed fall. EXAM: PORTABLE CHEST 1 VIEW COMPARISON:  09/29/2020 FINDINGS: Stable mildly enlarged cardiac silhouette, sternal wires and prosthetic aortic valve. Interval patchy opacity in both lungs, most pronounced in the lower lung zones. No visible pleural fluid. No fracture or pneumothorax seen. Thoracic spine degenerative changes. IMPRESSION: Interval patchy bilateral atelectasis or pneumonia. Pulmonary contusions are also a possibility. Electronically Signed   By: SClaudie ReveringM.D.   On: 09/30/2020 12:09   CT Angio Chest/Abd/Pel for Dissection W and/or Wo Contrast  Result Date: 09/30/2020 CLINICAL DATA:  Abdominal pain. Elevated troponin. Clinical concern for aortic dissection. EXAM:  CT ANGIOGRAPHY CHEST, ABDOMEN AND PELVIS TECHNIQUE: Non-contrast CT of the chest was initially obtained. Multidetector CT imaging through the chest, abdomen and pelvis was performed using the standard protocol during bolus administration of intravenous contrast. Multiplanar reconstructed images and MIPs were obtained and reviewed to evaluate the vascular anatomy. CONTRAST:  28m OMNIPAQUE IOHEXOL 350 MG/ML SOLN COMPARISON:  Chest CT obtained without contrast earlier today and abdomen and pelvis CT obtained with contrast earlier today. FINDINGS: The images are limited by photon starvation and streak artifacts produced by the patient's arms not being raised above his head. CTA CHEST FINDINGS Cardiovascular: Median sternotomy wires, post CABG changes and prosthetic aortic valve. Aortic and coronary artery calcifications. No aortic aneurysm or dissection. Mildly enlarged heart with left ventricular and biatrial enlargement. Mediastinum/Nodes: No enlarged mediastinal, hilar, or axillary lymph nodes. Thyroid gland, trachea, and esophagus demonstrate no significant findings. Lungs/Pleura: Stable moderate right and small left pleural  effusions, prominent pulmonary vasculature, mildly prominent interstitial markings, diffuse peribronchial thickening and mild bilateral dependent atelectasis. Musculoskeletal: Thoracic and lower cervical spine degenerative changes, including changes of DISH. Review of the MIP images confirms the above findings. CTA ABDOMEN AND PELVIS FINDINGS VASCULAR Aorta: Extensive calcified and noncalcified mural plaque formation in the abdominal aorta, most pronounced in the infrarenal region. In that area, this is causing approximately 50% luminal narrowing. No aneurysm or acute dissection seen. Celiac: Calcified and noncalcified plaque at the origin producing greater than 90% stenosis. Normally opacified distally. SMA: Calcified plaque formation at the origin and proximally, producing approximately 50% stenosis. Renals: Dense calcified plaque formation at the origins causing approximately 90% or greater stenosis bilaterally. IMA: Noncalcified plaque at the origin producing approximately 90% stenosis. Normally opacified distally. Inflow: Bilateral common iliac calcified plaque formation causing about 50% luminal stenosis on the left and 20% luminal stenosis on the right. There is also mild calcified plaque formation involving the internal iliac arteries bilaterally. The external iliac arteries are unremarkable. There is some calcified plaque involving both common femoral arteries without significant luminal stenosis. Veins: No obvious venous abnormality within the limitations of this arterial phase study. Review of the MIP images confirms the above findings. NON-VASCULAR Hepatobiliary: No focal liver abnormality is seen. No gallstones, gallbladder wall thickening, or biliary dilatation. Pancreas: Unremarkable. No pancreatic ductal dilatation or surrounding inflammatory changes. Spleen: Mildly prominent without abnormal enlargement. Adrenals/Urinary Tract: Normal appearing adrenal glands. Small bilateral renal calculi. Excreted  contrast in the urinary bladder. Unremarkable ureters. Stomach/Bowel: Multiple colonic diverticula without evidence of diverticulitis. Normal appearing appendix. Unremarkable small bowel and stomach. Lymphatic: No enlarged lymph nodes. Reproductive: Moderately enlarged prostate gland. Other: Small to moderate-sized left inguinal hernia containing fat and small right inguinal hernia containing fat. Musculoskeletal: Lumbar spine degenerative changes. Review of the MIP images confirms the above findings. IMPRESSION: 1. No aortic aneurysm or dissection. 2. Extensive arterial atheromatous changes causing approximately 50% luminal stenosis of the infrarenal abdominal aorta, greater than 90% stenosis of the proximal celiac axis, approximately 50% stenosis of the proximal SMA, approximately 90% or greater stenosis of the renal arteries bilaterally and approximately 90% stenosis of the proximal IMA. 3. Stable changes of congestive heart failure including bilateral pleural effusions, interstitial pulmonary edema, pulmonary vascular congestion and bronchitic changes. 4. Colonic diverticulosis. 5. Bilateral nonobstructing renal calculi. Electronically Signed   By: SClaudie ReveringM.D.   On: 09/30/2020 17:36   DG Hip Unilat W or Wo Pelvis 2-3 Views Left  Result Date: 09/29/2020 CLINICAL DATA:  Fall EXAM: DG HIP (WITH OR  WITHOUT PELVIS) 2-3V LEFT COMPARISON:  09/10/2020 FINDINGS: Left SI joint is non widened. No definitive fracture or malalignment. Moderate hip arthritis. IMPRESSION: No definite acute osseous abnormality. Electronically Signed   By: Donavan Foil M.D.   On: 09/29/2020 19:04    Procedures Procedures   Medications Ordered in ED Medications  atorvastatin (LIPITOR) tablet 20 mg (has no administration in time range)  acetaminophen (TYLENOL) tablet 650 mg (has no administration in time range)    Or  acetaminophen (TYLENOL) suppository 650 mg (has no administration in time range)  ceFEPIme (MAXIPIME) 2 g in  sodium chloride 0.9 % 100 mL IVPB (has no administration in time range)  vancomycin (VANCOREADY) IVPB 1500 mg/300 mL (1,500 mg Intravenous New Bag/Given 09/30/20 1821)  vancomycin (VANCOREADY) IVPB 1250 mg/250 mL (has no administration in time range)  Tdap (BOOSTRIX) injection 0.5 mL (0.5 mLs Intramuscular Given 09/30/20 1145)  iohexol (OMNIPAQUE) 350 MG/ML injection 60 mL (60 mLs Intravenous Contrast Given 09/30/20 1314)  furosemide (LASIX) injection 40 mg (40 mg Intravenous Given 09/30/20 1518)  iohexol (OMNIPAQUE) 350 MG/ML injection 60 mL (60 mLs Intravenous Contrast Given 09/30/20 1629)  aspirin tablet 325 mg (325 mg Oral Given 09/30/20 1816)    ED Course  I have reviewed the triage vital signs and the nursing notes.  Pertinent labs & imaging results that were available during my care of the patient were reviewed by me and considered in my medical decision making (see chart for details).    MDM Rules/Calculators/A&P                           87 year old male with past medical history as above to include CABG/CAD and aortic stenosis with aortic valve replacement presenting to the emergency department after reported fall and subsequent syncopal episode.  The patient is a limited historian but is able to describe an episode during which time he was trying to get out of bed.  He remembers going to the ground and landing on his elbows but not much else beyond that.  On arrival, the patient was GCS 14, ABC intact, complaining of pain in his lower back and bilateral elbows.  He had abrasions along his elbows.  Unclear last tetanus status.  This was updated.  Given the patient's lack of reliable history, will initiate syncope and trauma work-up.  EKG revealed t wave flattening and possible junctional rhythm.  Troponins x2 ordered and pending.  Trauma work-up included CT of the chest, CT L-spine, CT abdomen pelvis, CT cervical spine, CT head, x-rays of the bilateral elbows without acute traumatic  abnormalities.  Of note, the patient was found to have cardiomegaly with extensive coronary artery calcifications and surgical changes of prior aortic valve replacement.  He was found to have moderate right and small left layering pleural effusions combined with trace interstitial pulmonary edema suggesting mild or chronic CHF.  Chest x-ray did not reveal widening of the mediastinum.  Interval patchy bilateral atelectasis or pneumonia with pulmonary contusions also a possibility.  The patient has no pulse deficit.  The patient's initial troponin resulted elevated to 1908.  His BNP was also elevated to 2356.  Given his findings of bilateral lower extremity edema, pulmonary edema on imaging and elevated troponin and BNP, concern for CHF/CHF exacerbation.  On chart review, the patient's last echocardiogram in December 2021 revealed a left ventricular EF of 60 to 65% with normal ventricular function and normal diastolic parameters.  No RV dysfunction  as well.  Given the patient's back pain, recent reported syncopal episode, elevated troponin and BNP, consideration given to aortic disease.  CTA chest abdomen pelvis was ordered to evaluate for aortic dissection.  This study was pending at signout.  At signout, the patient's repeat troponin resulted elevated to 2850.  The patient was reassessed and denied any symptoms of ACS.  Concern for NSTEMI and cardiology paged.  The patient was administered full-strength aspirin.  Full recommendations from cardiology pending at time of signout so heparin was held.  The patient was also notably with an apparent chronic leukocytosis which raises some concern for malignancy.  Hospitalist medicine consulted for admission.   Final Clinical Impression(s) / ED Diagnoses Final diagnoses:  Fall  Acute on chronic congestive heart failure, unspecified heart failure type ([place])  Syncope, unspecified syncope type  Elevated troponin  Elevated brain natriuretic peptide (BNP) level   Leukocytosis, unspecified type    Rx / DC Orders ED Discharge Orders     None        Regan Lemming, MD 09/30/20 1850    Regan Lemming, MD 09/30/20 1857

## 2020-09-30 NOTE — Progress Notes (Signed)
Pharmacy Antibiotic Note  [name] is a 87 y.o. male admitted on 09/30/2020 with sepsis.  Pharmacy has been consulted for vanc dosing.  Plan: Cefepime adjusted to 2g IV q24 per current renal function Vancomycin '1500mg'$  IV x 1 then '1250mg'$  IV q48 - goal AUC 400-550     Temp (24hrs), Avg:97.6 F (36.4 C), Min:97.6 F (36.4 C), Max:97.6 F (36.4 C)  Recent Labs  Lab 09/29/20 1740 09/30/20 1141  WBC 32.8* 43.6*  CREATININE 1.63* 1.78*    Estimated Creatinine Clearance: 27.1 mL/min (A) (by C-G formula based on SCr of 1.78 mg/dL (H)).    No Known Allergies   Thank you for allowing pharmacy to be a part of this patient's care.  Kara Mead 09/30/2020 5:01 PM

## 2020-09-30 NOTE — Consult Note (Signed)
Cardiology Consultation:   Patient ID: [name] MRN: [id number]; DOB: Feb 05, 1926  Admit date: 09/30/2020 Date of Consult: 09/30/2020  PCP:  Lavone Orn, MD   [facility] Providers Cardiologist:  Larae Grooms, MD        Patient Profile:   [name] is a 87 y.o. male with a hx of AVR CABG, HTN   who is being seen 09/30/2020 for the evaluation of fall with + troponin  at the request of Dr ER MD. Seen in ER 8/22 with fall Seen in ER yday with fall where he had been on the floor all night    Mod renal insufficiency Estimated Creatinine Clearance: 27.1 mL/min (A) (by C-G formula based on SCr of 1.78 mg/dL (H)). Severe leukocytosis 35K   CXR>> "interval patchy atelectasis or pneumonia" from 09/29/20 CXR 09/29/20 mild diffuse interstitial opacity CT >> pleural effusions CTA  pending Troponin 1900>>2800  not previously measured  BNP 2356  History of Present Illness:   [name] admitted from [facility] following another fall (As above) -- says hes did not fall but sat down on the ground and was unable to stand Complaining of back pain .>> Noted to have + Tn and Edema + BNP   Yday noted on exam to have 2+ edema Denies shortness of breath,  no chest pain  Back pain from his fall  Hx of AVR (tissue) CABG 2010  Ambulates with a walker, no longer without  DATE TEST EF   12/21 Echo   60-65 % Grad mean 10 mm          Past Medical History:  Diagnosis Date   BPH (benign prostatic hyperplasia)    Coronary artery disease with hx of myocardial infarct w/o hx of CABG 2010   Dehydration    Diverticulosis    HOH (hard of hearing)    hearing aids   Hyperlipidemia    Hypertension    Impaired fasting glucose    Memory difficulty 10/19/2012   Memory loss    Mesenteric panniculitis ([place])    Retinal tear    Severe aortic stenosis 2010   S/P AVR-TISSUE VALVE    Past Surgical History:  Procedure Laterality Date   2 VESSEL CABG     AORTIC VALVE  REPLACEMENT     BARTLE     CATARACT EXTRACTION, BILATERAL     RT 2010 LT 2014   TONSILLECTOMY      No current facility-administered medications on file prior to encounter.   Current Outpatient Medications on File Prior to Encounter  Medication Sig Dispense Refill   amLODipine (NORVASC) 10 MG tablet Take 5 mg by mouth daily.     amoxicillin (AMOXIL) 500 MG tablet TAKE 4 TABLETS BY MOUTH 1 HOUR BEFORE DENTAL PROCEDURE 4 tablet 3   aspirin EC 81 MG tablet Take 81 mg by mouth daily.     atorvastatin (LIPITOR) 10 MG tablet Take 10 mg by mouth daily.     furosemide (LASIX) 40 MG tablet Take 1 tablet (40 mg total) by mouth daily. 90 tablet 3   latanoprost (XALATAN) 0.005 % ophthalmic solution 1 drop daily.     Multiple Vitamins-Minerals (MULTIVITAMIN WITH MINERALS) tablet Take 1 tablet by mouth daily.       Inpatient Medications: Scheduled Meds:  aspirin  325 mg Oral Daily   Continuous Infusions:  PRN Meds:   Allergies:   No Known Allergies  Social History:   Social History   Socioeconomic History  Marital status: Married    Spouse name: Not on file   Number of children: 3   Years of education: BA   Highest education level: Not on file  Occupational History   Occupation: Retired  Tobacco Use   Smoking status: Some Days    Types: Pipe   Smokeless tobacco: Never   Tobacco comments:    PIPE  Substance and Sexual Activity   Alcohol use: No    Comment: RARELY   Drug use: No   Sexual activity: Not on file  Other Topics Concern   Not on file  Social History Narrative   Patient lives at home with his wife [name])   Retired.   Education college B.S.   Right handed.   Caffeine - Decaf coffee   Social Determinants of Health   Financial Resource Strain: Not on file  Food Insecurity: Not on file  Transportation Needs: Not on file  Physical Activity: Not on file  Stress: Not on file  Social Connections: Not on file  Intimate Partner Violence: Not on file    Family  History:    Family History  Problem Relation Age of Onset   Stroke Father    Cancer Brother    Hypertension Brother    Heart attack Neg Hx      ROS:  Please see the history of present illness.    All other ROS reviewed and negative.     Physical Exam/Data:   Vitals:   09/30/20 1245 09/30/20 1300 09/30/20 1400 09/30/20 1500  BP: (!) 146/72  (!) 152/75 (!) 160/63  Pulse: 97 87 85 98  Resp: '14  18 18  '$ Temp:      TempSrc:      SpO2: 92% 95% 92% 90%   No intake or output data in the 24 hours ending 09/30/20 1635 Last 3 Weights 09/29/2020 01/23/2020 01/04/2020  Weight (lbs) 156 lb 8.4 oz 155 lb 158 lb  Weight (kg) 71 kg 70.308 kg 71.668 kg     There is no height or weight on file to calculate BMI.  General:  Well nourished, well developed, in no acute distress  HEENT: normal Lymph: no adenopathy Neck: no JVD Endocrine:  No thryomegaly Vascular: No carotid bruits; FA pulses 2+ bilaterally without bruits  Cardiac:  normal S1, S2; RRR; no murmur   Lungs:  clear to auscultation bilaterally, no wheezing, rhonchi or rales  Abd: soft, nontender, no hepatomegaly  Ext: 1+ edema Musculoskeletal:  No deformities, BUE and BLE strength normal and equal Skin: warm and dry  Neuro:  CNs 2-12 intact, no focal abnormalities noted Psych:  Normal affect   EKG:  The EKG was personally reviewed and demonstrates:  sinus inferior MI old, PRWP Q waves new from yesterday? Lead placement error ( avR also upright now compared to yesterday)  Lateral ST changes 09/20/20 now resolved  Telemetry:  Telemetry was personally reviewed and demonstrates:  pending  Relevant CV Studies:    Laboratory Data:  High Sensitivity Troponin:   Recent Labs  Lab 09/30/20 1141 09/30/20 1411  TROPONINIHS 1,908* 2,850*     Chemistry Recent Labs  Lab 09/29/20 1740 09/30/20 1141  NA 139 143  K 4.6 4.5  CL 108 110  CO2 21* 23  GLUCOSE 106* 125*  BUN 34* 38*  CREATININE 1.63* 1.78*  CALCIUM 9.0 9.4   GFRNONAA 40* 36*  ANIONGAP 10 10    Recent Labs  Lab 09/30/20 1141  PROT 7.9  ALBUMIN 4.0  AST 56*  ALT 20  ALKPHOS 89  BILITOT 1.5*   Hematology Recent Labs  Lab 09/29/20 1740 09/30/20 1141  WBC 32.8* 43.6*  RBC 4.19* 4.30  HGB 12.2* 12.8*  HCT 38.5* 38.6*  MCV 91.9 89.8  MCH 29.1 29.8  MCHC 31.7 33.2  RDW 18.6* 18.8*  PLT 745* 756*   BNP Recent Labs  Lab 09/30/20 1141  BNP 2,356.4*    DDimer No results for input(s): DDIMER in the last 168 hours.   Radiology/Studies:  DG Chest 2 View  Result Date: 09/29/2020 CLINICAL DATA:  Fall EXAM: CHEST - 2 VIEW COMPARISON:  09/10/2020, 04/08/2015 FINDINGS: Post sternotomy changes. Cardiomegaly with aortic atherosclerosis. Chronic pleural and parenchymal scarring at the left base. Mild interstitial prominence could be due to edema. Aortic valve prosthesis. No visible pneumothorax. IMPRESSION: 1. Cardiomegaly and mild diffuse interstitial opacity which could be due to mild edema 2. Chronic pleural and parenchymal scarring at left lung base. Electronically Signed   By: Donavan Foil M.D.   On: 09/29/2020 17:20   DG Lumbar Spine Complete  Result Date: 09/29/2020 CLINICAL DATA:  [place], back pain EXAM: LUMBAR SPINE - COMPLETE 4+ VIEW COMPARISON:  None. FINDINGS: Frontal, bilateral oblique, lateral views of the lumbar spine are obtained. There are 5 non-rib-bearing lumbar type vertebral bodies in normal anatomic alignment. No acute displaced fractures. There is prominent spondylosis at the thoracolumbar junction with bridging anterior osteophytes. Significant facet hypertrophy is seen within the lower lumbar spine greatest at L3-4, L4-5, and L5-S1. Sacroiliac joints are unremarkable. Visualized portions of the bony pelvis are normal. IMPRESSION: 1. Multilevel thoracolumbar spondylosis and facet hypertrophy. No acute bony abnormality. Electronically Signed   By: Randa Ngo M.D.   On: 09/29/2020 17:21   DG Pelvis 1-2 Views  Result  Date: 09/29/2020 CLINICAL DATA:  Fall EXAM: PELVIS - 1-2 VIEW COMPARISON:  09/10/2020 FINDINGS: SI joints show mild degenerative change but are non widened. Pubic symphysis and rami appear intact. No definitive fracture or malalignment. An oblique lucency over the left femoral neck appears to represent a skin fold artifact. Moderate bilateral hip arthritis IMPRESSION: 1. No definite acute osseous abnormality. A lucency over the left femoral neck is suspected to be due to fold artifact 2. Cross-sectional imaging should be pursued if high clinical suspicion for hip fracture Electronically Signed   By: Donavan Foil M.D.   On: 09/29/2020 17:37   DG Elbow Complete Left  Result Date: 09/30/2020 CLINICAL DATA:  Fall in a 87 year old male with bilateral skin tears to the elbows. EXAM: LEFT ELBOW - COMPLETE 3+ VIEW COMPARISON:  Contralateral elbow FINDINGS: Small well corticated bone fragments adjacent to the lateral epicondyle. No acute fracture or dislocation. No substantial soft tissue swelling or sign of joint effusion. IMPRESSION: No acute fracture or traumatic malalignment. Small well corticated bone fragments adjacent to the lateral epicondyle may reflect remote injury or sequela of prior epicondylitis. Electronically Signed   By: Zetta Bills M.D.   On: 09/30/2020 12:08   DG Elbow Complete Right  Result Date: 09/30/2020 CLINICAL DATA:  Right elbow pain and skin tears following a fall. EXAM: RIGHT ELBOW - COMPLETE 3+ VIEW COMPARISON:  09/29/2020 FINDINGS: Decreased posterior elbow swelling. No fracture, dislocation or effusion. IMPRESSION: No fracture or dislocation. Electronically Signed   By: Claudie Revering M.D.   On: 09/30/2020 12:12   DG Elbow Complete Right  Result Date: 09/29/2020 CLINICAL DATA:  Fall with laceration EXAM: RIGHT ELBOW - COMPLETE 3+ VIEW COMPARISON:  None. FINDINGS: There is no evidence of fracture, or dislocation. Slightly limited lateral view due to nonstandard positioning.  Posterior elbow swelling. No gross effusion IMPRESSION: No definite acute osseous abnormality Electronically Signed   By: Donavan Foil M.D.   On: 09/29/2020 17:21   CT HEAD WO CONTRAST  Result Date: 09/30/2020 CLINICAL DATA:  Head trauma. EXAM: CT HEAD WITHOUT CONTRAST TECHNIQUE: Contiguous axial images were obtained from the base of the skull through the vertex without intravenous contrast. COMPARISON:  September 29, 2020 FINDINGS: Brain: No evidence of acute infarction, hemorrhage, hydrocephalus, extra-axial collection or mass lesion/mass effect. Moderate brain parenchymal volume loss and deep white matter microangiopathy. Vascular: No hyperdense vessel or unexpected calcification. Skull: Normal. Negative for fracture or focal lesion. Sinuses/Orbits: No acute finding. Other: None. IMPRESSION: 1. No acute intracranial abnormality. 2. Moderate brain parenchymal atrophy and chronic microvascular disease. Electronically Signed   By: Fidela Salisbury M.D.   On: 09/30/2020 14:21   CT HEAD WO CONTRAST (5MM)  Result Date: 09/29/2020 CLINICAL DATA:  A 87 year old male patient found on the floor, found down. EXAM: CT HEAD WITHOUT CONTRAST CT CERVICAL SPINE WITHOUT CONTRAST TECHNIQUE: Multidetector CT imaging of the head and cervical spine was performed following the standard protocol without intravenous contrast. Multiplanar CT image reconstructions of the cervical spine were also generated. COMPARISON:  Jun 15, 2015. FINDINGS: CT HEAD FINDINGS Brain: No evidence of acute infarction, hemorrhage, hydrocephalus, extra-axial collection or mass lesion/mass effect. Signs of marked atrophy and chronic microvascular ischemic change with worsening of atrophy since previous imaging. Vascular: No hyperdense vessel or unexpected calcification. Skull: Normal. Negative for fracture or focal lesion. Sinuses/Orbits: No acute finding. Other: None CT CERVICAL SPINE FINDINGS Alignment: Straightening of normal cervical lordotic  curvature in the setting of marked spinal degenerative change. Skull base and vertebrae: No acute fracture. No primary bone lesion or focal pathologic process. Soft tissues and spinal canal: No prevertebral fluid or swelling. No visible canal hematoma. Disc levels: Multilevel degenerative changes with marked atlantoaxial degenerative changes. Marked multilevel facet arthropathy on the RIGHT at C2-3, C3-4, C4-5 and to a lesser extent C5-6. Marked disc space narrowing at C5-6 with anterior osteophytes and uncovertebral spurring. Facet arthropathy on the LEFT at this level and the level below. Upper chest: Negative. Other: None IMPRESSION: No acute intracranial abnormality. Signs of marked atrophy and chronic microvascular ischemic change with worsening of atrophy since previous imaging. No evidence for acute fracture or static subluxation of the cervical spine. Marked multilevel degenerative changes and facet arthropathy of the cervical spine. Electronically Signed   By: Zetta Bills M.D.   On: 09/29/2020 16:20   CT CHEST WO CONTRAST  Result Date: 09/30/2020 CLINICAL DATA:  Fall EXAM: CT CHEST WITHOUT CONTRAST TECHNIQUE: Multidetector CT imaging of the chest was performed following the standard protocol without IV contrast. COMPARISON:  Prior CT scan of the chest 04/30/2015 FINDINGS: Cardiovascular: Limited evaluation in the absence of intravenous contrast. No evidence of aortic aneurysm. Scattered atherosclerotic plaque throughout the abdominal aorta. Mild cardiomegaly. Extensive coronary artery atherosclerotic calcifications. Surgical changes of prior aortic valve replacement. No pericardial effusion. Mediastinum/Nodes: Unremarkable CT appearance of the thyroid gland. No suspicious mediastinal or hilar adenopathy. No soft tissue mediastinal mass. The thoracic esophagus is unremarkable. Lungs/Pleura: Small left and moderate right layering pleural effusions. Diffuse chronic bronchial wall thickening. Atelectasis  present in both lower lobes. Mild interlobular septal thickening in the apices and bases. No suspicious pulmonary mass or nodule. Upper Abdomen: No acute abnormality. Musculoskeletal: No  chest wall mass or suspicious bone lesions identified. Mild dextroconvex scoliosis. IMPRESSION: 1. No evidence of acute injury to the thorax. 2. Cardiomegaly with extensive coronary artery calcifications and surgical changes of prior aortic valve replacement. 3. Moderate right and small left layering pleural effusions. Combined with trace interstitial pulmonary edema, findings suggest mild or chronic CHF. 4. Diffuse chronic bronchial wall thickening bilaterally. Aortic Atherosclerosis (ICD10-I70.0). Electronically Signed   By: Jacqulynn Cadet M.D.   On: 09/30/2020 14:31   CT CERVICAL SPINE WO CONTRAST  Result Date: 09/30/2020 CLINICAL DATA:  Post fall.  Neck trauma. EXAM: CT CERVICAL SPINE WITHOUT CONTRAST TECHNIQUE: Multidetector CT imaging of the cervical spine was performed without intravenous contrast. Multiplanar CT image reconstructions were also generated. COMPARISON:  September 29, 2020 FINDINGS: Alignment: Normal. Skull base and vertebrae: No acute fracture. No primary bone lesion or focal pathologic process. Soft tissues and spinal canal: No prevertebral fluid or swelling. No visible canal hematoma. Disc levels:  Multilevel osteoarthritic changes. Upper chest: Negative. Other: None. IMPRESSION: 1. No evidence of acute traumatic injury to the cervical spine. 2. Multilevel osteoarthritic changes of the cervical spine. Electronically Signed   By: Fidela Salisbury M.D.   On: 09/30/2020 14:26   CT Cervical Spine Wo Contrast  Result Date: 09/29/2020 CLINICAL DATA:  A 87 year old male patient found on the floor, found down. EXAM: CT HEAD WITHOUT CONTRAST CT CERVICAL SPINE WITHOUT CONTRAST TECHNIQUE: Multidetector CT imaging of the head and cervical spine was performed following the standard protocol without  intravenous contrast. Multiplanar CT image reconstructions of the cervical spine were also generated. COMPARISON:  Jun 15, 2015. FINDINGS: CT HEAD FINDINGS Brain: No evidence of acute infarction, hemorrhage, hydrocephalus, extra-axial collection or mass lesion/mass effect. Signs of marked atrophy and chronic microvascular ischemic change with worsening of atrophy since previous imaging. Vascular: No hyperdense vessel or unexpected calcification. Skull: Normal. Negative for fracture or focal lesion. Sinuses/Orbits: No acute finding. Other: None CT CERVICAL SPINE FINDINGS Alignment: Straightening of normal cervical lordotic curvature in the setting of marked spinal degenerative change. Skull base and vertebrae: No acute fracture. No primary bone lesion or focal pathologic process. Soft tissues and spinal canal: No prevertebral fluid or swelling. No visible canal hematoma. Disc levels: Multilevel degenerative changes with marked atlantoaxial degenerative changes. Marked multilevel facet arthropathy on the RIGHT at C2-3, C3-4, C4-5 and to a lesser extent C5-6. Marked disc space narrowing at C5-6 with anterior osteophytes and uncovertebral spurring. Facet arthropathy on the LEFT at this level and the level below. Upper chest: Negative. Other: None IMPRESSION: No acute intracranial abnormality. Signs of marked atrophy and chronic microvascular ischemic change with worsening of atrophy since previous imaging. No evidence for acute fracture or static subluxation of the cervical spine. Marked multilevel degenerative changes and facet arthropathy of the cervical spine. Electronically Signed   By: Zetta Bills M.D.   On: 09/29/2020 16:20   CT ABDOMEN PELVIS W CONTRAST  Result Date: 09/30/2020 CLINICAL DATA:  Fall EXAM: CT ABDOMEN AND PELVIS WITH CONTRAST TECHNIQUE: Multidetector CT imaging of the abdomen and pelvis was performed using the standard protocol following bolus administration of intravenous contrast. CONTRAST:   39m OMNIPAQUE IOHEXOL 350 MG/ML SOLN COMPARISON:  None. FINDINGS: Lower chest: See concurrently obtained but separately dictated CT scan of the chest. Hepatobiliary: No focal liver abnormality is seen. No gallstones, gallbladder wall thickening, or biliary dilatation. Pancreas: Unremarkable. No pancreatic ductal dilatation or surrounding inflammatory changes. Spleen: Normal in size without focal abnormality. Adrenals/Urinary Tract: Unremarkable adrenal  glands. Renal cortical atrophy bilaterally. Nonobstructing bilateral nephrolithiasis. No enhancing renal mass. No ureteral abnormalities. The bladder is within normal limits. Stomach/Bowel: Scattered colonic diverticula without evidence of active inflammation. Normal appendix. No focal bowel wall thickening or evidence of obstruction. Vascular/Lymphatic: Atherosclerotic calcifications throughout the abdominal aorta without evidence of aneurysm. No suspicious lymphadenopathy. Reproductive: Prostate is unremarkable. Other: No abdominal wall hernia or abnormality. No abdominopelvic ascites. Musculoskeletal: No acute or significant osseous findings. Bilateral lower lumbar facet arthropathy. IMPRESSION: 1. No acute abnormality or evidence of injury in the abdomen or pelvis. 2. Bilateral nonobstructing nephrolithiasis. 3.  Aortic Atherosclerosis (ICD10-I70.0). 4. Lower lumbar facet arthropathy. 5. Scattered colonic diverticula without evidence of active inflammation. Electronically Signed   By: Jacqulynn Cadet M.D.   On: 09/30/2020 14:25   CT L-SPINE NO CHARGE  Result Date: 09/30/2020 CLINICAL DATA:  Fall W19.XXXA (ICD-10-CM) EXAM: CT LUMBAR SPINE WITHOUT CONTRAST TECHNIQUE: Multidetector CT imaging of the lumbar spine was performed without intravenous contrast administration. Multiplanar CT image reconstructions were also generated. COMPARISON:  None. FINDINGS: Segmentation: 5 non rib-bearing lumbar vertebral bodies. Alignment: No substantial sagittal subluxation.  Mild broad levocurvature. Vertebrae: Vertebral body heights are maintained. No evidence of acute fracture. Diffuse osteopenia. Paraspinal and other soft tissues: Please see concurrent CT abdomen/pelvis for intra-abdominal evaluation. Disc levels: Broad disc bulge at L4-L5 with at least mild canal stenosis. Severe lower lumbar facet arthropathy. Probable foraminal stenosis on the left at L4-L5 and L5-S1. IMPRESSION: 1. No evidence of acute fracture or traumatic malalignment. 2. Severe lower lumbar facet arthropathy with likely at least mild canal stenosis at L4-L5 and probable left foraminal stenosis at L4-L5 and L5-S1. An MRI of the lumbar spine could better characterize the canal and foramina if clinically indicated. 3. Osteopenia. Electronically Signed   By: Margaretha Sheffield M.D.   On: 09/30/2020 13:58   DG Chest Port 1 View  Result Date: 09/30/2020 CLINICAL DATA:  Unwitnessed fall. EXAM: PORTABLE CHEST 1 VIEW COMPARISON:  09/29/2020 FINDINGS: Stable mildly enlarged cardiac silhouette, sternal wires and prosthetic aortic valve. Interval patchy opacity in both lungs, most pronounced in the lower lung zones. No visible pleural fluid. No fracture or pneumothorax seen. Thoracic spine degenerative changes. IMPRESSION: Interval patchy bilateral atelectasis or pneumonia. Pulmonary contusions are also a possibility. Electronically Signed   By: Claudie Revering M.D.   On: 09/30/2020 12:09   DG Hip Unilat W or Wo Pelvis 2-3 Views Left  Result Date: 09/29/2020 CLINICAL DATA:  Fall EXAM: DG HIP (WITH OR WITHOUT PELVIS) 2-3V LEFT COMPARISON:  09/10/2020 FINDINGS: Left SI joint is non widened. No definitive fracture or malalignment. Moderate hip arthritis. IMPRESSION: No definite acute osseous abnormality. Electronically Signed   By: Donavan Foil M.D.   On: 09/29/2020 19:04     Assessment and Plan:   Troponin elevation ?NSTEMI CHF chronic diastolic Renal insufficiency grade 4 Leukocytosis with elevated neutrophil  with elevated platelet count ? MDS Recurrent falls CABG/AVR    Would not diurese yet; this point, minimal edema and BUN/Cr quite elevated and no SOB  Moreover, his renal function makes interpretation of his biomarkers more challenging.     Not sure how to interpret his troponin  No chest pain. Elevated Cr and  chronic leukemia, which he may have of some kind based on persisting leukocytosis, can also be assoc with elevated biomarker I am not impressed that this represents an acute coronary syndrome. The Tn being "found"  W/ GFR would not favor an invasive approach to his Tn,  and thus would not undertake myoview scanning    Continue aspirin but would not add heparin at this juncture  Would measure orthostatics and consider PT OT eval for recurrent falls  Also heme consult might be valuable to give a name and a prognosis for his persistent leukoytosis ?     For questions or updates, please contact [place] Please consult [email or web address] for contact info under    Signed, Virl Axe, MD  09/30/2020 4:35 PM

## 2020-09-30 NOTE — ED Triage Notes (Signed)
BIB EMS from [place] at [facility], unwitnessed fall, patient attempted to ambulate w/o assistance, complains of lower back pain. Hx of dementia, pt was altered so C-collar in place. EMS states his urine smells strong.   CBG 176

## 2020-09-30 NOTE — ED Notes (Signed)
Patient transported to CT 

## 2020-09-30 NOTE — H&P (Signed)
History and Physical    [name] [id number] DOB: [birth year] DOA: 09/30/2020  PCP: Lavone Orn, MD Patient coming from: [facility]  Chief Complaint: Recurrent fall and back pain  HPI: [name] is a 87 y.o. male with history of dementia, CAD s/p CABG in 2010, AS s/p AVR, HTN and BPH returning to ED after recurrent fall.  Patient was seen in ED yesterday after found down on the floor at facility.  Heart work-up in ED including CXR, DG hip, BG pelvis, DG lumbar spine, CT head and CT cervical spine which did not show acute finding. His CK was within normal.  Creatinine 1.63 with BUN of 34.  WBC 32.8 which seems to be stable compared to his WBC about 2 weeks prior, and he was discharged from ED.  ED note is pending.  Per EDP, patient returns to ED today after another fall incident with LOC although patient denied this.  Patient has dementia but awake and oriented to self, place and situation but not time.  He says he had a fall 2 days prior but not since then.  He complains back pain.  Not able to elaborate his pain or right hip pain.  Denies numbness or tingling in his legs.  He denies fever, chills, chest pain, dyspnea, nausea, vomiting, abdominal pain or UTI symptoms.  No family member to supplement history.  Attempted to call patient's wife, only person listed in his chart but no answer.  In ED, slightly elevated BP.  Otherwise, vitals within normal.  WBC 44 (in mid 30s for the last 2 weeks).  Hgb 12.8.  Platelets 756. Cr 1.78.  BUN 38.  Troponin 1900 >> 2800.  BNP 2356.  First EKG looks like junctional rhythm with flat T waves.  Repeat EKG looks the same but with a lot of artifacts.  Feet but UA with large Hgb.  COVID-19 and influenza PCR nonreactive.  CT head and cervical spine without acute finding.  CXR with atelectasis/possible pneumonia/possible contusion.  BG left and right elbow without acute osseous finding.  CTA chest with cardiomegaly, moderate right and small left  pleural effusion.  CT abdomen and pelvis with bilateral nonobstructive nephrolithiasis but no other acute significant finding.  Received IV Lasix and Tdap.  Hospitalist service called for admission for possible CHF.  Requested EDP to consult cardiology.  CT angio chest/abdomen/pelvis for dissection pending.  ROS All review of system negative except for pertinent positives and negatives as history of present illness above.  PMH Past Medical History:  Diagnosis Date   BPH (benign prostatic hyperplasia)    Coronary artery disease with hx of myocardial infarct w/o hx of CABG 2010   Dehydration    Diverticulosis    HOH (hard of hearing)    hearing aids   Hyperlipidemia    Hypertension    Impaired fasting glucose    Memory difficulty 10/19/2012   Memory loss    Mesenteric panniculitis ([place])    Retinal tear    Severe aortic stenosis 2010   S/P AVR-TISSUE VALVE   PSH Past Surgical History:  Procedure Laterality Date   2 VESSEL CABG     AORTIC VALVE REPLACEMENT     BARTLE     CATARACT EXTRACTION, BILATERAL     RT 2010 LT 2014   TONSILLECTOMY     Fam HX Family History  Problem Relation Age of Onset   Stroke Father    Cancer Brother    Hypertension Brother  Heart attack Neg Hx     Social Hx  reports that he has been smoking pipe. He has never used smokeless tobacco. He reports that he does not drink alcohol and does not use drugs.  Allergy No Known Allergies Home Meds Prior to Admission medications   Medication Sig Start Date End Date Taking? Authorizing Provider  amLODipine (NORVASC) 10 MG tablet Take 5 mg by mouth daily.    [provider]  amoxicillin (AMOXIL) 500 MG tablet TAKE 4 TABLETS BY MOUTH 1 HOUR BEFORE DENTAL PROCEDURE 05/26/17   Jettie Booze, MD  aspirin EC 81 MG tablet Take 81 mg by mouth daily.    [provider]  atorvastatin (LIPITOR) 10 MG tablet Take 10 mg by mouth daily.    [provider]  furosemide (LASIX) 40 MG  tablet Take 1 tablet (40 mg total) by mouth daily. 01/04/20 04/03/20  Kathyrn Drown D, NP  latanoprost (XALATAN) 0.005 % ophthalmic solution 1 drop daily. 07/12/19   [provider]  Multiple Vitamins-Minerals (MULTIVITAMIN WITH MINERALS) tablet Take 1 tablet by mouth daily.    [provider]    Physical Exam: Vitals:   09/30/20 1245 09/30/20 1300 09/30/20 1400 09/30/20 1500  BP: (!) 146/72  (!) 152/75 (!) 160/63  Pulse: 97 87 85 98  Resp: '14  18 18  '$ Temp:      TempSrc:      SpO2: 92% 95% 92% 90%    GENERAL: Frail looking elderly male.  No apparent distress. HEENT: MMM.  Vision and hearing grossly intact.  NECK: Supple.  No apparent JVD.  RESP: On RA.  No IWOB. Good air movement bilaterally. CVS:  RRR. Heart sounds normal.  ABD/GI/GU: Bowel sounds present. Soft. Non tender.  No tenderness over his back MSK/EXT:  Moves extremities. No apparent deformity.  1+ pitting edema bilaterally. SKIN: no apparent skin lesion or wound.  Clean looking skin ulcers over bilateral elbows. NEURO: Awake and alert.  Oriented to self, place and situation to some extent but not time.  Follows commands.  No gross deficit.  PSYCH: Somewhat confused and restless in bed.  Personally Reviewed Radiological Exams DG Chest 2 View  Result Date: 09/29/2020 CLINICAL DATA:  Fall EXAM: CHEST - 2 VIEW COMPARISON:  09/10/2020, 04/08/2015 FINDINGS: Post sternotomy changes. Cardiomegaly with aortic atherosclerosis. Chronic pleural and parenchymal scarring at the left base. Mild interstitial prominence could be due to edema. Aortic valve prosthesis. No visible pneumothorax. IMPRESSION: 1. Cardiomegaly and mild diffuse interstitial opacity which could be due to mild edema 2. Chronic pleural and parenchymal scarring at left lung base. Electronically Signed   By: Donavan Foil M.D.   On: 09/29/2020 17:20   DG Lumbar Spine Complete  Result Date: 09/29/2020 CLINICAL DATA:  [place], back pain EXAM: LUMBAR SPINE -  COMPLETE 4+ VIEW COMPARISON:  None. FINDINGS: Frontal, bilateral oblique, lateral views of the lumbar spine are obtained. There are 5 non-rib-bearing lumbar type vertebral bodies in normal anatomic alignment. No acute displaced fractures. There is prominent spondylosis at the thoracolumbar junction with bridging anterior osteophytes. Significant facet hypertrophy is seen within the lower lumbar spine greatest at L3-4, L4-5, and L5-S1. Sacroiliac joints are unremarkable. Visualized portions of the bony pelvis are normal. IMPRESSION: 1. Multilevel thoracolumbar spondylosis and facet hypertrophy. No acute bony abnormality. Electronically Signed   By: Randa Ngo M.D.   On: 09/29/2020 17:21   DG Pelvis 1-2 Views  Result Date: 09/29/2020 CLINICAL DATA:  Fall EXAM: PELVIS -  1-2 VIEW COMPARISON:  09/10/2020 FINDINGS: SI joints show mild degenerative change but are non widened. Pubic symphysis and rami appear intact. No definitive fracture or malalignment. An oblique lucency over the left femoral neck appears to represent a skin fold artifact. Moderate bilateral hip arthritis IMPRESSION: 1. No definite acute osseous abnormality. A lucency over the left femoral neck is suspected to be due to fold artifact 2. Cross-sectional imaging should be pursued if high clinical suspicion for hip fracture Electronically Signed   By: Donavan Foil M.D.   On: 09/29/2020 17:37   DG Elbow Complete Left  Result Date: 09/30/2020 CLINICAL DATA:  Fall in a 87 year old male with bilateral skin tears to the elbows. EXAM: LEFT ELBOW - COMPLETE 3+ VIEW COMPARISON:  Contralateral elbow FINDINGS: Small well corticated bone fragments adjacent to the lateral epicondyle. No acute fracture or dislocation. No substantial soft tissue swelling or sign of joint effusion. IMPRESSION: No acute fracture or traumatic malalignment. Small well corticated bone fragments adjacent to the lateral epicondyle may reflect remote injury or sequela of prior  epicondylitis. Electronically Signed   By: Zetta Bills M.D.   On: 09/30/2020 12:08   DG Elbow Complete Right  Result Date: 09/30/2020 CLINICAL DATA:  Right elbow pain and skin tears following a fall. EXAM: RIGHT ELBOW - COMPLETE 3+ VIEW COMPARISON:  09/29/2020 FINDINGS: Decreased posterior elbow swelling. No fracture, dislocation or effusion. IMPRESSION: No fracture or dislocation. Electronically Signed   By: Claudie Revering M.D.   On: 09/30/2020 12:12   DG Elbow Complete Right  Result Date: 09/29/2020 CLINICAL DATA:  Fall with laceration EXAM: RIGHT ELBOW - COMPLETE 3+ VIEW COMPARISON:  None. FINDINGS: There is no evidence of fracture, or dislocation. Slightly limited lateral view due to nonstandard positioning. Posterior elbow swelling. No gross effusion IMPRESSION: No definite acute osseous abnormality Electronically Signed   By: Donavan Foil M.D.   On: 09/29/2020 17:21   CT HEAD WO CONTRAST  Result Date: 09/30/2020 CLINICAL DATA:  Head trauma. EXAM: CT HEAD WITHOUT CONTRAST TECHNIQUE: Contiguous axial images were obtained from the base of the skull through the vertex without intravenous contrast. COMPARISON:  September 29, 2020 FINDINGS: Brain: No evidence of acute infarction, hemorrhage, hydrocephalus, extra-axial collection or mass lesion/mass effect. Moderate brain parenchymal volume loss and deep white matter microangiopathy. Vascular: No hyperdense vessel or unexpected calcification. Skull: Normal. Negative for fracture or focal lesion. Sinuses/Orbits: No acute finding. Other: None. IMPRESSION: 1. No acute intracranial abnormality. 2. Moderate brain parenchymal atrophy and chronic microvascular disease. Electronically Signed   By: Fidela Salisbury M.D.   On: 09/30/2020 14:21   CT HEAD WO CONTRAST (5MM)  Result Date: 09/29/2020 CLINICAL DATA:  A 87 year old male patient found on the floor, found down. EXAM: CT HEAD WITHOUT CONTRAST CT CERVICAL SPINE WITHOUT CONTRAST TECHNIQUE:  Multidetector CT imaging of the head and cervical spine was performed following the standard protocol without intravenous contrast. Multiplanar CT image reconstructions of the cervical spine were also generated. COMPARISON:  Jun 15, 2015. FINDINGS: CT HEAD FINDINGS Brain: No evidence of acute infarction, hemorrhage, hydrocephalus, extra-axial collection or mass lesion/mass effect. Signs of marked atrophy and chronic microvascular ischemic change with worsening of atrophy since previous imaging. Vascular: No hyperdense vessel or unexpected calcification. Skull: Normal. Negative for fracture or focal lesion. Sinuses/Orbits: No acute finding. Other: None CT CERVICAL SPINE FINDINGS Alignment: Straightening of normal cervical lordotic curvature in the setting of marked spinal degenerative change. Skull base and vertebrae: No acute fracture. No primary bone  lesion or focal pathologic process. Soft tissues and spinal canal: No prevertebral fluid or swelling. No visible canal hematoma. Disc levels: Multilevel degenerative changes with marked atlantoaxial degenerative changes. Marked multilevel facet arthropathy on the RIGHT at C2-3, C3-4, C4-5 and to a lesser extent C5-6. Marked disc space narrowing at C5-6 with anterior osteophytes and uncovertebral spurring. Facet arthropathy on the LEFT at this level and the level below. Upper chest: Negative. Other: None IMPRESSION: No acute intracranial abnormality. Signs of marked atrophy and chronic microvascular ischemic change with worsening of atrophy since previous imaging. No evidence for acute fracture or static subluxation of the cervical spine. Marked multilevel degenerative changes and facet arthropathy of the cervical spine. Electronically Signed   By: Zetta Bills M.D.   On: 09/29/2020 16:20   CT CHEST WO CONTRAST  Result Date: 09/30/2020 CLINICAL DATA:  Fall EXAM: CT CHEST WITHOUT CONTRAST TECHNIQUE: Multidetector CT imaging of the chest was performed following the  standard protocol without IV contrast. COMPARISON:  Prior CT scan of the chest 04/30/2015 FINDINGS: Cardiovascular: Limited evaluation in the absence of intravenous contrast. No evidence of aortic aneurysm. Scattered atherosclerotic plaque throughout the abdominal aorta. Mild cardiomegaly. Extensive coronary artery atherosclerotic calcifications. Surgical changes of prior aortic valve replacement. No pericardial effusion. Mediastinum/Nodes: Unremarkable CT appearance of the thyroid gland. No suspicious mediastinal or hilar adenopathy. No soft tissue mediastinal mass. The thoracic esophagus is unremarkable. Lungs/Pleura: Small left and moderate right layering pleural effusions. Diffuse chronic bronchial wall thickening. Atelectasis present in both lower lobes. Mild interlobular septal thickening in the apices and bases. No suspicious pulmonary mass or nodule. Upper Abdomen: No acute abnormality. Musculoskeletal: No chest wall mass or suspicious bone lesions identified. Mild dextroconvex scoliosis. IMPRESSION: 1. No evidence of acute injury to the thorax. 2. Cardiomegaly with extensive coronary artery calcifications and surgical changes of prior aortic valve replacement. 3. Moderate right and small left layering pleural effusions. Combined with trace interstitial pulmonary edema, findings suggest mild or chronic CHF. 4. Diffuse chronic bronchial wall thickening bilaterally. Aortic Atherosclerosis (ICD10-I70.0). Electronically Signed   By: Jacqulynn Cadet M.D.   On: 09/30/2020 14:31   CT CERVICAL SPINE WO CONTRAST  Result Date: 09/30/2020 CLINICAL DATA:  Post fall.  Neck trauma. EXAM: CT CERVICAL SPINE WITHOUT CONTRAST TECHNIQUE: Multidetector CT imaging of the cervical spine was performed without intravenous contrast. Multiplanar CT image reconstructions were also generated. COMPARISON:  September 29, 2020 FINDINGS: Alignment: Normal. Skull base and vertebrae: No acute fracture. No primary bone lesion or focal  pathologic process. Soft tissues and spinal canal: No prevertebral fluid or swelling. No visible canal hematoma. Disc levels:  Multilevel osteoarthritic changes. Upper chest: Negative. Other: None. IMPRESSION: 1. No evidence of acute traumatic injury to the cervical spine. 2. Multilevel osteoarthritic changes of the cervical spine. Electronically Signed   By: Fidela Salisbury M.D.   On: 09/30/2020 14:26   CT Cervical Spine Wo Contrast  Result Date: 09/29/2020 CLINICAL DATA:  A 87 year old male patient found on the floor, found down. EXAM: CT HEAD WITHOUT CONTRAST CT CERVICAL SPINE WITHOUT CONTRAST TECHNIQUE: Multidetector CT imaging of the head and cervical spine was performed following the standard protocol without intravenous contrast. Multiplanar CT image reconstructions of the cervical spine were also generated. COMPARISON:  Jun 15, 2015. FINDINGS: CT HEAD FINDINGS Brain: No evidence of acute infarction, hemorrhage, hydrocephalus, extra-axial collection or mass lesion/mass effect. Signs of marked atrophy and chronic microvascular ischemic change with worsening of atrophy since previous imaging. Vascular: No hyperdense vessel or  unexpected calcification. Skull: Normal. Negative for fracture or focal lesion. Sinuses/Orbits: No acute finding. Other: None CT CERVICAL SPINE FINDINGS Alignment: Straightening of normal cervical lordotic curvature in the setting of marked spinal degenerative change. Skull base and vertebrae: No acute fracture. No primary bone lesion or focal pathologic process. Soft tissues and spinal canal: No prevertebral fluid or swelling. No visible canal hematoma. Disc levels: Multilevel degenerative changes with marked atlantoaxial degenerative changes. Marked multilevel facet arthropathy on the RIGHT at C2-3, C3-4, C4-5 and to a lesser extent C5-6. Marked disc space narrowing at C5-6 with anterior osteophytes and uncovertebral spurring. Facet arthropathy on the LEFT at this level and the  level below. Upper chest: Negative. Other: None IMPRESSION: No acute intracranial abnormality. Signs of marked atrophy and chronic microvascular ischemic change with worsening of atrophy since previous imaging. No evidence for acute fracture or static subluxation of the cervical spine. Marked multilevel degenerative changes and facet arthropathy of the cervical spine. Electronically Signed   By: Zetta Bills M.D.   On: 09/29/2020 16:20   CT ABDOMEN PELVIS W CONTRAST  Result Date: 09/30/2020 CLINICAL DATA:  Fall EXAM: CT ABDOMEN AND PELVIS WITH CONTRAST TECHNIQUE: Multidetector CT imaging of the abdomen and pelvis was performed using the standard protocol following bolus administration of intravenous contrast. CONTRAST:  81m OMNIPAQUE IOHEXOL 350 MG/ML SOLN COMPARISON:  None. FINDINGS: Lower chest: See concurrently obtained but separately dictated CT scan of the chest. Hepatobiliary: No focal liver abnormality is seen. No gallstones, gallbladder wall thickening, or biliary dilatation. Pancreas: Unremarkable. No pancreatic ductal dilatation or surrounding inflammatory changes. Spleen: Normal in size without focal abnormality. Adrenals/Urinary Tract: Unremarkable adrenal glands. Renal cortical atrophy bilaterally. Nonobstructing bilateral nephrolithiasis. No enhancing renal mass. No ureteral abnormalities. The bladder is within normal limits. Stomach/Bowel: Scattered colonic diverticula without evidence of active inflammation. Normal appendix. No focal bowel wall thickening or evidence of obstruction. Vascular/Lymphatic: Atherosclerotic calcifications throughout the abdominal aorta without evidence of aneurysm. No suspicious lymphadenopathy. Reproductive: Prostate is unremarkable. Other: No abdominal wall hernia or abnormality. No abdominopelvic ascites. Musculoskeletal: No acute or significant osseous findings. Bilateral lower lumbar facet arthropathy. IMPRESSION: 1. No acute abnormality or evidence of injury  in the abdomen or pelvis. 2. Bilateral nonobstructing nephrolithiasis. 3.  Aortic Atherosclerosis (ICD10-I70.0). 4. Lower lumbar facet arthropathy. 5. Scattered colonic diverticula without evidence of active inflammation. Electronically Signed   By: HJacqulynn CadetM.D.   On: 09/30/2020 14:25   CT L-SPINE NO CHARGE  Result Date: 09/30/2020 CLINICAL DATA:  Fall W19.XXXA (ICD-10-CM) EXAM: CT LUMBAR SPINE WITHOUT CONTRAST TECHNIQUE: Multidetector CT imaging of the lumbar spine was performed without intravenous contrast administration. Multiplanar CT image reconstructions were also generated. COMPARISON:  None. FINDINGS: Segmentation: 5 non rib-bearing lumbar vertebral bodies. Alignment: No substantial sagittal subluxation. Mild broad levocurvature. Vertebrae: Vertebral body heights are maintained. No evidence of acute fracture. Diffuse osteopenia. Paraspinal and other soft tissues: Please see concurrent CT abdomen/pelvis for intra-abdominal evaluation. Disc levels: Broad disc bulge at L4-L5 with at least mild canal stenosis. Severe lower lumbar facet arthropathy. Probable foraminal stenosis on the left at L4-L5 and L5-S1. IMPRESSION: 1. No evidence of acute fracture or traumatic malalignment. 2. Severe lower lumbar facet arthropathy with likely at least mild canal stenosis at L4-L5 and probable left foraminal stenosis at L4-L5 and L5-S1. An MRI of the lumbar spine could better characterize the canal and foramina if clinically indicated. 3. Osteopenia. Electronically Signed   By: FMargaretha SheffieldM.D.   On: 09/30/2020 13:58  DG Chest Port 1 View  Result Date: 09/30/2020 CLINICAL DATA:  Unwitnessed fall. EXAM: PORTABLE CHEST 1 VIEW COMPARISON:  09/29/2020 FINDINGS: Stable mildly enlarged cardiac silhouette, sternal wires and prosthetic aortic valve. Interval patchy opacity in both lungs, most pronounced in the lower lung zones. No visible pleural fluid. No fracture or pneumothorax seen. Thoracic spine  degenerative changes. IMPRESSION: Interval patchy bilateral atelectasis or pneumonia. Pulmonary contusions are also a possibility. Electronically Signed   By: Claudie Revering M.D.   On: 09/30/2020 12:09   DG Hip Unilat W or Wo Pelvis 2-3 Views Left  Result Date: 09/29/2020 CLINICAL DATA:  Fall EXAM: DG HIP (WITH OR WITHOUT PELVIS) 2-3V LEFT COMPARISON:  09/10/2020 FINDINGS: Left SI joint is non widened. No definitive fracture or malalignment. Moderate hip arthritis. IMPRESSION: No definite acute osseous abnormality. Electronically Signed   By: Donavan Foil M.D.   On: 09/29/2020 19:04     Personally Reviewed Labs: CBC: Recent Labs  Lab 09/29/20 1740 09/30/20 1141  WBC 32.8* 43.6*  NEUTROABS 26.5*  --   HGB 12.2* 12.8*  HCT 38.5* 38.6*  MCV 91.9 89.8  PLT 745* [id number]*   Basic Metabolic Panel: Recent Labs  Lab 09/29/20 1740 09/30/20 1141  NA 139 143  K 4.6 4.5  CL 108 110  CO2 21* 23  GLUCOSE 106* 125*  BUN 34* 38*  CREATININE 1.63* 1.78*  CALCIUM 9.0 9.4   GFR: Estimated Creatinine Clearance: 27.1 mL/min (A) (by C-G formula based on SCr of 1.78 mg/dL (H)). Liver Function Tests: Recent Labs  Lab 09/30/20 1141  AST 56*  ALT 20  ALKPHOS 89  BILITOT 1.5*  PROT 7.9  ALBUMIN 4.0   No results for input(s): LIPASE, AMYLASE in the last 168 hours. No results for input(s): AMMONIA in the last 168 hours. Coagulation Profile: No results for input(s): INR, PROTIME in the last 168 hours. Cardiac Enzymes: Recent Labs  Lab 09/29/20 1740  CKTOTAL 102   BNP (last 3 results) No results for input(s): PROBNP in the last 8760 hours. HbA1C: No results for input(s): HGBA1C in the last 72 hours. CBG: No results for input(s): GLUCAP in the last 168 hours. Lipid Profile: No results for input(s): CHOL, HDL, LDLCALC, TRIG, CHOLHDL, LDLDIRECT in the last 72 hours. Thyroid Function Tests: No results for input(s): TSH, T4TOTAL, FREET4, T3FREE, THYROIDAB in the last 72 hours. Anemia  Panel: No results for input(s): VITAMINB12, FOLATE, FERRITIN, TIBC, IRON, RETICCTPCT in the last 72 hours. Urine analysis:    Component Value Date/Time   COLORURINE YELLOW (A) 09/30/2020 1141   APPEARANCEUR CLEAR (A) 09/30/2020 1141   LABSPEC 1.025 09/30/2020 1141   PHURINE 5.5 09/30/2020 1141   GLUCOSEU NEGATIVE 09/30/2020 1141   HGBUR LARGE (A) 09/30/2020 1141   BILIRUBINUR NEGATIVE 09/30/2020 1141   KETONESUR TRACE (A) 09/30/2020 1141   PROTEINUR 100 (A) 09/30/2020 1141   UROBILINOGEN 1.0 11/05/2013 2131   NITRITE NEGATIVE 09/30/2020 1141   LEUKOCYTESUR NEGATIVE 09/30/2020 1141    Sepsis Labs:  WBC 44  Personally Reviewed EKG:  First EKG seems to be junctional rhythm with flat T wave Second EKG appears the same except for a lot of artifacts  Assessment/Plan Non-STEMI-carries history of CAD/CABG in 2010.  He denies cardiopulmonary symptoms but reports back pain.  EKG as above.  Troponin 1900>> 2800.  BNP elevated to 2356.  No history of CHF.  TTE in 12/2019 normal. -Full dose aspirin ordered in ED -EDP to discuss with cardiology, Dr. Caryl Comes for further  recommendation -Check echocardiogram -Continue home statin -Continue trending troponin -Follow CTA chest/abdomen/pelvis  Acute CHF-TTE in 12/2019 basically normal.  Seems to be on Lasix at home.  He has lower extremity edema but denies chest pain or dyspnea.  BNP elevated.  CTA chest with moderate right and small left pleural effusion.  BNP elevated.  Received IV Lasix in ED -Redose IV Lasix in the morning based on response and renal function -Update echocardiogram -Monitor fluid and respiratory status -Further cardiology recommendation to follow  History of AS/AVR -TTE as above  Essential hypertension: BP slightly elevated. -IV Lasix as above -Add as needed labetalol  Recurrent fall at facility-this is unwitnessed fall.  Multiple imaging without acute finding. -Fall precaution -PT/OT eval  Leukocytosis with left  shift and thrombocytosis-dates back to 2017.  WBC 37.2 on 8/22, 32 on 9/10 and 44 today.  No fever or clear source of infection but some opacity on chest x-ray.  UA without significant finding other than large Hgb.  Other considerations include bacteremia and malignancy -Check blood culture to exclude bacteremia -IV cefepime and IV vancomycin pending blood cultures -May need hematology/oncology input if no improvement with IV antibiotics  Dementia with behavioral disturbance: He is oriented to self, place and situation but confused and restless. -Reorientation and delirium precautions  CKD-3B/azotemia-creatinine slightly higher than baseline.  No obstructive pathology on CT Recent Labs    01/04/20 1007 01/23/20 1226 09/10/20 1026 09/29/20 1740 09/30/20 1141  BUN 25 32 24* 34* 38*  CREATININE 1.53* 1.58* 1.45* 1.63* 1.78*  -Recheck in the morning  BPH: Denies urinary symptoms. -Monitor urine output  DVT prophylaxis: May start IV heparin after cardiology input Code Status: Full code by default Family Communication: Attempted to call patient's wife but no answer.  Disposition Plan: Admit to progressive care Consults called: Cardiology by EDP Admission status: Inpatient Level of care: Progressive   Mercy Riding MD [facility]  If 7PM-7AM, please contact night-coverage [email or web address]  09/30/2020, 4:47 PM

## 2020-09-30 NOTE — ED Notes (Signed)
Patient transported to CT via stretcher.

## 2020-10-01 ENCOUNTER — Inpatient Hospital Stay (HOSPITAL_COMMUNITY): Payer: Medicare Other

## 2020-10-01 ENCOUNTER — Encounter (HOSPITAL_COMMUNITY): Payer: Self-pay | Admitting: Student

## 2020-10-01 DIAGNOSIS — I5031 Acute diastolic (congestive) heart failure: Secondary | ICD-10-CM

## 2020-10-01 DIAGNOSIS — I509 Heart failure, unspecified: Secondary | ICD-10-CM

## 2020-10-01 DIAGNOSIS — I2583 Coronary atherosclerosis due to lipid rich plaque: Secondary | ICD-10-CM

## 2020-10-01 DIAGNOSIS — I10 Essential (primary) hypertension: Secondary | ICD-10-CM

## 2020-10-01 DIAGNOSIS — I2510 Atherosclerotic heart disease of native coronary artery without angina pectoris: Secondary | ICD-10-CM

## 2020-10-01 DIAGNOSIS — R778 Other specified abnormalities of plasma proteins: Secondary | ICD-10-CM

## 2020-10-01 DIAGNOSIS — E7800 Pure hypercholesterolemia, unspecified: Secondary | ICD-10-CM

## 2020-10-01 LAB — ECHOCARDIOGRAM COMPLETE
AR max vel: 1.37 cm2
AV Area VTI: 1.31 cm2
AV Area mean vel: 1.25 cm2
AV Mean grad: 11 mmHg
AV Peak grad: 17.1 mmHg
Ao pk vel: 2.07 m/s
Area-P 1/2: 4.39 cm2
S' Lateral: 4.3 cm
Weight: 2359.8 [oz_av]

## 2020-10-01 LAB — CBC
HCT: 33.3 % — ABNORMAL LOW (ref 39.0–52.0)
Hemoglobin: 11.2 g/dL — ABNORMAL LOW (ref 13.0–17.0)
MCH: 30 pg (ref 26.0–34.0)
MCHC: 33.6 g/dL (ref 30.0–36.0)
MCV: 89.3 fL (ref 80.0–100.0)
Platelets: 682 10*3/uL — ABNORMAL HIGH (ref 150–400)
RBC: 3.73 MIL/uL — ABNORMAL LOW (ref 4.22–5.81)
RDW: 18.7 % — ABNORMAL HIGH (ref 11.5–15.5)
WBC: 31.1 10*3/uL — ABNORMAL HIGH (ref 4.0–10.5)
nRBC: 0 % (ref 0.0–0.2)

## 2020-10-01 LAB — BASIC METABOLIC PANEL
Anion gap: 8 (ref 5–15)
BUN: 38 mg/dL — ABNORMAL HIGH (ref 8–23)
CO2: 24 mmol/L (ref 22–32)
Calcium: 8.5 mg/dL — ABNORMAL LOW (ref 8.9–10.3)
Chloride: 107 mmol/L (ref 98–111)
Creatinine, Ser: 1.78 mg/dL — ABNORMAL HIGH (ref 0.61–1.24)
GFR, Estimated: 36 mL/min — ABNORMAL LOW (ref >60)
Glucose, Bld: 112 mg/dL — ABNORMAL HIGH (ref 70–99)
Potassium: 3.9 mmol/L (ref 3.5–5.1)
Sodium: 139 mmol/L (ref 135–145)

## 2020-10-01 LAB — URINE CULTURE: Culture: NO GROWTH

## 2020-10-01 LAB — PROCALCITONIN: Procalcitonin: 0.2 ng/mL

## 2020-10-01 MED ORDER — AMLODIPINE BESYLATE 5 MG PO TABS
5.0000 mg | ORAL_TABLET | Freq: Every day | ORAL | Status: DC
  Administered 2020-10-01 – 2020-10-02 (×2): 5 mg via ORAL
  Filled 2020-10-01 (×2): qty 1

## 2020-10-01 NOTE — Progress Notes (Signed)
Progress Note  Patient Name: [name] Date of Encounter: 10/01/2020  [facility] Cardiologist: Larae Grooms, MD   Subjective   Denies any chest pain or SOB.   Inpatient Medications    Scheduled Meds:  aspirin EC  81 mg Oral Daily   atorvastatin  20 mg Oral Daily   enoxaparin (LOVENOX) injection  30 mg Subcutaneous Q24H   Continuous Infusions:  ceFEPime (MAXIPIME) IV 2 g (09/30/20 2127)   [START ON 10/02/2020] vancomycin     PRN Meds: acetaminophen **OR** acetaminophen   Vital Signs    Vitals:   09/30/20 2348 10/01/20 0534 10/01/20 0700 10/01/20 1155  BP: 137/69 (!) 158/67  (!) 142/62  Pulse: 74 76  69  Resp: '18 16 15 20  '$ Temp: 99 F (37.2 C) 98.1 F (36.7 C)  98 F (36.7 C)  TempSrc: Oral Oral  Oral  SpO2: 97% 95% 96% 95%  Weight:        Intake/Output Summary (Last 24 hours) at 10/01/2020 1307 Last data filed at 10/01/2020 1000 Gross per 24 hour  Intake --  Output 2050 ml  Net -2050 ml   Last 3 Weights 09/30/2020 09/29/2020 01/23/2020  Weight (lbs) 147 lb 7.8 oz 156 lb 8.4 oz 155 lb  Weight (kg) 66.9 kg 71 kg 70.308 kg      Telemetry    NSR - Personally Reviewed  ECG    NSR with inferior and anterior infarcts and nonspecific T wave abnormality - Personally Reviewed  Physical Exam   GEN: No acute distress.   Neck: No JVD Cardiac: RRR, no murmurs, rubs, or gallops.  Respiratory: Clear to auscultation bilaterally. GI: Soft, nontender, non-distended  MS: No edema; No deformity. Neuro:  Nonfocal  Psych: Normal affect   Labs    High Sensitivity Troponin:   Recent Labs  Lab 09/30/20 1141 09/30/20 1411  TROPONINIHS 1,908* 2,850*      Chemistry Recent Labs  Lab 09/29/20 1740 09/30/20 1141 10/01/20 0422  NA 139 143 139  K 4.6 4.5 3.9  CL 108 110 107  CO2 21* 23 24  GLUCOSE 106* 125* 112*  BUN 34* 38* 38*  CREATININE 1.63* 1.78* 1.78*  CALCIUM 9.0 9.4 8.5*  PROT  --  7.9  --   ALBUMIN  --  4.0  --   AST  --  56*  --    ALT  --  20  --   ALKPHOS  --  89  --   BILITOT  --  1.5*  --   GFRNONAA 40* 36* 36*  ANIONGAP '10 10 8     '$ Hematology Recent Labs  Lab 09/29/20 1740 09/30/20 1141 10/01/20 0422  WBC 32.8* 43.6* 31.1*  RBC 4.19* 4.30 3.73*  HGB 12.2* 12.8* 11.2*  HCT 38.5* 38.6* 33.3*  MCV 91.9 89.8 89.3  MCH 29.1 29.8 30.0  MCHC 31.7 33.2 33.6  RDW 18.6* 18.8* 18.7*  PLT 745* 756* 682*    BNP Recent Labs  Lab 09/30/20 1141  BNP 2,356.4*     DDimer No results for input(s): DDIMER in the last 168 hours.  Radiology    DG Chest 2 View  Result Date: 09/29/2020 CLINICAL DATA:  Fall EXAM: CHEST - 2 VIEW COMPARISON:  09/10/2020, 04/08/2015 FINDINGS: Post sternotomy changes. Cardiomegaly with aortic atherosclerosis. Chronic pleural and parenchymal scarring at the left base. Mild interstitial prominence could be due to edema. Aortic valve prosthesis. No visible pneumothorax. IMPRESSION: 1. Cardiomegaly and mild diffuse interstitial opacity which  could be due to mild edema 2. Chronic pleural and parenchymal scarring at left lung base. Electronically Signed   By: Donavan Foil M.D.   On: 09/29/2020 17:20   DG Lumbar Spine Complete  Result Date: 09/29/2020 CLINICAL DATA:  [place], back pain EXAM: LUMBAR SPINE - COMPLETE 4+ VIEW COMPARISON:  None. FINDINGS: Frontal, bilateral oblique, lateral views of the lumbar spine are obtained. There are 5 non-rib-bearing lumbar type vertebral bodies in normal anatomic alignment. No acute displaced fractures. There is prominent spondylosis at the thoracolumbar junction with bridging anterior osteophytes. Significant facet hypertrophy is seen within the lower lumbar spine greatest at L3-4, L4-5, and L5-S1. Sacroiliac joints are unremarkable. Visualized portions of the bony pelvis are normal. IMPRESSION: 1. Multilevel thoracolumbar spondylosis and facet hypertrophy. No acute bony abnormality. Electronically Signed   By: Randa Ngo M.D.   On: 09/29/2020 17:21   DG  Pelvis 1-2 Views  Result Date: 09/29/2020 CLINICAL DATA:  Fall EXAM: PELVIS - 1-2 VIEW COMPARISON:  09/10/2020 FINDINGS: SI joints show mild degenerative change but are non widened. Pubic symphysis and rami appear intact. No definitive fracture or malalignment. An oblique lucency over the left femoral neck appears to represent a skin fold artifact. Moderate bilateral hip arthritis IMPRESSION: 1. No definite acute osseous abnormality. A lucency over the left femoral neck is suspected to be due to fold artifact 2. Cross-sectional imaging should be pursued if high clinical suspicion for hip fracture Electronically Signed   By: Donavan Foil M.D.   On: 09/29/2020 17:37   DG Elbow Complete Left  Result Date: 09/30/2020 CLINICAL DATA:  Fall in a 87 year old male with bilateral skin tears to the elbows. EXAM: LEFT ELBOW - COMPLETE 3+ VIEW COMPARISON:  Contralateral elbow FINDINGS: Small well corticated bone fragments adjacent to the lateral epicondyle. No acute fracture or dislocation. No substantial soft tissue swelling or sign of joint effusion. IMPRESSION: No acute fracture or traumatic malalignment. Small well corticated bone fragments adjacent to the lateral epicondyle may reflect remote injury or sequela of prior epicondylitis. Electronically Signed   By: Zetta Bills M.D.   On: 09/30/2020 12:08   DG Elbow Complete Right  Result Date: 09/30/2020 CLINICAL DATA:  Right elbow pain and skin tears following a fall. EXAM: RIGHT ELBOW - COMPLETE 3+ VIEW COMPARISON:  09/29/2020 FINDINGS: Decreased posterior elbow swelling. No fracture, dislocation or effusion. IMPRESSION: No fracture or dislocation. Electronically Signed   By: Claudie Revering M.D.   On: 09/30/2020 12:12   DG Elbow Complete Right  Result Date: 09/29/2020 CLINICAL DATA:  Fall with laceration EXAM: RIGHT ELBOW - COMPLETE 3+ VIEW COMPARISON:  None. FINDINGS: There is no evidence of fracture, or dislocation. Slightly limited lateral view due to  nonstandard positioning. Posterior elbow swelling. No gross effusion IMPRESSION: No definite acute osseous abnormality Electronically Signed   By: Donavan Foil M.D.   On: 09/29/2020 17:21   CT HEAD WO CONTRAST  Result Date: 09/30/2020 CLINICAL DATA:  Head trauma. EXAM: CT HEAD WITHOUT CONTRAST TECHNIQUE: Contiguous axial images were obtained from the base of the skull through the vertex without intravenous contrast. COMPARISON:  September 29, 2020 FINDINGS: Brain: No evidence of acute infarction, hemorrhage, hydrocephalus, extra-axial collection or mass lesion/mass effect. Moderate brain parenchymal volume loss and deep white matter microangiopathy. Vascular: No hyperdense vessel or unexpected calcification. Skull: Normal. Negative for fracture or focal lesion. Sinuses/Orbits: No acute finding. Other: None. IMPRESSION: 1. No acute intracranial abnormality. 2. Moderate brain parenchymal atrophy and chronic microvascular disease.  Electronically Signed   By: Fidela Salisbury M.D.   On: 09/30/2020 14:21   CT HEAD WO CONTRAST (5MM)  Result Date: 09/29/2020 CLINICAL DATA:  A 87 year old male patient found on the floor, found down. EXAM: CT HEAD WITHOUT CONTRAST CT CERVICAL SPINE WITHOUT CONTRAST TECHNIQUE: Multidetector CT imaging of the head and cervical spine was performed following the standard protocol without intravenous contrast. Multiplanar CT image reconstructions of the cervical spine were also generated. COMPARISON:  Jun 15, 2015. FINDINGS: CT HEAD FINDINGS Brain: No evidence of acute infarction, hemorrhage, hydrocephalus, extra-axial collection or mass lesion/mass effect. Signs of marked atrophy and chronic microvascular ischemic change with worsening of atrophy since previous imaging. Vascular: No hyperdense vessel or unexpected calcification. Skull: Normal. Negative for fracture or focal lesion. Sinuses/Orbits: No acute finding. Other: None CT CERVICAL SPINE FINDINGS Alignment: Straightening of  normal cervical lordotic curvature in the setting of marked spinal degenerative change. Skull base and vertebrae: No acute fracture. No primary bone lesion or focal pathologic process. Soft tissues and spinal canal: No prevertebral fluid or swelling. No visible canal hematoma. Disc levels: Multilevel degenerative changes with marked atlantoaxial degenerative changes. Marked multilevel facet arthropathy on the RIGHT at C2-3, C3-4, C4-5 and to a lesser extent C5-6. Marked disc space narrowing at C5-6 with anterior osteophytes and uncovertebral spurring. Facet arthropathy on the LEFT at this level and the level below. Upper chest: Negative. Other: None IMPRESSION: No acute intracranial abnormality. Signs of marked atrophy and chronic microvascular ischemic change with worsening of atrophy since previous imaging. No evidence for acute fracture or static subluxation of the cervical spine. Marked multilevel degenerative changes and facet arthropathy of the cervical spine. Electronically Signed   By: Zetta Bills M.D.   On: 09/29/2020 16:20   CT CHEST WO CONTRAST  Result Date: 09/30/2020 CLINICAL DATA:  Fall EXAM: CT CHEST WITHOUT CONTRAST TECHNIQUE: Multidetector CT imaging of the chest was performed following the standard protocol without IV contrast. COMPARISON:  Prior CT scan of the chest 04/30/2015 FINDINGS: Cardiovascular: Limited evaluation in the absence of intravenous contrast. No evidence of aortic aneurysm. Scattered atherosclerotic plaque throughout the abdominal aorta. Mild cardiomegaly. Extensive coronary artery atherosclerotic calcifications. Surgical changes of prior aortic valve replacement. No pericardial effusion. Mediastinum/Nodes: Unremarkable CT appearance of the thyroid gland. No suspicious mediastinal or hilar adenopathy. No soft tissue mediastinal mass. The thoracic esophagus is unremarkable. Lungs/Pleura: Small left and moderate right layering pleural effusions. Diffuse chronic bronchial  wall thickening. Atelectasis present in both lower lobes. Mild interlobular septal thickening in the apices and bases. No suspicious pulmonary mass or nodule. Upper Abdomen: No acute abnormality. Musculoskeletal: No chest wall mass or suspicious bone lesions identified. Mild dextroconvex scoliosis. IMPRESSION: 1. No evidence of acute injury to the thorax. 2. Cardiomegaly with extensive coronary artery calcifications and surgical changes of prior aortic valve replacement. 3. Moderate right and small left layering pleural effusions. Combined with trace interstitial pulmonary edema, findings suggest mild or chronic CHF. 4. Diffuse chronic bronchial wall thickening bilaterally. Aortic Atherosclerosis (ICD10-I70.0). Electronically Signed   By: Jacqulynn Cadet M.D.   On: 09/30/2020 14:31   CT CERVICAL SPINE WO CONTRAST  Result Date: 09/30/2020 CLINICAL DATA:  Post fall.  Neck trauma. EXAM: CT CERVICAL SPINE WITHOUT CONTRAST TECHNIQUE: Multidetector CT imaging of the cervical spine was performed without intravenous contrast. Multiplanar CT image reconstructions were also generated. COMPARISON:  September 29, 2020 FINDINGS: Alignment: Normal. Skull base and vertebrae: No acute fracture. No primary bone lesion or focal pathologic process.  Soft tissues and spinal canal: No prevertebral fluid or swelling. No visible canal hematoma. Disc levels:  Multilevel osteoarthritic changes. Upper chest: Negative. Other: None. IMPRESSION: 1. No evidence of acute traumatic injury to the cervical spine. 2. Multilevel osteoarthritic changes of the cervical spine. Electronically Signed   By: Fidela Salisbury M.D.   On: 09/30/2020 14:26   CT Cervical Spine Wo Contrast  Result Date: 09/29/2020 CLINICAL DATA:  A 87 year old male patient found on the floor, found down. EXAM: CT HEAD WITHOUT CONTRAST CT CERVICAL SPINE WITHOUT CONTRAST TECHNIQUE: Multidetector CT imaging of the head and cervical spine was performed following the  standard protocol without intravenous contrast. Multiplanar CT image reconstructions of the cervical spine were also generated. COMPARISON:  Jun 15, 2015. FINDINGS: CT HEAD FINDINGS Brain: No evidence of acute infarction, hemorrhage, hydrocephalus, extra-axial collection or mass lesion/mass effect. Signs of marked atrophy and chronic microvascular ischemic change with worsening of atrophy since previous imaging. Vascular: No hyperdense vessel or unexpected calcification. Skull: Normal. Negative for fracture or focal lesion. Sinuses/Orbits: No acute finding. Other: None CT CERVICAL SPINE FINDINGS Alignment: Straightening of normal cervical lordotic curvature in the setting of marked spinal degenerative change. Skull base and vertebrae: No acute fracture. No primary bone lesion or focal pathologic process. Soft tissues and spinal canal: No prevertebral fluid or swelling. No visible canal hematoma. Disc levels: Multilevel degenerative changes with marked atlantoaxial degenerative changes. Marked multilevel facet arthropathy on the RIGHT at C2-3, C3-4, C4-5 and to a lesser extent C5-6. Marked disc space narrowing at C5-6 with anterior osteophytes and uncovertebral spurring. Facet arthropathy on the LEFT at this level and the level below. Upper chest: Negative. Other: None IMPRESSION: No acute intracranial abnormality. Signs of marked atrophy and chronic microvascular ischemic change with worsening of atrophy since previous imaging. No evidence for acute fracture or static subluxation of the cervical spine. Marked multilevel degenerative changes and facet arthropathy of the cervical spine. Electronically Signed   By: Zetta Bills M.D.   On: 09/29/2020 16:20   CT ABDOMEN PELVIS W CONTRAST  Result Date: 09/30/2020 CLINICAL DATA:  Fall EXAM: CT ABDOMEN AND PELVIS WITH CONTRAST TECHNIQUE: Multidetector CT imaging of the abdomen and pelvis was performed using the standard protocol following bolus administration of  intravenous contrast. CONTRAST:  71m OMNIPAQUE IOHEXOL 350 MG/ML SOLN COMPARISON:  None. FINDINGS: Lower chest: See concurrently obtained but separately dictated CT scan of the chest. Hepatobiliary: No focal liver abnormality is seen. No gallstones, gallbladder wall thickening, or biliary dilatation. Pancreas: Unremarkable. No pancreatic ductal dilatation or surrounding inflammatory changes. Spleen: Normal in size without focal abnormality. Adrenals/Urinary Tract: Unremarkable adrenal glands. Renal cortical atrophy bilaterally. Nonobstructing bilateral nephrolithiasis. No enhancing renal mass. No ureteral abnormalities. The bladder is within normal limits. Stomach/Bowel: Scattered colonic diverticula without evidence of active inflammation. Normal appendix. No focal bowel wall thickening or evidence of obstruction. Vascular/Lymphatic: Atherosclerotic calcifications throughout the abdominal aorta without evidence of aneurysm. No suspicious lymphadenopathy. Reproductive: Prostate is unremarkable. Other: No abdominal wall hernia or abnormality. No abdominopelvic ascites. Musculoskeletal: No acute or significant osseous findings. Bilateral lower lumbar facet arthropathy. IMPRESSION: 1. No acute abnormality or evidence of injury in the abdomen or pelvis. 2. Bilateral nonobstructing nephrolithiasis. 3.  Aortic Atherosclerosis (ICD10-I70.0). 4. Lower lumbar facet arthropathy. 5. Scattered colonic diverticula without evidence of active inflammation. Electronically Signed   By: HJacqulynn CadetM.D.   On: 09/30/2020 14:25   CT L-SPINE NO CHARGE  Result Date: 09/30/2020 CLINICAL DATA:  Fall W19.XMerril Abbe(ICD-10-CM) EXAM:  CT LUMBAR SPINE WITHOUT CONTRAST TECHNIQUE: Multidetector CT imaging of the lumbar spine was performed without intravenous contrast administration. Multiplanar CT image reconstructions were also generated. COMPARISON:  None. FINDINGS: Segmentation: 5 non rib-bearing lumbar vertebral bodies. Alignment: No  substantial sagittal subluxation. Mild broad levocurvature. Vertebrae: Vertebral body heights are maintained. No evidence of acute fracture. Diffuse osteopenia. Paraspinal and other soft tissues: Please see concurrent CT abdomen/pelvis for intra-abdominal evaluation. Disc levels: Broad disc bulge at L4-L5 with at least mild canal stenosis. Severe lower lumbar facet arthropathy. Probable foraminal stenosis on the left at L4-L5 and L5-S1. IMPRESSION: 1. No evidence of acute fracture or traumatic malalignment. 2. Severe lower lumbar facet arthropathy with likely at least mild canal stenosis at L4-L5 and probable left foraminal stenosis at L4-L5 and L5-S1. An MRI of the lumbar spine could better characterize the canal and foramina if clinically indicated. 3. Osteopenia. Electronically Signed   By: Margaretha Sheffield M.D.   On: 09/30/2020 13:58   DG Chest Port 1 View  Result Date: 09/30/2020 CLINICAL DATA:  Unwitnessed fall. EXAM: PORTABLE CHEST 1 VIEW COMPARISON:  09/29/2020 FINDINGS: Stable mildly enlarged cardiac silhouette, sternal wires and prosthetic aortic valve. Interval patchy opacity in both lungs, most pronounced in the lower lung zones. No visible pleural fluid. No fracture or pneumothorax seen. Thoracic spine degenerative changes. IMPRESSION: Interval patchy bilateral atelectasis or pneumonia. Pulmonary contusions are also a possibility. Electronically Signed   By: Claudie Revering M.D.   On: 09/30/2020 12:09   DG Swallowing Func-Speech Pathology  Result Date: 10/01/2020 Table formatting from the original result was not included. Objective Swallowing Evaluation: Type of Study: MBS-Modified Barium Swallow Study  Patient Details Name: HAYDAR MERRETT MRN: [id number] Date of Birth: 18-Nov-1926 Today's Date: 10/01/2020 Time: SLP Start Time (ACUTE ONLY): [id number] -SLP Stop Time (ACUTE ONLY): [id number] SLP Time Calculation (min) (ACUTE ONLY): 20 min Past Medical History: Past Medical History: Diagnosis Date  BPH (benign  prostatic hyperplasia)   Coronary artery disease with hx of myocardial infarct w/o hx of CABG 2010  Dehydration   Diverticulosis   HOH (hard of hearing)   hearing aids  Hyperlipidemia   Hypertension   Impaired fasting glucose   Memory difficulty 10/19/2012  Memory loss   Mesenteric panniculitis ([place])   Retinal tear   Severe aortic stenosis 2010  S/P AVR-TISSUE VALVE Past Surgical History: Past Surgical History: Procedure Laterality Date  2 VESSEL CABG    AORTIC VALVE REPLACEMENT    BARTLE    CATARACT EXTRACTION, BILATERAL    RT 2010 LT 2014  TONSILLECTOMY   HPI: Patient is a 87 y.o. male with PMH: dementia, CAD s/p CABG in 2010, AS s/p AVR, HTN and BPH returning to ED after recurrent fall. In ED, vitals WNL except slightly elevated BP, CXR revealed atelectasis/possible pneumonia/possible contusion, CT head and spine without acute finding, CTA chest showed cardiomegaly, moderate right and small left pleural effusion.  Subjective: pleasant, cooperative Assessment / Plan / Recommendation CHL IP CLINICAL IMPRESSIONS 10/01/2020 Clinical Impression Patient presents with a mild-moderate oropharyngeal dysphagia with a suspected cervical esophageal component as well. Patient exhibited oral delays with all tested boluses and when taking straw sips of thin liquids or nectar thick liquids, would appear to fill up oral cavity and hold liquid until fairly full prior to swallow. Premature spillage from oral cavity into vallecular sinus was observed with thin liquids. Patient exhibited piecemeal swallowing with puree solids. During pharyngeal phase, patient exhibited trace penetration (PAS 3) during the swallow with  thin liquids via straw sips but no aspiration. He did exhibit aspiration of trace amount (PAS 8) during and after the swallow when consuming thin liquids via cup sips. SLP suspects patients awareness to liquids during cup sips was poor leading to aspiration, however improved when using straw sips. No penetration or  aspiration observed with straw sips of nectar thick liquids. Puree solids resulted in trace to mild vallecular and pyriform sinus residuals as well as reduced pharyngeal transit. Trace residuals observed in pharynx in vallecular and pyriform sinuses with thin liquids and nectar thick liquids. Patient exhibited reduced UES relaxation with retention of barium just above UES observed. In addition, SLP observed presence of likely cervical osteophyte (no radiologist present to confirm). SLP is recommending Dys 1, nectar thick liquids but allow thin liquids in between meals with straw only (no cup sips). SLP Visit Diagnosis -- Attention and concentration deficit following -- Frontal lobe and executive function deficit following -- Impact on safety and function --   CHL IP TREATMENT RECOMMENDATION 10/01/2020 Treatment Recommendations Therapy as outlined in treatment plan below   Prognosis 10/01/2020 Prognosis for Safe Diet Advancement Fair Barriers to Reach Goals Cognitive deficits Barriers/Prognosis Comment patient with h/o dementia CHL IP DIET RECOMMENDATION 10/01/2020 SLP Diet Recommendations Dysphagia 1 (Puree) solids;Nectar thick liquid;Other (Comment) Liquid Administration via Straw;Other (Comment) Medication Administration Crushed with puree Compensations Minimize environmental distractions;Small sips/bites;Slow rate Postural Changes --   CHL IP OTHER RECOMMENDATIONS 10/01/2020 Recommended Consults -- Oral Care Recommendations Oral care BID;Staff/trained caregiver to provide oral care Other Recommendations Order thickener from pharmacy;Prohibited food (jello, ice cream, thin soups);Remove water pitcher;Clarify dietary restrictions   CHL IP FOLLOW UP RECOMMENDATIONS 10/01/2020 Follow up Recommendations 24 hour supervision/assistance;Skilled Nursing facility   [facility] IP FREQUENCY AND DURATION 10/01/2020 Speech Therapy Frequency (ACUTE ONLY) min 1 x/week Treatment Duration 1 week      CHL IP ORAL PHASE 10/01/2020 Oral Phase  Impaired Oral - Pudding Teaspoon -- Oral - Pudding Cup -- Oral - Honey Teaspoon -- Oral - Honey Cup -- Oral - Nectar Teaspoon -- Oral - Nectar Cup -- Oral - Nectar Straw Weak lingual manipulation;Delayed oral transit;Piecemeal swallowing;Reduced posterior propulsion Oral - Thin Teaspoon -- Oral - Thin Cup Weak lingual manipulation;Holding of bolus;Piecemeal swallowing;Delayed oral transit;Reduced posterior propulsion;Premature spillage Oral - Thin Straw Reduced posterior propulsion;Delayed oral transit;Weak lingual manipulation;Holding of bolus;Premature spillage Oral - Puree Weak lingual manipulation;Reduced posterior propulsion;Delayed oral transit;Piecemeal swallowing Oral - Mech Soft -- Oral - Regular -- Oral - Multi-Consistency -- Oral - Pill Weak lingual manipulation;Reduced posterior propulsion;Delayed oral transit Oral Phase - Comment --  CHL IP PHARYNGEAL PHASE 10/01/2020 Pharyngeal Phase Impaired Pharyngeal- Pudding Teaspoon -- Pharyngeal -- Pharyngeal- Pudding Cup -- Pharyngeal -- Pharyngeal- Honey Teaspoon -- Pharyngeal -- Pharyngeal- Honey Cup -- Pharyngeal -- Pharyngeal- Nectar Teaspoon -- Pharyngeal -- Pharyngeal- Nectar Cup -- Pharyngeal -- Pharyngeal- Nectar Straw Pharyngeal residue - valleculae;Pharyngeal residue - pyriform;Reduced anterior laryngeal mobility Pharyngeal -- Pharyngeal- Thin Teaspoon -- Pharyngeal -- Pharyngeal- Thin Cup Reduced anterior laryngeal mobility;Penetration/Aspiration during swallow;Penetration/Apiration after swallow;Trace aspiration;Pharyngeal residue - valleculae;Pharyngeal residue - pyriform Pharyngeal Material enters airway, passes BELOW cords without attempt by patient to eject out (silent aspiration);Material enters airway, CONTACTS cords and not ejected out Pharyngeal- Thin Straw Delayed swallow initiation-vallecula;Penetration/Aspiration during swallow;Pharyngeal residue - valleculae;Pharyngeal residue - pyriform;Reduced anterior laryngeal mobility;Reduced  airway/laryngeal closure Pharyngeal Material enters airway, remains ABOVE vocal cords and not ejected out Pharyngeal- Puree Delayed swallow initiation-vallecula;Pharyngeal residue - pyriform;Pharyngeal residue - valleculae;Reduced anterior laryngeal mobility;Reduced pharyngeal peristalsis Pharyngeal --  Pharyngeal- Mechanical Soft -- Pharyngeal -- Pharyngeal- Regular -- Pharyngeal -- Pharyngeal- Multi-consistency -- Pharyngeal -- Pharyngeal- Pill Delayed swallow initiation-vallecula;Pharyngeal residue - pyriform;Pharyngeal residue - valleculae;Reduced pharyngeal peristalsis Pharyngeal -- Pharyngeal Comment --  CHL IP CERVICAL ESOPHAGEAL PHASE 10/01/2020 Cervical Esophageal Phase Impaired Pudding Teaspoon -- Pudding Cup -- Honey Teaspoon -- Honey Cup -- Nectar Teaspoon -- Nectar Cup -- Nectar Straw Reduced cricopharyngeal relaxation Thin Teaspoon -- Thin Cup Reduced cricopharyngeal relaxation Thin Straw Reduced cricopharyngeal relaxation Puree Reduced cricopharyngeal relaxation Mechanical Soft -- Regular -- Multi-consistency -- Pill Reduced cricopharyngeal relaxation Cervical Esophageal Comment -- Sonia Baller, MA, CCC-SLP Speech Therapy             CT Angio Chest/Abd/Pel for Dissection W and/or Wo Contrast  Result Date: 09/30/2020 CLINICAL DATA:  Abdominal pain. Elevated troponin. Clinical concern for aortic dissection. EXAM: CT ANGIOGRAPHY CHEST, ABDOMEN AND PELVIS TECHNIQUE: Non-contrast CT of the chest was initially obtained. Multidetector CT imaging through the chest, abdomen and pelvis was performed using the standard protocol during bolus administration of intravenous contrast. Multiplanar reconstructed images and MIPs were obtained and reviewed to evaluate the vascular anatomy. CONTRAST:  69m OMNIPAQUE IOHEXOL 350 MG/ML SOLN COMPARISON:  Chest CT obtained without contrast earlier today and abdomen and pelvis CT obtained with contrast earlier today. FINDINGS: The images are limited by photon starvation  and streak artifacts produced by the patient's arms not being raised above his head. CTA CHEST FINDINGS Cardiovascular: Median sternotomy wires, post CABG changes and prosthetic aortic valve. Aortic and coronary artery calcifications. No aortic aneurysm or dissection. Mildly enlarged heart with left ventricular and biatrial enlargement. Mediastinum/Nodes: No enlarged mediastinal, hilar, or axillary lymph nodes. Thyroid gland, trachea, and esophagus demonstrate no significant findings. Lungs/Pleura: Stable moderate right and small left pleural effusions, prominent pulmonary vasculature, mildly prominent interstitial markings, diffuse peribronchial thickening and mild bilateral dependent atelectasis. Musculoskeletal: Thoracic and lower cervical spine degenerative changes, including changes of DISH. Review of the MIP images confirms the above findings. CTA ABDOMEN AND PELVIS FINDINGS VASCULAR Aorta: Extensive calcified and noncalcified mural plaque formation in the abdominal aorta, most pronounced in the infrarenal region. In that area, this is causing approximately 50% luminal narrowing. No aneurysm or acute dissection seen. Celiac: Calcified and noncalcified plaque at the origin producing greater than 90% stenosis. Normally opacified distally. SMA: Calcified plaque formation at the origin and proximally, producing approximately 50% stenosis. Renals: Dense calcified plaque formation at the origins causing approximately 90% or greater stenosis bilaterally. IMA: Noncalcified plaque at the origin producing approximately 90% stenosis. Normally opacified distally. Inflow: Bilateral common iliac calcified plaque formation causing about 50% luminal stenosis on the left and 20% luminal stenosis on the right. There is also mild calcified plaque formation involving the internal iliac arteries bilaterally. The external iliac arteries are unremarkable. There is some calcified plaque involving both common femoral arteries without  significant luminal stenosis. Veins: No obvious venous abnormality within the limitations of this arterial phase study. Review of the MIP images confirms the above findings. NON-VASCULAR Hepatobiliary: No focal liver abnormality is seen. No gallstones, gallbladder wall thickening, or biliary dilatation. Pancreas: Unremarkable. No pancreatic ductal dilatation or surrounding inflammatory changes. Spleen: Mildly prominent without abnormal enlargement. Adrenals/Urinary Tract: Normal appearing adrenal glands. Small bilateral renal calculi. Excreted contrast in the urinary bladder. Unremarkable ureters. Stomach/Bowel: Multiple colonic diverticula without evidence of diverticulitis. Normal appearing appendix. Unremarkable small bowel and stomach. Lymphatic: No enlarged lymph nodes. Reproductive: Moderately enlarged prostate gland. Other: Small to moderate-sized left inguinal hernia containing  fat and small right inguinal hernia containing fat. Musculoskeletal: Lumbar spine degenerative changes. Review of the MIP images confirms the above findings. IMPRESSION: 1. No aortic aneurysm or dissection. 2. Extensive arterial atheromatous changes causing approximately 50% luminal stenosis of the infrarenal abdominal aorta, greater than 90% stenosis of the proximal celiac axis, approximately 50% stenosis of the proximal SMA, approximately 90% or greater stenosis of the renal arteries bilaterally and approximately 90% stenosis of the proximal IMA. 3. Stable changes of congestive heart failure including bilateral pleural effusions, interstitial pulmonary edema, pulmonary vascular congestion and bronchitic changes. 4. Colonic diverticulosis. 5. Bilateral nonobstructing renal calculi. Electronically Signed   By: Claudie Revering M.D.   On: 09/30/2020 17:36   DG Hip Unilat W or Wo Pelvis 2-3 Views Left  Result Date: 09/29/2020 CLINICAL DATA:  Fall EXAM: DG HIP (WITH OR WITHOUT PELVIS) 2-3V LEFT COMPARISON:  09/10/2020 FINDINGS: Left SI  joint is non widened. No definitive fracture or malalignment. Moderate hip arthritis. IMPRESSION: No definite acute osseous abnormality. Electronically Signed   By: Donavan Foil M.D.   On: 09/29/2020 19:04    Cardiac Studies   2D echo 01/11/2020 IMPRESSIONS    1. Left ventricular ejection fraction, by estimation, is 60 to 65%. The  left ventricle has normal function. The left ventricle has no regional  wall motion abnormalities. Left ventricular diastolic parameters were  normal.   2. Right ventricular systolic function is normal. The right ventricular  size is normal.   3. Left atrial size was moderately dilated.   4. The mitral valve is degenerative. Mild mitral valve regurgitation. No  evidence of mitral stenosis. Moderate mitral annular calcification.   5. Post AVR with bioprosthetic valve type unknown No PVL, normal DVI 0.55  and mean gradient 10 mmHg. The aortic valve has been repaired/replaced.  Aortic valve regurgitation is not visualized. No aortic stenosis is  present.   6. The inferior vena cava is normal in size with greater than 50%  respiratory variability, suggesting right atrial pressure of 3 mmHg.   Patient Profile     87 y.o. male with a hx of AVR CABG, HTN   who is being seen 09/30/2020 for the evaluation of fall with + troponin  at the request of Dr ER MD.  Assessment & Plan    Elevated Troponin -this occurred in setting of fall where she laid all night on the floor, moderate renal insuff and severe Leukocytosis  -suspect thatTrop related to demand ischemia in setting of worsening renal function, known CAD, acute CHF and rhabo from fall -BNP is elevated at 2356 with Chest CTA showing bilateral pleural effusions, interstitial pulmonary edema and pulmonary vascular congestion -2D echo pending>>If LVF is normal then no further ischemic workup recommended given her advanced age and CKD  PAD -Chest CTA showed extensive arterial atheromatous changes with 50%  infrarenal AA luminal stenosis, >90% stenosis of pCeliac axis, 50% pSMA, 90% bilateral RAS and 90% IMA stenosis -avoid statin in setting of rhabdo  CAD -s/p remote CABG in 2010 -he has not had any angina recently -no BB due to bradycardia -continue ASA '81mg'$  daily and statin  HTN -BP controlled on exam today -amlodipine on hold  Acute CHF -BNP elevated at 2356 -2D echo in Dec 2021 showed normal LVF -repeat echo pending -diuretics on hold due to AKI on CKD and minimal edema on exam    I have spent a total of 35 minutes with patient reviewing 2D echo Dec 2021 , telemetry, EKGs,  labs and examining patient as well as establishing an assessment and plan that was discussed with the patient.  > 50% of time was spent in direct patient care.     For questions or updates, please contact [place] Please consult [email or web address] for contact info under        Signed, Fransico Him, MD  10/01/2020, 1:07 PM

## 2020-10-01 NOTE — Evaluation (Signed)
Clinical/Bedside Swallow Evaluation Patient Details  Name: [name] MRN: [id number] Date of Birth: 06-28-1926  Today's Date: 10/01/2020 Time: SLP Start Time (ACUTE ONLY): 0935 SLP Stop Time (ACUTE ONLY): 0950 SLP Time Calculation (min) (ACUTE ONLY): 15 min  Past Medical History:  Past Medical History:  Diagnosis Date   BPH (benign prostatic hyperplasia)    Coronary artery disease with hx of myocardial infarct w/o hx of CABG 2010   Dehydration    Diverticulosis    HOH (hard of hearing)    hearing aids   Hyperlipidemia    Hypertension    Impaired fasting glucose    Memory difficulty 10/19/2012   Memory loss    Mesenteric panniculitis ([place])    Retinal tear    Severe aortic stenosis 2010   S/P AVR-TISSUE VALVE   Past Surgical History:  Past Surgical History:  Procedure Laterality Date   2 VESSEL CABG     AORTIC VALVE REPLACEMENT     BARTLE     CATARACT EXTRACTION, BILATERAL     RT 2010 LT 2014   TONSILLECTOMY     HPI:  Patient is a 87 y.o. male with PMH: dementia, CAD s/p CABG in 2010, AS s/p AVR, HTN and BPH returning to ED after recurrent fall. In ED, vitals WNL except slightly elevated BP, CXR revealed atelectasis/possible pneumonia/possible contusion, CT head and spine without acute finding, CTA chest showed cardiomegaly, moderate right and small left pleural effusion.   Assessment / Plan / Recommendation Clinical Impression  Patient presents with an oropharyngeal dysphagia. During oral phase, he exhibited delays in transit with puree solids but not with liquids. He required hand over hand assist to hold cup and had difficulty tipping cup so straw was used. SLP observed patient to take multiple swallows with each sip and seemed to be squeezing during pharyngeal contractions. MBS recommended secondary to observed difficulties and to r/o aspiration. SLP Visit Diagnosis: Dysphagia, unspecified (R13.10)    Aspiration Risk  Mild aspiration risk;Moderate aspiration risk     Diet Recommendation Dysphagia 1 (Puree);Thin liquid   Liquid Administration via: Cup;Straw Medication Administration: Crushed with puree Supervision: Staff to assist with self feeding;Full supervision/cueing for compensatory strategies Compensations: Minimize environmental distractions;Small sips/bites;Slow rate Postural Changes: Seated upright at 90 degrees    Other  Recommendations Oral Care Recommendations: Oral care BID;Staff/trained caregiver to provide oral care   Follow up Recommendations Other (comment) (TBD)      Frequency and Duration min 2x/week  1 week       Prognosis Prognosis for Safe Diet Advancement: Fair Barriers to Reach Goals: Cognitive deficits Barriers/Prognosis Comment: patient with h/o dementia      Swallow Study   General Date of Onset: 09/30/20 HPI: Patient is a 87 y.o. male with PMH: dementia, CAD s/p CABG in 2010, AS s/p AVR, HTN and BPH returning to ED after recurrent fall. In ED, vitals WNL except slightly elevated BP, CXR revealed atelectasis/possible pneumonia/possible contusion, CT head and spine without acute finding, CTA chest showed cardiomegaly, moderate right and small left pleural effusion. Type of Study: Bedside Swallow Evaluation Previous Swallow Assessment: None found Diet Prior to this Study: Regular;Thin liquids Temperature Spikes Noted: No Respiratory Status: Room air History of Recent Intubation: No Behavior/Cognition: Alert;Cooperative;Pleasant mood Oral Cavity Assessment: Within Functional Limits Oral Care Completed by SLP: Yes Oral Cavity - Dentition: Dentures, top;Dentures, bottom Self-Feeding Abilities: Needs set up;Needs assist Patient Positioning: Upright in bed Baseline Vocal Quality: Normal Volitional Cough: Weak Volitional Swallow: Unable to elicit  Oral/Motor/Sensory Function Overall Oral Motor/Sensory Function: Within functional limits   Ice Chips     Thin Liquid Thin Liquid: Impaired Presentation:  Straw;Cup Pharyngeal  Phase Impairments: Multiple swallows;Wet Vocal Quality    Nectar Thick     Honey Thick     Puree Puree: Impaired Oral Phase Functional Implications: Prolonged oral transit Pharyngeal Phase Impairments: Multiple swallows   Solid     Solid: Not tested     Allen Baller, MA, CCC-SLP Speech Therapy

## 2020-10-01 NOTE — Progress Notes (Signed)
PT Cancellation Note  Patient Details Name: [name] MRN: [id number] DOB: [birth year]   Cancelled Treatment:    Reason Eval/Treat Not Completed: Patient at procedure or test/unavailable   Kati L Payson 10/01/2020, 11:44 AM Arlyce Dice, DPT Acute Rehabilitation Services Pager: [phone] Office: [phone]

## 2020-10-01 NOTE — Progress Notes (Signed)
OT Cancellation Note  Patient Details Name: LEROY LARISCY MRN: [id number] DOB: 1926-09-21   Cancelled Treatment:    Reason Eval/Treat Not Completed: Patient's level of consciousness patient is currently in deep sleep. Will check back as schedule allows  Jackelyn Poling OTR/L, MS Acute Rehabilitation Department Office# [phone] Pager# [phone]    10/01/2020, 3:38 PM

## 2020-10-01 NOTE — Progress Notes (Signed)
PROGRESS NOTE    [name]  [id number] DOB: 1926/06/04 DOA: 09/30/2020 PCP: Lavone Orn, MD     Brief Narrative:  [name] is a 87 y.o. male with history of dementia, CAD s/p CABG in 2010, AS s/p AVR, HTN and BPH returning to ED after recurrent fall.   Patient was seen in ED yesterday after found down on the floor at independent living facility.  Work-up did not reveal any traumatic injuries.  He was found to have elevated troponin as well as elevated BNP.  Cardiology was consulted.  New events last 24 hours / Subjective: Patient without any complaints of chest pain or shortness of breath.  No nausea or vomiting.  Spoke with son Shanon Brow who resides out of state.  He states that patient fell 8/22 and was evaluated by ED at that time and was sent back to independent living facility.  Since then, patient has fallen twice.  Patient is currently main caregiver of his wife who is currently bedridden and has dementia.  Family has contemplated moving both parents to assisted living facility but has not done so yet.  Son states that patient had not recently complained of anything other than stiffness after falling but has been able to get around with a walker.  He had denied any recent illnesses, fevers, chills, cough, nausea, vomiting or any other new complaints other than physical soreness.  Son states that patient has another son who lives out of town, son-in-law Chip lives locally.  Patient is fairly certain that patient has a DNR and has given [place] permission to change CODE STATUS today.  Assessment & Plan:   Active Problems:   NSTEMI (non-ST elevated myocardial infarction) ([place])   Pressure injury of skin   NSTEMI, demand ischemia -Troponin 1908 --> 2850 -Appreciate cardiology -Echocardiogram pending.  If LV function normal then no further ischemic work-up planned  CHF, unspecified -BNP 2356  -Echocardiogram pending  Hypertension -Continue Norvasc   Recent fall -Fall  precaution -PT OT evaluation pending  Persistent leukocytosis -Since 09/10/2020 associated with thrombocytosis  -UA and urine culture negative -Blood culture pending  -?Sepsis no source of infection found. Stop cefepime/vanco and monitor  -Will refer to outpatient hematology on discharge   Dementia -Delirium precaution  CKD stage IIIb -Baseline creatinine 1.5 -Monitor   CAD -Continue aspirin, lipitor   Dysphagia -Appreciate SLP, MBS today -Continue dysphagia 1 diet   In agreement with assessment of the pressure ulcer as below:  Pressure Injury 09/30/20 Buttocks Left Stage 2 -  Partial thickness loss of dermis presenting as a shallow open injury with a red, pink wound bed without slough. wound red/purple dime sized (Active)  09/30/20 1758  Location: Buttocks  Location Orientation: Left  Staging: Stage 2 -  Partial thickness loss of dermis presenting as a shallow open injury with a red, pink wound bed without slough.  Wound Description (Comments): wound red/purple dime sized  Present on Admission: Yes     Pressure Injury 09/30/20 Buttocks Right Stage 2 -  Partial thickness loss of dermis presenting as a shallow open injury with a red, pink wound bed without slough. re/purple dime sized wound (Active)  09/30/20 1759  Location: Buttocks  Location Orientation: Right  Staging: Stage 2 -  Partial thickness loss of dermis presenting as a shallow open injury with a red, pink wound bed without slough.  Wound Description (Comments): re/purple dime sized wound  Present on Admission: Yes  DVT prophylaxis:  enoxaparin (LOVENOX) injection 30 mg Start: 09/30/20 2200  Code Status: DNR  Family Communication: Son over the phone Disposition Plan:  Status is: Inpatient  Remains inpatient appropriate because:Unsafe d/c plan and Inpatient level of care appropriate due to severity of illness  Dispo: The patient is from:  Independent living facility              Anticipated  d/c is to: SNF              Patient currently is not medically stable to d/c.   Difficult to place patient No      Consultants:  Cardiology   Antimicrobials:  Anti-infectives (From admission, onward)    Start     Dose/Rate Route Frequency Ordered Stop   10/02/20 1800  vancomycin (VANCOREADY) IVPB 1250 mg/250 mL        1,250 mg 166.7 mL/hr over 90 Minutes Intravenous Every 48 hours 09/30/20 1705     09/30/20 1800  ceFEPIme (MAXIPIME) 2 g in sodium chloride 0.9 % 100 mL IVPB        2 g 200 mL/hr over 30 Minutes Intravenous Every 24 hours 09/30/20 1659     09/30/20 1715  vancomycin (VANCOREADY) IVPB 1500 mg/300 mL        1,500 mg 150 mL/hr over 120 Minutes Intravenous  Once 09/30/20 1705 09/30/20 2021        Objective: Vitals:   09/30/20 2348 10/01/20 0534 10/01/20 0700 10/01/20 1155  BP: 137/69 (!) 158/67  (!) 142/62  Pulse: 74 76  69  Resp: '18 16 15 20  '$ Temp: 99 F (37.2 C) 98.1 F (36.7 C)  98 F (36.7 C)  TempSrc: Oral Oral  Oral  SpO2: 97% 95% 96% 95%  Weight:        Intake/Output Summary (Last 24 hours) at 10/01/2020 1355 Last data filed at 10/01/2020 1000 Gross per 24 hour  Intake --  Output 2050 ml  Net -2050 ml   Filed Weights   09/30/20 1721  Weight: 66.9 kg    Examination:  General exam: Appears calm and comfortable  Respiratory system: Clear to auscultation. Respiratory effort normal. No respiratory distress. No conversational dyspnea.  Cardiovascular system: S1 & S2 heard, RRR. No pedal edema. Gastrointestinal system: Abdomen is nondistended, soft and nontender. Normal bowel sounds heard. Central nervous system: Alert, nonfocal exam, hard of hearing Extremities: Symmetric in appearance  Skin: No rashes, lesions or ulcers on exposed skin  Psychiatry: Stable  Data Reviewed: I have personally reviewed following labs and imaging studies  CBC: Recent Labs  Lab 09/29/20 1740 09/30/20 1141 10/01/20 0422  WBC 32.8* 43.6* 31.1*  NEUTROABS  26.5* 36.9*  --   HGB 12.2* 12.8* 11.2*  HCT 38.5* 38.6* 33.3*  MCV 91.9 89.8 89.3  PLT 745* 756* [id number]*   Basic Metabolic Panel: Recent Labs  Lab 09/29/20 1740 09/30/20 1141 10/01/20 0422  NA 139 143 139  K 4.6 4.5 3.9  CL 108 110 107  CO2 21* 23 24  GLUCOSE 106* 125* 112*  BUN 34* 38* 38*  CREATININE 1.63* 1.78* 1.78*  CALCIUM 9.0 9.4 8.5*   GFR: Estimated Creatinine Clearance: 25.6 mL/min (A) (by C-G formula based on SCr of 1.78 mg/dL (H)). Liver Function Tests: Recent Labs  Lab 09/30/20 1141  AST 56*  ALT 20  ALKPHOS 89  BILITOT 1.5*  PROT 7.9  ALBUMIN 4.0   No results for input(s): LIPASE, AMYLASE in the last 168 hours. No results  for input(s): AMMONIA in the last 168 hours. Coagulation Profile: No results for input(s): INR, PROTIME in the last 168 hours. Cardiac Enzymes: Recent Labs  Lab 09/29/20 1740  CKTOTAL 102   BNP (last 3 results) No results for input(s): PROBNP in the last 8760 hours. HbA1C: No results for input(s): HGBA1C in the last 72 hours. CBG: No results for input(s): GLUCAP in the last 168 hours. Lipid Profile: No results for input(s): CHOL, HDL, LDLCALC, TRIG, CHOLHDL, LDLDIRECT in the last 72 hours. Thyroid Function Tests: No results for input(s): TSH, T4TOTAL, FREET4, T3FREE, THYROIDAB in the last 72 hours. Anemia Panel: No results for input(s): VITAMINB12, FOLATE, FERRITIN, TIBC, IRON, RETICCTPCT in the last 72 hours. Sepsis Labs: Recent Labs  Lab 09/30/20 1141 09/30/20 1833 09/30/20 2128 10/01/20 0422  PROCALCITON 0.23  --   --  0.20  LATICACIDVEN  --  1.7 1.4  --     Recent Results (from the past 240 hour(s))  Resp Panel by RT-PCR (Flu A&B, Covid) Nasopharyngeal Swab     Status: None   Collection Time: 09/30/20 11:41 AM   Specimen: Nasopharyngeal Swab; Nasopharyngeal(NP) swabs in vial transport medium  Result Value Ref Range Status   SARS Coronavirus 2 by RT PCR NEGATIVE NEGATIVE Final    Comment: (NOTE) SARS-CoV-2  target nucleic acids are NOT DETECTED.  The SARS-CoV-2 RNA is generally detectable in upper respiratory specimens during the acute phase of infection. The lowest concentration of SARS-CoV-2 viral copies this assay can detect is 138 copies/mL. A negative result does not preclude SARS-Cov-2 infection and should not be used as the sole basis for treatment or other patient management decisions. A negative result may occur with  improper specimen collection/handling, submission of specimen other than nasopharyngeal swab, presence of viral mutation(s) within the areas targeted by this assay, and inadequate number of viral copies(<138 copies/mL). A negative result must be combined with clinical observations, patient history, and epidemiological information. The expected result is Negative.  Fact Sheet for Patients:  EntrepreneurPulse.com.au  Fact Sheet for Healthcare Providers:  IncredibleEmployment.be  This test is no t yet approved or cleared by the [place] FDA and  has been authorized for detection and/or diagnosis of SARS-CoV-2 by FDA under an Emergency Use Authorization (EUA). This EUA will remain  in effect (meaning this test can be used) for the duration of the COVID-19 declaration under Section 564(b)(1) of the Act, 21 U.S.C.section 360bbb-3(b)(1), unless the authorization is terminated  or revoked sooner.       Influenza A by PCR NEGATIVE NEGATIVE Final   Influenza B by PCR NEGATIVE NEGATIVE Final    Comment: (NOTE) The Xpert Xpress SARS-CoV-2/FLU/RSV plus assay is intended as an aid in the diagnosis of influenza from Nasopharyngeal swab specimens and should not be used as a sole basis for treatment. Nasal washings and aspirates are unacceptable for Xpert Xpress SARS-CoV-2/FLU/RSV testing.  Fact Sheet for Patients: EntrepreneurPulse.com.au  Fact Sheet for Healthcare  Providers: IncredibleEmployment.be  This test is not yet approved or cleared by the [place] FDA and has been authorized for detection and/or diagnosis of SARS-CoV-2 by FDA under an Emergency Use Authorization (EUA). This EUA will remain in effect (meaning this test can be used) for the duration of the COVID-19 declaration under Section 564(b)(1) of the Act, 21 U.S.C. section 360bbb-3(b)(1), unless the authorization is terminated or revoked.  Performed at [facility], [place] [address] [place], [place] [address]   Urine Culture     Status: None  Collection Time: 09/30/20 11:41 AM   Specimen: Urine, Clean Catch  Result Value Ref Range Status   Specimen Description   Final    URINE, CLEAN CATCH Performed at [facility], [place] [address], [place], [place] [address]    Special Requests   Final    NONE Performed at [facility], [place] [address] [place], [place] [address]    Culture   Final    NO GROWTH Performed at [facility], [place] [address], [place], [place] [address]    Report Status 10/01/2020 FINAL  Final      Radiology Studies: DG Chest 2 View  Result Date: 09/29/2020 CLINICAL DATA:  Fall EXAM: CHEST - 2 VIEW COMPARISON:  09/10/2020, 04/08/2015 FINDINGS: Post sternotomy changes. Cardiomegaly with aortic atherosclerosis. Chronic pleural and parenchymal scarring at the left base. Mild interstitial prominence could be due to edema. Aortic valve prosthesis. No visible pneumothorax. IMPRESSION: 1. Cardiomegaly and mild diffuse interstitial opacity which could be due to mild edema 2. Chronic pleural and parenchymal scarring at left lung base. Electronically Signed   By: Donavan Foil M.D.   On: 09/29/2020 17:20   DG Lumbar Spine Complete  Result Date: 09/29/2020 CLINICAL DATA:  [place], back pain EXAM: LUMBAR SPINE - COMPLETE 4+ VIEW COMPARISON:  None. FINDINGS: Frontal, bilateral oblique, lateral  views of the lumbar spine are obtained. There are 5 non-rib-bearing lumbar type vertebral bodies in normal anatomic alignment. No acute displaced fractures. There is prominent spondylosis at the thoracolumbar junction with bridging anterior osteophytes. Significant facet hypertrophy is seen within the lower lumbar spine greatest at L3-4, L4-5, and L5-S1. Sacroiliac joints are unremarkable. Visualized portions of the bony pelvis are normal. IMPRESSION: 1. Multilevel thoracolumbar spondylosis and facet hypertrophy. No acute bony abnormality. Electronically Signed   By: Randa Ngo M.D.   On: 09/29/2020 17:21   DG Pelvis 1-2 Views  Result Date: 09/29/2020 CLINICAL DATA:  Fall EXAM: PELVIS - 1-2 VIEW COMPARISON:  09/10/2020 FINDINGS: SI joints show mild degenerative change but are non widened. Pubic symphysis and rami appear intact. No definitive fracture or malalignment. An oblique lucency over the left femoral neck appears to represent a skin fold artifact. Moderate bilateral hip arthritis IMPRESSION: 1. No definite acute osseous abnormality. A lucency over the left femoral neck is suspected to be due to fold artifact 2. Cross-sectional imaging should be pursued if high clinical suspicion for hip fracture Electronically Signed   By: Donavan Foil M.D.   On: 09/29/2020 17:37   DG Elbow Complete Left  Result Date: 09/30/2020 CLINICAL DATA:  Fall in a 87 year old male with bilateral skin tears to the elbows. EXAM: LEFT ELBOW - COMPLETE 3+ VIEW COMPARISON:  Contralateral elbow FINDINGS: Small well corticated bone fragments adjacent to the lateral epicondyle. No acute fracture or dislocation. No substantial soft tissue swelling or sign of joint effusion. IMPRESSION: No acute fracture or traumatic malalignment. Small well corticated bone fragments adjacent to the lateral epicondyle may reflect remote injury or sequela of prior epicondylitis. Electronically Signed   By: Zetta Bills M.D.   On: 09/30/2020 12:08    DG Elbow Complete Right  Result Date: 09/30/2020 CLINICAL DATA:  Right elbow pain and skin tears following a fall. EXAM: RIGHT ELBOW - COMPLETE 3+ VIEW COMPARISON:  09/29/2020 FINDINGS: Decreased posterior elbow swelling. No fracture, dislocation or effusion. IMPRESSION: No fracture or dislocation. Electronically Signed   By: Claudie Revering M.D.   On: 09/30/2020 12:12   DG  Elbow Complete Right  Result Date: 09/29/2020 CLINICAL DATA:  Fall with laceration EXAM: RIGHT ELBOW - COMPLETE 3+ VIEW COMPARISON:  None. FINDINGS: There is no evidence of fracture, or dislocation. Slightly limited lateral view due to nonstandard positioning. Posterior elbow swelling. No gross effusion IMPRESSION: No definite acute osseous abnormality Electronically Signed   By: Donavan Foil M.D.   On: 09/29/2020 17:21   CT HEAD WO CONTRAST  Result Date: 09/30/2020 CLINICAL DATA:  Head trauma. EXAM: CT HEAD WITHOUT CONTRAST TECHNIQUE: Contiguous axial images were obtained from the base of the skull through the vertex without intravenous contrast. COMPARISON:  September 29, 2020 FINDINGS: Brain: No evidence of acute infarction, hemorrhage, hydrocephalus, extra-axial collection or mass lesion/mass effect. Moderate brain parenchymal volume loss and deep white matter microangiopathy. Vascular: No hyperdense vessel or unexpected calcification. Skull: Normal. Negative for fracture or focal lesion. Sinuses/Orbits: No acute finding. Other: None. IMPRESSION: 1. No acute intracranial abnormality. 2. Moderate brain parenchymal atrophy and chronic microvascular disease. Electronically Signed   By: Fidela Salisbury M.D.   On: 09/30/2020 14:21   CT HEAD WO CONTRAST (5MM)  Result Date: 09/29/2020 CLINICAL DATA:  A 87 year old male patient found on the floor, found down. EXAM: CT HEAD WITHOUT CONTRAST CT CERVICAL SPINE WITHOUT CONTRAST TECHNIQUE: Multidetector CT imaging of the head and cervical spine was performed following the standard  protocol without intravenous contrast. Multiplanar CT image reconstructions of the cervical spine were also generated. COMPARISON:  Jun 15, 2015. FINDINGS: CT HEAD FINDINGS Brain: No evidence of acute infarction, hemorrhage, hydrocephalus, extra-axial collection or mass lesion/mass effect. Signs of marked atrophy and chronic microvascular ischemic change with worsening of atrophy since previous imaging. Vascular: No hyperdense vessel or unexpected calcification. Skull: Normal. Negative for fracture or focal lesion. Sinuses/Orbits: No acute finding. Other: None CT CERVICAL SPINE FINDINGS Alignment: Straightening of normal cervical lordotic curvature in the setting of marked spinal degenerative change. Skull base and vertebrae: No acute fracture. No primary bone lesion or focal pathologic process. Soft tissues and spinal canal: No prevertebral fluid or swelling. No visible canal hematoma. Disc levels: Multilevel degenerative changes with marked atlantoaxial degenerative changes. Marked multilevel facet arthropathy on the RIGHT at C2-3, C3-4, C4-5 and to a lesser extent C5-6. Marked disc space narrowing at C5-6 with anterior osteophytes and uncovertebral spurring. Facet arthropathy on the LEFT at this level and the level below. Upper chest: Negative. Other: None IMPRESSION: No acute intracranial abnormality. Signs of marked atrophy and chronic microvascular ischemic change with worsening of atrophy since previous imaging. No evidence for acute fracture or static subluxation of the cervical spine. Marked multilevel degenerative changes and facet arthropathy of the cervical spine. Electronically Signed   By: Zetta Bills M.D.   On: 09/29/2020 16:20   CT CHEST WO CONTRAST  Result Date: 09/30/2020 CLINICAL DATA:  Fall EXAM: CT CHEST WITHOUT CONTRAST TECHNIQUE: Multidetector CT imaging of the chest was performed following the standard protocol without IV contrast. COMPARISON:  Prior CT scan of the chest 04/30/2015  FINDINGS: Cardiovascular: Limited evaluation in the absence of intravenous contrast. No evidence of aortic aneurysm. Scattered atherosclerotic plaque throughout the abdominal aorta. Mild cardiomegaly. Extensive coronary artery atherosclerotic calcifications. Surgical changes of prior aortic valve replacement. No pericardial effusion. Mediastinum/Nodes: Unremarkable CT appearance of the thyroid gland. No suspicious mediastinal or hilar adenopathy. No soft tissue mediastinal mass. The thoracic esophagus is unremarkable. Lungs/Pleura: Small left and moderate right layering pleural effusions. Diffuse chronic bronchial wall thickening. Atelectasis present in both lower lobes. Mild  interlobular septal thickening in the apices and bases. No suspicious pulmonary mass or nodule. Upper Abdomen: No acute abnormality. Musculoskeletal: No chest wall mass or suspicious bone lesions identified. Mild dextroconvex scoliosis. IMPRESSION: 1. No evidence of acute injury to the thorax. 2. Cardiomegaly with extensive coronary artery calcifications and surgical changes of prior aortic valve replacement. 3. Moderate right and small left layering pleural effusions. Combined with trace interstitial pulmonary edema, findings suggest mild or chronic CHF. 4. Diffuse chronic bronchial wall thickening bilaterally. Aortic Atherosclerosis (ICD10-I70.0). Electronically Signed   By: Jacqulynn Cadet M.D.   On: 09/30/2020 14:31   CT CERVICAL SPINE WO CONTRAST  Result Date: 09/30/2020 CLINICAL DATA:  Post fall.  Neck trauma. EXAM: CT CERVICAL SPINE WITHOUT CONTRAST TECHNIQUE: Multidetector CT imaging of the cervical spine was performed without intravenous contrast. Multiplanar CT image reconstructions were also generated. COMPARISON:  September 29, 2020 FINDINGS: Alignment: Normal. Skull base and vertebrae: No acute fracture. No primary bone lesion or focal pathologic process. Soft tissues and spinal canal: No prevertebral fluid or swelling. No  visible canal hematoma. Disc levels:  Multilevel osteoarthritic changes. Upper chest: Negative. Other: None. IMPRESSION: 1. No evidence of acute traumatic injury to the cervical spine. 2. Multilevel osteoarthritic changes of the cervical spine. Electronically Signed   By: Fidela Salisbury M.D.   On: 09/30/2020 14:26   CT Cervical Spine Wo Contrast  Result Date: 09/29/2020 CLINICAL DATA:  A 87 year old male patient found on the floor, found down. EXAM: CT HEAD WITHOUT CONTRAST CT CERVICAL SPINE WITHOUT CONTRAST TECHNIQUE: Multidetector CT imaging of the head and cervical spine was performed following the standard protocol without intravenous contrast. Multiplanar CT image reconstructions of the cervical spine were also generated. COMPARISON:  Jun 15, 2015. FINDINGS: CT HEAD FINDINGS Brain: No evidence of acute infarction, hemorrhage, hydrocephalus, extra-axial collection or mass lesion/mass effect. Signs of marked atrophy and chronic microvascular ischemic change with worsening of atrophy since previous imaging. Vascular: No hyperdense vessel or unexpected calcification. Skull: Normal. Negative for fracture or focal lesion. Sinuses/Orbits: No acute finding. Other: None CT CERVICAL SPINE FINDINGS Alignment: Straightening of normal cervical lordotic curvature in the setting of marked spinal degenerative change. Skull base and vertebrae: No acute fracture. No primary bone lesion or focal pathologic process. Soft tissues and spinal canal: No prevertebral fluid or swelling. No visible canal hematoma. Disc levels: Multilevel degenerative changes with marked atlantoaxial degenerative changes. Marked multilevel facet arthropathy on the RIGHT at C2-3, C3-4, C4-5 and to a lesser extent C5-6. Marked disc space narrowing at C5-6 with anterior osteophytes and uncovertebral spurring. Facet arthropathy on the LEFT at this level and the level below. Upper chest: Negative. Other: None IMPRESSION: No acute intracranial  abnormality. Signs of marked atrophy and chronic microvascular ischemic change with worsening of atrophy since previous imaging. No evidence for acute fracture or static subluxation of the cervical spine. Marked multilevel degenerative changes and facet arthropathy of the cervical spine. Electronically Signed   By: Zetta Bills M.D.   On: 09/29/2020 16:20   CT ABDOMEN PELVIS W CONTRAST  Result Date: 09/30/2020 CLINICAL DATA:  Fall EXAM: CT ABDOMEN AND PELVIS WITH CONTRAST TECHNIQUE: Multidetector CT imaging of the abdomen and pelvis was performed using the standard protocol following bolus administration of intravenous contrast. CONTRAST:  81m OMNIPAQUE IOHEXOL 350 MG/ML SOLN COMPARISON:  None. FINDINGS: Lower chest: See concurrently obtained but separately dictated CT scan of the chest. Hepatobiliary: No focal liver abnormality is seen. No gallstones, gallbladder wall thickening, or biliary dilatation.  Pancreas: Unremarkable. No pancreatic ductal dilatation or surrounding inflammatory changes. Spleen: Normal in size without focal abnormality. Adrenals/Urinary Tract: Unremarkable adrenal glands. Renal cortical atrophy bilaterally. Nonobstructing bilateral nephrolithiasis. No enhancing renal mass. No ureteral abnormalities. The bladder is within normal limits. Stomach/Bowel: Scattered colonic diverticula without evidence of active inflammation. Normal appendix. No focal bowel wall thickening or evidence of obstruction. Vascular/Lymphatic: Atherosclerotic calcifications throughout the abdominal aorta without evidence of aneurysm. No suspicious lymphadenopathy. Reproductive: Prostate is unremarkable. Other: No abdominal wall hernia or abnormality. No abdominopelvic ascites. Musculoskeletal: No acute or significant osseous findings. Bilateral lower lumbar facet arthropathy. IMPRESSION: 1. No acute abnormality or evidence of injury in the abdomen or pelvis. 2. Bilateral nonobstructing nephrolithiasis. 3.  Aortic  Atherosclerosis (ICD10-I70.0). 4. Lower lumbar facet arthropathy. 5. Scattered colonic diverticula without evidence of active inflammation. Electronically Signed   By: Jacqulynn Cadet M.D.   On: 09/30/2020 14:25   CT L-SPINE NO CHARGE  Result Date: 09/30/2020 CLINICAL DATA:  Fall W19.XXXA (ICD-10-CM) EXAM: CT LUMBAR SPINE WITHOUT CONTRAST TECHNIQUE: Multidetector CT imaging of the lumbar spine was performed without intravenous contrast administration. Multiplanar CT image reconstructions were also generated. COMPARISON:  None. FINDINGS: Segmentation: 5 non rib-bearing lumbar vertebral bodies. Alignment: No substantial sagittal subluxation. Mild broad levocurvature. Vertebrae: Vertebral body heights are maintained. No evidence of acute fracture. Diffuse osteopenia. Paraspinal and other soft tissues: Please see concurrent CT abdomen/pelvis for intra-abdominal evaluation. Disc levels: Broad disc bulge at L4-L5 with at least mild canal stenosis. Severe lower lumbar facet arthropathy. Probable foraminal stenosis on the left at L4-L5 and L5-S1. IMPRESSION: 1. No evidence of acute fracture or traumatic malalignment. 2. Severe lower lumbar facet arthropathy with likely at least mild canal stenosis at L4-L5 and probable left foraminal stenosis at L4-L5 and L5-S1. An MRI of the lumbar spine could better characterize the canal and foramina if clinically indicated. 3. Osteopenia. Electronically Signed   By: Margaretha Sheffield M.D.   On: 09/30/2020 13:58   DG Chest Port 1 View  Result Date: 09/30/2020 CLINICAL DATA:  Unwitnessed fall. EXAM: PORTABLE CHEST 1 VIEW COMPARISON:  09/29/2020 FINDINGS: Stable mildly enlarged cardiac silhouette, sternal wires and prosthetic aortic valve. Interval patchy opacity in both lungs, most pronounced in the lower lung zones. No visible pleural fluid. No fracture or pneumothorax seen. Thoracic spine degenerative changes. IMPRESSION: Interval patchy bilateral atelectasis or pneumonia.  Pulmonary contusions are also a possibility. Electronically Signed   By: Claudie Revering M.D.   On: 09/30/2020 12:09   DG Swallowing Func-Speech Pathology  Result Date: 10/01/2020 Table formatting from the original result was not included. Objective Swallowing Evaluation: Type of Study: MBS-Modified Barium Swallow Study  Patient Details Name: [name] MRN: [id number] Date of Birth: 09-Oct-1926 Today's Date: 10/01/2020 Time: SLP Start Time (ACUTE ONLY): [id number] -SLP Stop Time (ACUTE ONLY): [id number] SLP Time Calculation (min) (ACUTE ONLY): 20 min Past Medical History: Past Medical History: Diagnosis Date  BPH (benign prostatic hyperplasia)   Coronary artery disease with hx of myocardial infarct w/o hx of CABG 2010  Dehydration   Diverticulosis   HOH (hard of hearing)   hearing aids  Hyperlipidemia   Hypertension   Impaired fasting glucose   Memory difficulty 10/19/2012  Memory loss   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 male with PMH: dementia, CAD s/p CABG in 2010, AS s/p AVR, HTN and BPH returning to ED after recurrent fall. In ED, vitals WNL except slightly elevated BP, CXR revealed atelectasis/possible pneumonia/possible contusion, CT head and spine without acute finding, CTA chest showed cardiomegaly, moderate right and small left pleural effusion.  Subjective: pleasant, cooperative Assessment / Plan / Recommendation CHL IP CLINICAL IMPRESSIONS 10/01/2020 Clinical Impression Patient presents with a mild-moderate oropharyngeal dysphagia with a suspected cervical esophageal component as well. Patient exhibited oral delays with all tested boluses and when taking straw sips of thin liquids or nectar thick liquids, would appear to fill up oral  cavity and hold liquid until fairly full prior to swallow. Premature spillage from oral cavity into vallecular sinus was observed with thin liquids. Patient exhibited piecemeal swallowing with puree solids. During pharyngeal phase, patient exhibited trace penetration (PAS 3) during the swallow with thin liquids via straw sips but no aspiration. He did exhibit aspiration of trace amount (PAS 8) during and after the swallow when consuming thin liquids via cup sips. SLP suspects patients awareness to liquids during cup sips was poor leading to aspiration, however improved when using straw sips. No penetration or aspiration observed with straw sips of nectar thick liquids. Puree solids resulted in trace to mild vallecular and pyriform sinus residuals as well as reduced pharyngeal transit. Trace residuals observed in pharynx in vallecular and pyriform sinuses with thin liquids and nectar thick liquids. Patient exhibited reduced UES relaxation with retention of barium just above UES observed. In addition, SLP observed presence of likely cervical osteophyte (no radiologist present to confirm). SLP is recommending Dys 1, nectar thick liquids but allow thin liquids in between meals with straw only (no cup sips). SLP Visit Diagnosis -- Attention and concentration deficit following -- Frontal lobe and executive function deficit following -- Impact on safety and function --   CHL IP TREATMENT RECOMMENDATION 10/01/2020 Treatment Recommendations Therapy as outlined in treatment plan below   Prognosis 10/01/2020 Prognosis for Safe Diet Advancement Fair Barriers to Reach Goals Cognitive deficits Barriers/Prognosis Comment patient with h/o dementia CHL IP DIET RECOMMENDATION 10/01/2020 SLP Diet Recommendations Dysphagia 1 (Puree) solids;Nectar thick liquid;Other (Comment) Liquid Administration via Straw;Other (Comment) Medication Administration Crushed with puree Compensations Minimize environmental distractions;Small sips/bites;Slow  rate Postural Changes --   CHL IP OTHER RECOMMENDATIONS 10/01/2020 Recommended Consults -- Oral Care Recommendations Oral care BID;Staff/trained caregiver to provide oral care Other Recommendations Order thickener from pharmacy;Prohibited food (jello, ice cream, thin soups);Remove water pitcher;Clarify dietary restrictions   CHL IP FOLLOW UP RECOMMENDATIONS 10/01/2020 Follow up Recommendations 24 hour supervision/assistance;Skilled Nursing facility   [facility] IP FREQUENCY AND DURATION 10/01/2020 Speech Therapy Frequency (ACUTE ONLY) min 1 x/week Treatment Duration 1 week      CHL IP ORAL PHASE 10/01/2020 Oral Phase Impaired Oral - Pudding Teaspoon -- Oral - Pudding Cup -- Oral - Honey Teaspoon -- Oral - Honey Cup -- Oral - Nectar Teaspoon -- Oral - Nectar Cup -- Oral - Nectar Straw Weak lingual manipulation;Delayed oral transit;Piecemeal swallowing;Reduced posterior propulsion Oral - Thin Teaspoon -- Oral - Thin Cup Weak lingual manipulation;Holding of bolus;Piecemeal swallowing;Delayed oral transit;Reduced posterior propulsion;Premature spillage Oral - Thin Straw Reduced posterior propulsion;Delayed oral transit;Weak lingual manipulation;Holding of bolus;Premature spillage Oral - Puree Weak lingual manipulation;Reduced posterior propulsion;Delayed oral transit;Piecemeal swallowing Oral - Mech Soft -- Oral - Regular -- Oral - Multi-Consistency -- Oral -  Pill Weak lingual manipulation;Reduced posterior propulsion;Delayed oral transit Oral Phase - Comment --  CHL IP PHARYNGEAL PHASE 10/01/2020 Pharyngeal Phase Impaired Pharyngeal- Pudding Teaspoon -- Pharyngeal -- Pharyngeal- Pudding Cup -- Pharyngeal -- Pharyngeal- Honey Teaspoon -- Pharyngeal -- Pharyngeal- Honey Cup -- Pharyngeal -- Pharyngeal- Nectar Teaspoon -- Pharyngeal -- Pharyngeal- Nectar Cup -- Pharyngeal -- Pharyngeal- Nectar Straw Pharyngeal residue - valleculae;Pharyngeal residue - pyriform;Reduced anterior laryngeal mobility Pharyngeal -- Pharyngeal- Thin  Teaspoon -- Pharyngeal -- Pharyngeal- Thin Cup Reduced anterior laryngeal mobility;Penetration/Aspiration during swallow;Penetration/Apiration after swallow;Trace aspiration;Pharyngeal residue - valleculae;Pharyngeal residue - pyriform Pharyngeal Material enters airway, passes BELOW cords without attempt by patient to eject out (silent aspiration);Material enters airway, CONTACTS cords and not ejected out Pharyngeal- Thin Straw Delayed swallow initiation-vallecula;Penetration/Aspiration during swallow;Pharyngeal residue - valleculae;Pharyngeal residue - pyriform;Reduced anterior laryngeal mobility;Reduced airway/laryngeal closure Pharyngeal Material enters airway, remains ABOVE vocal cords and not ejected out Pharyngeal- Puree Delayed swallow initiation-vallecula;Pharyngeal residue - pyriform;Pharyngeal residue - valleculae;Reduced anterior laryngeal mobility;Reduced pharyngeal peristalsis Pharyngeal -- Pharyngeal- Mechanical Soft -- Pharyngeal -- Pharyngeal- Regular -- Pharyngeal -- Pharyngeal- Multi-consistency -- Pharyngeal -- Pharyngeal- Pill Delayed swallow initiation-vallecula;Pharyngeal residue - pyriform;Pharyngeal residue - valleculae;Reduced pharyngeal peristalsis Pharyngeal -- Pharyngeal Comment --  CHL IP CERVICAL ESOPHAGEAL PHASE 10/01/2020 Cervical Esophageal Phase Impaired Pudding Teaspoon -- Pudding Cup -- Honey Teaspoon -- Honey Cup -- Nectar Teaspoon -- Nectar Cup -- Nectar Straw Reduced cricopharyngeal relaxation Thin Teaspoon -- Thin Cup Reduced cricopharyngeal relaxation Thin Straw Reduced cricopharyngeal relaxation Puree Reduced cricopharyngeal relaxation Mechanical Soft -- Regular -- Multi-consistency -- Pill Reduced cricopharyngeal relaxation Cervical Esophageal Comment -- Sonia Baller, MA, CCC-SLP Speech Therapy             CT Angio Chest/Abd/Pel for Dissection W and/or Wo Contrast  Result Date: 09/30/2020 CLINICAL DATA:  Abdominal pain. Elevated troponin. Clinical concern for aortic  dissection. EXAM: CT ANGIOGRAPHY CHEST, ABDOMEN AND PELVIS TECHNIQUE: Non-contrast CT of the chest was initially obtained. Multidetector CT imaging through the chest, abdomen and pelvis was performed using the standard protocol during bolus administration of intravenous contrast. Multiplanar reconstructed images and MIPs were obtained and reviewed to evaluate the vascular anatomy. CONTRAST:  45m OMNIPAQUE IOHEXOL 350 MG/ML SOLN COMPARISON:  Chest CT obtained without contrast earlier today and abdomen and pelvis CT obtained with contrast earlier today. FINDINGS: The images are limited by photon starvation and streak artifacts produced by the patient's arms not being raised above his head. CTA CHEST FINDINGS Cardiovascular: Median sternotomy wires, post CABG changes and prosthetic aortic valve. Aortic and coronary artery calcifications. No aortic aneurysm or dissection. Mildly enlarged heart with left ventricular and biatrial enlargement. Mediastinum/Nodes: No enlarged mediastinal, hilar, or axillary lymph nodes. Thyroid gland, trachea, and esophagus demonstrate no significant findings. Lungs/Pleura: Stable moderate right and small left pleural effusions, prominent pulmonary vasculature, mildly prominent interstitial markings, diffuse peribronchial thickening and mild bilateral dependent atelectasis. Musculoskeletal: Thoracic and lower cervical spine degenerative changes, including changes of DISH. Review of the MIP images confirms the above findings. CTA ABDOMEN AND PELVIS FINDINGS VASCULAR Aorta: Extensive calcified and noncalcified mural plaque formation in the abdominal aorta, most pronounced in the infrarenal region. In that area, this is causing approximately 50% luminal narrowing. No aneurysm or acute dissection seen. Celiac: Calcified and noncalcified plaque at the origin producing greater than 90% stenosis. Normally opacified distally. SMA: Calcified plaque formation at the origin and proximally, producing  approximately 50% stenosis. Renals: Dense calcified plaque formation at the origins causing approximately 90% or greater stenosis bilaterally. IMA: Noncalcified plaque  at the origin producing approximately 90% stenosis. Normally opacified distally. Inflow: Bilateral common iliac calcified plaque formation causing about 50% luminal stenosis on the left and 20% luminal stenosis on the right. There is also mild calcified plaque formation involving the internal iliac arteries bilaterally. The external iliac arteries are unremarkable. There is some calcified plaque involving both common femoral arteries without significant luminal stenosis. Veins: No obvious venous abnormality within the limitations of this arterial phase study. Review of the MIP images confirms the above findings. NON-VASCULAR Hepatobiliary: No focal liver abnormality is seen. No gallstones, gallbladder wall thickening, or biliary dilatation. Pancreas: Unremarkable. No pancreatic ductal dilatation or surrounding inflammatory changes. Spleen: Mildly prominent without abnormal enlargement. Adrenals/Urinary Tract: Normal appearing adrenal glands. Small bilateral renal calculi. Excreted contrast in the urinary bladder. Unremarkable ureters. Stomach/Bowel: Multiple colonic diverticula without evidence of diverticulitis. Normal appearing appendix. Unremarkable small bowel and stomach. Lymphatic: No enlarged lymph nodes. Reproductive: Moderately enlarged prostate gland. Other: Small to moderate-sized left inguinal hernia containing fat and small right inguinal hernia containing fat. Musculoskeletal: Lumbar spine degenerative changes. Review of the MIP images confirms the above findings. IMPRESSION: 1. No aortic aneurysm or dissection. 2. Extensive arterial atheromatous changes causing approximately 50% luminal stenosis of the infrarenal abdominal aorta, greater than 90% stenosis of the proximal celiac axis, approximately 50% stenosis of the proximal SMA,  approximately 90% or greater stenosis of the renal arteries bilaterally and approximately 90% stenosis of the proximal IMA. 3. Stable changes of congestive heart failure including bilateral pleural effusions, interstitial pulmonary edema, pulmonary vascular congestion and bronchitic changes. 4. Colonic diverticulosis. 5. Bilateral nonobstructing renal calculi. Electronically Signed   By: Claudie Revering M.D.   On: 09/30/2020 17:36   DG Hip Unilat W or Wo Pelvis 2-3 Views Left  Result Date: 09/29/2020 CLINICAL DATA:  Fall EXAM: DG HIP (WITH OR WITHOUT PELVIS) 2-3V LEFT COMPARISON:  09/10/2020 FINDINGS: Left SI joint is non widened. No definitive fracture or malalignment. Moderate hip arthritis. IMPRESSION: No definite acute osseous abnormality. Electronically Signed   By: Donavan Foil M.D.   On: 09/29/2020 19:04      Scheduled Meds:  aspirin EC  81 mg Oral Daily   atorvastatin  20 mg Oral Daily   enoxaparin (LOVENOX) injection  30 mg Subcutaneous Q24H   Continuous Infusions:  ceFEPime (MAXIPIME) IV 2 g (09/30/20 2127)   [START ON 10/02/2020] vancomycin       LOS: 1 day      Time spent: 45 minutes   Dessa Phi, DO [facility] 10/01/2020, 1:55 PM   Available via Epic secure chat 7am-7pm After these hours, please refer to coverage provider listed on [email or web address]

## 2020-10-01 NOTE — Progress Notes (Signed)
  Echocardiogram 2D Echocardiogram has been performed.  [name] M 10/01/2020, 1:29 PM

## 2020-10-01 NOTE — Progress Notes (Signed)
Modified Barium Swallow Progress Note  Patient Details  Name: [name] MRN: [id number] Date of Birth: 17-Jan-1927  Today's Date: 10/01/2020  Modified Barium Swallow completed.  Full report located under Chart Review in the Imaging Section.  Brief recommendations include the following:  Clinical Impression  Patient presents with a mild-moderate oropharyngeal dysphagia with a suspected cervical esophageal component as well. Patient exhibited oral delays with all tested boluses and when taking straw sips of thin liquids or nectar thick liquids, would appear to fill up oral cavity and hold liquid until fairly full prior to swallow. Premature spillage from oral cavity into vallecular sinus was observed with thin liquids. Patient exhibited piecemeal swallowing with puree solids. During pharyngeal phase, patient exhibited trace penetration (PAS 3) during the swallow with thin liquids via straw sips but no aspiration. He did exhibit aspiration of trace amount (PAS 8) during and after the swallow when consuming thin liquids via cup sips. SLP suspects patients awareness to liquids during cup sips was poor leading to aspiration, however improved when using straw sips. No penetration or aspiration observed with straw sips of nectar thick liquids. Puree solids resulted in trace to mild vallecular and pyriform sinus residuals as well as reduced pharyngeal transit. Trace residuals observed in pharynx in vallecular and pyriform sinuses with thin liquids and nectar thick liquids. Patient exhibited reduced UES relaxation with retention of barium just above UES observed. In addition, SLP observed presence of likely cervical osteophyte (no radiologist present to confirm, however consistent with 9/10 CT cervical spine). SLP is recommending Dys 1, nectar thick liquids but allow thin liquids in between meals with straw only (no cup sips).   Swallow Evaluation Recommendations       SLP Diet Recommendations:  Dysphagia 1 (Puree) solids;Nectar thick liquid;Other (Comment) (may have thin liquids in between meals WITH STRAW only)   Liquid Administration via: Straw;Other (Comment) (no cups  with thin liquids)   Medication Administration: Crushed with puree   Supervision: Full assist for feeding;Staff to assist with self feeding   Compensations: Minimize environmental distractions;Small sips/bites;Slow rate       Oral Care Recommendations: Oral care BID;Staff/trained caregiver to provide oral care   Other Recommendations: Order thickener from pharmacy;Prohibited food (jello, ice cream, thin soups);Remove water pitcher;Clarify dietary restrictions    Sonia Baller, MA, CCC-SLP Speech Therapy

## 2020-10-02 LAB — BASIC METABOLIC PANEL
Anion gap: 8 (ref 5–15)
BUN: 36 mg/dL — ABNORMAL HIGH (ref 8–23)
CO2: 24 mmol/L (ref 22–32)
Calcium: 8.5 mg/dL — ABNORMAL LOW (ref 8.9–10.3)
Chloride: 107 mmol/L (ref 98–111)
Creatinine, Ser: 1.68 mg/dL — ABNORMAL HIGH (ref 0.61–1.24)
GFR, Estimated: 38 mL/min — ABNORMAL LOW (ref >60)
Glucose, Bld: 102 mg/dL — ABNORMAL HIGH (ref 70–99)
Potassium: 3.5 mmol/L (ref 3.5–5.1)
Sodium: 139 mmol/L (ref 135–145)

## 2020-10-02 LAB — CBC
HCT: 32.4 % — ABNORMAL LOW (ref 39.0–52.0)
Hemoglobin: 10.7 g/dL — ABNORMAL LOW (ref 13.0–17.0)
MCH: 29.5 pg (ref 26.0–34.0)
MCHC: 33 g/dL (ref 30.0–36.0)
MCV: 89.3 fL (ref 80.0–100.0)
Platelets: 709 10*3/uL — ABNORMAL HIGH (ref 150–400)
RBC: 3.63 MIL/uL — ABNORMAL LOW (ref 4.22–5.81)
RDW: 18.6 % — ABNORMAL HIGH (ref 11.5–15.5)
WBC: 24 10*3/uL — ABNORMAL HIGH (ref 4.0–10.5)
nRBC: 0 % (ref 0.0–0.2)

## 2020-10-02 LAB — PROCALCITONIN: Procalcitonin: 0.13 ng/mL

## 2020-10-02 LAB — PATHOLOGIST SMEAR REVIEW

## 2020-10-02 MED ORDER — ASPIRIN 81 MG PO CHEW
81.0000 mg | CHEWABLE_TABLET | Freq: Every day | ORAL | Status: DC
  Administered 2020-10-02 – 2020-10-03 (×2): 81 mg via ORAL
  Filled 2020-10-02 (×2): qty 1

## 2020-10-02 NOTE — TOC Progression Note (Signed)
Transition of Care [facility]) - Progression Note    Patient Details  Name: [name] MRN: [id number] Date of Birth: April 13, 1926  Transition of Care [facility]) CM/SW Contact  Charrisse Masley, Juliann Pulse, RN Phone Number: 10/02/2020, 3:07 PM  Clinical Narrative: Damaris Schooner to patient/son Shanon Brow about d/c plans-they both agree to return back to [facility]-son will provide independent private care givers for patient & patient's spouse;[facility]PT-[facility] will provide-will send HHPT order;PTAR @ d/c. DNR to be signed in shadow chart if not already done.Patient/son David's priority is the patient's spouse name Annalee Genta- to be there with her @ [place] facility.    Expected Discharge Plan: [place] Barriers to Discharge: Continued Medical Work up  Expected Discharge Plan and Services Expected Discharge Plan: [place]                                               Social Determinants of Health (SDOH) Interventions    Readmission Risk Interventions No flowsheet data found.

## 2020-10-02 NOTE — Progress Notes (Signed)
PROGRESS NOTE    [name]  [id number] DOB: 1926/02/28 DOA: 09/30/2020 PCP: Lavone Orn, MD     Brief Narrative:  [name] is a 87 y.o. male with history of dementia, CAD s/p CABG in 2010, AS s/p AVR, HTN and BPH returning to ED after recurrent fall. Patient was seen in ED yesterday after found down on the floor at independent living facility.  Work-up did not reveal any traumatic injuries.  He was found to have elevated troponin as well as elevated BNP.  Cardiology was consulted.  New events last 24 hours / Subjective: Patient sitting in chair, no complaints of CP or SOB. No nausea, vomiting. OT eval rec SNF.   Assessment & Plan:   Active Problems:   NSTEMI (non-ST elevated myocardial infarction) ([place])   Pressure injury of skin   NSTEMI, demand ischemia -Troponin 1908 --> 2850 -Appreciate cardiology -Echocardiogram with no wall motion abnormalities  -No further work up planned   Acute diastolic CHF -BNP [id number]  -Appreciate cardiology -Echocardiogram showed normal EF with grade 2 diastolic dysfunction -Restart Lasix 20 mg daily on discharge with follow-up BMP -Follow-up with Dr. Irish Lack in 2 weeks  Hypertension -Continue Norvasc   Recent fall -Fall precaution -PT OT evaluation recommending SNF placement.  TOC aware  Persistent leukocytosis -Since 09/10/2020 associated with thrombocytosis  -UA and urine culture negative -Blood culture negative -Sepsis ruled out, no source of infection found. Stop cefepime/vanco and monitor  -WBC improving off antibiotics -Will refer to outpatient hematology on discharge, order placed  Dementia -Delirium precaution  CKD stage IIIb -Baseline creatinine 1.5 -Stable  CAD -Continue aspirin, lipitor   Dysphagia -Appreciate SLP, status post MBS -Continue dysphagia 1 diet   In agreement with assessment of the pressure ulcer as below:  Pressure Injury 09/30/20 Buttocks Left Stage 2 -  Partial thickness loss of  dermis presenting as a shallow open injury with a red, pink wound bed without slough. wound red/purple dime sized (Active)  09/30/20 1758  Location: Buttocks  Location Orientation: Left  Staging: Stage 2 -  Partial thickness loss of dermis presenting as a shallow open injury with a red, pink wound bed without slough.  Wound Description (Comments): wound red/purple dime sized  Present on Admission: Yes     Pressure Injury 09/30/20 Buttocks Right Stage 2 -  Partial thickness loss of dermis presenting as a shallow open injury with a red, pink wound bed without slough. re/purple dime sized wound (Active)  09/30/20 1759  Location: Buttocks  Location Orientation: Right  Staging: Stage 2 -  Partial thickness loss of dermis presenting as a shallow open injury with a red, pink wound bed without slough.  Wound Description (Comments): re/purple dime sized wound  Present on Admission: Yes         DVT prophylaxis:  Place TED hose Start: 10/02/20 1053 enoxaparin (LOVENOX) injection 30 mg Start: 09/30/20 2200  Code Status: DNR  Family Communication: No family at bedside Disposition Plan:  Status is: Inpatient  Remains inpatient appropriate because:Unsafe d/c plan  Dispo: The patient is from:  Independent living facility              Anticipated d/c is to: SNF              Patient currently is medically stable to d/c.  Await SNF placement   Difficult to place patient No      Consultants:  Cardiology   Antimicrobials:  Anti-infectives (From admission, onward)  Start     Dose/Rate Route Frequency Ordered Stop   10/02/20 1800  vancomycin (VANCOREADY) IVPB 1250 mg/250 mL  Status:  Discontinued        1,250 mg 166.7 mL/hr over 90 Minutes Intravenous Every 48 hours 09/30/20 1705 10/01/20 1406   09/30/20 1800  ceFEPIme (MAXIPIME) 2 g in sodium chloride 0.9 % 100 mL IVPB  Status:  Discontinued        2 g 200 mL/hr over 30 Minutes Intravenous Every 24 hours 09/30/20 1659 10/01/20 1406    09/30/20 1715  vancomycin (VANCOREADY) IVPB 1500 mg/300 mL        1,500 mg 150 mL/hr over 120 Minutes Intravenous  Once 09/30/20 1705 09/30/20 2021        Objective: Vitals:   10/01/20 1155 10/01/20 2035 10/02/20 0438 10/02/20 0740  BP: (!) 142/62 (!) 153/69 (!) 152/62 (!) 160/62  Pulse: 69 73 66 69  Resp: '20 18 18 16  '$ Temp: 98 F (36.7 C) 98.1 F (36.7 C) 98.6 F (37 C) 98.2 F (36.8 C)  TempSrc: Oral Oral Oral Oral  SpO2: 95% 98% 95% 95%  Weight:        Intake/Output Summary (Last 24 hours) at 10/02/2020 1154 Last data filed at 10/02/2020 0921 Gross per 24 hour  Intake 530 ml  Output 1050 ml  Net -520 ml    Filed Weights   09/30/20 1721  Weight: 66.9 kg    Examination: General exam: Appears calm and comfortable  Respiratory system: Clear to auscultation. Respiratory effort normal. Cardiovascular system: S1 & S2 heard, RRR.  Bilateral +1 pedal edema. Gastrointestinal system: Abdomen is nondistended, soft and nontender. Normal bowel sounds heard. Central nervous system: Alert, nonfocal Extremities: Symmetric in appearance bilaterally  Skin: No rashes, lesions or ulcers on exposed skin  Psychiatry: Stable   Data Reviewed: I have personally reviewed following labs and imaging studies  CBC: Recent Labs  Lab 09/29/20 1740 09/30/20 1141 10/01/20 0422 10/02/20 0403  WBC 32.8* 43.6* 31.1* 24.0*  NEUTROABS 26.5* 36.9*  --   --   HGB 12.2* 12.8* 11.2* 10.7*  HCT 38.5* 38.6* 33.3* 32.4*  MCV 91.9 89.8 89.3 89.3  PLT 745* 756* 682* 709*    Basic Metabolic Panel: Recent Labs  Lab 09/29/20 1740 09/30/20 1141 10/01/20 0422 10/02/20 0403  NA 139 143 139 139  K 4.6 4.5 3.9 3.5  CL 108 110 107 107  CO2 21* '23 24 24  '$ GLUCOSE 106* 125* 112* 102*  BUN 34* 38* 38* 36*  CREATININE 1.63* 1.78* 1.78* 1.68*  CALCIUM 9.0 9.4 8.5* 8.5*    GFR: Estimated Creatinine Clearance: 27.1 mL/min (A) (by C-G formula based on SCr of 1.68 mg/dL (H)). Liver Function  Tests: Recent Labs  Lab 09/30/20 1141  AST 56*  ALT 20  ALKPHOS 89  BILITOT 1.5*  PROT 7.9  ALBUMIN 4.0    No results for input(s): LIPASE, AMYLASE in the last 168 hours. No results for input(s): AMMONIA in the last 168 hours. Coagulation Profile: No results for input(s): INR, PROTIME in the last 168 hours. Cardiac Enzymes: Recent Labs  Lab 09/29/20 1740  CKTOTAL 102    BNP (last 3 results) No results for input(s): PROBNP in the last 8760 hours. HbA1C: No results for input(s): HGBA1C in the last 72 hours. CBG: No results for input(s): GLUCAP in the last 168 hours. Lipid Profile: No results for input(s): CHOL, HDL, LDLCALC, TRIG, CHOLHDL, LDLDIRECT in the last 72 hours. Thyroid Function Tests:  No results for input(s): TSH, T4TOTAL, FREET4, T3FREE, THYROIDAB in the last 72 hours. Anemia Panel: No results for input(s): VITAMINB12, FOLATE, FERRITIN, TIBC, IRON, RETICCTPCT in the last 72 hours. Sepsis Labs: Recent Labs  Lab 09/30/20 1141 09/30/20 1833 09/30/20 2128 10/01/20 0422 10/02/20 0403  PROCALCITON 0.23  --   --  0.20 0.13  LATICACIDVEN  --  1.7 1.4  --   --      Recent Results (from the past 240 hour(s))  Resp Panel by RT-PCR (Flu A&B, Covid) Nasopharyngeal Swab     Status: None   Collection Time: 09/30/20 11:41 AM   Specimen: Nasopharyngeal Swab; Nasopharyngeal(NP) swabs in vial transport medium  Result Value Ref Range Status   SARS Coronavirus 2 by RT PCR NEGATIVE NEGATIVE Final    Comment: (NOTE) SARS-CoV-2 target nucleic acids are NOT DETECTED.  The SARS-CoV-2 RNA is generally detectable in upper respiratory specimens during the acute phase of infection. The lowest concentration of SARS-CoV-2 viral copies this assay can detect is 138 copies/mL. A negative result does not preclude SARS-Cov-2 infection and should not be used as the sole basis for treatment or other patient management decisions. A negative result may occur with  improper specimen  collection/handling, submission of specimen other than nasopharyngeal swab, presence of viral mutation(s) within the areas targeted by this assay, and inadequate number of viral copies(<138 copies/mL). A negative result must be combined with clinical observations, patient history, and epidemiological information. The expected result is Negative.  Fact Sheet for Patients:  EntrepreneurPulse.com.au  Fact Sheet for Healthcare Providers:  IncredibleEmployment.be  This test is no t yet approved or cleared by the [place] FDA and  has been authorized for detection and/or diagnosis of SARS-CoV-2 by FDA under an Emergency Use Authorization (EUA). This EUA will remain  in effect (meaning this test can be used) for the duration of the COVID-19 declaration under Section 564(b)(1) of the Act, 21 U.S.C.section 360bbb-3(b)(1), unless the authorization is terminated  or revoked sooner.       Influenza A by PCR NEGATIVE NEGATIVE Final   Influenza B by PCR NEGATIVE NEGATIVE Final    Comment: (NOTE) The Xpert Xpress SARS-CoV-2/FLU/RSV plus assay is intended as an aid in the diagnosis of influenza from Nasopharyngeal swab specimens and should not be used as a sole basis for treatment. Nasal washings and aspirates are unacceptable for Xpert Xpress SARS-CoV-2/FLU/RSV testing.  Fact Sheet for Patients: EntrepreneurPulse.com.au  Fact Sheet for Healthcare Providers: IncredibleEmployment.be  This test is not yet approved or cleared by the [place] FDA and has been authorized for detection and/or diagnosis of SARS-CoV-2 by FDA under an Emergency Use Authorization (EUA). This EUA will remain in effect (meaning this test can be used) for the duration of the COVID-19 declaration under Section 564(b)(1) of the Act, 21 U.S.C. section 360bbb-3(b)(1), unless the authorization is terminated or revoked.  Performed at [facility], [place] [address], [place], [place] [address]   Urine Culture     Status: None   Collection Time: 09/30/20 11:41 AM   Specimen: Urine, Clean Catch  Result Value Ref Range Status   Specimen Description   Final    URINE, CLEAN CATCH Performed at [facility], [place] [address], [place], [place] [address]    Special Requests   Final    NONE Performed at [facility], [place] [address], [place], [place] [address]    Culture   Final    NO GROWTH Performed  at [facility], [place] [address] [place], [place] [address]    Report Status 10/01/2020 FINAL  Final  Culture, blood (routine x 2)     Status: None (Preliminary result)   Collection Time: 09/30/20  6:33 PM   Specimen: BLOOD  Result Value Ref Range Status   Specimen Description   Final    BLOOD LEFT ANTECUBITAL Performed at [facility] [address], [place], [place] [address]    Special Requests   Final    BOTTLES DRAWN AEROBIC AND ANAEROBIC Blood Culture adequate volume Performed at [place] [address] [place], [place] [address]    Culture   Final    NO GROWTH 2 DAYS Performed at [facility] [address], [place], [place] [address]    Report Status PENDING  Incomplete  Culture, blood (routine x 2)     Status: None (Preliminary result)   Collection Time: 09/30/20  6:33 PM   Specimen: BLOOD  Result Value Ref Range Status   Specimen Description   Final    BLOOD BLOOD LEFT HAND Performed at [facility] [address] [place], [place] [address]    Special Requests   Final    BOTTLES DRAWN AEROBIC AND ANAEROBIC Blood Culture adequate volume Performed at [facility] [address], [place], [place] [address]    Culture   Final    NO GROWTH 2 DAYS Performed at [facility] [address], [place], [place] [address]    Report Status PENDING  Incomplete        Radiology Studies: CT HEAD WO CONTRAST  Result Date: 09/30/2020 CLINICAL DATA:  Head trauma. EXAM: CT HEAD WITHOUT CONTRAST TECHNIQUE: Contiguous axial images were obtained from the base of the skull through the vertex without intravenous contrast. COMPARISON:  September 29, 2020 FINDINGS: Brain: No evidence of acute infarction, hemorrhage, hydrocephalus, extra-axial collection or mass lesion/mass effect. Moderate brain parenchymal volume loss and deep white matter microangiopathy. Vascular: No hyperdense vessel or unexpected calcification. Skull: Normal. Negative for fracture or focal lesion. Sinuses/Orbits: No acute finding. Other: None. IMPRESSION: 1. No acute intracranial abnormality. 2. Moderate brain parenchymal atrophy and chronic microvascular disease. Electronically Signed   By: Fidela Salisbury M.D.   On: 09/30/2020 14:21   CT CHEST WO CONTRAST  Result Date: 09/30/2020 CLINICAL DATA:  Fall EXAM: CT CHEST WITHOUT CONTRAST TECHNIQUE: Multidetector CT imaging of the chest was performed following the standard protocol without IV contrast. COMPARISON:  Prior CT scan of the chest 04/30/2015 FINDINGS: Cardiovascular: Limited evaluation in the absence of intravenous contrast. No evidence of aortic aneurysm. Scattered atherosclerotic plaque throughout the abdominal aorta. Mild cardiomegaly. Extensive coronary artery atherosclerotic calcifications. Surgical changes of prior aortic valve replacement. No pericardial effusion. Mediastinum/Nodes: Unremarkable CT appearance of the thyroid gland. No suspicious mediastinal or hilar adenopathy. No soft tissue mediastinal mass. The thoracic esophagus is unremarkable. Lungs/Pleura: Small left and moderate right layering pleural effusions. Diffuse chronic bronchial wall thickening. Atelectasis present in both lower lobes. Mild interlobular septal thickening in the apices and bases. No suspicious pulmonary mass or nodule. Upper Abdomen: No acute abnormality.  Musculoskeletal: No chest wall mass or suspicious bone lesions identified. Mild dextroconvex scoliosis. IMPRESSION: 1. No evidence of acute injury to the thorax. 2. Cardiomegaly with extensive coronary artery calcifications and surgical changes of prior aortic valve replacement. 3. Moderate right and small left layering pleural effusions. Combined with trace interstitial pulmonary edema,  findings suggest mild or chronic CHF. 4. Diffuse chronic bronchial wall thickening bilaterally. Aortic Atherosclerosis (ICD10-I70.0). Electronically Signed   By: Jacqulynn Cadet M.D.   On: 09/30/2020 14:31   CT CERVICAL SPINE WO CONTRAST  Result Date: 09/30/2020 CLINICAL DATA:  Post fall.  Neck trauma. EXAM: CT CERVICAL SPINE WITHOUT CONTRAST TECHNIQUE: Multidetector CT imaging of the cervical spine was performed without intravenous contrast. Multiplanar CT image reconstructions were also generated. COMPARISON:  September 29, 2020 FINDINGS: Alignment: Normal. Skull base and vertebrae: No acute fracture. No primary bone lesion or focal pathologic process. Soft tissues and spinal canal: No prevertebral fluid or swelling. No visible canal hematoma. Disc levels:  Multilevel osteoarthritic changes. Upper chest: Negative. Other: None. IMPRESSION: 1. No evidence of acute traumatic injury to the cervical spine. 2. Multilevel osteoarthritic changes of the cervical spine. Electronically Signed   By: Fidela Salisbury M.D.   On: 09/30/2020 14:26   CT ABDOMEN PELVIS W CONTRAST  Result Date: 09/30/2020 CLINICAL DATA:  Fall EXAM: CT ABDOMEN AND PELVIS WITH CONTRAST TECHNIQUE: Multidetector CT imaging of the abdomen and pelvis was performed using the standard protocol following bolus administration of intravenous contrast. CONTRAST:  72m OMNIPAQUE IOHEXOL 350 MG/ML SOLN COMPARISON:  None. FINDINGS: Lower chest: See concurrently obtained but separately dictated CT scan of the chest. Hepatobiliary: No focal liver abnormality is seen. No  gallstones, gallbladder wall thickening, or biliary dilatation. Pancreas: Unremarkable. No pancreatic ductal dilatation or surrounding inflammatory changes. Spleen: Normal in size without focal abnormality. Adrenals/Urinary Tract: Unremarkable adrenal glands. Renal cortical atrophy bilaterally. Nonobstructing bilateral nephrolithiasis. No enhancing renal mass. No ureteral abnormalities. The bladder is within normal limits. Stomach/Bowel: Scattered colonic diverticula without evidence of active inflammation. Normal appendix. No focal bowel wall thickening or evidence of obstruction. Vascular/Lymphatic: Atherosclerotic calcifications throughout the abdominal aorta without evidence of aneurysm. No suspicious lymphadenopathy. Reproductive: Prostate is unremarkable. Other: No abdominal wall hernia or abnormality. No abdominopelvic ascites. Musculoskeletal: No acute or significant osseous findings. Bilateral lower lumbar facet arthropathy. IMPRESSION: 1. No acute abnormality or evidence of injury in the abdomen or pelvis. 2. Bilateral nonobstructing nephrolithiasis. 3.  Aortic Atherosclerosis (ICD10-I70.0). 4. Lower lumbar facet arthropathy. 5. Scattered colonic diverticula without evidence of active inflammation. Electronically Signed   By: HJacqulynn CadetM.D.   On: 09/30/2020 14:25   CT L-SPINE NO CHARGE  Result Date: 09/30/2020 CLINICAL DATA:  Fall W19.XXXA (ICD-10-CM) EXAM: CT LUMBAR SPINE WITHOUT CONTRAST TECHNIQUE: Multidetector CT imaging of the lumbar spine was performed without intravenous contrast administration. Multiplanar CT image reconstructions were also generated. COMPARISON:  None. FINDINGS: Segmentation: 5 non rib-bearing lumbar vertebral bodies. Alignment: No substantial sagittal subluxation. Mild broad levocurvature. Vertebrae: Vertebral body heights are maintained. No evidence of acute fracture. Diffuse osteopenia. Paraspinal and other soft tissues: Please see concurrent CT abdomen/pelvis for  intra-abdominal evaluation. Disc levels: Broad disc bulge at L4-L5 with at least mild canal stenosis. Severe lower lumbar facet arthropathy. Probable foraminal stenosis on the left at L4-L5 and L5-S1. IMPRESSION: 1. No evidence of acute fracture or traumatic malalignment. 2. Severe lower lumbar facet arthropathy with likely at least mild canal stenosis at L4-L5 and probable left foraminal stenosis at L4-L5 and L5-S1. An MRI of the lumbar spine could better characterize the canal and foramina if clinically indicated. 3. Osteopenia. Electronically Signed   By: FMargaretha SheffieldM.D.   On: 09/30/2020 13:58   DG Swallowing Func-Speech Pathology  Result Date: 10/01/2020 Table formatting from the original result was not included. Objective Swallowing Evaluation: Type  of Study: MBS-Modified Barium Swallow Study  Patient Details Name: RENIER CHADDOCK MRN: [id number] Date of Birth: 09/23/1926 Today's Date: 10/01/2020 Time: SLP Start Time (ACUTE ONLY): [id number] -SLP Stop Time (ACUTE ONLY): [id number] SLP Time Calculation (min) (ACUTE ONLY): 20 min Past Medical History: Past Medical History: Diagnosis Date  BPH (benign prostatic hyperplasia)   Coronary artery disease with hx of myocardial infarct w/o hx of CABG 2010  Dehydration   Diverticulosis   HOH (hard of hearing)   hearing aids  Hyperlipidemia   Hypertension   Impaired fasting glucose   Memory difficulty 10/19/2012  Memory loss   Mesenteric panniculitis ([place])   Retinal tear   Severe aortic stenosis 2010  S/P AVR-TISSUE VALVE Past Surgical History: Past Surgical History: Procedure Laterality Date  2 VESSEL CABG    AORTIC VALVE REPLACEMENT    BARTLE    CATARACT EXTRACTION, BILATERAL    RT 2010 LT 2014  TONSILLECTOMY   HPI: Patient is a 87 y.o. male with PMH: dementia, CAD s/p CABG in 2010, AS s/p AVR, HTN and BPH returning to ED after recurrent fall. In ED, vitals WNL except slightly elevated BP, CXR revealed atelectasis/possible pneumonia/possible contusion, CT head and spine  without acute finding, CTA chest showed cardiomegaly, moderate right and small left pleural effusion.  Subjective: pleasant, cooperative Assessment / Plan / Recommendation CHL IP CLINICAL IMPRESSIONS 10/01/2020 Clinical Impression Patient presents with a mild-moderate oropharyngeal dysphagia with a suspected cervical esophageal component as well. Patient exhibited oral delays with all tested boluses and when taking straw sips of thin liquids or nectar thick liquids, would appear to fill up oral cavity and hold liquid until fairly full prior to swallow. Premature spillage from oral cavity into vallecular sinus was observed with thin liquids. Patient exhibited piecemeal swallowing with puree solids. During pharyngeal phase, patient exhibited trace penetration (PAS 3) during the swallow with thin liquids via straw sips but no aspiration. He did exhibit aspiration of trace amount (PAS 8) during and after the swallow when consuming thin liquids via cup sips. SLP suspects patients awareness to liquids during cup sips was poor leading to aspiration, however improved when using straw sips. No penetration or aspiration observed with straw sips of nectar thick liquids. Puree solids resulted in trace to mild vallecular and pyriform sinus residuals as well as reduced pharyngeal transit. Trace residuals observed in pharynx in vallecular and pyriform sinuses with thin liquids and nectar thick liquids. Patient exhibited reduced UES relaxation with retention of barium just above UES observed. In addition, SLP observed presence of likely cervical osteophyte (no radiologist present to confirm). SLP is recommending Dys 1, nectar thick liquids but allow thin liquids in between meals with straw only (no cup sips). SLP Visit Diagnosis -- Attention and concentration deficit following -- Frontal lobe and executive function deficit following -- Impact on safety and function --   CHL IP TREATMENT RECOMMENDATION 10/01/2020 Treatment  Recommendations Therapy as outlined in treatment plan below   Prognosis 10/01/2020 Prognosis for Safe Diet Advancement Fair Barriers to Reach Goals Cognitive deficits Barriers/Prognosis Comment patient with h/o dementia CHL IP DIET RECOMMENDATION 10/01/2020 SLP Diet Recommendations Dysphagia 1 (Puree) solids;Nectar thick liquid;Other (Comment) Liquid Administration via Straw;Other (Comment) Medication Administration Crushed with puree Compensations Minimize environmental distractions;Small sips/bites;Slow rate Postural Changes --   CHL IP OTHER RECOMMENDATIONS 10/01/2020 Recommended Consults -- Oral Care Recommendations Oral care BID;Staff/trained caregiver to provide oral care Other Recommendations Order thickener from pharmacy;Prohibited food (jello, ice cream, thin soups);Remove water pitcher;Clarify dietary restrictions  CHL IP FOLLOW UP RECOMMENDATIONS 10/01/2020 Follow up Recommendations 24 hour supervision/assistance;Skilled Nursing facility   [facility] IP FREQUENCY AND DURATION 10/01/2020 Speech Therapy Frequency (ACUTE ONLY) min 1 x/week Treatment Duration 1 week      CHL IP ORAL PHASE 10/01/2020 Oral Phase Impaired Oral - Pudding Teaspoon -- Oral - Pudding Cup -- Oral - Honey Teaspoon -- Oral - Honey Cup -- Oral - Nectar Teaspoon -- Oral - Nectar Cup -- Oral - Nectar Straw Weak lingual manipulation;Delayed oral transit;Piecemeal swallowing;Reduced posterior propulsion Oral - Thin Teaspoon -- Oral - Thin Cup Weak lingual manipulation;Holding of bolus;Piecemeal swallowing;Delayed oral transit;Reduced posterior propulsion;Premature spillage Oral - Thin Straw Reduced posterior propulsion;Delayed oral transit;Weak lingual manipulation;Holding of bolus;Premature spillage Oral - Puree Weak lingual manipulation;Reduced posterior propulsion;Delayed oral transit;Piecemeal swallowing Oral - Mech Soft -- Oral - Regular -- Oral - Multi-Consistency -- Oral - Pill Weak lingual manipulation;Reduced posterior propulsion;Delayed oral  transit Oral Phase - Comment --  CHL IP PHARYNGEAL PHASE 10/01/2020 Pharyngeal Phase Impaired Pharyngeal- Pudding Teaspoon -- Pharyngeal -- Pharyngeal- Pudding Cup -- Pharyngeal -- Pharyngeal- Honey Teaspoon -- Pharyngeal -- Pharyngeal- Honey Cup -- Pharyngeal -- Pharyngeal- Nectar Teaspoon -- Pharyngeal -- Pharyngeal- Nectar Cup -- Pharyngeal -- Pharyngeal- Nectar Straw Pharyngeal residue - valleculae;Pharyngeal residue - pyriform;Reduced anterior laryngeal mobility Pharyngeal -- Pharyngeal- Thin Teaspoon -- Pharyngeal -- Pharyngeal- Thin Cup Reduced anterior laryngeal mobility;Penetration/Aspiration during swallow;Penetration/Apiration after swallow;Trace aspiration;Pharyngeal residue - valleculae;Pharyngeal residue - pyriform Pharyngeal Material enters airway, passes BELOW cords without attempt by patient to eject out (silent aspiration);Material enters airway, CONTACTS cords and not ejected out Pharyngeal- Thin Straw Delayed swallow initiation-vallecula;Penetration/Aspiration during swallow;Pharyngeal residue - valleculae;Pharyngeal residue - pyriform;Reduced anterior laryngeal mobility;Reduced airway/laryngeal closure Pharyngeal Material enters airway, remains ABOVE vocal cords and not ejected out Pharyngeal- Puree Delayed swallow initiation-vallecula;Pharyngeal residue - pyriform;Pharyngeal residue - valleculae;Reduced anterior laryngeal mobility;Reduced pharyngeal peristalsis Pharyngeal -- Pharyngeal- Mechanical Soft -- Pharyngeal -- Pharyngeal- Regular -- Pharyngeal -- Pharyngeal- Multi-consistency -- Pharyngeal -- Pharyngeal- Pill Delayed swallow initiation-vallecula;Pharyngeal residue - pyriform;Pharyngeal residue - valleculae;Reduced pharyngeal peristalsis Pharyngeal -- Pharyngeal Comment --  CHL IP CERVICAL ESOPHAGEAL PHASE 10/01/2020 Cervical Esophageal Phase Impaired Pudding Teaspoon -- Pudding Cup -- Honey Teaspoon -- Honey Cup -- Nectar Teaspoon -- Nectar Cup -- Nectar Straw Reduced cricopharyngeal  relaxation Thin Teaspoon -- Thin Cup Reduced cricopharyngeal relaxation Thin Straw Reduced cricopharyngeal relaxation Puree Reduced cricopharyngeal relaxation Mechanical Soft -- Regular -- Multi-consistency -- Pill Reduced cricopharyngeal relaxation Cervical Esophageal Comment -- Sonia Baller, MA, CCC-SLP Speech Therapy             ECHOCARDIOGRAM COMPLETE  Result Date: 10/01/2020    ECHOCARDIOGRAM REPORT   Patient Name:   ALDRIC KOEPP Date of Exam: 10/01/2020 Medical Rec #:  [id number]        Height:       72.0 in Accession #:    [id number]       Weight:       147.5 lb Date of Birth:  09/24/1926        BSA:          1.872 m Patient Age:    89 years         BP:           142/62 mmHg Patient Gender: M                HR:           69 bpm. Exam Location:  Inpatient Procedure: 2D Echo, 3D Echo, Color Doppler  and Cardiac Doppler Indications:    CHF-Acute Diastolic [id number]  History:        Patient has prior history of Echocardiogram examinations, most                 recent 01/11/2020. CAD, Prior CABG; Risk Factors:Hypertension                 and Dyslipidemia.                 Aortic Valve: 23 mm Edwards pericardial valve is present in the                 aortic position. Procedure Date: 05/21/2010.  Sonographer:    Darlina Sicilian RDCS Referring Phys: [id number] Charlesetta Ivory GONFA IMPRESSIONS  1. Left ventricular ejection fraction, by estimation, is 60 to 65%. The left ventricle has normal function. The left ventricle has no regional wall motion abnormalities. Left ventricular diastolic parameters are consistent with Grade II diastolic dysfunction (pseudonormalization). Elevated left ventricular end-diastolic pressure. The E/e' is 45.  2. Right ventricular systolic function is mildly reduced. The right ventricular size is normal.  3. Left atrial size was mildly dilated.  4. Right atrial size was mildly dilated.  5. The mitral valve is grossly normal. Mild mitral valve regurgitation.  6. The aortic valve has been  repaired/replaced. Aortic valve regurgitation is not visualized. Mild aortic valve sclerosis is present, with no evidence of aortic valve stenosis. There is a 23 mm Edwards pericardial valve present in the aortic position. Procedure Date: 05/21/2010. Aortic valve mean gradient measures 11.0 mmHg. DI is 0.31. EROA is 1.31 cm2. Findings suggest mild prosthetic valve stenosis.  7. The inferior vena cava is normal in size with <50% respiratory variability, suggesting right atrial pressure of 8 mmHg. Comparison(s): 01/11/2020: LVEF 60-65%, bioprosthetic AVR - mean gradient 10 mmHg, DI 0.55. FINDINGS  Left Ventricle: Left ventricular ejection fraction, by estimation, is 60 to 65%. The left ventricle has normal function. The left ventricle has no regional wall motion abnormalities. The left ventricular internal cavity size was normal in size. There is  no left ventricular hypertrophy. Abnormal (paradoxical) septal motion consistent with post-operative status. Left ventricular diastolic parameters are consistent with Grade II diastolic dysfunction (pseudonormalization). Elevated left ventricular end-diastolic pressure. The E/e' is 47. Right Ventricle: The right ventricular size is normal. No increase in right ventricular wall thickness. Right ventricular systolic function is mildly reduced. Left Atrium: Left atrial size was mildly dilated. Right Atrium: Right atrial size was mildly dilated. Pericardium: There is no evidence of pericardial effusion. Mitral Valve: The mitral valve is grossly normal. Mild to moderate mitral annular calcification. Mild mitral valve regurgitation. Tricuspid Valve: The tricuspid valve is grossly normal. Tricuspid valve regurgitation is trivial. Aortic Valve: The aortic valve has been repaired/replaced. Aortic valve regurgitation is not visualized. Mild aortic valve sclerosis is present, with no evidence of aortic valve stenosis. Aortic valve mean gradient measures 11.0 mmHg. Aortic valve peak  gradient measures 17.1 mmHg. Aortic valve area, by VTI measures 1.31 cm. There is a 23 mm Edwards pericardial valve present in the aortic position. Procedure Date: 05/21/2010. Pulmonic Valve: The pulmonic valve was grossly normal. Pulmonic valve regurgitation is trivial. Aorta: The aortic root and ascending aorta are structurally normal, with no evidence of dilitation. Venous: The inferior vena cava is normal in size with less than 50% respiratory variability, suggesting right atrial pressure of 8 mmHg. IAS/Shunts: No atrial level shunt detected by color flow Doppler.  LEFT  VENTRICLE PLAX 2D LVIDd:         5.30 cm  Diastology LVIDs:         4.30 cm  LV e' medial:    3.77 cm/s LV PW:         0.90 cm  LV E/e' medial:  34.5 LV IVS:        0.90 cm  LV e' lateral:   6.25 cm/s LVOT diam:     2.30 cm  LV E/e' lateral: 20.8 LV SV:         64 LV SV Index:   34 LVOT Area:     4.15 cm  RIGHT VENTRICLE RV S prime:     9.81 cm/s TAPSE (M-mode): 1.9 cm LEFT ATRIUM             Index       RIGHT ATRIUM           Index LA diam:        4.90 cm 2.62 cm/m  RA Area:     22.30 cm LA Vol (A2C):   64.7 ml 34.56 ml/m RA Volume:   63.80 ml  34.08 ml/m LA Vol (A4C):   61.0 ml 32.56 ml/m LA Biplane Vol: 66.5 ml 35.53 ml/m  AORTIC VALVE                    PULMONIC VALVE AV Area (Vmax):    1.37 cm     PV Vmax:          2.17 m/s AV Area (Vmean):   1.25 cm     PV Peak grad:     18.8 mmHg AV Area (VTI):     1.31 cm     PR End Diast Vel: 7.95 msec AV Vmax:           207.00 cm/s AV Vmean:          156.000 cm/s AV VTI:            0.487 m AV Peak Grad:      17.1 mmHg AV Mean Grad:      11.0 mmHg LVOT Vmax:         68.50 cm/s LVOT Vmean:        47.100 cm/s LVOT VTI:          0.153 m LVOT/AV VTI ratio: 0.31  AORTA Ao Root diam: 2.80 cm Ao Asc diam:  3.30 cm MITRAL VALVE MV Area (PHT): 4.39 cm     SHUNTS MV Decel Time: 173 msec     Systemic VTI:  0.15 m MV E velocity: 130.00 cm/s  Systemic Diam: 2.30 cm MV A velocity: 53.30 cm/s MV E/A ratio:   2.44 Lyman Bishop MD Electronically signed by Lyman Bishop MD Signature Date/Time: 10/01/2020/3:22:44 PM    Final    CT Angio Chest/Abd/Pel for Dissection W and/or Wo Contrast  Result Date: 09/30/2020 CLINICAL DATA:  Abdominal pain. Elevated troponin. Clinical concern for aortic dissection. EXAM: CT ANGIOGRAPHY CHEST, ABDOMEN AND PELVIS TECHNIQUE: Non-contrast CT of the chest was initially obtained. Multidetector CT imaging through the chest, abdomen and pelvis was performed using the standard protocol during bolus administration of intravenous contrast. Multiplanar reconstructed images and MIPs were obtained and reviewed to evaluate the vascular anatomy. CONTRAST:  15m OMNIPAQUE IOHEXOL 350 MG/ML SOLN COMPARISON:  Chest CT obtained without contrast earlier today and abdomen and pelvis CT obtained with contrast earlier today. FINDINGS: The images are limited by photon starvation and streak  artifacts produced by the patient's arms not being raised above his head. CTA CHEST FINDINGS Cardiovascular: Median sternotomy wires, post CABG changes and prosthetic aortic valve. Aortic and coronary artery calcifications. No aortic aneurysm or dissection. Mildly enlarged heart with left ventricular and biatrial enlargement. Mediastinum/Nodes: No enlarged mediastinal, hilar, or axillary lymph nodes. Thyroid gland, trachea, and esophagus demonstrate no significant findings. Lungs/Pleura: Stable moderate right and small left pleural effusions, prominent pulmonary vasculature, mildly prominent interstitial markings, diffuse peribronchial thickening and mild bilateral dependent atelectasis. Musculoskeletal: Thoracic and lower cervical spine degenerative changes, including changes of DISH. Review of the MIP images confirms the above findings. CTA ABDOMEN AND PELVIS FINDINGS VASCULAR Aorta: Extensive calcified and noncalcified mural plaque formation in the abdominal aorta, most pronounced in the infrarenal region. In that area,  this is causing approximately 50% luminal narrowing. No aneurysm or acute dissection seen. Celiac: Calcified and noncalcified plaque at the origin producing greater than 90% stenosis. Normally opacified distally. SMA: Calcified plaque formation at the origin and proximally, producing approximately 50% stenosis. Renals: Dense calcified plaque formation at the origins causing approximately 90% or greater stenosis bilaterally. IMA: Noncalcified plaque at the origin producing approximately 90% stenosis. Normally opacified distally. Inflow: Bilateral common iliac calcified plaque formation causing about 50% luminal stenosis on the left and 20% luminal stenosis on the right. There is also mild calcified plaque formation involving the internal iliac arteries bilaterally. The external iliac arteries are unremarkable. There is some calcified plaque involving both common femoral arteries without significant luminal stenosis. Veins: No obvious venous abnormality within the limitations of this arterial phase study. Review of the MIP images confirms the above findings. NON-VASCULAR Hepatobiliary: No focal liver abnormality is seen. No gallstones, gallbladder wall thickening, or biliary dilatation. Pancreas: Unremarkable. No pancreatic ductal dilatation or surrounding inflammatory changes. Spleen: Mildly prominent without abnormal enlargement. Adrenals/Urinary Tract: Normal appearing adrenal glands. Small bilateral renal calculi. Excreted contrast in the urinary bladder. Unremarkable ureters. Stomach/Bowel: Multiple colonic diverticula without evidence of diverticulitis. Normal appearing appendix. Unremarkable small bowel and stomach. Lymphatic: No enlarged lymph nodes. Reproductive: Moderately enlarged prostate gland. Other: Small to moderate-sized left inguinal hernia containing fat and small right inguinal hernia containing fat. Musculoskeletal: Lumbar spine degenerative changes. Review of the MIP images confirms the above  findings. IMPRESSION: 1. No aortic aneurysm or dissection. 2. Extensive arterial atheromatous changes causing approximately 50% luminal stenosis of the infrarenal abdominal aorta, greater than 90% stenosis of the proximal celiac axis, approximately 50% stenosis of the proximal SMA, approximately 90% or greater stenosis of the renal arteries bilaterally and approximately 90% stenosis of the proximal IMA. 3. Stable changes of congestive heart failure including bilateral pleural effusions, interstitial pulmonary edema, pulmonary vascular congestion and bronchitic changes. 4. Colonic diverticulosis. 5. Bilateral nonobstructing renal calculi. Electronically Signed   By: Claudie Revering M.D.   On: 09/30/2020 17:36      Scheduled Meds:  amLODipine  5 mg Oral Daily   aspirin  81 mg Oral Daily   atorvastatin  20 mg Oral Daily   enoxaparin (LOVENOX) injection  30 mg Subcutaneous Q24H   Continuous Infusions:     LOS: 2 days      Time spent: 25 minutes   Dessa Phi, DO [facility] 10/02/2020, 11:54 AM   Available via Epic secure chat 7am-7pm After these hours, please refer to coverage provider listed on [email or web address]

## 2020-10-02 NOTE — Progress Notes (Signed)
Progress Note  Patient Name: [name] Date of Encounter: 10/02/2020  [facility] Cardiologist: Larae Grooms, MD   Subjective   No chest pain or SOB  Inpatient Medications    Scheduled Meds:  amLODipine  5 mg Oral Daily   aspirin  81 mg Oral Daily   atorvastatin  20 mg Oral Daily   enoxaparin (LOVENOX) injection  30 mg Subcutaneous Q24H   Continuous Infusions:   PRN Meds: acetaminophen **OR** acetaminophen   Vital Signs    Vitals:   10/01/20 1155 10/01/20 2035 10/02/20 0438 10/02/20 0740  BP: (!) 142/62 (!) 153/69 (!) 152/62 (!) 160/62  Pulse: 69 73 66 69  Resp: '20 18 18 16  '$ Temp: 98 F (36.7 C) 98.1 F (36.7 C) 98.6 F (37 C) 98.2 F (36.8 C)  TempSrc: Oral Oral Oral Oral  SpO2: 95% 98% 95% 95%  Weight:        Intake/Output Summary (Last 24 hours) at 10/02/2020 1038 Last data filed at 10/02/2020 [id number] Gross per 24 hour  Intake 530 ml  Output 1050 ml  Net -520 ml    Last 3 Weights 09/30/2020 09/29/2020 01/23/2020  Weight (lbs) 147 lb 7.8 oz 156 lb 8.4 oz 155 lb  Weight (kg) 66.9 kg 71 kg 70.308 kg      Telemetry    NSR - Personally Reviewed  ECG    No new EKG to review - Personally Reviewed  Physical Exam  GEN: Well nourished, well developed in no acute distress HEENT: Normal NECK: No JVD; No carotid bruits LYMPHATICS: No lymphadenopathy CARDIAC:RRR, no murmurs, rubs, gallops RESPIRATORY:  Clear to auscultation without rales, wheezing or rhonchi  ABDOMEN: Soft, non-tender, non-distended MUSCULOSKELETAL:  No edema; No deformity  SKIN: Warm and dry NEUROLOGIC:  Alert and oriented x 3 PSYCHIATRIC:  Normal affect    Labs    High Sensitivity Troponin:   Recent Labs  Lab 09/30/20 1141 09/30/20 1411  TROPONINIHS 1,908* 2,850*       Chemistry Recent Labs  Lab 09/30/20 1141 10/01/20 0422 10/02/20 0403  NA 143 139 139  K 4.5 3.9 3.5  CL 110 107 107  CO2 '23 24 24  '$ GLUCOSE 125* 112* 102*  BUN 38* 38* 36*  CREATININE  1.78* 1.78* 1.68*  CALCIUM 9.4 8.5* 8.5*  PROT 7.9  --   --   ALBUMIN 4.0  --   --   AST 56*  --   --   ALT 20  --   --   ALKPHOS 89  --   --   BILITOT 1.5*  --   --   GFRNONAA 36* 36* 38*  ANIONGAP '10 8 8      '$ Hematology Recent Labs  Lab 09/30/20 1141 10/01/20 0422 10/02/20 0403  WBC 43.6* 31.1* 24.0*  RBC 4.30 3.73* 3.63*  HGB 12.8* 11.2* 10.7*  HCT 38.6* 33.3* 32.4*  MCV 89.8 89.3 89.3  MCH 29.8 30.0 29.5  MCHC 33.2 33.6 33.0  RDW 18.8* 18.7* 18.6*  PLT 756* 682* 709*     BNP Recent Labs  Lab 09/30/20 1141  BNP 2,356.4*      DDimer No results for input(s): DDIMER in the last 168 hours.  Radiology    DG Elbow Complete Left  Result Date: 09/30/2020 CLINICAL DATA:  Fall in a 87 year old male with bilateral skin tears to the elbows. EXAM: LEFT ELBOW - COMPLETE 3+ VIEW COMPARISON:  Contralateral elbow FINDINGS: Small well corticated bone fragments adjacent to the lateral  epicondyle. No acute fracture or dislocation. No substantial soft tissue swelling or sign of joint effusion. IMPRESSION: No acute fracture or traumatic malalignment. Small well corticated bone fragments adjacent to the lateral epicondyle may reflect remote injury or sequela of prior epicondylitis. Electronically Signed   By: Zetta Bills M.D.   On: 09/30/2020 12:08   DG Elbow Complete Right  Result Date: 09/30/2020 CLINICAL DATA:  Right elbow pain and skin tears following a fall. EXAM: RIGHT ELBOW - COMPLETE 3+ VIEW COMPARISON:  09/29/2020 FINDINGS: Decreased posterior elbow swelling. No fracture, dislocation or effusion. IMPRESSION: No fracture or dislocation. Electronically Signed   By: Claudie Revering M.D.   On: 09/30/2020 12:12   CT HEAD WO CONTRAST  Result Date: 09/30/2020 CLINICAL DATA:  Head trauma. EXAM: CT HEAD WITHOUT CONTRAST TECHNIQUE: Contiguous axial images were obtained from the base of the skull through the vertex without intravenous contrast. COMPARISON:  September 29, 2020 FINDINGS:  Brain: No evidence of acute infarction, hemorrhage, hydrocephalus, extra-axial collection or mass lesion/mass effect. Moderate brain parenchymal volume loss and deep white matter microangiopathy. Vascular: No hyperdense vessel or unexpected calcification. Skull: Normal. Negative for fracture or focal lesion. Sinuses/Orbits: No acute finding. Other: None. IMPRESSION: 1. No acute intracranial abnormality. 2. Moderate brain parenchymal atrophy and chronic microvascular disease. Electronically Signed   By: Fidela Salisbury M.D.   On: 09/30/2020 14:21   CT CHEST WO CONTRAST  Result Date: 09/30/2020 CLINICAL DATA:  Fall EXAM: CT CHEST WITHOUT CONTRAST TECHNIQUE: Multidetector CT imaging of the chest was performed following the standard protocol without IV contrast. COMPARISON:  Prior CT scan of the chest 04/30/2015 FINDINGS: Cardiovascular: Limited evaluation in the absence of intravenous contrast. No evidence of aortic aneurysm. Scattered atherosclerotic plaque throughout the abdominal aorta. Mild cardiomegaly. Extensive coronary artery atherosclerotic calcifications. Surgical changes of prior aortic valve replacement. No pericardial effusion. Mediastinum/Nodes: Unremarkable CT appearance of the thyroid gland. No suspicious mediastinal or hilar adenopathy. No soft tissue mediastinal mass. The thoracic esophagus is unremarkable. Lungs/Pleura: Small left and moderate right layering pleural effusions. Diffuse chronic bronchial wall thickening. Atelectasis present in both lower lobes. Mild interlobular septal thickening in the apices and bases. No suspicious pulmonary mass or nodule. Upper Abdomen: No acute abnormality. Musculoskeletal: No chest wall mass or suspicious bone lesions identified. Mild dextroconvex scoliosis. IMPRESSION: 1. No evidence of acute injury to the thorax. 2. Cardiomegaly with extensive coronary artery calcifications and surgical changes of prior aortic valve replacement. 3. Moderate right and  small left layering pleural effusions. Combined with trace interstitial pulmonary edema, findings suggest mild or chronic CHF. 4. Diffuse chronic bronchial wall thickening bilaterally. Aortic Atherosclerosis (ICD10-I70.0). Electronically Signed   By: Jacqulynn Cadet M.D.   On: 09/30/2020 14:31   CT CERVICAL SPINE WO CONTRAST  Result Date: 09/30/2020 CLINICAL DATA:  Post fall.  Neck trauma. EXAM: CT CERVICAL SPINE WITHOUT CONTRAST TECHNIQUE: Multidetector CT imaging of the cervical spine was performed without intravenous contrast. Multiplanar CT image reconstructions were also generated. COMPARISON:  September 29, 2020 FINDINGS: Alignment: Normal. Skull base and vertebrae: No acute fracture. No primary bone lesion or focal pathologic process. Soft tissues and spinal canal: No prevertebral fluid or swelling. No visible canal hematoma. Disc levels:  Multilevel osteoarthritic changes. Upper chest: Negative. Other: None. IMPRESSION: 1. No evidence of acute traumatic injury to the cervical spine. 2. Multilevel osteoarthritic changes of the cervical spine. Electronically Signed   By: Fidela Salisbury M.D.   On: 09/30/2020 14:26   CT ABDOMEN PELVIS W  CONTRAST  Result Date: 09/30/2020 CLINICAL DATA:  Fall EXAM: CT ABDOMEN AND PELVIS WITH CONTRAST TECHNIQUE: Multidetector CT imaging of the abdomen and pelvis was performed using the standard protocol following bolus administration of intravenous contrast. CONTRAST:  55m OMNIPAQUE IOHEXOL 350 MG/ML SOLN COMPARISON:  None. FINDINGS: Lower chest: See concurrently obtained but separately dictated CT scan of the chest. Hepatobiliary: No focal liver abnormality is seen. No gallstones, gallbladder wall thickening, or biliary dilatation. Pancreas: Unremarkable. No pancreatic ductal dilatation or surrounding inflammatory changes. Spleen: Normal in size without focal abnormality. Adrenals/Urinary Tract: Unremarkable adrenal glands. Renal cortical atrophy bilaterally.  Nonobstructing bilateral nephrolithiasis. No enhancing renal mass. No ureteral abnormalities. The bladder is within normal limits. Stomach/Bowel: Scattered colonic diverticula without evidence of active inflammation. Normal appendix. No focal bowel wall thickening or evidence of obstruction. Vascular/Lymphatic: Atherosclerotic calcifications throughout the abdominal aorta without evidence of aneurysm. No suspicious lymphadenopathy. Reproductive: Prostate is unremarkable. Other: No abdominal wall hernia or abnormality. No abdominopelvic ascites. Musculoskeletal: No acute or significant osseous findings. Bilateral lower lumbar facet arthropathy. IMPRESSION: 1. No acute abnormality or evidence of injury in the abdomen or pelvis. 2. Bilateral nonobstructing nephrolithiasis. 3.  Aortic Atherosclerosis (ICD10-I70.0). 4. Lower lumbar facet arthropathy. 5. Scattered colonic diverticula without evidence of active inflammation. Electronically Signed   By: HJacqulynn CadetM.D.   On: 09/30/2020 14:25   CT L-SPINE NO CHARGE  Result Date: 09/30/2020 CLINICAL DATA:  Fall W19.XXXA (ICD-10-CM) EXAM: CT LUMBAR SPINE WITHOUT CONTRAST TECHNIQUE: Multidetector CT imaging of the lumbar spine was performed without intravenous contrast administration. Multiplanar CT image reconstructions were also generated. COMPARISON:  None. FINDINGS: Segmentation: 5 non rib-bearing lumbar vertebral bodies. Alignment: No substantial sagittal subluxation. Mild broad levocurvature. Vertebrae: Vertebral body heights are maintained. No evidence of acute fracture. Diffuse osteopenia. Paraspinal and other soft tissues: Please see concurrent CT abdomen/pelvis for intra-abdominal evaluation. Disc levels: Broad disc bulge at L4-L5 with at least mild canal stenosis. Severe lower lumbar facet arthropathy. Probable foraminal stenosis on the left at L4-L5 and L5-S1. IMPRESSION: 1. No evidence of acute fracture or traumatic malalignment. 2. Severe lower lumbar  facet arthropathy with likely at least mild canal stenosis at L4-L5 and probable left foraminal stenosis at L4-L5 and L5-S1. An MRI of the lumbar spine could better characterize the canal and foramina if clinically indicated. 3. Osteopenia. Electronically Signed   By: FMargaretha SheffieldM.D.   On: 09/30/2020 13:58   DG Chest Port 1 View  Result Date: 09/30/2020 CLINICAL DATA:  Unwitnessed fall. EXAM: PORTABLE CHEST 1 VIEW COMPARISON:  09/29/2020 FINDINGS: Stable mildly enlarged cardiac silhouette, sternal wires and prosthetic aortic valve. Interval patchy opacity in both lungs, most pronounced in the lower lung zones. No visible pleural fluid. No fracture or pneumothorax seen. Thoracic spine degenerative changes. IMPRESSION: Interval patchy bilateral atelectasis or pneumonia. Pulmonary contusions are also a possibility. Electronically Signed   By: SClaudie ReveringM.D.   On: 09/30/2020 12:09   DG Swallowing Func-Speech Pathology  Result Date: 10/01/2020 Table formatting from the original result was not included. Objective Swallowing Evaluation: Type of Study: MBS-Modified Barium Swallow Study  Patient Details Name: MHEZAKIAH PALADINOMRN: [id number] of Birth: [birth year]Today's Date: 10/01/2020 Time: SLP Start Time (ACUTE ONLY): [id number] Stop Time (ACUTE ONLY): [id number] Time Calculation (min) (ACUTE ONLY): 20 min Past Medical History: Past Medical History: Diagnosis Date  BPH (benign prostatic hyperplasia)   Coronary artery disease with hx of myocardial infarct w/o hx of CABG 2010  Dehydration  Diverticulosis   HOH (hard of hearing)   hearing aids  Hyperlipidemia   Hypertension   Impaired fasting glucose   Memory difficulty 10/19/2012  Memory loss   Mesenteric panniculitis ([place])   Retinal tear   Severe aortic stenosis 2010  S/P AVR-TISSUE VALVE Past Surgical History: Past Surgical History: Procedure Laterality Date  2 VESSEL CABG    AORTIC VALVE REPLACEMENT    BARTLE    CATARACT EXTRACTION, BILATERAL    RT 2010  LT 2014  TONSILLECTOMY   HPI: Patient is a 87 y.o. male with PMH: dementia, CAD s/p CABG in 2010, AS s/p AVR, HTN and BPH returning to ED after recurrent fall. In ED, vitals WNL except slightly elevated BP, CXR revealed atelectasis/possible pneumonia/possible contusion, CT head and spine without acute finding, CTA chest showed cardiomegaly, moderate right and small left pleural effusion.  Subjective: pleasant, cooperative Assessment / Plan / Recommendation CHL IP CLINICAL IMPRESSIONS 10/01/2020 Clinical Impression Patient presents with a mild-moderate oropharyngeal dysphagia with a suspected cervical esophageal component as well. Patient exhibited oral delays with all tested boluses and when taking straw sips of thin liquids or nectar thick liquids, would appear to fill up oral cavity and hold liquid until fairly full prior to swallow. Premature spillage from oral cavity into vallecular sinus was observed with thin liquids. Patient exhibited piecemeal swallowing with puree solids. During pharyngeal phase, patient exhibited trace penetration (PAS 3) during the swallow with thin liquids via straw sips but no aspiration. He did exhibit aspiration of trace amount (PAS 8) during and after the swallow when consuming thin liquids via cup sips. SLP suspects patients awareness to liquids during cup sips was poor leading to aspiration, however improved when using straw sips. No penetration or aspiration observed with straw sips of nectar thick liquids. Puree solids resulted in trace to mild vallecular and pyriform sinus residuals as well as reduced pharyngeal transit. Trace residuals observed in pharynx in vallecular and pyriform sinuses with thin liquids and nectar thick liquids. Patient exhibited reduced UES relaxation with retention of barium just above UES observed. In addition, SLP observed presence of likely cervical osteophyte (no radiologist present to confirm). SLP is recommending Dys 1, nectar thick liquids but allow  thin liquids in between meals with straw only (no cup sips). SLP Visit Diagnosis -- Attention and concentration deficit following -- Frontal lobe and executive function deficit following -- Impact on safety and function --   CHL IP TREATMENT RECOMMENDATION 10/01/2020 Treatment Recommendations Therapy as outlined in treatment plan below   Prognosis 10/01/2020 Prognosis for Safe Diet Advancement Fair Barriers to Reach Goals Cognitive deficits Barriers/Prognosis Comment patient with h/o dementia CHL IP DIET RECOMMENDATION 10/01/2020 SLP Diet Recommendations Dysphagia 1 (Puree) solids;Nectar thick liquid;Other (Comment) Liquid Administration via Straw;Other (Comment) Medication Administration Crushed with puree Compensations Minimize environmental distractions;Small sips/bites;Slow rate Postural Changes --   CHL IP OTHER RECOMMENDATIONS 10/01/2020 Recommended Consults -- Oral Care Recommendations Oral care BID;Staff/trained caregiver to provide oral care Other Recommendations Order thickener from pharmacy;Prohibited food (jello, ice cream, thin soups);Remove water pitcher;Clarify dietary restrictions   CHL IP FOLLOW UP RECOMMENDATIONS 10/01/2020 Follow up Recommendations 24 hour supervision/assistance;Skilled Nursing facility   [facility] IP FREQUENCY AND DURATION 10/01/2020 Speech Therapy Frequency (ACUTE ONLY) min 1 x/week Treatment Duration 1 week      CHL IP ORAL PHASE 10/01/2020 Oral Phase Impaired Oral - Pudding Teaspoon -- Oral - Pudding Cup -- Oral - Honey Teaspoon -- Oral - Honey Cup -- Oral - Nectar Teaspoon -- Oral -  Nectar Cup -- Oral - Nectar Straw Weak lingual manipulation;Delayed oral transit;Piecemeal swallowing;Reduced posterior propulsion Oral - Thin Teaspoon -- Oral - Thin Cup Weak lingual manipulation;Holding of bolus;Piecemeal swallowing;Delayed oral transit;Reduced posterior propulsion;Premature spillage Oral - Thin Straw Reduced posterior propulsion;Delayed oral transit;Weak lingual manipulation;Holding of  bolus;Premature spillage Oral - Puree Weak lingual manipulation;Reduced posterior propulsion;Delayed oral transit;Piecemeal swallowing Oral - Mech Soft -- Oral - Regular -- Oral - Multi-Consistency -- Oral - Pill Weak lingual manipulation;Reduced posterior propulsion;Delayed oral transit Oral Phase - Comment --  CHL IP PHARYNGEAL PHASE 10/01/2020 Pharyngeal Phase Impaired Pharyngeal- Pudding Teaspoon -- Pharyngeal -- Pharyngeal- Pudding Cup -- Pharyngeal -- Pharyngeal- Honey Teaspoon -- Pharyngeal -- Pharyngeal- Honey Cup -- Pharyngeal -- Pharyngeal- Nectar Teaspoon -- Pharyngeal -- Pharyngeal- Nectar Cup -- Pharyngeal -- Pharyngeal- Nectar Straw Pharyngeal residue - valleculae;Pharyngeal residue - pyriform;Reduced anterior laryngeal mobility Pharyngeal -- Pharyngeal- Thin Teaspoon -- Pharyngeal -- Pharyngeal- Thin Cup Reduced anterior laryngeal mobility;Penetration/Aspiration during swallow;Penetration/Apiration after swallow;Trace aspiration;Pharyngeal residue - valleculae;Pharyngeal residue - pyriform Pharyngeal Material enters airway, passes BELOW cords without attempt by patient to eject out (silent aspiration);Material enters airway, CONTACTS cords and not ejected out Pharyngeal- Thin Straw Delayed swallow initiation-vallecula;Penetration/Aspiration during swallow;Pharyngeal residue - valleculae;Pharyngeal residue - pyriform;Reduced anterior laryngeal mobility;Reduced airway/laryngeal closure Pharyngeal Material enters airway, remains ABOVE vocal cords and not ejected out Pharyngeal- Puree Delayed swallow initiation-vallecula;Pharyngeal residue - pyriform;Pharyngeal residue - valleculae;Reduced anterior laryngeal mobility;Reduced pharyngeal peristalsis Pharyngeal -- Pharyngeal- Mechanical Soft -- Pharyngeal -- Pharyngeal- Regular -- Pharyngeal -- Pharyngeal- Multi-consistency -- Pharyngeal -- Pharyngeal- Pill Delayed swallow initiation-vallecula;Pharyngeal residue - pyriform;Pharyngeal residue -  valleculae;Reduced pharyngeal peristalsis Pharyngeal -- Pharyngeal Comment --  CHL IP CERVICAL ESOPHAGEAL PHASE 10/01/2020 Cervical Esophageal Phase Impaired Pudding Teaspoon -- Pudding Cup -- Honey Teaspoon -- Honey Cup -- Nectar Teaspoon -- Nectar Cup -- Nectar Straw Reduced cricopharyngeal relaxation Thin Teaspoon -- Thin Cup Reduced cricopharyngeal relaxation Thin Straw Reduced cricopharyngeal relaxation Puree Reduced cricopharyngeal relaxation Mechanical Soft -- Regular -- Multi-consistency -- Pill Reduced cricopharyngeal relaxation Cervical Esophageal Comment -- Sonia Baller, MA, CCC-SLP Speech Therapy             ECHOCARDIOGRAM COMPLETE  Result Date: 10/01/2020    ECHOCARDIOGRAM REPORT   Patient Name:   [name] Date of Exam: 10/01/2020 Medical Rec #:  [id number]        Height:       72.0 in Accession #:    [id number]       Weight:       147.5 lb Date of Birth:  [birth year]        BSA:          1.872 m Patient Age:    36 years         BP:           142/62 mmHg Patient Gender: M                HR:           69 bpm. Exam Location:  Inpatient Procedure: 2D Echo, 3D Echo, Color Doppler and Cardiac Doppler Indications:    CHF-Acute Diastolic [id number]  History:        Patient has prior history of Echocardiogram examinations, most                 recent 01/11/2020. CAD, Prior CABG; Risk Factors:Hypertension                 and Dyslipidemia.  Aortic Valve: 23 mm Edwards pericardial valve is present in the                 aortic position. Procedure Date: 05/21/2010.  Sonographer:    Darlina Sicilian RDCS Referring Phys: [id number] Charlesetta Ivory GONFA IMPRESSIONS  1. Left ventricular ejection fraction, by estimation, is 60 to 65%. The left ventricle has normal function. The left ventricle has no regional wall motion abnormalities. Left ventricular diastolic parameters are consistent with Grade II diastolic dysfunction (pseudonormalization). Elevated left ventricular end-diastolic pressure. The E/e' is 60.   2. Right ventricular systolic function is mildly reduced. The right ventricular size is normal.  3. Left atrial size was mildly dilated.  4. Right atrial size was mildly dilated.  5. The mitral valve is grossly normal. Mild mitral valve regurgitation.  6. The aortic valve has been repaired/replaced. Aortic valve regurgitation is not visualized. Mild aortic valve sclerosis is present, with no evidence of aortic valve stenosis. There is a 23 mm Edwards pericardial valve present in the aortic position. Procedure Date: 05/21/2010. Aortic valve mean gradient measures 11.0 mmHg. DI is 0.31. EROA is 1.31 cm2. Findings suggest mild prosthetic valve stenosis.  7. The inferior vena cava is normal in size with <50% respiratory variability, suggesting right atrial pressure of 8 mmHg. Comparison(s): 01/11/2020: LVEF 60-65%, bioprosthetic AVR - mean gradient 10 mmHg, DI 0.55. FINDINGS  Left Ventricle: Left ventricular ejection fraction, by estimation, is 60 to 65%. The left ventricle has normal function. The left ventricle has no regional wall motion abnormalities. The left ventricular internal cavity size was normal in size. There is  no left ventricular hypertrophy. Abnormal (paradoxical) septal motion consistent with post-operative status. Left ventricular diastolic parameters are consistent with Grade II diastolic dysfunction (pseudonormalization). Elevated left ventricular end-diastolic pressure. The E/e' is 48. Right Ventricle: The right ventricular size is normal. No increase in right ventricular wall thickness. Right ventricular systolic function is mildly reduced. Left Atrium: Left atrial size was mildly dilated. Right Atrium: Right atrial size was mildly dilated. Pericardium: There is no evidence of pericardial effusion. Mitral Valve: The mitral valve is grossly normal. Mild to moderate mitral annular calcification. Mild mitral valve regurgitation. Tricuspid Valve: The tricuspid valve is grossly normal. Tricuspid valve  regurgitation is trivial. Aortic Valve: The aortic valve has been repaired/replaced. Aortic valve regurgitation is not visualized. Mild aortic valve sclerosis is present, with no evidence of aortic valve stenosis. Aortic valve mean gradient measures 11.0 mmHg. Aortic valve peak gradient measures 17.1 mmHg. Aortic valve area, by VTI measures 1.31 cm. There is a 23 mm Edwards pericardial valve present in the aortic position. Procedure Date: 05/21/2010. Pulmonic Valve: The pulmonic valve was grossly normal. Pulmonic valve regurgitation is trivial. Aorta: The aortic root and ascending aorta are structurally normal, with no evidence of dilitation. Venous: The inferior vena cava is normal in size with less than 50% respiratory variability, suggesting right atrial pressure of 8 mmHg. IAS/Shunts: No atrial level shunt detected by color flow Doppler.  LEFT VENTRICLE PLAX 2D LVIDd:         5.30 cm  Diastology LVIDs:         4.30 cm  LV e' medial:    3.77 cm/s LV PW:         0.90 cm  LV E/e' medial:  34.5 LV IVS:        0.90 cm  LV e' lateral:   6.25 cm/s LVOT diam:     2.30 cm  LV E/e'  lateral: 20.8 LV SV:         64 LV SV Index:   34 LVOT Area:     4.15 cm  RIGHT VENTRICLE RV S prime:     9.81 cm/s TAPSE (M-mode): 1.9 cm LEFT ATRIUM             Index       RIGHT ATRIUM           Index LA diam:        4.90 cm 2.62 cm/m  RA Area:     22.30 cm LA Vol (A2C):   64.7 ml 34.56 ml/m RA Volume:   63.80 ml  34.08 ml/m LA Vol (A4C):   61.0 ml 32.56 ml/m LA Biplane Vol: 66.5 ml 35.53 ml/m  AORTIC VALVE                    PULMONIC VALVE AV Area (Vmax):    1.37 cm     PV Vmax:          2.17 m/s AV Area (Vmean):   1.25 cm     PV Peak grad:     18.8 mmHg AV Area (VTI):     1.31 cm     PR End Diast Vel: 7.95 msec AV Vmax:           207.00 cm/s AV Vmean:          156.000 cm/s AV VTI:            0.487 m AV Peak Grad:      17.1 mmHg AV Mean Grad:      11.0 mmHg LVOT Vmax:         68.50 cm/s LVOT Vmean:        47.100 cm/s LVOT VTI:           0.153 m LVOT/AV VTI ratio: 0.31  AORTA Ao Root diam: 2.80 cm Ao Asc diam:  3.30 cm MITRAL VALVE MV Area (PHT): 4.39 cm     SHUNTS MV Decel Time: 173 msec     Systemic VTI:  0.15 m MV E velocity: 130.00 cm/s  Systemic Diam: 2.30 cm MV A velocity: 53.30 cm/s MV E/A ratio:  2.44 Lyman Bishop MD Electronically signed by Lyman Bishop MD Signature Date/Time: 10/01/2020/3:22:44 PM    Final    CT Angio Chest/Abd/Pel for Dissection W and/or Wo Contrast  Result Date: 09/30/2020 CLINICAL DATA:  Abdominal pain. Elevated troponin. Clinical concern for aortic dissection. EXAM: CT ANGIOGRAPHY CHEST, ABDOMEN AND PELVIS TECHNIQUE: Non-contrast CT of the chest was initially obtained. Multidetector CT imaging through the chest, abdomen and pelvis was performed using the standard protocol during bolus administration of intravenous contrast. Multiplanar reconstructed images and MIPs were obtained and reviewed to evaluate the vascular anatomy. CONTRAST:  37m OMNIPAQUE IOHEXOL 350 MG/ML SOLN COMPARISON:  Chest CT obtained without contrast earlier today and abdomen and pelvis CT obtained with contrast earlier today. FINDINGS: The images are limited by photon starvation and streak artifacts produced by the patient's arms not being raised above his head. CTA CHEST FINDINGS Cardiovascular: Median sternotomy wires, post CABG changes and prosthetic aortic valve. Aortic and coronary artery calcifications. No aortic aneurysm or dissection. Mildly enlarged heart with left ventricular and biatrial enlargement. Mediastinum/Nodes: No enlarged mediastinal, hilar, or axillary lymph nodes. Thyroid gland, trachea, and esophagus demonstrate no significant findings. Lungs/Pleura: Stable moderate right and small left pleural effusions, prominent pulmonary vasculature, mildly prominent interstitial markings, diffuse peribronchial thickening and mild  bilateral dependent atelectasis. Musculoskeletal: Thoracic and lower cervical spine degenerative  changes, including changes of DISH. Review of the MIP images confirms the above findings. CTA ABDOMEN AND PELVIS FINDINGS VASCULAR Aorta: Extensive calcified and noncalcified mural plaque formation in the abdominal aorta, most pronounced in the infrarenal region. In that area, this is causing approximately 50% luminal narrowing. No aneurysm or acute dissection seen. Celiac: Calcified and noncalcified plaque at the origin producing greater than 90% stenosis. Normally opacified distally. SMA: Calcified plaque formation at the origin and proximally, producing approximately 50% stenosis. Renals: Dense calcified plaque formation at the origins causing approximately 90% or greater stenosis bilaterally. IMA: Noncalcified plaque at the origin producing approximately 90% stenosis. Normally opacified distally. Inflow: Bilateral common iliac calcified plaque formation causing about 50% luminal stenosis on the left and 20% luminal stenosis on the right. There is also mild calcified plaque formation involving the internal iliac arteries bilaterally. The external iliac arteries are unremarkable. There is some calcified plaque involving both common femoral arteries without significant luminal stenosis. Veins: No obvious venous abnormality within the limitations of this arterial phase study. Review of the MIP images confirms the above findings. NON-VASCULAR Hepatobiliary: No focal liver abnormality is seen. No gallstones, gallbladder wall thickening, or biliary dilatation. Pancreas: Unremarkable. No pancreatic ductal dilatation or surrounding inflammatory changes. Spleen: Mildly prominent without abnormal enlargement. Adrenals/Urinary Tract: Normal appearing adrenal glands. Small bilateral renal calculi. Excreted contrast in the urinary bladder. Unremarkable ureters. Stomach/Bowel: Multiple colonic diverticula without evidence of diverticulitis. Normal appearing appendix. Unremarkable small bowel and stomach. Lymphatic: No enlarged  lymph nodes. Reproductive: Moderately enlarged prostate gland. Other: Small to moderate-sized left inguinal hernia containing fat and small right inguinal hernia containing fat. Musculoskeletal: Lumbar spine degenerative changes. Review of the MIP images confirms the above findings. IMPRESSION: 1. No aortic aneurysm or dissection. 2. Extensive arterial atheromatous changes causing approximately 50% luminal stenosis of the infrarenal abdominal aorta, greater than 90% stenosis of the proximal celiac axis, approximately 50% stenosis of the proximal SMA, approximately 90% or greater stenosis of the renal arteries bilaterally and approximately 90% stenosis of the proximal IMA. 3. Stable changes of congestive heart failure including bilateral pleural effusions, interstitial pulmonary edema, pulmonary vascular congestion and bronchitic changes. 4. Colonic diverticulosis. 5. Bilateral nonobstructing renal calculi. Electronically Signed   By: Claudie Revering M.D.   On: 09/30/2020 17:36    Cardiac Studies    2D echo 09/2020 IMPRESSIONS    1. Left ventricular ejection fraction, by estimation, is 60 to 65%. The  left ventricle has normal function. The left ventricle has no regional  wall motion abnormalities. Left ventricular diastolic parameters are  consistent with Grade II diastolic  dysfunction (pseudonormalization). Elevated left ventricular end-diastolic  pressure. The E/e' is 68.   2. Right ventricular systolic function is mildly reduced. The right  ventricular size is normal.   3. Left atrial size was mildly dilated.   4. Right atrial size was mildly dilated.   5. The mitral valve is grossly normal. Mild mitral valve regurgitation.   6. The aortic valve has been repaired/replaced. Aortic valve  regurgitation is not visualized. Mild aortic valve sclerosis is present,  with no evidence of aortic valve stenosis. There is a 23 mm Edwards  pericardial valve present in the aortic position.  Procedure Date:  05/21/2010. Aortic valve mean gradient measures 11.0 mmHg.  DI is 0.31. EROA is 1.31 cm2. Findings suggest mild prosthetic valve  stenosis.   7. The inferior vena cava is normal in  size with <50% respiratory  variability, suggesting right atrial pressure of 8 mmHg.   Comparison(s): 01/11/2020: LVEF 60-65%, bioprosthetic AVR - mean gradient  10 mmHg, DI 0.55.   2D echo 01/11/2020 IMPRESSIONS    1. Left ventricular ejection fraction, by estimation, is 60 to 65%. The  left ventricle has normal function. The left ventricle has no regional  wall motion abnormalities. Left ventricular diastolic parameters were  normal.   2. Right ventricular systolic function is normal. The right ventricular  size is normal.   3. Left atrial size was moderately dilated.   4. The mitral valve is degenerative. Mild mitral valve regurgitation. No  evidence of mitral stenosis. Moderate mitral annular calcification.   5. Post AVR with bioprosthetic valve type unknown No PVL, normal DVI 0.55  and mean gradient 10 mmHg. The aortic valve has been repaired/replaced.  Aortic valve regurgitation is not visualized. No aortic stenosis is  present.   6. The inferior vena cava is normal in size with greater than 50%  respiratory variability, suggesting right atrial pressure of 3 mmHg.   Patient Profile     87 y.o. male with a hx of AVR CABG, HTN   who is being seen 09/30/2020 for the evaluation of fall with + troponin  at the request of Dr ER MD.  Assessment & Plan    Elevated Troponin -this occurred in setting of fall where he spent all night on the floor, moderate renal insuff and severe Leukocytosis  -suspect that Trop related to demand ischemia in setting of worsening renal function, known CAD, acute CHF and rhabo from fall -BNP is elevated at 2356 with Chest CTA showing bilateral pleural effusions, interstitial pulmonary edema and pulmonary vascular congestion -2D echo shows normal LVF with elevated filling  pressures -Given normal LVF with no focal RWMAs in setting of hsTrop of 2850> trop bump is not c/w ACS and likely related to demand ischemia and rhabdo -No further ischemic workup recommended given his advanced age and CKD and lack of anginal sx  PAD -Chest CTA showed extensive arterial atheromatous changes with 50% infrarenal AA luminal stenosis, >90% stenosis of pCeliac axis, 50% pSMA, 90% bilateral RAS and 90% IMA stenosis -LFTS are ok so statin restarted   CAD -s/p remote CABG in 2010 -he has not had any angina recently -hsTrop elevation in setting of normal LVF is not consistent with ACS -no further ischemic workup given normal LVF, advanced age and lack of anginal sx -no BB due to hx of bradycardia -continue ASA '81mg'$  daily and restarted statin  HTN -BP elevated today -home dose of amlodipine '5mg'$  daily restarted  Acute diastolic CHF -BNP elevated at 2356 -2D echo showed normal LVF with G2DD and elevated filling pressures -diuretics on hold due to AKI on CKD and minimal edema on exam -Renal function improving after holding duretics -he does not appear volume overloaded on exam today -he is net neg 2.6L since admit -he was on Lasix  '40mg'$  daily at home due to chronic LE edema -recommend TED compression hose -restart Lasix at '20mg'$  daily on discharge with BMET in 1 week     I have spent a total of 35 minutes with patient reviewing 2D echo Dec 2021 and this admission, telemetry, EKGs, labs and examining patient as well as establishing an assessment and plan that was discussed with the patient.  > 50% of time was spent in direct patient care.     [facility] will sign off.   Medication  Recommendations:  Amlodipine '5mg'$  daily, ASA '81mg'$  daily, Atorvastatin '20mg'$  daily and Lasix '20mg'$  daily Other recommendations (labs, testing, etc):  BMET 1 week Follow up as an outpatient:  Followup with Dr. Irish Lack or extender in 2 weeks   For questions or updates, please contact [place] Please consult [email or web address] for contact info under        Signed, Fransico Him, MD  10/02/2020, 10:38 AM

## 2020-10-02 NOTE — Evaluation (Signed)
Occupational Therapy Evaluation Patient Details Name: [name] MRN: [id number] DOB: 06/11/1926 Today's Date: 10/02/2020   History of Present Illness Patient is a 87 year old male who presented to the hosptial after a fall at home. patient was found to have NSTEMI and CHF. PMH: dementia, memory loss, HTN, aortic valve replacement   Clinical Impression   Patient is an 87 year old male who was admitted for above.  Patient indicated living at home alone and then later in conversation living at home with wife. Currently, patient is mod A for bed mobility, and mod-max A for ADL tasks. Patient was noted to have a declined in problem solving, decreased functional activity tolerance, decreased standing balance, decreased endurance, and impaired memory impacting ability to engage in ADL tasks. Patient would continue to benefit from skilled OT services at this time while admitted and after d/c to address noted deficits in order to improve overall safety and independence in ADLs.       Recommendations for follow up therapy are one component of a multi-disciplinary discharge planning process, led by the attending physician.  Recommendations may be updated based on patient status, additional functional criteria and insurance authorization.   Follow Up Recommendations  SNF (pending progress and level of care available at home)    Equipment Recommendations  None recommended by OT    Recommendations for Other Services       Precautions / Restrictions Precautions Precautions: Fall Restrictions Weight Bearing Restrictions: No      Mobility Bed Mobility Overal bed mobility: Needs Assistance Bed Mobility: Supine to Sit     Supine to sit: Mod assist;HOB elevated     General bed mobility comments: patient required assistance for BLE and to move trunk into upright posture on edge of bed. patient indicated feeling dizziness sitting EOB    Transfers Overall transfer level: Needs  assistance Equipment used: Rolling walker (2 wheeled) Transfers: Risk manager;Sit to/from Stand Sit to Stand: Min assist;+2 safety/equipment Stand pivot transfers: Min assist;+2 safety/equipment            Balance Overall balance assessment: Needs assistance Sitting-balance support: Feet supported;Single extremity supported Sitting balance-Leahy Scale: Fair     Standing balance support: Bilateral upper extremity supported Standing balance-Leahy Scale: Poor                             ADL either performed or assessed with clinical judgement   ADL Overall ADL's : Needs assistance/impaired Eating/Feeding: Set up;Sitting   Grooming: Wash/dry face;Wash/dry hands;Sitting;Set up Grooming Details (indicate cue type and reason): with increased time to participate in task. Upper Body Bathing: Minimal assistance;Sitting   Lower Body Bathing: Sitting/lateral leans;Maximal assistance   Upper Body Dressing : Sitting;Minimal assistance;Set up   Lower Body Dressing: Moderate assistance;Sitting/lateral leans;Cueing for safety Lower Body Dressing Details (indicate cue type and reason): patient was asked to don socks sitting in bed with patient unable to problem solve how to don socks without therapist starting sock first. patient made moves to get foot closer to lap to get sock but patient attempted to place sock on bottom of foot without attempting to open sock up to place foot inside with repeat education on donning socks. Toilet Transfer: Minimal assistance;+2 for safety/equipment;RW Toilet Transfer Details (indicate cue type and reason): patient transfered from edge of bed to recliner in room with patient reporting he did not need to use restroom at this time. Toileting- Clothing Manipulation and  Hygiene: Maximal assistance;Sit to/from stand       Functional mobility during ADLs: Minimal assistance;+2 for safety/equipment;Rolling walker       Vision Baseline  Vision/History: 1 Wears glasses Ability to See in Adequate Light: 1 Impaired Additional Comments: patient attempted to remove glassess from face when no glasses were present to wash face     Perception     Praxis      Pertinent Vitals/Pain Pain Assessment: No/denies pain     Hand Dominance Right   Extremity/Trunk Assessment Upper Extremity Assessment Upper Extremity Assessment: Overall WFL for tasks assessed   Lower Extremity Assessment Lower Extremity Assessment: Defer to PT evaluation   Cervical / Trunk Assessment Cervical / Trunk Assessment: Kyphotic   Communication Communication Communication: No difficulties   Cognition Arousal/Alertness: Awake/alert Behavior During Therapy: WFL for tasks assessed/performed Overall Cognitive Status: Within Functional Limits for tasks assessed                                 General Comments: patient was noted to have some moments of memory loss during session with patient reporting he lived alone then reporting he lives with wife and cares for her later in conversation. will need to follow up with family for more information.   General Comments       Exercises     Shoulder Instructions      Home Living Family/patient expects to be discharged to:: Assisted living                             Home Equipment: None          Prior Functioning/Environment Level of Independence: Independent        Comments: patient reported being caretaker for wife at home who is bed bound per patient report. hospital notes state son is looking at ALF for parents        OT Problem List: Decreased strength;Decreased activity tolerance;Impaired balance (sitting and/or standing);Decreased safety awareness;Decreased knowledge of use of DME or AE      OT Treatment/Interventions: Self-care/ADL training;Therapeutic exercise;Energy conservation;DME and/or AE instruction;Therapeutic activities;Balance  training;Patient/family education    OT Goals(Current goals can be found in the care plan section) Acute Rehab OT Goals Patient Stated Goal: to get out of bed OT Goal Formulation: With patient Time For Goal Achievement: 10/16/20 Potential to Achieve Goals: Good  OT Frequency: Min 2X/week   Barriers to D/C: Decreased caregiver support  patient indicated living home alone       Co-evaluation              AM-PAC OT "6 Clicks" Daily Activity     Outcome Measure Help from another person eating meals?: A Little Help from another person taking care of personal grooming?: A Little Help from another person toileting, which includes using toliet, bedpan, or urinal?: A Lot Help from another person bathing (including washing, rinsing, drying)?: A Lot Help from another person to put on and taking off regular upper body clothing?: A Little Help from another person to put on and taking off regular lower body clothing?: A Lot 6 Click Score: 15   End of Session Equipment Utilized During Treatment: Gait belt;Rolling walker Nurse Communication: Mobility status;Other (comment) (problem solving and memory concerns)  Activity Tolerance: Patient tolerated treatment well Patient left: in chair;with call bell/phone within reach;with chair alarm set  OT Visit Diagnosis: Unsteadiness  on feet (R26.81);Muscle weakness (generalized) (M62.81);History of falling (Z91.81)                Time: [id number] OT Time Calculation (min): 28 min Charges:  OT General Charges $OT Visit: 1 Visit OT Evaluation $OT Eval Moderate Complexity: 1 Mod OT Treatments $Self Care/Home Management : 8-22 mins  Jackelyn Poling OTR/L, MS Acute Rehabilitation Department Office# [phone] Pager# [phone]   [place] 10/02/2020, 10:09 AM

## 2020-10-02 NOTE — Progress Notes (Signed)
Physical Therapy Treatment Patient Details Name: [name] MRN: [id number] DOB: 11-May-1926 Today's Date: 10/02/2020   History of Present Illness 87 yo male admitted with CHF, elevated troponin after falling at home. PMH: dementia, HTN, aortic valve replacement, CAD, CABG, falls, aortic stenosis    PT Comments    Progressing with mobility. Min-Mod A for mobility on today. Pt remains at risk for falls. Per TOC note, plan is for pt to return to Ind Living.     Recommendations for follow up therapy are one component of a multi-disciplinary discharge planning process, led by the attending physician.  Recommendations may be updated based on patient status, additional functional criteria and insurance authorization.  Follow Up Recommendations  Home health PT;Supervision/Assistance - 24 hour (pt/family decline placement/prefer back to Ind Living)     Equipment Recommendations  None recommended by PT    Recommendations for Other Services       Precautions / Restrictions Precautions Precautions: Fall Restrictions Weight Bearing Restrictions: No     Mobility  Bed Mobility Overal bed mobility: Needs Assistance Bed Mobility: Supine to Sit     Supine to sit: Mod assist;HOB elevated Sit to supine: Min assist   General bed mobility comments: Increased time. Multimodal cueing. Assist for trunk.    Transfers Overall transfer level: Needs assistance Equipment used: Rolling walker (2 wheeled) Transfers: Sit to/from Stand Sit to Stand: Min assist;From elevated surface         General transfer comment: Assist to power up, steady. Cues for technique, hand placement.  Ambulation/Gait Ambulation/Gait assistance: Min assist Gait Distance (Feet): 50 Feet Assistive device: Rolling walker (2 wheeled) Gait Pattern/deviations: Step-through pattern;Decreased stride length     General Gait Details: Assist to stabilize pt and to manage RW safely. Shuffling pattern at times,  especially with turns   Marine scientist [place] (Stroke Patients Only)       Balance Overall balance assessment: Needs assistance;History of Falls         Standing balance support: Bilateral upper extremity supported Standing balance-Leahy Scale: Poor                              Cognition Arousal/Alertness: Awake/alert Behavior During Therapy: WFL for tasks assessed/performed Overall Cognitive Status: History of cognitive impairments - at baseline                                        Exercises Total Joint Exercises Long Arc Quad: AROM;Both;10 reps;Seated Marching in Standing: AROM;Both;10 reps    General Comments        Pertinent Vitals/Pain Pain Assessment: Faces Faces Pain Scale: Hurts little more Pain Location: back Pain Descriptors / Indicators: Discomfort;Sore Pain Intervention(s): Limited activity within patient's tolerance;Monitored during session;Repositioned    Home Living Family/patient expects to be discharged to:: Private residence Living Arrangements: Spouse/significant other (wife has dementia per chart)   Type of Home: Independent living facility Home Access: Level entry   Home Layout: One level Home Equipment: Walker - 2 wheels      Prior Function Level of Independence: Independent with assistive device(s)      Comments: uses RW most recently but prior to fall, he did not use a device. 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 per chart, son is trying to get pt and wife in ALF   PT Goals (current goals can now be found in the care plan section) Acute Rehab PT Goals Patient Stated Goal: less pain. wants to go back home to help his wife PT Goal Formulation: With patient Time For Goal Achievement: 10/16/20 Potential to Achieve Goals: Good Progress towards PT goals: Progressing toward goals    Frequency    Min 3X/week      PT Plan Current plan  remains appropriate    Co-evaluation              AM-PAC PT "6 Clicks" Mobility   Outcome Measure  Help needed turning from your back to your side while in a flat bed without using bedrails?: A Little Help needed moving from lying on your back to sitting on the side of a flat bed without using bedrails?: A Lot Help needed moving to and from a bed to a chair (including a wheelchair)?: A Little Help needed standing up from a chair using your arms (e.g., wheelchair or bedside chair)?: A Little Help needed to walk in hospital room?: A Little Help needed climbing 3-5 steps with a railing? : A Lot 6 Click Score: 16    End of Session Equipment Utilized During Treatment: Gait belt Activity Tolerance: Patient tolerated treatment well Patient left: in chair;with call bell/phone within reach;with chair alarm set   PT Visit Diagnosis: History of falling (Z91.81);Muscle weakness (generalized) (M62.81);Difficulty in walking, not elsewhere classified (R26.2)     Time: [id number] PT Time Calculation (min) (ACUTE ONLY): 15 min  Charges:  $Gait Training: 8-22 mins                         Doreatha Massed, PT Acute Rehabilitation  Office: [phone] Pager: [phone]

## 2020-10-02 NOTE — Evaluation (Signed)
Physical Therapy Evaluation Patient Details Name: [name] MRN: [id number] DOB: 1926/06/04 Today's Date: 10/02/2020  History of Present Illness  87 yo male admitted with CHF, elevated troponin after falling at home. PMH: dementia, HTN, aortic valve replacement, CAD, CABG, falls, aortic stenosis  Clinical Impression  On eval, pt required Mod A for mobility. He walked ~20 feet with a RW. Pt presents with general weakness, decreased activity tolerance, and impaired gait and balance. He remains at risk for falls. He c/o generalized pain in addition to back pain. Will plan to follow and progress activity as tolerated. PT recommendation is for [facility] SNF however pt is not really agreeable-he is concerned about his wife at home (he is her caregiver).        Recommendations for follow up therapy are one component of a multi-disciplinary discharge planning process, led by the attending physician.  Recommendations may be updated based on patient status, additional functional criteria and insurance authorization.  Follow Up Recommendations SNF (if pt/family are agreeable.)    Equipment Recommendations  None recommended by PT    Recommendations for Other Services       Precautions / Restrictions Precautions Precautions: Fall Restrictions Weight Bearing Restrictions: No      Mobility  Bed Mobility Overal bed mobility: Needs Assistance Bed Mobility: Sit to Supine     Supine to sit: Mod assist;HOB elevated Sit to supine: Min assist   General bed mobility comments: Small amount of assistance and increased time. Cues required.    Transfers Overall transfer level: Needs assistance Equipment used: Rolling walker (2 wheeled) Transfers: Sit to/from Stand Sit to Stand: Mod assist Stand pivot transfers: Min assist;+2 safety/equipment       General transfer comment: Assist to power up, steady. Cues for technique, hand placement. Increased time.  Ambulation/Gait Ambulation/Gait  assistance: Min assist Gait Distance (Feet): 20 Feet Assistive device: Rolling walker (2 wheeled) Gait Pattern/deviations: Step-through pattern;Decreased stride length     General Gait Details: Assist to stabilize pt and to manage RW safely. Shuffling pattern at times, especially with turns/backwards steps. Cues required.  Stairs            Wheelchair Mobility    Modified Rankin (Stroke Patients Only)       Balance Overall balance assessment: Needs assistance;History of Falls Sitting-balance support: Feet supported;Single extremity supported Sitting balance-Leahy Scale: Fair     Standing balance support: Bilateral upper extremity supported Standing balance-Leahy Scale: Poor                               Pertinent Vitals/Pain Pain Assessment: Faces Faces Pain Scale: Hurts even more Pain Location: generalized but also specifically back Pain Descriptors / Indicators: Discomfort;Sore;Guarding;Aching Pain Intervention(s): Limited activity within patient's tolerance;Monitored during session;Repositioned    Home Living Family/patient expects to be discharged to:: Private residence Living Arrangements: Spouse/significant other (wife has dementia per chart)   Type of Home: Independent living facility Home Access: Level entry     Home Layout: One level Home Equipment: Walker - 2 wheels      Prior Function Level of Independence: Independent with assistive device(s)         Comments: uses RW most recently but prior to fall, he did not use a device. 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 per chart, son is trying to get pt and wife in ALF     Hand Dominance   Dominant Hand:  Right    Extremity/Trunk Assessment   Upper Extremity Assessment Upper Extremity Assessment: Defer to OT evaluation    Lower Extremity Assessment Lower Extremity Assessment: Generalized weakness    Cervical / Trunk Assessment Cervical / Trunk Assessment:  Kyphotic  Communication   Communication: No difficulties  Cognition Arousal/Alertness: Awake/alert Behavior During Therapy: WFL for tasks assessed/performed Overall Cognitive Status: History of cognitive impairments - at baseline                                 General Comments: patient was noted to have some moments of memory loss during session with patient reporting he lived alone then reporting he lives with wife and cares for her later in conversation. will need to follow up with family for more information.      General Comments      Exercises Total Joint Exercises Long Arc Quad: AROM;Both;10 reps;Seated Marching in Standing: AROM;Both;10 reps   Assessment/Plan    PT Assessment Patient needs continued PT services  PT Problem List Decreased strength;Decreased mobility;Decreased activity tolerance;Decreased balance;Decreased cognition;Pain;Decreased knowledge of use of DME       PT Treatment Interventions DME instruction;Gait training;Therapeutic exercise;Balance training;Functional mobility training;Therapeutic activities;Patient/family education    PT Goals (Current goals can be found in the Care Plan section)  Acute Rehab PT Goals Patient Stated Goal: less pain. wants to go back home to help his wife PT Goal Formulation: With patient Time For Goal Achievement: 10/16/20 Potential to Achieve Goals: Good    Frequency Min 3X/week   Barriers to discharge Decreased caregiver support      Co-evaluation               AM-PAC PT "6 Clicks" Mobility  Outcome Measure Help needed turning from your back to your side while in a flat bed without using bedrails?: A Little Help needed moving from lying on your back to sitting on the side of a flat bed without using bedrails?: A Lot Help needed moving to and from a bed to a chair (including a wheelchair)?: A Little Help needed standing up from a chair using your arms (e.g., wheelchair or bedside chair)?: A  Lot Help needed to walk in hospital room?: A Little Help needed climbing 3-5 steps with a railing? : A Lot 6 Click Score: 15    End of Session Equipment Utilized During Treatment: Gait belt Activity Tolerance: Patient tolerated treatment well Patient left: in bed;with call bell/phone within reach;with bed alarm set   PT Visit Diagnosis: History of falling (Z91.81);Muscle weakness (generalized) (M62.81);Difficulty in walking, not elsewhere classified (R26.2)    Time: 1105-1120 PT Time Calculation (min) (ACUTE ONLY): 15 min   Charges:   PT Evaluation $PT Eval Moderate Complexity: [place], PT Acute Rehabilitation  Office: [phone] Pager: [phone]

## 2020-10-03 DIAGNOSIS — F0390 Unspecified dementia without behavioral disturbance: Secondary | ICD-10-CM

## 2020-10-03 DIAGNOSIS — D72829 Elevated white blood cell count, unspecified: Secondary | ICD-10-CM

## 2020-10-03 LAB — CBC
HCT: 35.8 % — ABNORMAL LOW (ref 39.0–52.0)
Hemoglobin: 11.8 g/dL — ABNORMAL LOW (ref 13.0–17.0)
MCH: 29.5 pg (ref 26.0–34.0)
MCHC: 33 g/dL (ref 30.0–36.0)
MCV: 89.5 fL (ref 80.0–100.0)
Platelets: 883 10*3/uL — ABNORMAL HIGH (ref 150–400)
RBC: 4 MIL/uL — ABNORMAL LOW (ref 4.22–5.81)
RDW: 18.5 % — ABNORMAL HIGH (ref 11.5–15.5)
WBC: 26.9 10*3/uL — ABNORMAL HIGH (ref 4.0–10.5)
nRBC: 0 % (ref 0.0–0.2)

## 2020-10-03 LAB — BASIC METABOLIC PANEL
Anion gap: 10 (ref 5–15)
BUN: 33 mg/dL — ABNORMAL HIGH (ref 8–23)
CO2: 24 mmol/L (ref 22–32)
Calcium: 8.5 mg/dL — ABNORMAL LOW (ref 8.9–10.3)
Chloride: 108 mmol/L (ref 98–111)
Creatinine, Ser: 1.49 mg/dL — ABNORMAL HIGH (ref 0.61–1.24)
GFR, Estimated: 44 mL/min — ABNORMAL LOW (ref >60)
Glucose, Bld: 106 mg/dL — ABNORMAL HIGH (ref 70–99)
Potassium: 3.8 mmol/L (ref 3.5–5.1)
Sodium: 142 mmol/L (ref 135–145)

## 2020-10-03 MED ORDER — FUROSEMIDE 20 MG PO TABS: 20.0000 mg | ORAL_TABLET | Freq: Every day | ORAL | 3 refills | Status: AC

## 2020-10-03 MED ORDER — AMLODIPINE BESYLATE 10 MG PO TABS
10.0000 mg | ORAL_TABLET | Freq: Every day | ORAL | Status: DC
  Administered 2020-10-03: 10 mg via ORAL
  Filled 2020-10-03: qty 1

## 2020-10-03 MED ORDER — AMLODIPINE BESYLATE 10 MG PO TABS: 10.0000 mg | ORAL_TABLET | Freq: Every day | ORAL | 3 refills | Status: AC

## 2020-10-03 MED ORDER — ATORVASTATIN CALCIUM 20 MG PO TABS: 20.0000 mg | ORAL_TABLET | Freq: Every day | ORAL | 3 refills | Status: AC

## 2020-10-03 NOTE — Discharge Summary (Signed)
Physician Discharge Summary   [name] [id number] DOB: [birth year] DOA: 09/30/2020  PCP: Lavone Orn, MD  Admit date: 09/30/2020 Discharge date:  10/03/2020  Admitted From: home Disposition:  home Discharging physician: Dwyane Dee, MD  Recommendations for Outpatient Follow-up:  Follow up with cardiology Repeat BMP in 1 week Follow up BP response  Follow up with hematology    Home Health:  Equipment/Devices:   Patient discharged to home in Discharge Condition: stable Risk of unplanned readmission score: Unplanned Admission- Pilot do not use: 15.62  CODE STATUS: DNR Diet recommendation:  Diet Orders (From admission, onward)     Start     Ordered   10/01/20 1157  DIET - DYS 1 Room service appropriate? Yes; Fluid consistency: Nectar Thick  Diet effective now       Comments: May have thin liquids in between meals with straw only (no cup sips) Meds crushed with puree  Question Answer Comment  Room service appropriate? Yes   Fluid consistency: Nectar Thick      10/01/20 1157            Hospital Course:  [name] is a 87 y.o. male with history of dementia, CAD s/p CABG in 2010, AS s/p AVR, HTN and BPH returning to ED after recurrent fall. Patient was seen in ED after found down on the floor at independent living facility.  Work-up did not reveal any traumatic injuries.  He was found to have elevated troponin as well as elevated BNP.  Cardiology was consulted.  NSTEMI, demand ischemia -Troponin leak considered due to trauma from fall and being on the ground -Appreciate cardiology assistance during hospitalization -Echocardiogram with no wall motion abnormalities  -No further work up planned    Acute diastolic CHF -BNP [id number]  -Echocardiogram showed normal EF with grade 2 diastolic dysfunction -Restart Lasix 20 mg daily on discharge with follow-up BMP -Follow-up with Dr. Irish Lack in 2 weeks   Hypertension -Continue Norvasc; increased to 10 mg at  discharge due to BP above goal still  Recent fall -Fall precaution -PT OT evaluation recommending SNF placement. Patient and family wished to return back to independent living    Persistent leukocytosis -Since 09/10/2020 associated with thrombocytosis  -UA and urine culture negative -Blood culture negative -Sepsis ruled out, no source of infection found. Stop cefepime/vanco and monitor  -WBC improving off antibiotics -Will refer to outpatient hematology on discharge, order placed   Dementia -Delirium precautions while in hospital   CKD stage IIIb -Baseline creatinine 1.5 -Stable   CAD -Continue aspirin, lipitor    Dysphagia -Appreciate SLP, status post MBS -Continue dysphagia 1 diet     In agreement with assessment of the pressure ulcer as below:  Pressure Injury 09/30/20 Buttocks Left Stage 2 -  Partial thickness loss of dermis presenting as a shallow open injury with a red, pink wound bed without slough. wound red/purple dime sized (Active)  09/30/20 1758  Location: Buttocks  Location Orientation: Left  Staging: Stage 2 -  Partial thickness loss of dermis presenting as a shallow open injury with a red, pink wound bed without slough.  Wound Description (Comments): wound red/purple dime sized  Present on Admission: Yes     Pressure Injury 09/30/20 Buttocks Right Stage 2 -  Partial thickness loss of dermis presenting as a shallow open injury with a red, pink wound bed without slough. re/purple dime sized wound (Active)  09/30/20 1759  Location: Buttocks  Location Orientation: Right  Staging: Stage 2 -  Partial thickness loss of dermis presenting as a shallow open injury with a red, pink wound bed without slough.  Wound Description (Comments): re/purple dime sized wound  Present on Admission: Yes      The patient's chronic medical conditions were treated accordingly per the patient's home medication regimen except as noted.  On day of discharge, patient was felt deemed  stable for discharge. Patient/family member advised to call PCP or come back to ER if needed.   Principal Diagnosis: NSTEMI (non-ST elevated myocardial infarction) [facility])  Discharge Diagnoses: Active Hospital Problems   Diagnosis Date Noted   NSTEMI (non-ST elevated myocardial infarction) ([place]) 09/30/2020   Dementia without behavioral disturbance ([place]) 10/03/2020   Leukocytosis 10/03/2020   Pressure injury of skin 09/30/2020   Essential hypertension, benign 01/25/2013   Mixed hyperlipidemia 01/25/2013    Resolved Hospital Problems  No resolved problems to display.    Discharge Instructions     Ambulatory referral to Hematology / Oncology   Complete by: As directed    Discharge wound care:   Complete by: As directed    Off load pressure as able from buttocks.   Increase activity slowly   Complete by: As directed       Allergies as of 10/03/2020   No Known Allergies      Medication List     TAKE these medications    amLODipine 10 MG tablet Commonly known as: NORVASC Take 1 tablet (10 mg total) by mouth daily. What changed: how much to take   amoxicillin 500 MG tablet Commonly known as: AMOXIL TAKE 4 TABLETS BY MOUTH 1 HOUR BEFORE DENTAL PROCEDURE   aspirin EC 81 MG tablet Take 81 mg by mouth daily.   atorvastatin 20 MG tablet Commonly known as: LIPITOR Take 1 tablet (20 mg total) by mouth daily. What changed:  medication strength how much to take   furosemide 20 MG tablet Commonly known as: Lasix Take 1 tablet (20 mg total) by mouth daily. What changed:  medication strength how much to take   latanoprost 0.005 % ophthalmic solution Commonly known as: XALATAN 1 drop daily.   multivitamin with minerals tablet Take 1 tablet by mouth daily.               Discharge Care Instructions  (From admission, onward)           Start     Ordered   10/03/20 0000  Discharge wound care:       Comments: Off load pressure as able from buttocks.    10/03/20 1043            Follow-up Information     Furth, Cadence H, PA-C. Go on 10/19/2020.   Specialty: Cardiology Why: Hospital follow-up with Cardiology scheduled for 10/19/2020 at 8:00am. If this date/time does not work for you, pleae call our office to reschedule. Contact information: [place] [address] [phone]                No Known Allergies  Consultations: Cardiology  Discharge Exam: BP (!) 176/73 (BP Location: Right Arm)   Pulse 67   Temp 97.6 F (36.4 C) (Oral)   Resp 18   Wt 66.9 kg   SpO2 99%   BMI 20.00 kg/m  General appearance: alert, cooperative, no distress, and slowed mentation Head: Normocephalic, without obvious abnormality, atraumatic Eyes:  EOMI Lungs: clear to auscultation bilaterally Heart: regular rate and rhythm and S1, S2 normal Abdomen: normal  findings: bowel sounds normal and soft, non-tender Extremities:  no edema Skin: mobility and turgor normal Neurologic: dementia noted; no focal deficits  The results of significant diagnostics from this hospitalization (including imaging, microbiology, ancillary and laboratory) are listed below for reference.   Microbiology: Recent Results (from the past 240 hour(s))  Resp Panel by RT-PCR (Flu A&B, Covid) Nasopharyngeal Swab     Status: None   Collection Time: 09/30/20 11:41 AM   Specimen: Nasopharyngeal Swab; Nasopharyngeal(NP) swabs in vial transport medium  Result Value Ref Range Status   SARS Coronavirus 2 by RT PCR NEGATIVE NEGATIVE Final    Comment: (NOTE) SARS-CoV-2 target nucleic acids are NOT DETECTED.  The SARS-CoV-2 RNA is generally detectable in upper respiratory specimens during the acute phase of infection. The lowest concentration of SARS-CoV-2 viral copies this assay can detect is 138 copies/mL. A negative result does not preclude SARS-Cov-2 infection and should not be used as the sole basis for treatment or other patient management  decisions. A negative result may occur with  improper specimen collection/handling, submission of specimen other than nasopharyngeal swab, presence of viral mutation(s) within the areas targeted by this assay, and inadequate number of viral copies(<138 copies/mL). A negative result must be combined with clinical observations, patient history, and epidemiological information. The expected result is Negative.  Fact Sheet for Patients:  EntrepreneurPulse.com.au  Fact Sheet for Healthcare Providers:  IncredibleEmployment.be  This test is no t yet approved or cleared by the [place] FDA and  has been authorized for detection and/or diagnosis of SARS-CoV-2 by FDA under an Emergency Use Authorization (EUA). This EUA will remain  in effect (meaning this test can be used) for the duration of the COVID-19 declaration under Section 564(b)(1) of the Act, 21 U.S.C.section 360bbb-3(b)(1), unless the authorization is terminated  or revoked sooner.       Influenza A by PCR NEGATIVE NEGATIVE Final   Influenza B by PCR NEGATIVE NEGATIVE Final    Comment: (NOTE) The Xpert Xpress SARS-CoV-2/FLU/RSV plus assay is intended as an aid in the diagnosis of influenza from Nasopharyngeal swab specimens and should not be used as a sole basis for treatment. Nasal washings and aspirates are unacceptable for Xpert Xpress SARS-CoV-2/FLU/RSV testing.  Fact Sheet for Patients: EntrepreneurPulse.com.au  Fact Sheet for Healthcare Providers: IncredibleEmployment.be  This test is not yet approved or cleared by the [place] FDA and has been authorized for detection and/or diagnosis of SARS-CoV-2 by FDA under an Emergency Use Authorization (EUA). This EUA will remain in effect (meaning this test can be used) for the duration of the COVID-19 declaration under Section 564(b)(1) of the Act, 21 U.S.C. section 360bbb-3(b)(1), unless the  authorization is terminated or revoked.  Performed at [facility], [place] [address], [place], [place] [address]   Urine Culture     Status: None   Collection Time: 09/30/20 11:41 AM   Specimen: Urine, Clean Catch  Result Value Ref Range Status   Specimen Description   Final    URINE, CLEAN CATCH Performed at [facility], [place] [address], [place], [place] [address]    Special Requests   Final    NONE Performed at [facility], [place] [address], [place], [place] [address]    Culture   Final    NO GROWTH Performed at [facility], [place] [address], [place], [place] [address]    Report Status 10/01/2020 FINAL  Final  Culture, blood (routine x 2)  Status: None (Preliminary result)   Collection Time: 09/30/20  6:33 PM   Specimen: BLOOD  Result Value Ref Range Status   Specimen Description   Final    BLOOD LEFT ANTECUBITAL Performed at [place] [address] [place], [place] [address]    Special Requests   Final    BOTTLES DRAWN AEROBIC AND ANAEROBIC Blood Culture adequate volume Performed at [place] [address], [place], [place] [address]    Culture   Final    NO GROWTH 3 DAYS Performed at [facility], [place] [address], [place], [place] [address]    Report Status PENDING  Incomplete  Culture, blood (routine x 2)     Status: None (Preliminary result)   Collection Time: 09/30/20  6:33 PM   Specimen: BLOOD  Result Value Ref Range Status   Specimen Description   Final    BLOOD BLOOD LEFT HAND Performed at [place] [address], [place], [place] [address]    Special Requests   Final    BOTTLES DRAWN AEROBIC AND ANAEROBIC Blood Culture adequate volume Performed at [place] [address] [place], [place] [address]    Culture   Final    NO GROWTH 3 DAYS Performed at [facility], [place] [address],  [place], [place] [address]    Report Status PENDING  Incomplete     Labs: BNP (last 3 results) Recent Labs    09/30/20 1141  BNP [id number]*   Basic Metabolic Panel: Recent Labs  Lab 09/29/20 1740 09/30/20 1141 10/01/20 0422 10/02/20 0403 10/03/20 0420  NA 139 143 139 139 142  K 4.6 4.5 3.9 3.5 3.8  CL 108 110 107 107 108  CO2 21* '23 24 24 24  '$ GLUCOSE 106* 125* 112* 102* 106*  BUN 34* 38* 38* 36* 33*  CREATININE 1.63* 1.78* 1.78* 1.68* 1.49*  CALCIUM 9.0 9.4 8.5* 8.5* 8.5*   Liver Function Tests: Recent Labs  Lab 09/30/20 1141  AST 56*  ALT 20  ALKPHOS 89  BILITOT 1.5*  PROT 7.9  ALBUMIN 4.0   No results for input(s): LIPASE, AMYLASE in the last 168 hours. No results for input(s): AMMONIA in the last 168 hours. CBC: Recent Labs  Lab 09/29/20 1740 09/30/20 1141 10/01/20 0422 10/02/20 0403 10/03/20 0420  WBC 32.8* 43.6* 31.1* 24.0* 26.9*  NEUTROABS 26.5* 36.9*  --   --   --   HGB 12.2* 12.8* 11.2* 10.7* 11.8*  HCT 38.5* 38.6* 33.3* 32.4* 35.8*  MCV 91.9 89.8 89.3 89.3 89.5  PLT 745* 756* 682* 709* 883*   Cardiac Enzymes: Recent Labs  Lab 09/29/20 1740  CKTOTAL 102   BNP: Invalid input(s): POCBNP CBG: No results for input(s): GLUCAP in the last 168 hours. D-Dimer No results for input(s): DDIMER in the last 72 hours. Hgb A1c No results for input(s): HGBA1C in the last 72 hours. Lipid Profile No results for input(s): CHOL, HDL, LDLCALC, TRIG, CHOLHDL, LDLDIRECT in the last 72 hours. Thyroid function studies No results for input(s): TSH, T4TOTAL, T3FREE, THYROIDAB in the last 72 hours.  Invalid input(s): FREET3 Anemia work up No results for input(s): VITAMINB12, FOLATE, FERRITIN, TIBC, IRON, RETICCTPCT in the last 72 hours. Urinalysis    Component Value Date/Time   COLORURINE YELLOW (A) 09/30/2020 1141   APPEARANCEUR CLEAR (A) 09/30/2020 1141   LABSPEC 1.025 09/30/2020 1141   PHURINE 5.5 09/30/2020 1141   GLUCOSEU NEGATIVE 09/30/2020 1141  HGBUR LARGE (A) 09/30/2020 1141   BILIRUBINUR NEGATIVE 09/30/2020 1141   KETONESUR TRACE (A) 09/30/2020 1141   PROTEINUR 100 (A) 09/30/2020 1141   UROBILINOGEN 1.0 11/05/2013 2131   NITRITE NEGATIVE 09/30/2020 1141   LEUKOCYTESUR NEGATIVE 09/30/2020 1141   Sepsis Labs Invalid input(s): PROCALCITONIN,  WBC,  LACTICIDVEN Microbiology Recent Results (from the past 240 hour(s))  Resp Panel by RT-PCR (Flu A&B, Covid) Nasopharyngeal Swab     Status: None   Collection Time: 09/30/20 11:41 AM   Specimen: Nasopharyngeal Swab; Nasopharyngeal(NP) swabs in vial transport medium  Result Value Ref Range Status   SARS Coronavirus 2 by RT PCR NEGATIVE NEGATIVE Final    Comment: (NOTE) SARS-CoV-2 target nucleic acids are NOT DETECTED.  The SARS-CoV-2 RNA is generally detectable in upper respiratory specimens during the acute phase of infection. The lowest concentration of SARS-CoV-2 viral copies this assay can detect is 138 copies/mL. A negative result does not preclude SARS-Cov-2 infection and should not be used as the sole basis for treatment or other patient management decisions. A negative result may occur with  improper specimen collection/handling, submission of specimen other than nasopharyngeal swab, presence of viral mutation(s) within the areas targeted by this assay, and inadequate number of viral copies(<138 copies/mL). A negative result must be combined with clinical observations, patient history, and epidemiological information. The expected result is Negative.  Fact Sheet for Patients:  EntrepreneurPulse.com.au  Fact Sheet for Healthcare Providers:  IncredibleEmployment.be  This test is no t yet approved or cleared by the [place] FDA and  has been authorized for detection and/or diagnosis of SARS-CoV-2 by FDA under an Emergency Use Authorization (EUA). This EUA will remain  in effect (meaning this test can be used) for the duration of  the COVID-19 declaration under Section 564(b)(1) of the Act, 21 U.S.C.section 360bbb-3(b)(1), unless the authorization is terminated  or revoked sooner.       Influenza A by PCR NEGATIVE NEGATIVE Final   Influenza B by PCR NEGATIVE NEGATIVE Final    Comment: (NOTE) The Xpert Xpress SARS-CoV-2/FLU/RSV plus assay is intended as an aid in the diagnosis of influenza from Nasopharyngeal swab specimens and should not be used as a sole basis for treatment. Nasal washings and aspirates are unacceptable for Xpert Xpress SARS-CoV-2/FLU/RSV testing.  Fact Sheet for Patients: EntrepreneurPulse.com.au  Fact Sheet for Healthcare Providers: IncredibleEmployment.be  This test is not yet approved or cleared by the [place] FDA and has been authorized for detection and/or diagnosis of SARS-CoV-2 by FDA under an Emergency Use Authorization (EUA). This EUA will remain in effect (meaning this test can be used) for the duration of the COVID-19 declaration under Section 564(b)(1) of the Act, 21 U.S.C. section 360bbb-3(b)(1), unless the authorization is terminated or revoked.  Performed at [facility], [place] [address], [place], [place] [address]   Urine Culture     Status: None   Collection Time: 09/30/20 11:41 AM   Specimen: Urine, Clean Catch  Result Value Ref Range Status   Specimen Description   Final    URINE, CLEAN CATCH Performed at [facility], [place] [address] [place], [place] [address]    Special Requests   Final    NONE Performed at [facility], [place] [address] [place], [place] [address]    Culture   Final    NO GROWTH Performed at [facility], [place] [address], [place], [place] [address]    Report Status 10/01/2020 FINAL  Final  Culture, blood (routine x 2)     Status: None (Preliminary result)   Collection Time: 09/30/20  6:33 PM   Specimen: BLOOD  Result Value Ref Range  Status   Specimen Description   Final    BLOOD LEFT ANTECUBITAL Performed at [place] [address] [place], [place] [address]    Special Requests   Final    BOTTLES DRAWN AEROBIC AND ANAEROBIC Blood Culture adequate volume Performed at [place] [address] [place], [place] [address]    Culture   Final    NO GROWTH 3 DAYS Performed at [facility], [place] [address] [place], [place] [address]    Report Status PENDING  Incomplete  Culture, blood (routine x 2)     Status: None (Preliminary result)   Collection Time: 09/30/20  6:33 PM   Specimen: BLOOD  Result Value Ref Range Status   Specimen Description   Final    BLOOD BLOOD LEFT HAND Performed at [place] [address], [place], [place] [address]    Special Requests   Final    BOTTLES DRAWN AEROBIC AND ANAEROBIC Blood Culture adequate volume Performed at [place] [address] [place], [place] [address]    Culture   Final    NO GROWTH 3 DAYS Performed at [facility], [place] [address], [place], [place] [address]    Report Status PENDING  Incomplete    Procedures/Studies: DG Chest 1 View  Result Date: 09/10/2020 CLINICAL DATA:  Unwitnessed fall. EXAM: CHEST  1 VIEW COMPARISON:  Jun 08, 2015. FINDINGS: Stable cardiomegaly. Status post coronary bypass graft and aortic valve repair. No pneumothorax is noted. Mild bibasilar subsegmental atelectasis or scarring is noted. Loculated left pleural effusion on prior exam is significantly smaller. Bony thorax is unremarkable. IMPRESSION: Mild bibasilar subsegmental atelectasis or scarring. Loculated left pleural effusion is significantly smaller compared to prior exam. Aortic Atherosclerosis (ICD10-I70.0). Electronically Signed   By: Marijo Conception M.D.   On: 09/10/2020 12:19   DG Chest 2 View  Result Date: 09/29/2020 CLINICAL DATA:  Fall EXAM: CHEST - 2 VIEW COMPARISON:   09/10/2020, 04/08/2015 FINDINGS: Post sternotomy changes. Cardiomegaly with aortic atherosclerosis. Chronic pleural and parenchymal scarring at the left base. Mild interstitial prominence could be due to edema. Aortic valve prosthesis. No visible pneumothorax. IMPRESSION: 1. Cardiomegaly and mild diffuse interstitial opacity which could be due to mild edema 2. Chronic pleural and parenchymal scarring at left lung base. Electronically Signed   By: Donavan Foil M.D.   On: 09/29/2020 17:20   DG Lumbar Spine Complete  Result Date: 09/29/2020 CLINICAL DATA:  [place], back pain EXAM: LUMBAR SPINE - COMPLETE 4+ VIEW COMPARISON:  None. FINDINGS: Frontal, bilateral oblique, lateral views of the lumbar spine are obtained. There are 5 non-rib-bearing lumbar type vertebral bodies in normal anatomic alignment. No acute displaced fractures. There is prominent spondylosis at the thoracolumbar junction with bridging anterior osteophytes. Significant facet hypertrophy is seen within the lower lumbar spine greatest at L3-4, L4-5, and L5-S1. Sacroiliac joints are unremarkable. Visualized portions of the bony pelvis are normal. IMPRESSION: 1. Multilevel thoracolumbar spondylosis and facet hypertrophy. No acute bony abnormality. Electronically Signed   By: Randa Ngo M.D.   On: 09/29/2020 17:21   DG Pelvis 1-2 Views  Result Date: 09/29/2020 CLINICAL DATA:  Fall EXAM: PELVIS - 1-2 VIEW COMPARISON:  09/10/2020 FINDINGS: SI joints show mild degenerative change but are non  widened. Pubic symphysis and rami appear intact. No definitive fracture or malalignment. An oblique lucency over the left femoral neck appears to represent a skin fold artifact. Moderate bilateral hip arthritis IMPRESSION: 1. No definite acute osseous abnormality. A lucency over the left femoral neck is suspected to be due to fold artifact 2. Cross-sectional imaging should be pursued if high clinical suspicion for hip fracture Electronically Signed   By: Donavan Foil M.D.   On: 09/29/2020 17:37   DG Elbow Complete Left  Result Date: 09/30/2020 CLINICAL DATA:  Fall in a 87 year old male with bilateral skin tears to the elbows. EXAM: LEFT ELBOW - COMPLETE 3+ VIEW COMPARISON:  Contralateral elbow FINDINGS: Small well corticated bone fragments adjacent to the lateral epicondyle. No acute fracture or dislocation. No substantial soft tissue swelling or sign of joint effusion. IMPRESSION: No acute fracture or traumatic malalignment. Small well corticated bone fragments adjacent to the lateral epicondyle may reflect remote injury or sequela of prior epicondylitis. Electronically Signed   By: Zetta Bills M.D.   On: 09/30/2020 12:08   DG Elbow Complete Right  Result Date: 09/30/2020 CLINICAL DATA:  Right elbow pain and skin tears following a fall. EXAM: RIGHT ELBOW - COMPLETE 3+ VIEW COMPARISON:  09/29/2020 FINDINGS: Decreased posterior elbow swelling. No fracture, dislocation or effusion. IMPRESSION: No fracture or dislocation. Electronically Signed   By: Claudie Revering M.D.   On: 09/30/2020 12:12   DG Elbow Complete Right  Result Date: 09/29/2020 CLINICAL DATA:  Fall with laceration EXAM: RIGHT ELBOW - COMPLETE 3+ VIEW COMPARISON:  None. FINDINGS: There is no evidence of fracture, or dislocation. Slightly limited lateral view due to nonstandard positioning. Posterior elbow swelling. No gross effusion IMPRESSION: No definite acute osseous abnormality Electronically Signed   By: Donavan Foil M.D.   On: 09/29/2020 17:21   CT HEAD WO CONTRAST  Result Date: 09/30/2020 CLINICAL DATA:  Head trauma. EXAM: CT HEAD WITHOUT CONTRAST TECHNIQUE: Contiguous axial images were obtained from the base of the skull through the vertex without intravenous contrast. COMPARISON:  September 29, 2020 FINDINGS: Brain: No evidence of acute infarction, hemorrhage, hydrocephalus, extra-axial collection or mass lesion/mass effect. Moderate brain parenchymal volume loss and deep white  matter microangiopathy. Vascular: No hyperdense vessel or unexpected calcification. Skull: Normal. Negative for fracture or focal lesion. Sinuses/Orbits: No acute finding. Other: None. IMPRESSION: 1. No acute intracranial abnormality. 2. Moderate brain parenchymal atrophy and chronic microvascular disease. Electronically Signed   By: Fidela Salisbury M.D.   On: 09/30/2020 14:21   CT HEAD WO CONTRAST (5MM)  Result Date: 09/29/2020 CLINICAL DATA:  A 87 year old male patient found on the floor, found down. EXAM: CT HEAD WITHOUT CONTRAST CT CERVICAL SPINE WITHOUT CONTRAST TECHNIQUE: Multidetector CT imaging of the head and cervical spine was performed following the standard protocol without intravenous contrast. Multiplanar CT image reconstructions of the cervical spine were also generated. COMPARISON:  Jun 15, 2015. FINDINGS: CT HEAD FINDINGS Brain: No evidence of acute infarction, hemorrhage, hydrocephalus, extra-axial collection or mass lesion/mass effect. Signs of marked atrophy and chronic microvascular ischemic change with worsening of atrophy since previous imaging. Vascular: No hyperdense vessel or unexpected calcification. Skull: Normal. Negative for fracture or focal lesion. Sinuses/Orbits: No acute finding. Other: None CT CERVICAL SPINE FINDINGS Alignment: Straightening of normal cervical lordotic curvature in the setting of marked spinal degenerative change. Skull base and vertebrae: No acute fracture. No primary bone lesion or focal pathologic process. Soft tissues and spinal canal: No prevertebral fluid or swelling.  No visible canal hematoma. Disc levels: Multilevel degenerative changes with marked atlantoaxial degenerative changes. Marked multilevel facet arthropathy on the RIGHT at C2-3, C3-4, C4-5 and to a lesser extent C5-6. Marked disc space narrowing at C5-6 with anterior osteophytes and uncovertebral spurring. Facet arthropathy on the LEFT at this level and the level below. Upper chest:  Negative. Other: None IMPRESSION: No acute intracranial abnormality. Signs of marked atrophy and chronic microvascular ischemic change with worsening of atrophy since previous imaging. No evidence for acute fracture or static subluxation of the cervical spine. Marked multilevel degenerative changes and facet arthropathy of the cervical spine. Electronically Signed   By: Zetta Bills M.D.   On: 09/29/2020 16:20   CT CHEST WO CONTRAST  Result Date: 09/30/2020 CLINICAL DATA:  Fall EXAM: CT CHEST WITHOUT CONTRAST TECHNIQUE: Multidetector CT imaging of the chest was performed following the standard protocol without IV contrast. COMPARISON:  Prior CT scan of the chest 04/30/2015 FINDINGS: Cardiovascular: Limited evaluation in the absence of intravenous contrast. No evidence of aortic aneurysm. Scattered atherosclerotic plaque throughout the abdominal aorta. Mild cardiomegaly. Extensive coronary artery atherosclerotic calcifications. Surgical changes of prior aortic valve replacement. No pericardial effusion. Mediastinum/Nodes: Unremarkable CT appearance of the thyroid gland. No suspicious mediastinal or hilar adenopathy. No soft tissue mediastinal mass. The thoracic esophagus is unremarkable. Lungs/Pleura: Small left and moderate right layering pleural effusions. Diffuse chronic bronchial wall thickening. Atelectasis present in both lower lobes. Mild interlobular septal thickening in the apices and bases. No suspicious pulmonary mass or nodule. Upper Abdomen: No acute abnormality. Musculoskeletal: No chest wall mass or suspicious bone lesions identified. Mild dextroconvex scoliosis. IMPRESSION: 1. No evidence of acute injury to the thorax. 2. Cardiomegaly with extensive coronary artery calcifications and surgical changes of prior aortic valve replacement. 3. Moderate right and small left layering pleural effusions. Combined with trace interstitial pulmonary edema, findings suggest mild or chronic CHF. 4. Diffuse  chronic bronchial wall thickening bilaterally. Aortic Atherosclerosis (ICD10-I70.0). Electronically Signed   By: Jacqulynn Cadet M.D.   On: 09/30/2020 14:31   CT CERVICAL SPINE WO CONTRAST  Result Date: 09/30/2020 CLINICAL DATA:  Post fall.  Neck trauma. EXAM: CT CERVICAL SPINE WITHOUT CONTRAST TECHNIQUE: Multidetector CT imaging of the cervical spine was performed without intravenous contrast. Multiplanar CT image reconstructions were also generated. COMPARISON:  September 29, 2020 FINDINGS: Alignment: Normal. Skull base and vertebrae: No acute fracture. No primary bone lesion or focal pathologic process. Soft tissues and spinal canal: No prevertebral fluid or swelling. No visible canal hematoma. Disc levels:  Multilevel osteoarthritic changes. Upper chest: Negative. Other: None. IMPRESSION: 1. No evidence of acute traumatic injury to the cervical spine. 2. Multilevel osteoarthritic changes of the cervical spine. Electronically Signed   By: Fidela Salisbury M.D.   On: 09/30/2020 14:26   CT Cervical Spine Wo Contrast  Result Date: 09/29/2020 CLINICAL DATA:  A 87 year old male patient found on the floor, found down. EXAM: CT HEAD WITHOUT CONTRAST CT CERVICAL SPINE WITHOUT CONTRAST TECHNIQUE: Multidetector CT imaging of the head and cervical spine was performed following the standard protocol without intravenous contrast. Multiplanar CT image reconstructions of the cervical spine were also generated. COMPARISON:  Jun 15, 2015. FINDINGS: CT HEAD FINDINGS Brain: No evidence of acute infarction, hemorrhage, hydrocephalus, extra-axial collection or mass lesion/mass effect. Signs of marked atrophy and chronic microvascular ischemic change with worsening of atrophy since previous imaging. Vascular: No hyperdense vessel or unexpected calcification. Skull: Normal. Negative for fracture or focal lesion. Sinuses/Orbits: No acute finding. Other:  None CT CERVICAL SPINE FINDINGS Alignment: Straightening of normal  cervical lordotic curvature in the setting of marked spinal degenerative change. Skull base and vertebrae: No acute fracture. No primary bone lesion or focal pathologic process. Soft tissues and spinal canal: No prevertebral fluid or swelling. No visible canal hematoma. Disc levels: Multilevel degenerative changes with marked atlantoaxial degenerative changes. Marked multilevel facet arthropathy on the RIGHT at C2-3, C3-4, C4-5 and to a lesser extent C5-6. Marked disc space narrowing at C5-6 with anterior osteophytes and uncovertebral spurring. Facet arthropathy on the LEFT at this level and the level below. Upper chest: Negative. Other: None IMPRESSION: No acute intracranial abnormality. Signs of marked atrophy and chronic microvascular ischemic change with worsening of atrophy since previous imaging. No evidence for acute fracture or static subluxation of the cervical spine. Marked multilevel degenerative changes and facet arthropathy of the cervical spine. Electronically Signed   By: Zetta Bills M.D.   On: 09/29/2020 16:20   CT ABDOMEN PELVIS W CONTRAST  Result Date: 09/30/2020 CLINICAL DATA:  Fall EXAM: CT ABDOMEN AND PELVIS WITH CONTRAST TECHNIQUE: Multidetector CT imaging of the abdomen and pelvis was performed using the standard protocol following bolus administration of intravenous contrast. CONTRAST:  71m OMNIPAQUE IOHEXOL 350 MG/ML SOLN COMPARISON:  None. FINDINGS: Lower chest: See concurrently obtained but separately dictated CT scan of the chest. Hepatobiliary: No focal liver abnormality is seen. No gallstones, gallbladder wall thickening, or biliary dilatation. Pancreas: Unremarkable. No pancreatic ductal dilatation or surrounding inflammatory changes. Spleen: Normal in size without focal abnormality. Adrenals/Urinary Tract: Unremarkable adrenal glands. Renal cortical atrophy bilaterally. Nonobstructing bilateral nephrolithiasis. No enhancing renal mass. No ureteral abnormalities. The bladder is  within normal limits. Stomach/Bowel: Scattered colonic diverticula without evidence of active inflammation. Normal appendix. No focal bowel wall thickening or evidence of obstruction. Vascular/Lymphatic: Atherosclerotic calcifications throughout the abdominal aorta without evidence of aneurysm. No suspicious lymphadenopathy. Reproductive: Prostate is unremarkable. Other: No abdominal wall hernia or abnormality. No abdominopelvic ascites. Musculoskeletal: No acute or significant osseous findings. Bilateral lower lumbar facet arthropathy. IMPRESSION: 1. No acute abnormality or evidence of injury in the abdomen or pelvis. 2. Bilateral nonobstructing nephrolithiasis. 3.  Aortic Atherosclerosis (ICD10-I70.0). 4. Lower lumbar facet arthropathy. 5. Scattered colonic diverticula without evidence of active inflammation. Electronically Signed   By: HJacqulynn CadetM.D.   On: 09/30/2020 14:25   CT L-SPINE NO CHARGE  Result Date: 09/30/2020 CLINICAL DATA:  Fall W19.XXXA (ICD-10-CM) EXAM: CT LUMBAR SPINE WITHOUT CONTRAST TECHNIQUE: Multidetector CT imaging of the lumbar spine was performed without intravenous contrast administration. Multiplanar CT image reconstructions were also generated. COMPARISON:  None. FINDINGS: Segmentation: 5 non rib-bearing lumbar vertebral bodies. Alignment: No substantial sagittal subluxation. Mild broad levocurvature. Vertebrae: Vertebral body heights are maintained. No evidence of acute fracture. Diffuse osteopenia. Paraspinal and other soft tissues: Please see concurrent CT abdomen/pelvis for intra-abdominal evaluation. Disc levels: Broad disc bulge at L4-L5 with at least mild canal stenosis. Severe lower lumbar facet arthropathy. Probable foraminal stenosis on the left at L4-L5 and L5-S1. IMPRESSION: 1. No evidence of acute fracture or traumatic malalignment. 2. Severe lower lumbar facet arthropathy with likely at least mild canal stenosis at L4-L5 and probable left foraminal stenosis at  L4-L5 and L5-S1. An MRI of the lumbar spine could better characterize the canal and foramina if clinically indicated. 3. Osteopenia. Electronically Signed   By: FMargaretha SheffieldM.D.   On: 09/30/2020 13:58   DG Chest Port 1 View  Result Date: 09/30/2020 CLINICAL DATA:  Unwitnessed fall.  EXAM: PORTABLE CHEST 1 VIEW COMPARISON:  09/29/2020 FINDINGS: Stable mildly enlarged cardiac silhouette, sternal wires and prosthetic aortic valve. Interval patchy opacity in both lungs, most pronounced in the lower lung zones. No visible pleural fluid. No fracture or pneumothorax seen. Thoracic spine degenerative changes. IMPRESSION: Interval patchy bilateral atelectasis or pneumonia. Pulmonary contusions are also a possibility. Electronically Signed   By: Claudie Revering M.D.   On: 09/30/2020 12:09   DG Swallowing Func-Speech Pathology  Result Date: 10/01/2020 Table formatting from the original result was not included. Objective Swallowing Evaluation: Type of Study: MBS-Modified Barium Swallow Study  Patient Details Name: [name] MRN: [id number] Date of Birth: Jul 26, 1926 Today's Date: 10/01/2020 Time: SLP Start Time (ACUTE ONLY): [id number] -SLP Stop Time (ACUTE ONLY): [id number] SLP Time Calculation (min) (ACUTE ONLY): 20 min Past Medical History: Past Medical History: Diagnosis Date  BPH (benign prostatic hyperplasia)   Coronary artery disease with hx of myocardial infarct w/o hx of CABG 2010  Dehydration   Diverticulosis   HOH (hard of hearing)   hearing aids  Hyperlipidemia   Hypertension   Impaired fasting glucose   Memory difficulty 10/19/2012  Memory loss   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 male with PMH: dementia, CAD s/p CABG in 2010, AS s/p  AVR, HTN and BPH returning to ED after recurrent fall. In ED, vitals WNL except slightly elevated BP, CXR revealed atelectasis/possible pneumonia/possible contusion, CT head and spine without acute finding, CTA chest showed cardiomegaly, moderate right and small left pleural effusion.  Subjective: pleasant, cooperative Assessment / Plan / Recommendation CHL IP CLINICAL IMPRESSIONS 10/01/2020 Clinical Impression Patient presents with a mild-moderate oropharyngeal dysphagia with a suspected cervical esophageal component as well. Patient exhibited oral delays with all tested boluses and when taking straw sips of thin liquids or nectar thick liquids, would appear to fill up oral cavity and hold liquid until fairly full prior to swallow. Premature spillage from oral cavity into vallecular sinus was observed with thin liquids. Patient exhibited piecemeal swallowing with puree solids. During pharyngeal phase, patient exhibited trace penetration (PAS 3) during the swallow with thin liquids via straw sips but no aspiration. He did exhibit aspiration of trace amount (PAS 8) during and after the swallow when consuming thin liquids via cup sips. SLP suspects patients awareness to liquids during cup sips was poor leading to aspiration, however improved when using straw sips. No penetration or aspiration observed with straw sips of nectar thick liquids. Puree solids resulted in trace to mild vallecular and pyriform sinus residuals as well as reduced pharyngeal transit. Trace residuals observed in pharynx in vallecular and pyriform sinuses with thin liquids and nectar thick liquids. Patient exhibited reduced UES relaxation with retention of barium just above UES observed. In addition, SLP observed presence of likely cervical osteophyte (no radiologist present to confirm). SLP is recommending Dys 1, nectar thick liquids but allow thin liquids in between meals with straw only (no cup sips). SLP Visit Diagnosis -- Attention and  concentration deficit following -- Frontal lobe and executive function deficit following -- Impact on safety and function --   CHL IP TREATMENT RECOMMENDATION 10/01/2020 Treatment Recommendations Therapy as outlined in  treatment plan below   Prognosis 10/01/2020 Prognosis for Safe Diet Advancement Fair Barriers to Reach Goals Cognitive deficits Barriers/Prognosis Comment patient with h/o dementia CHL IP DIET RECOMMENDATION 10/01/2020 SLP Diet Recommendations Dysphagia 1 (Puree) solids;Nectar thick liquid;Other (Comment) Liquid Administration via Straw;Other (Comment) Medication Administration Crushed with puree Compensations Minimize environmental distractions;Small sips/bites;Slow rate Postural Changes --   CHL IP OTHER RECOMMENDATIONS 10/01/2020 Recommended Consults -- Oral Care Recommendations Oral care BID;Staff/trained caregiver to provide oral care Other Recommendations Order thickener from pharmacy;Prohibited food (jello, ice cream, thin soups);Remove water pitcher;Clarify dietary restrictions   CHL IP FOLLOW UP RECOMMENDATIONS 10/01/2020 Follow up Recommendations 24 hour supervision/assistance;Skilled Nursing facility   [facility] IP FREQUENCY AND DURATION 10/01/2020 Speech Therapy Frequency (ACUTE ONLY) min 1 x/week Treatment Duration 1 week      CHL IP ORAL PHASE 10/01/2020 Oral Phase Impaired Oral - Pudding Teaspoon -- Oral - Pudding Cup -- Oral - Honey Teaspoon -- Oral - Honey Cup -- Oral - Nectar Teaspoon -- Oral - Nectar Cup -- Oral - Nectar Straw Weak lingual manipulation;Delayed oral transit;Piecemeal swallowing;Reduced posterior propulsion Oral - Thin Teaspoon -- Oral - Thin Cup Weak lingual manipulation;Holding of bolus;Piecemeal swallowing;Delayed oral transit;Reduced posterior propulsion;Premature spillage Oral - Thin Straw Reduced posterior propulsion;Delayed oral transit;Weak lingual manipulation;Holding of bolus;Premature spillage Oral - Puree Weak lingual manipulation;Reduced posterior propulsion;Delayed  oral transit;Piecemeal swallowing Oral - Mech Soft -- Oral - Regular -- Oral - Multi-Consistency -- Oral - Pill Weak lingual manipulation;Reduced posterior propulsion;Delayed oral transit Oral Phase - Comment --  CHL IP PHARYNGEAL PHASE 10/01/2020 Pharyngeal Phase Impaired Pharyngeal- Pudding Teaspoon -- Pharyngeal -- Pharyngeal- Pudding Cup -- Pharyngeal -- Pharyngeal- Honey Teaspoon -- Pharyngeal -- Pharyngeal- Honey Cup -- Pharyngeal -- Pharyngeal- Nectar Teaspoon -- Pharyngeal -- Pharyngeal- Nectar Cup -- Pharyngeal -- Pharyngeal- Nectar Straw Pharyngeal residue - valleculae;Pharyngeal residue - pyriform;Reduced anterior laryngeal mobility Pharyngeal -- Pharyngeal- Thin Teaspoon -- Pharyngeal -- Pharyngeal- Thin Cup Reduced anterior laryngeal mobility;Penetration/Aspiration during swallow;Penetration/Apiration after swallow;Trace aspiration;Pharyngeal residue - valleculae;Pharyngeal residue - pyriform Pharyngeal Material enters airway, passes BELOW cords without attempt by patient to eject out (silent aspiration);Material enters airway, CONTACTS cords and not ejected out Pharyngeal- Thin Straw Delayed swallow initiation-vallecula;Penetration/Aspiration during swallow;Pharyngeal residue - valleculae;Pharyngeal residue - pyriform;Reduced anterior laryngeal mobility;Reduced airway/laryngeal closure Pharyngeal Material enters airway, remains ABOVE vocal cords and not ejected out Pharyngeal- Puree Delayed swallow initiation-vallecula;Pharyngeal residue - pyriform;Pharyngeal residue - valleculae;Reduced anterior laryngeal mobility;Reduced pharyngeal peristalsis Pharyngeal -- Pharyngeal- Mechanical Soft -- Pharyngeal -- Pharyngeal- Regular -- Pharyngeal -- Pharyngeal- Multi-consistency -- Pharyngeal -- Pharyngeal- Pill Delayed swallow initiation-vallecula;Pharyngeal residue - pyriform;Pharyngeal residue - valleculae;Reduced pharyngeal peristalsis Pharyngeal -- Pharyngeal Comment --  CHL IP CERVICAL ESOPHAGEAL PHASE  10/01/2020 Cervical Esophageal Phase Impaired Pudding Teaspoon -- Pudding Cup -- Honey Teaspoon -- Honey Cup -- Nectar Teaspoon -- Nectar Cup -- Nectar Straw Reduced cricopharyngeal relaxation Thin Teaspoon -- Thin Cup Reduced cricopharyngeal relaxation Thin Straw Reduced cricopharyngeal relaxation Puree Reduced cricopharyngeal relaxation Mechanical Soft -- Regular -- Multi-consistency -- Pill Reduced cricopharyngeal relaxation Cervical Esophageal Comment -- Sonia Baller, MA, CCC-SLP Speech Therapy             ECHOCARDIOGRAM COMPLETE  Result Date: 10/01/2020    ECHOCARDIOGRAM REPORT   Patient Name:   [name] Date of Exam: 10/01/2020 Medical Rec #:  [id number]        Height:       72.0 in Accession #:    [id number]       Weight:  147.5 lb Date of Birth:  1926/12/18        BSA:          1.872 m Patient Age:    89 years         BP:           142/62 mmHg Patient Gender: M                HR:           69 bpm. Exam Location:  Inpatient Procedure: 2D Echo, 3D Echo, Color Doppler and Cardiac Doppler Indications:    CHF-Acute Diastolic [id number]  History:        Patient has prior history of Echocardiogram examinations, most                 recent 01/11/2020. CAD, Prior CABG; Risk Factors:Hypertension                 and Dyslipidemia.                 Aortic Valve: 23 mm Edwards pericardial valve is present in the                 aortic position. Procedure Date: 05/21/2010.  Sonographer:    Darlina Sicilian RDCS Referring Phys: [id number] Charlesetta Ivory GONFA IMPRESSIONS  1. Left ventricular ejection fraction, by estimation, is 60 to 65%. The left ventricle has normal function. The left ventricle has no regional wall motion abnormalities. Left ventricular diastolic parameters are consistent with Grade II diastolic dysfunction (pseudonormalization). Elevated left ventricular end-diastolic pressure. The E/e' is 39.  2. Right ventricular systolic function is mildly reduced. The right ventricular size is normal.  3. Left atrial  size was mildly dilated.  4. Right atrial size was mildly dilated.  5. The mitral valve is grossly normal. Mild mitral valve regurgitation.  6. The aortic valve has been repaired/replaced. Aortic valve regurgitation is not visualized. Mild aortic valve sclerosis is present, with no evidence of aortic valve stenosis. There is a 23 mm Edwards pericardial valve present in the aortic position. Procedure Date: 05/21/2010. Aortic valve mean gradient measures 11.0 mmHg. DI is 0.31. EROA is 1.31 cm2. Findings suggest mild prosthetic valve stenosis.  7. The inferior vena cava is normal in size with <50% respiratory variability, suggesting right atrial pressure of 8 mmHg. Comparison(s): 01/11/2020: LVEF 60-65%, bioprosthetic AVR - mean gradient 10 mmHg, DI 0.55. FINDINGS  Left Ventricle: Left ventricular ejection fraction, by estimation, is 60 to 65%. The left ventricle has normal function. The left ventricle has no regional wall motion abnormalities. The left ventricular internal cavity size was normal in size. There is  no left ventricular hypertrophy. Abnormal (paradoxical) septal motion consistent with post-operative status. Left ventricular diastolic parameters are consistent with Grade II diastolic dysfunction (pseudonormalization). Elevated left ventricular end-diastolic pressure. The E/e' is 72. Right Ventricle: The right ventricular size is normal. No increase in right ventricular wall thickness. Right ventricular systolic function is mildly reduced. Left Atrium: Left atrial size was mildly dilated. Right Atrium: Right atrial size was mildly dilated. Pericardium: There is no evidence of pericardial effusion. Mitral Valve: The mitral valve is grossly normal. Mild to moderate mitral annular calcification. Mild mitral valve regurgitation. Tricuspid Valve: The tricuspid valve is grossly normal. Tricuspid valve regurgitation is trivial. Aortic Valve: The aortic valve has been repaired/replaced. Aortic valve regurgitation  is not visualized. Mild aortic valve sclerosis is present, with no evidence of aortic valve stenosis. Aortic valve mean gradient  measures 11.0 mmHg. Aortic valve peak gradient measures 17.1 mmHg. Aortic valve area, by VTI measures 1.31 cm. There is a 23 mm Edwards pericardial valve present in the aortic position. Procedure Date: 05/21/2010. Pulmonic Valve: The pulmonic valve was grossly normal. Pulmonic valve regurgitation is trivial. Aorta: The aortic root and ascending aorta are structurally normal, with no evidence of dilitation. Venous: The inferior vena cava is normal in size with less than 50% respiratory variability, suggesting right atrial pressure of 8 mmHg. IAS/Shunts: No atrial level shunt detected by color flow Doppler.  LEFT VENTRICLE PLAX 2D LVIDd:         5.30 cm  Diastology LVIDs:         4.30 cm  LV e' medial:    3.77 cm/s LV PW:         0.90 cm  LV E/e' medial:  34.5 LV IVS:        0.90 cm  LV e' lateral:   6.25 cm/s LVOT diam:     2.30 cm  LV E/e' lateral: 20.8 LV SV:         64 LV SV Index:   34 LVOT Area:     4.15 cm  RIGHT VENTRICLE RV S prime:     9.81 cm/s TAPSE (M-mode): 1.9 cm LEFT ATRIUM             Index       RIGHT ATRIUM           Index LA diam:        4.90 cm 2.62 cm/m  RA Area:     22.30 cm LA Vol (A2C):   64.7 ml 34.56 ml/m RA Volume:   63.80 ml  34.08 ml/m LA Vol (A4C):   61.0 ml 32.56 ml/m LA Biplane Vol: 66.5 ml 35.53 ml/m  AORTIC VALVE                    PULMONIC VALVE AV Area (Vmax):    1.37 cm     PV Vmax:          2.17 m/s AV Area (Vmean):   1.25 cm     PV Peak grad:     18.8 mmHg AV Area (VTI):     1.31 cm     PR End Diast Vel: 7.95 msec AV Vmax:           207.00 cm/s AV Vmean:          156.000 cm/s AV VTI:            0.487 m AV Peak Grad:      17.1 mmHg AV Mean Grad:      11.0 mmHg LVOT Vmax:         68.50 cm/s LVOT Vmean:        47.100 cm/s LVOT VTI:          0.153 m LVOT/AV VTI ratio: 0.31  AORTA Ao Root diam: 2.80 cm Ao Asc diam:  3.30 cm MITRAL VALVE MV Area  (PHT): 4.39 cm     SHUNTS MV Decel Time: 173 msec     Systemic VTI:  0.15 m MV E velocity: 130.00 cm/s  Systemic Diam: 2.30 cm MV A velocity: 53.30 cm/s MV E/A ratio:  2.44 Lyman Bishop MD Electronically signed by Lyman Bishop MD Signature Date/Time: 10/01/2020/3:22:44 PM    Final    CT Angio Chest/Abd/Pel for Dissection W and/or Wo Contrast  Result Date: 09/30/2020 CLINICAL DATA:  Abdominal  pain. Elevated troponin. Clinical concern for aortic dissection. EXAM: CT ANGIOGRAPHY CHEST, ABDOMEN AND PELVIS TECHNIQUE: Non-contrast CT of the chest was initially obtained. Multidetector CT imaging through the chest, abdomen and pelvis was performed using the standard protocol during bolus administration of intravenous contrast. Multiplanar reconstructed images and MIPs were obtained and reviewed to evaluate the vascular anatomy. CONTRAST:  38m OMNIPAQUE IOHEXOL 350 MG/ML SOLN COMPARISON:  Chest CT obtained without contrast earlier today and abdomen and pelvis CT obtained with contrast earlier today. FINDINGS: The images are limited by photon starvation and streak artifacts produced by the patient's arms not being raised above his head. CTA CHEST FINDINGS Cardiovascular: Median sternotomy wires, post CABG changes and prosthetic aortic valve. Aortic and coronary artery calcifications. No aortic aneurysm or dissection. Mildly enlarged heart with left ventricular and biatrial enlargement. Mediastinum/Nodes: No enlarged mediastinal, hilar, or axillary lymph nodes. Thyroid gland, trachea, and esophagus demonstrate no significant findings. Lungs/Pleura: Stable moderate right and small left pleural effusions, prominent pulmonary vasculature, mildly prominent interstitial markings, diffuse peribronchial thickening and mild bilateral dependent atelectasis. Musculoskeletal: Thoracic and lower cervical spine degenerative changes, including changes of DISH. Review of the MIP images confirms the above findings. CTA ABDOMEN AND  PELVIS FINDINGS VASCULAR Aorta: Extensive calcified and noncalcified mural plaque formation in the abdominal aorta, most pronounced in the infrarenal region. In that area, this is causing approximately 50% luminal narrowing. No aneurysm or acute dissection seen. Celiac: Calcified and noncalcified plaque at the origin producing greater than 90% stenosis. Normally opacified distally. SMA: Calcified plaque formation at the origin and proximally, producing approximately 50% stenosis. Renals: Dense calcified plaque formation at the origins causing approximately 90% or greater stenosis bilaterally. IMA: Noncalcified plaque at the origin producing approximately 90% stenosis. Normally opacified distally. Inflow: Bilateral common iliac calcified plaque formation causing about 50% luminal stenosis on the left and 20% luminal stenosis on the right. There is also mild calcified plaque formation involving the internal iliac arteries bilaterally. The external iliac arteries are unremarkable. There is some calcified plaque involving both common femoral arteries without significant luminal stenosis. Veins: No obvious venous abnormality within the limitations of this arterial phase study. Review of the MIP images confirms the above findings. NON-VASCULAR Hepatobiliary: No focal liver abnormality is seen. No gallstones, gallbladder wall thickening, or biliary dilatation. Pancreas: Unremarkable. No pancreatic ductal dilatation or surrounding inflammatory changes. Spleen: Mildly prominent without abnormal enlargement. Adrenals/Urinary Tract: Normal appearing adrenal glands. Small bilateral renal calculi. Excreted contrast in the urinary bladder. Unremarkable ureters. Stomach/Bowel: Multiple colonic diverticula without evidence of diverticulitis. Normal appearing appendix. Unremarkable small bowel and stomach. Lymphatic: No enlarged lymph nodes. Reproductive: Moderately enlarged prostate gland. Other: Small to moderate-sized left  inguinal hernia containing fat and small right inguinal hernia containing fat. Musculoskeletal: Lumbar spine degenerative changes. Review of the MIP images confirms the above findings. IMPRESSION: 1. No aortic aneurysm or dissection. 2. Extensive arterial atheromatous changes causing approximately 50% luminal stenosis of the infrarenal abdominal aorta, greater than 90% stenosis of the proximal celiac axis, approximately 50% stenosis of the proximal SMA, approximately 90% or greater stenosis of the renal arteries bilaterally and approximately 90% stenosis of the proximal IMA. 3. Stable changes of congestive heart failure including bilateral pleural effusions, interstitial pulmonary edema, pulmonary vascular congestion and bronchitic changes. 4. Colonic diverticulosis. 5. Bilateral nonobstructing renal calculi. Electronically Signed   By: SClaudie ReveringM.D.   On: 09/30/2020 17:36   DG Hip Unilat W or Wo Pelvis 2-3 Views Left  Result Date: 09/29/2020  CLINICAL DATA:  Fall EXAM: DG HIP (WITH OR WITHOUT PELVIS) 2-3V LEFT COMPARISON:  09/10/2020 FINDINGS: Left SI joint is non widened. No definitive fracture or malalignment. Moderate hip arthritis. IMPRESSION: No definite acute osseous abnormality. Electronically Signed   By: Donavan Foil M.D.   On: 09/29/2020 19:04   DG Hip Unilat W or Wo Pelvis 2-3 Views Left  Result Date: 09/10/2020 CLINICAL DATA:  Left hip pain after fall today. EXAM: DG HIP (WITH OR WITHOUT PELVIS) 2-3V LEFT COMPARISON:  None. FINDINGS: There is no evidence of hip fracture or dislocation. Mild narrowing with osteophyte formation is noted. IMPRESSION: Mild degenerative joint disease of the left hip. No acute abnormality is noted. Electronically Signed   By: Marijo Conception M.D.   On: 09/10/2020 12:17     Time coordinating discharge: Over 30 minutes    Dwyane Dee, MD  [facility] 10/03/2020, 10:48 AM

## 2020-10-03 NOTE — Progress Notes (Signed)
Attempted to call report x2 to [facility] [phone]. VM left second time. Patient 5th in line on PTAR's list. Will continue to monitor.

## 2020-10-03 NOTE — TOC Transition Note (Addendum)
Transition of Care [facility]) - CM/SW Discharge Note   Patient Details  Name: [name] MRN: [id number] Date of Birth: 1926/04/07  [facility]) CM/SW Contact:  Dessa Phi, RN Phone Number: 10/03/2020, 12:49 PM   Clinical Narrative:  d/c back to [facility]-return back w/[facility]-order faxed with confirmation. PTAR called  returning to [address],confirmed address. No further CM needs.  2:22p-Nsg call report tel#[phone]. Faxed w/confirmation d/c summary.     Final next level of care: [place] Barriers to Discharge: No Barriers Identified   Patient Goals and CMS Choice Patient states their goals for this hospitalization and ongoing recovery are:: return back to [facility]      Discharge Placement                Patient to be transferred to facility by: [place] Name of family member notified: Shanon Brow son Patient and family notified of of transfer: 10/03/20  Discharge Plan and Services   Discharge Planning Services: CM Consult                      HH Arranged: PT [facility] Agency: Other - See comment Secondary school teacher) Date HH Agency Contacted: 10/03/20 Time [place]: 1247 Representative spoke with at [place]: [place] ([place]) Interventions     Readmission Risk Interventions No flowsheet data found.

## 2020-10-05 ENCOUNTER — Inpatient Hospital Stay (HOSPITAL_COMMUNITY)
Admission: EM | Admit: 2020-10-05 | Discharge: 2020-10-11 | DRG: 640 | Disposition: A | Discharge status: Skilled Nursing Facility | Payer: Medicare Other | Attending: Internal Medicine | Admitting: Internal Medicine

## 2020-10-05 ENCOUNTER — Emergency Department (HOSPITAL_COMMUNITY): Payer: Medicare Other

## 2020-10-05 ENCOUNTER — Other Ambulatory Visit: Payer: Self-pay

## 2020-10-05 ENCOUNTER — Encounter (HOSPITAL_COMMUNITY): Payer: Self-pay

## 2020-10-05 DIAGNOSIS — R413 Other amnesia: Secondary | ICD-10-CM | POA: Diagnosis present

## 2020-10-05 DIAGNOSIS — Z20822 Contact with and (suspected) exposure to covid-19: Secondary | ICD-10-CM | POA: Diagnosis present

## 2020-10-05 DIAGNOSIS — R296 Repeated falls: Secondary | ICD-10-CM | POA: Diagnosis present

## 2020-10-05 DIAGNOSIS — Z953 Presence of xenogenic heart valve: Secondary | ICD-10-CM

## 2020-10-05 DIAGNOSIS — Z66 Do not resuscitate: Secondary | ICD-10-CM | POA: Diagnosis present

## 2020-10-05 DIAGNOSIS — Y92009 Unspecified place in unspecified non-institutional (private) residence as the place of occurrence of the external cause: Secondary | ICD-10-CM

## 2020-10-05 DIAGNOSIS — E785 Hyperlipidemia, unspecified: Secondary | ICD-10-CM | POA: Diagnosis present

## 2020-10-05 DIAGNOSIS — M5030 Other cervical disc degeneration, unspecified cervical region: Secondary | ICD-10-CM | POA: Diagnosis present

## 2020-10-05 DIAGNOSIS — N400 Enlarged prostate without lower urinary tract symptoms: Secondary | ICD-10-CM | POA: Diagnosis present

## 2020-10-05 DIAGNOSIS — Z8249 Family history of ischemic heart disease and other diseases of the circulatory system: Secondary | ICD-10-CM

## 2020-10-05 DIAGNOSIS — Z7982 Long term (current) use of aspirin: Secondary | ICD-10-CM

## 2020-10-05 DIAGNOSIS — Z87891 Personal history of nicotine dependence: Secondary | ICD-10-CM

## 2020-10-05 DIAGNOSIS — Z823 Family history of stroke: Secondary | ICD-10-CM

## 2020-10-05 DIAGNOSIS — R531 Weakness: Secondary | ICD-10-CM | POA: Diagnosis present

## 2020-10-05 DIAGNOSIS — Z681 Body mass index (BMI) 19 or less, adult: Secondary | ICD-10-CM

## 2020-10-05 DIAGNOSIS — L89312 Pressure ulcer of right buttock, stage 2: Secondary | ICD-10-CM | POA: Diagnosis present

## 2020-10-05 DIAGNOSIS — D72829 Elevated white blood cell count, unspecified: Secondary | ICD-10-CM | POA: Diagnosis present

## 2020-10-05 DIAGNOSIS — N1832 Chronic kidney disease, stage 3b: Secondary | ICD-10-CM | POA: Diagnosis present

## 2020-10-05 DIAGNOSIS — R4182 Altered mental status, unspecified: Secondary | ICD-10-CM | POA: Diagnosis present

## 2020-10-05 DIAGNOSIS — Z951 Presence of aortocoronary bypass graft: Secondary | ICD-10-CM

## 2020-10-05 DIAGNOSIS — Z713 Dietary counseling and surveillance: Secondary | ICD-10-CM

## 2020-10-05 DIAGNOSIS — S50312A Abrasion of left elbow, initial encounter: Secondary | ICD-10-CM | POA: Diagnosis present

## 2020-10-05 DIAGNOSIS — I130 Hypertensive heart and chronic kidney disease with heart failure and stage 1 through stage 4 chronic kidney disease, or unspecified chronic kidney disease: Secondary | ICD-10-CM | POA: Diagnosis present

## 2020-10-05 DIAGNOSIS — I214 Non-ST elevation (NSTEMI) myocardial infarction: Secondary | ICD-10-CM | POA: Diagnosis present

## 2020-10-05 DIAGNOSIS — I10 Essential (primary) hypertension: Secondary | ICD-10-CM | POA: Diagnosis present

## 2020-10-05 DIAGNOSIS — W19XXXA Unspecified fall, initial encounter: Secondary | ICD-10-CM | POA: Diagnosis present

## 2020-10-05 DIAGNOSIS — E860 Dehydration: Principal | ICD-10-CM | POA: Diagnosis present

## 2020-10-05 DIAGNOSIS — E43 Unspecified severe protein-calorie malnutrition: Secondary | ICD-10-CM | POA: Diagnosis present

## 2020-10-05 DIAGNOSIS — I5030 Unspecified diastolic (congestive) heart failure: Secondary | ICD-10-CM | POA: Diagnosis present

## 2020-10-05 DIAGNOSIS — I21A1 Myocardial infarction type 2: Secondary | ICD-10-CM | POA: Diagnosis present

## 2020-10-05 DIAGNOSIS — S50311A Abrasion of right elbow, initial encounter: Secondary | ICD-10-CM | POA: Diagnosis present

## 2020-10-05 DIAGNOSIS — Z8673 Personal history of transient ischemic attack (TIA), and cerebral infarction without residual deficits: Secondary | ICD-10-CM

## 2020-10-05 DIAGNOSIS — I2510 Atherosclerotic heart disease of native coronary artery without angina pectoris: Secondary | ICD-10-CM | POA: Diagnosis present

## 2020-10-05 DIAGNOSIS — L89322 Pressure ulcer of left buttock, stage 2: Secondary | ICD-10-CM | POA: Diagnosis present

## 2020-10-05 DIAGNOSIS — Z79899 Other long term (current) drug therapy: Secondary | ICD-10-CM

## 2020-10-05 DIAGNOSIS — I5032 Chronic diastolic (congestive) heart failure: Secondary | ICD-10-CM | POA: Diagnosis present

## 2020-10-05 DIAGNOSIS — D75839 Thrombocytosis, unspecified: Secondary | ICD-10-CM | POA: Diagnosis present

## 2020-10-05 DIAGNOSIS — F0390 Unspecified dementia without behavioral disturbance: Secondary | ICD-10-CM | POA: Diagnosis present

## 2020-10-05 DIAGNOSIS — F05 Delirium due to known physiological condition: Secondary | ICD-10-CM | POA: Diagnosis present

## 2020-10-05 DIAGNOSIS — N179 Acute kidney failure, unspecified: Secondary | ICD-10-CM | POA: Diagnosis present

## 2020-10-05 DIAGNOSIS — Z5309 Procedure and treatment not carried out because of other contraindication: Secondary | ICD-10-CM

## 2020-10-05 DIAGNOSIS — R1312 Dysphagia, oropharyngeal phase: Secondary | ICD-10-CM | POA: Diagnosis present

## 2020-10-05 DIAGNOSIS — D631 Anemia in chronic kidney disease: Secondary | ICD-10-CM | POA: Diagnosis present

## 2020-10-05 LAB — CBC WITH DIFFERENTIAL/PLATELET
Abs Immature Granulocytes: 0 10*3/uL (ref 0.00–0.07)
Basophils Absolute: 0.3 10*3/uL — ABNORMAL HIGH (ref 0.0–0.1)
Basophils Relative: 1 %
Eosinophils Absolute: 0.9 10*3/uL — ABNORMAL HIGH (ref 0.0–0.5)
Eosinophils Relative: 3 %
HCT: 39.2 % (ref 39.0–52.0)
Hemoglobin: 12.8 g/dL — ABNORMAL LOW (ref 13.0–17.0)
Lymphocytes Relative: 6 %
Lymphs Abs: 1.9 10*3/uL (ref 0.7–4.0)
MCH: 30 pg (ref 26.0–34.0)
MCHC: 32.7 g/dL (ref 30.0–36.0)
MCV: 91.8 fL (ref 80.0–100.0)
Monocytes Absolute: 0.9 10*3/uL (ref 0.1–1.0)
Monocytes Relative: 3 %
Neutro Abs: 27.4 10*3/uL — ABNORMAL HIGH (ref 1.7–7.7)
Neutrophils Relative %: 87 %
Platelets: 1004 10*3/uL (ref 150–400)
RBC: 4.27 MIL/uL (ref 4.22–5.81)
RDW: 18.8 % — ABNORMAL HIGH (ref 11.5–15.5)
WBC: 31.5 10*3/uL — ABNORMAL HIGH (ref 4.0–10.5)
nRBC: 0 % (ref 0.0–0.2)
nRBC: 0 /100 WBC

## 2020-10-05 LAB — BASIC METABOLIC PANEL
Anion gap: 9 (ref 5–15)
BUN: 28 mg/dL — ABNORMAL HIGH (ref 8–23)
CO2: 23 mmol/L (ref 22–32)
Calcium: 8.7 mg/dL — ABNORMAL LOW (ref 8.9–10.3)
Chloride: 110 mmol/L (ref 98–111)
Creatinine, Ser: 1.66 mg/dL — ABNORMAL HIGH (ref 0.61–1.24)
GFR, Estimated: 39 mL/min — ABNORMAL LOW (ref >60)
Glucose, Bld: 105 mg/dL — ABNORMAL HIGH (ref 70–99)
Potassium: 4.6 mmol/L (ref 3.5–5.1)
Sodium: 142 mmol/L (ref 135–145)

## 2020-10-05 LAB — RESP PANEL BY RT-PCR (FLU A&B, COVID) ARPGX2
Influenza A by PCR: NEGATIVE
Influenza B by PCR: NEGATIVE
SARS Coronavirus 2 by RT PCR: NEGATIVE

## 2020-10-05 LAB — CULTURE, BLOOD (ROUTINE X 2)
Culture: NO GROWTH
Culture: NO GROWTH
Special Requests: ADEQUATE
Special Requests: ADEQUATE

## 2020-10-05 LAB — TSH: TSH: 0.823 u[IU]/mL (ref 0.350–4.500)

## 2020-10-05 LAB — TROPONIN I (HIGH SENSITIVITY)
Troponin I (High Sensitivity): 395 ng/L (ref <18)
Troponin I (High Sensitivity): 332 ng/L (ref <18)

## 2020-10-05 LAB — MAGNESIUM: Magnesium: 2.5 mg/dL — ABNORMAL HIGH (ref 1.7–2.4)

## 2020-10-05 LAB — BRAIN NATRIURETIC PEPTIDE: B Natriuretic Peptide: 1839 pg/mL — ABNORMAL HIGH (ref 0.0–100.0)

## 2020-10-05 NOTE — ED Provider Notes (Signed)
[facility] EMERGENCY DEPARTMENT Provider Note   CSN: [id number] Arrival date & time: 10/05/20  1206     History Chief Complaint  Patient presents with   Weakness    [name] is a 87 y.o. male.  Pt is a 87 yo male with PMH of dementia presenting from senior assisted living center [facility] for generalized weakness. Pt admits to generalized weakness x 1 week. Denies chest pain or sob. States he fell last week, on 09/30/2020, and was seen in ED with stable workup. Denies black or bloody stools. Denies fevers, chills, coughing, or vomiting.   Attempt made to reach wife. Wife is also pt at senior assisted living center with dementia/poor historian. Attempt made to reach [facility] representative. No one available at this time to give information on pt.   The history is provided by the patient. No language interpreter was used.  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[place])    Retinal tear    Severe aortic stenosis 2010   S/P AVR-TISSUE VALVE    Patient Active Problem List   Diagnosis Date Noted   Dementia without behavioral disturbance ([place]) 10/03/2020   Leukocytosis 10/03/2020   NSTEMI (non-ST elevated myocardial infarction) ([place]) 09/30/2020   Pressure injury of skin 09/30/2020   Pain due to onychomycosis of toenails of both feet 07/13/2019   History of bradycardia 08/17/2018   Pleural effusion, left    Pleural effusion     Empyema, left ([place]) 05/04/2015   Empyema ([place])    CAP (community acquired pneumonia) 04/29/2015   Sepsis ([place]) 04/29/2015   Dyspnea 04/29/2015   Acute kidney injury ([place]) 04/29/2015   Tobacco abuse 04/29/2015   Bradycardia 03/08/2015   Coronary atherosclerosis of native coronary artery 01/25/2013   Aortic valve disorder 01/25/2013   Mixed hyperlipidemia 01/25/2013   Essential hypertension, benign 01/25/2013   Memory difficulty 10/19/2012    Past Surgical History:  Procedure Laterality Date   2 VESSEL CABG     AORTIC VALVE REPLACEMENT     BARTLE     CATARACT EXTRACTION, BILATERAL     RT 2010 LT 2014   TONSILLECTOMY         Family History  Problem Relation Age of Onset   Stroke Father    Cancer Brother    Hypertension Brother    Heart attack Neg Hx     Social History   Tobacco Use   Smoking status: Some Days    Types: Pipe   Smokeless tobacco: Never   Tobacco comments:    PIPE  Substance Use Topics   Alcohol use: No    Comment: RARELY   Drug use: No    Home Medications Prior to Admission medications   Medication Sig Start Date End Date Taking? Authorizing Provider  aspirin EC 81 MG tablet Take 81 mg by mouth daily.   Yes  [provider]  amLODipine (NORVASC) 10 MG tablet Take 1 tablet (10 mg total) by mouth daily. 10/03/20   Dwyane Dee, MD  amoxicillin (AMOXIL) 500 MG tablet TAKE 4 TABLETS BY MOUTH 1 HOUR BEFORE DENTAL PROCEDURE Patient taking differently: Take 2,000 mg by mouth See admin instructions. Take 2,000 mg by mouth one hour before dental procedures 05/26/17   Jettie Booze, MD  atorvastatin (LIPITOR) 20 MG tablet Take 1 tablet (20 mg total) by mouth daily. 10/03/20   Dwyane Dee, MD  dorzolamide-timolol (COSOPT) 22.3-6.8 MG/ML ophthalmic solution 1 drop 2 (two) times daily. 06/06/20   [provider]  furosemide (LASIX) 20 MG tablet Take 1 tablet (20 mg total) by mouth daily. 10/03/20 10/03/21  Dwyane Dee, MD  latanoprost  (XALATAN) 0.005 % ophthalmic solution 1 drop daily. 07/12/19   [provider]  Multiple Vitamins-Minerals (MULTIVITAMIN WITH MINERALS) tablet Take 1 tablet by mouth daily.    [provider]    Allergies    Patient has no known allergies.  Review of Systems   Review of Systems  Constitutional:  Negative for chills and fever.  HENT:  Negative for ear pain and sore throat.   Eyes:  Negative for pain and visual disturbance.  Respiratory:  Negative for cough and shortness of breath.   Cardiovascular:  Negative for chest pain and palpitations.  Gastrointestinal:  Negative for abdominal pain and vomiting.  Genitourinary:  Negative for dysuria and hematuria.  Musculoskeletal:  Negative for arthralgias and back pain.  Skin:  Negative for color change and rash.  Neurological:  Positive for weakness. Negative for seizures and syncope.  All other systems reviewed and are negative.  Physical Exam Updated Vital Signs BP (!) 145/102   Pulse 70   Temp 97.8 F (36.6 C) (Oral)   Resp (!) 24   Ht 6' (1.829 m)   Wt 66.9 kg   SpO2 97%   BMI 20.00 kg/m   Physical Exam Vitals and nursing note reviewed.  Constitutional:      Appearance: He is well-developed.  HENT:     Head: Normocephalic and atraumatic.  Eyes:     Conjunctiva/sclera: Conjunctivae normal.  Cardiovascular:     Rate and Rhythm: Normal rate and regular rhythm.     Heart sounds: No murmur heard. Pulmonary:     Effort: Pulmonary effort is normal. No respiratory distress.     Breath sounds: Normal breath sounds.  Abdominal:     Palpations: Abdomen is soft.     Tenderness: There is no abdominal tenderness.  Musculoskeletal:     Cervical back: Neck supple.  Skin:    General: Skin is warm and dry.  Neurological:     Mental Status: He is alert. He is confused.     GCS: GCS eye subscore is 4. GCS verbal subscore is 5. GCS motor subscore is 6.     Comments: Pt knows he is at [place] to  self Thinks its [phone]    ED Results / Procedures / Treatments   Labs (all labs ordered are listed, but only abnormal results are displayed) Labs Reviewed  RESP PANEL BY RT-PCR (FLU A&B, COVID) ARPGX2  CBC WITH DIFFERENTIAL/PLATELET  BASIC METABOLIC PANEL  BRAIN NATRIURETIC PEPTIDE  MAGNESIUM  TSH  URINALYSIS, ROUTINE W REFLEX MICROSCOPIC  TROPONIN I (HIGH SENSITIVITY)  TROPONIN I (HIGH SENSITIVITY)    EKG None  Radiology DG Chest Portable 1 View  Result Date: 10/05/2020 CLINICAL DATA:  Fall 1 week  ago.  Weakness. EXAM: PORTABLE CHEST 1 VIEW COMPARISON:  09/30/2020 FINDINGS: Postop median sternotomy and aortic valve replacement. CABG changes. Mild perihilar airspace disease has improved consistent with clearing edema. Mild left lower lobe atelectasis and small left effusion. IMPRESSION: Interval improvement in congestive heart failure and edema. Electronically Signed   By: Franchot Gallo M.D.   On: 10/05/2020 14:25    Procedures Procedures   Medications Ordered in ED Medications - No data to display  ED Course  I have reviewed the triage vital signs and the nursing notes.  Pertinent labs & imaging results that were available during my care of the patient were reviewed by me and considered in my medical decision making (see chart for details).    MDM Rules/Calculators/A&P                          3:32 PM 87 yo male with PMH of dementia presenting for generalized weakness. Patient is Aox2, no acute distress, afebrile, with stable vitals.   Stable EKG with no ST segment elevation or depression.   Patient signed out to oncoming provider while awaiting labs/images.        Final Clinical Impression(s) / ED Diagnoses Final diagnoses:  Generalized weakness    Rx / DC Orders ED Discharge Orders     None        Lianne Cure, DO [id number]

## 2020-10-05 NOTE — ED Provider Notes (Signed)
3:59 PM Care assumed from Dr. Pearline Cables.  At time of transfer care, patient is awaiting for results of laboratory testing after being evaluated for 1 week of generalized weakness.  Given patient's otherwise reported well appearance, if work-up is reassuring, patient may be candidate for discharge home and PCP follow-up.  10:25 PM Work-up returned and was overall improved from prior.  Troponin was elevated but compared to recent numbers over 2000 this is improved and downtrending.  BNP is also improved.  Magnesium is not low.  Similar anemia and white blood cell count of prior and platelets are slightly higher.  COVID and flu negative.  Urinalysis is still waiting to be collected in the setting of this fatigue.  EKG did show some T wave inversions which are new compared to prior.  I spoke with cardiology who was going to see the patient given the new T wave inversions and recent NSTEMI last week.  Anticipate reassessment after urinalysis is completed.   [name], Gwenyth Allegra, MD 10/06/20 [phone]

## 2020-10-05 NOTE — ED Triage Notes (Signed)
Pt bib [facility] from [place] where he normally takes care of his wife. Pt has had increased weakness x 1 week and was seen around a week ago for a fall. Hx of dementia.  EMS VSS: 170/92,72 HR, 98% RA, 130 CBG

## 2020-10-05 NOTE — ED Notes (Signed)
Platelets 1,004 and troponin 395. MD Tegeler made aware.

## 2020-10-06 ENCOUNTER — Observation Stay (HOSPITAL_COMMUNITY): Payer: Medicare Other

## 2020-10-06 ENCOUNTER — Emergency Department (HOSPITAL_COMMUNITY): Payer: Medicare Other

## 2020-10-06 DIAGNOSIS — R531 Weakness: Secondary | ICD-10-CM | POA: Diagnosis not present

## 2020-10-06 DIAGNOSIS — I5032 Chronic diastolic (congestive) heart failure: Secondary | ICD-10-CM | POA: Diagnosis not present

## 2020-10-06 DIAGNOSIS — I10 Essential (primary) hypertension: Secondary | ICD-10-CM | POA: Diagnosis not present

## 2020-10-06 DIAGNOSIS — I214 Non-ST elevation (NSTEMI) myocardial infarction: Secondary | ICD-10-CM | POA: Diagnosis not present

## 2020-10-06 DIAGNOSIS — D75839 Thrombocytosis, unspecified: Secondary | ICD-10-CM

## 2020-10-06 DIAGNOSIS — D72829 Elevated white blood cell count, unspecified: Secondary | ICD-10-CM

## 2020-10-06 DIAGNOSIS — I2510 Atherosclerotic heart disease of native coronary artery without angina pectoris: Secondary | ICD-10-CM

## 2020-10-06 DIAGNOSIS — R413 Other amnesia: Secondary | ICD-10-CM

## 2020-10-06 DIAGNOSIS — I5030 Unspecified diastolic (congestive) heart failure: Secondary | ICD-10-CM | POA: Diagnosis present

## 2020-10-06 LAB — URINALYSIS, ROUTINE W REFLEX MICROSCOPIC
Bacteria, UA: NONE SEEN
Bilirubin Urine: NEGATIVE
Glucose, UA: NEGATIVE mg/dL
Hgb urine dipstick: NEGATIVE
Ketones, ur: 5 mg/dL — AB
Leukocytes,Ua: NEGATIVE
Nitrite: NEGATIVE
Protein, ur: 30 mg/dL — AB
Specific Gravity, Urine: 1.013 (ref 1.005–1.030)
pH: 7 (ref 5.0–8.0)

## 2020-10-06 MED ORDER — ACETAMINOPHEN 650 MG RE SUPP
650.0000 mg | Freq: Four times a day (QID) | RECTAL | Status: DC | PRN
Start: 2020-10-06 — End: 2020-10-07

## 2020-10-06 MED ORDER — FUROSEMIDE 20 MG PO TABS
20.0000 mg | ORAL_TABLET | Freq: Every day | ORAL | Status: DC
  Administered 2020-10-06: 20 mg via ORAL
  Filled 2020-10-06 (×2): qty 1

## 2020-10-06 MED ORDER — ASPIRIN EC 81 MG PO TBEC
81.0000 mg | DELAYED_RELEASE_TABLET | Freq: Every day | ORAL | Status: DC
  Administered 2020-10-06 – 2020-10-11 (×5): 81 mg via ORAL
  Filled 2020-10-06 (×6): qty 1

## 2020-10-06 MED ORDER — ACETAMINOPHEN 325 MG PO TABS: 650.0000 mg | ORAL_TABLET | Freq: Four times a day (QID) | ORAL | Status: DC | PRN

## 2020-10-06 MED ORDER — AMLODIPINE BESYLATE 10 MG PO TABS
10.0000 mg | ORAL_TABLET | Freq: Every day | ORAL | Status: DC
  Administered 2020-10-06 – 2020-10-11 (×5): 10 mg via ORAL
  Filled 2020-10-06 (×4): qty 1
  Filled 2020-10-06: qty 2
  Filled 2020-10-06: qty 1

## 2020-10-06 MED ORDER — SODIUM CHLORIDE 0.9% FLUSH
3.0000 mL | Freq: Two times a day (BID) | INTRAVENOUS | Status: DC
  Administered 2020-10-06 – 2020-10-11 (×8): 3 mL via INTRAVENOUS

## 2020-10-06 MED ORDER — ATORVASTATIN CALCIUM 10 MG PO TABS
20.0000 mg | ORAL_TABLET | Freq: Every day | ORAL | Status: DC
  Administered 2020-10-06 – 2020-10-11 (×5): 20 mg via ORAL
  Filled 2020-10-06 (×6): qty 2

## 2020-10-06 MED ORDER — LATANOPROST 0.005 % OP SOLN
1.0000 [drp] | Freq: Every day | OPHTHALMIC | Status: DC
  Administered 2020-10-06 – 2020-10-10 (×5): 1 [drp] via OPHTHALMIC
  Filled 2020-10-06 (×2): qty 2.5

## 2020-10-06 MED ORDER — ALBUTEROL SULFATE (2.5 MG/3ML) 0.083% IN NEBU: 2.5000 mg | INHALATION_SOLUTION | Freq: Four times a day (QID) | RESPIRATORY_TRACT | Status: DC | PRN

## 2020-10-06 MED ORDER — ENOXAPARIN SODIUM 30 MG/0.3ML IJ SOSY
30.0000 mg | PREFILLED_SYRINGE | INTRAMUSCULAR | Status: DC
  Administered 2020-10-06 – 2020-10-11 (×6): 30 mg via SUBCUTANEOUS
  Filled 2020-10-06 (×6): qty 0.3

## 2020-10-06 NOTE — ED Provider Notes (Signed)
  Physical Exam  BP (!) 171/72   Pulse 72   Temp 97.8 F (36.6 C) (Oral)   Resp (!) 21   Ht 1.829 m (6')   Wt 66.9 kg   SpO2 97%   BMI 20.00 kg/m   Physical Exam  ED Course/Procedures     Procedures  MDM  87 yo hos recent fall and nstemi, recently discharged back to independent living. Troponins elevated by trending down Confused on exam MRI pending Hospitalist consult ordered Dr. Alcario Drought discussed with Dr. Ralene Bathe and day hospitalist team to see.       Pattricia Boss, MD 10/06/20 [phone]

## 2020-10-06 NOTE — ED Provider Notes (Signed)
Patient care assumed at 2330. Patient here for generalized weakness from nursing facility. Urinalysis and cardiology consulted pending.  Cardiology has evaluated the patient and feels that the troponins are improving and does not need further cardiac evaluation at this time.   UA is not consistent with UTI. On assessment at the bedside patient is drowsy, very difficult to understand speech, profound generalized weakness.  Reached out to staff at [facility]. Staff this evening are unclear why he was sent into the hospital. They state the only reasons that patients are sent in is if they had a fall or if they were too weak to care for themselves. They state that he is currently in independent living and is not appropriate for that level of care and will not accept him back.    CT head and plain films are negative for acute abnormality. Medicine consulted for admission.  On repeat assessment patient is more awake and alert. He states that he only had one significant fall that resulted in significant low back pain. He states that he did not have another fall yesterday but that he was too weak and let himself down to the floor. He does report intermittent neck pain, persistent low back pain. On examination he is awake and alert. He has 4/5 strength in all four extremities. Plan to obtain MRI to further evaluate for subacute CVA as well as evidence of cord compression. Patient care transferred pending additional imaging.   Quintella Reichert, MD 10/06/20 [phone]

## 2020-10-06 NOTE — ED Notes (Signed)
Inpatient MD at bedside assessing pt and updating pt's family member via phone on plan of care.

## 2020-10-06 NOTE — ED Notes (Signed)
Patient transported to CT 

## 2020-10-06 NOTE — ED Notes (Signed)
Breakfast tray ordered 

## 2020-10-06 NOTE — TOC Initial Note (Signed)
Transition of Care [facility]) - Initial/Assessment Note    Patient Details  Name: [name] MRN: [id number] Date of Birth: [birth year]  [facility] CM/SW Contact:    Verdell Carmine, RN Phone Number: 10/06/2020, 5:05 PM  Clinical Narrative:                  87 year old patient living at [name] Had a syncopal event, fall,  ( slowly to the ground) MRI shows infarct. PT consulted as will likely need SNF rehabilitation, Had home health set up 4 days ago for PT with the [place]. And has had increased weakness since . Patient uses walker.   Will await PT recommendations and CM will follow for needs, recommendations and transitions.   Expected Discharge Plan: Skilled Nursing Facility Barriers to Discharge: Continued Medical Work up   Patient Goals and CMS Choice        Expected Discharge Plan and Services Expected Discharge Plan: [place]   Discharge Planning Services: CM Consult   Living arrangements for the past 2 months: [place]                                      Prior Living Arrangements/Services Living arrangements for the past 2 months: [place] Lives with:: Facility Resident Patient language and need for interpreter reviewed:: Yes        Need for Family Participation in Patient Care: Yes (Comment) Care giver support system in place?: Yes (comment)   Criminal Activity/Legal Involvement Pertinent to Current Situation/Hospitalization: No - Comment as needed  Activities of Daily Living      Permission Sought/Granted                  Emotional Assessment       Orientation: : Oriented to Self Alcohol / Substance Use: Not Applicable    Admission diagnosis:  Weakness [R53.1] Patient Active Problem List   Diagnosis Date Noted   Weakness 10/06/2020   Dementia without behavioral disturbance ([place]) 10/03/2020   Leukocytosis 10/03/2020   NSTEMI (non-ST elevated myocardial  infarction) ([place]) 09/30/2020   Pressure injury of skin 09/30/2020   Pain due to onychomycosis of toenails of both feet 07/13/2019   History of bradycardia 08/17/2018   Pleural effusion, left    Pleural effusion    Empyema, left ([place]) 05/04/2015   Empyema ([place])    CAP (community acquired pneumonia) 04/29/2015   Sepsis ([place]) 04/29/2015   Dyspnea 04/29/2015   Acute kidney injury ([place]) 04/29/2015   Tobacco abuse 04/29/2015   Bradycardia 03/08/2015   Coronary atherosclerosis of native coronary artery 01/25/2013   Aortic valve disorder 01/25/2013   Mixed hyperlipidemia 01/25/2013   Essential hypertension, benign 01/25/2013   Memory difficulty 10/19/2012   PCP:  Lavone Orn, MD Pharmacy:   [facility], [place] - [address] [place] [address] Phone: [phone] Fax: [phone]     Social Determinants of Health (SDOH) Interventions    Readmission Risk Interventions No flowsheet data found.

## 2020-10-06 NOTE — ED Notes (Signed)
Pt transported to MRI via stretcher at this time.  °

## 2020-10-06 NOTE — Consult Note (Signed)
Cardiology Consultation:   Patient ID: [name] MRN: [id number]; DOB: 1926/11/10  Admit date: 10/05/2020 Date of Consult: 10/06/2020  PCP:  Lavone Orn, MD   [facility] Providers Cardiologist:  Larae Grooms, MD        Patient Profile:   [name] is a 87 y.o. male with a hx of dementia, CAD s/p CABG (2010), AAS s/p AVR, HTN, HFpEF, and BPH who is being seen 10/06/2020 for the evaluation of elevated troponins at the request of Dr. Harrell Gave Tegeler.  History of Present Illness:   The following history was obtained primarily from chart review and some from the patient.  The patient is unfortunately a very poor historian.  [name] was recently hospitalized from 9/11-9/14 after sustaining a fall and being found down for extended period of time.  I was notable during that admission was that he was found to have an NSTEMI which was favored to be secondary to trauma related to his fall and demand ischemia.  TTE at that time was negative for [name] but his troponins were markedly elevated to greater than 2000.  Patient was chest pain-free throughout that hospitalization.  Given that his clinical symptomatology and echo are reassuring, it was favor not to pursue any invasive measures from a cardiovascular standpoint.  The patient return to his assisted living facility, but yesterday was brought back to the ED for 1 week of generalized weakness.  Unfortunately, the precise details of why his facility felt the need to bring him back are unclear to me because I am unable to get this history from the patient.  When I spoke to the patient on examination, he endorses midthoracic back pain that has been present since his fall and generalized weakness, but otherwise has no complaints.  He denies fevers, chills, chest pain, shortness of breath, nausea, vomiting, diarrhea, abdominal pain, focal weakness, focal numbness, palpitations, swelling, orthopnea, or PND.  In the ED his VS  were afebrile, BP 163/64, heart rate 67, RR 16 and was satting 90% on RA.  Labs notable for WBC 31.5 (chronically elevated), platelet count 1004, creatinine 1.66, BNP 1839 (down from 6 days ago) and troponins 395 -> 332 (down from > 2000 6 days ago).  CXR noted improved pulmonary edema from prior.  CT head was negative for acute intracranial processes.  Thoracic and lumbar imaging was negative for fracture.  EKG showed new TWI in II, III, aVF, V4-V6, but no ST elevations.  Given this constellation of findings cardiology was consulted for evaluation.   Past Medical History:  Diagnosis Date   BPH (benign prostatic hyperplasia)    Coronary artery disease with hx of myocardial infarct w/o hx of CABG 2010   Dehydration    Diverticulosis    HOH (hard of hearing)    hearing aids   Hyperlipidemia    Hypertension    Impaired fasting glucose    Memory difficulty 10/19/2012   Memory loss    Mesenteric panniculitis ([place])    Retinal tear    Severe aortic stenosis 2010   S/P AVR-TISSUE VALVE    Past Surgical History:  Procedure Laterality Date   2 VESSEL CABG     AORTIC VALVE REPLACEMENT     BARTLE     CATARACT EXTRACTION, BILATERAL     RT 2010 LT 2014   TONSILLECTOMY       Home Medications:  Prior to Admission medications   Medication Sig Start Date End Date Taking? Authorizing Provider  acetaminophen (  TYLENOL) 500 MG tablet Take 500 mg by mouth every 6 (six) hours as needed for moderate pain or headache.   Yes [provider]  amLODipine (NORVASC) 10 MG tablet Take 1 tablet (10 mg total) by mouth daily. 10/03/20  Yes Dwyane Dee, MD  amoxicillin (AMOXIL) 500 MG tablet TAKE 4 TABLETS BY MOUTH 1 HOUR BEFORE DENTAL PROCEDURE Patient taking differently: Take 2,000 mg by mouth See admin instructions. Take 2,000 mg by mouth one hour before dental procedures 05/26/17  Yes Jettie Booze, MD  aspirin EC 81 MG tablet Take 81 mg by mouth daily.   Yes [provider]   atorvastatin (LIPITOR) 20 MG tablet Take 1 tablet (20 mg total) by mouth daily. 10/03/20  Yes Dwyane Dee, MD  furosemide (LASIX) 20 MG tablet Take 1 tablet (20 mg total) by mouth daily. 10/03/20 10/03/21 Yes Dwyane Dee, MD  latanoprost (XALATAN) 0.005 % ophthalmic solution Place 1 drop into both eyes at bedtime. 07/12/19  Yes [provider]  Multiple Vitamins-Minerals (MULTIVITAMIN WITH MINERALS) tablet Take 1 tablet by mouth daily.   Yes [provider]    Inpatient Medications: Scheduled Meds:  Continuous Infusions:  PRN Meds:   Allergies:   No Known Allergies  Social History:   Social History   Socioeconomic History   Marital status: Married    Spouse name: Not on file   Number of children: 3   Years of education: BA   Highest education level: Not on file  Occupational History   Occupation: Retired  Tobacco Use   Smoking status: Some Days    Types: Pipe   Smokeless tobacco: Never   Tobacco comments:    PIPE  Substance and Sexual Activity   Alcohol use: No    Comment: RARELY   Drug use: No   Sexual activity: Not on file  Other Topics Concern   Not on file  Social History Narrative   Patient lives at home with his wife [name])   Retired.   Education college B.S.   Right handed.   Caffeine - Decaf coffee   Social Determinants of Health   Financial Resource Strain: Not on file  Food Insecurity: Not on file  Transportation Needs: Not on file  Physical Activity: Not on file  Stress: Not on file  Social Connections: Not on file  Intimate Partner Violence: Not on file    Family History:    Family History  Problem Relation Age of Onset   Stroke Father    Cancer Brother    Hypertension Brother    Heart attack Neg Hx      ROS:  Please see the history of present illness.   All other ROS reviewed and negative.     Physical Exam/Data:   Vitals:   10/06/20 0200 10/06/20 0230 10/06/20 0407 10/06/20 0430  BP: (!) 181/69 (!)  182/68 (!) 186/80 (!) 181/70  Pulse: 69  67 70  Resp: '15 15 18 16  '$ Temp:   97.8 F (36.6 C)   TempSrc:   Oral   SpO2: 96%  97% 97%  Weight:      Height:        Intake/Output Summary (Last 24 hours) at 10/06/2020 0518 Last data filed at 10/06/2020 0223 Gross per 24 hour  Intake --  Output 150 ml  Net -150 ml   Last 3 Weights 10/05/2020 09/30/2020 09/29/2020  Weight (lbs) 147 lb 7.8 oz 147 lb 7.8 oz 156 lb 8.4 oz  Weight (  kg) 66.9 kg 66.9 kg 71 kg     Body mass index is 20 kg/m.  General: Chronically ill-appearing elderly gentleman in NAD HEENT: Dry MM Neck: no JVD Vascular: 2+ radial pulses bilaterally Cardiac:  normal S1, S2; RRR; II/VI systolic murmur heard best at LUSB, no rubs or gallops Lungs:  clear to auscultation bilaterally, no wheezing, rhonchi or rales  Abd: soft, nontender, no hepatomegaly, abdominal bruit Ext: no edema Musculoskeletal:  No deformities, BUE and BLE strength normal and equal Skin: warm and dry  Neuro:  A&O x2 Psych:  Normal affect   EKG:  The EKG was personally reviewed and demonstrates:  NSR with TWI in II, III, aVF, V4-V6    Telemetry:  Telemetry was personally reviewed and demonstrates:  same as ECG  Relevant CV Studies:  TTE 10/01/20:  IMPRESSIONS     1. Left ventricular ejection fraction, by estimation, is 60 to 65%. The  left ventricle has normal function. The left ventricle has no regional  wall motion abnormalities. Left ventricular diastolic parameters are  consistent with Grade II diastolic  dysfunction (pseudonormalization). Elevated left ventricular end-diastolic  pressure. The E/e' is 38.   2. Right ventricular systolic function is mildly reduced. The right  ventricular size is normal.   3. Left atrial size was mildly dilated.   4. Right atrial size was mildly dilated.   5. The mitral valve is grossly normal. Mild mitral valve regurgitation.   6. The aortic valve has been repaired/replaced. Aortic valve  regurgitation is  not visualized. Mild aortic valve sclerosis is present,  with no evidence of aortic valve stenosis. There is a 23 mm Edwards  pericardial valve present in the aortic position.  Procedure Date: 05/21/2010. Aortic valve mean gradient measures 11.0 mmHg.  DI is 0.31. EROA is 1.31 cm2. Findings suggest mild prosthetic valve  stenosis.   7. The inferior vena cava is normal in size with <50% respiratory  variability, suggesting right atrial pressure of 8 mmHg.   Laboratory Data:  High Sensitivity Troponin:   Recent Labs  Lab 09/30/20 1141 09/30/20 1411 10/05/20 1347 10/05/20 1745  TROPONINIHS 1,908* 2,850* 395* 332*     Chemistry Recent Labs  Lab 10/02/20 0403 10/03/20 0420 10/05/20 1347  NA 139 142 142  K 3.5 3.8 4.6  CL 107 108 110  CO2 '24 24 23  '$ GLUCOSE 102* 106* 105*  BUN 36* 33* 28*  CREATININE 1.68* 1.49* 1.66*  CALCIUM 8.5* 8.5* 8.7*  MG  --   --  2.5*  GFRNONAA 38* 44* 39*  ANIONGAP '8 10 9    '$ Recent Labs  Lab 09/30/20 1141  PROT 7.9  ALBUMIN 4.0  AST 56*  ALT 20  ALKPHOS 89  BILITOT 1.5*   Lipids No results for input(s): CHOL, TRIG, HDL, LABVLDL, LDLCALC, CHOLHDL in the last 168 hours.  Hematology Recent Labs  Lab 10/02/20 0403 10/03/20 0420 10/05/20 1347  WBC 24.0* 26.9* 31.5*  RBC 3.63* 4.00* 4.27  HGB 10.7* 11.8* 12.8*  HCT 32.4* 35.8* 39.2  MCV 89.3 89.5 91.8  MCH 29.5 29.5 30.0  MCHC 33.0 33.0 32.7  RDW 18.6* 18.5* 18.8*  PLT 709* 883* 1,004*   Thyroid  Recent Labs  Lab 10/05/20 1347  TSH 0.823    BNP Recent Labs  Lab 09/30/20 1141 10/05/20 1347  BNP 2,356.4* 1,839.0*    DDimer No results for input(s): DDIMER in the last 168 hours.   Radiology/Studies:  DG Thoracic Spine 2 View  Result Date:  10/06/2020 CLINICAL DATA:  Back pain related to fall EXAM: THORACIC SPINE 2 VIEWS; LUMBAR SPINE - COMPLETE 4+ VIEW COMPARISON:  CT 09/30/2020 FINDINGS: Diffuse bridging osteophytes/syndesmophytes in the thoracic spine as confirmed by CT.  In the lumbar spine there is intermittent intervertebral ankylosis from bulky osteophyte. Prominent degenerative facet spurring inferiorly in the lumbar spine. No visible acute fracture or subluxation. Maintained posterior mediastinal fat planes. Generalized osteopenia. IMPRESSION: 1. No acute finding in the thoracic or lumbar spine. 2. Diffuse thoracic ankylosis. Electronically Signed   By: Jorje Guild M.D.   On: 10/06/2020 04:07   DG Lumbar Spine Complete  Result Date: 10/06/2020 CLINICAL DATA:  Back pain related to fall EXAM: THORACIC SPINE 2 VIEWS; LUMBAR SPINE - COMPLETE 4+ VIEW COMPARISON:  CT 09/30/2020 FINDINGS: Diffuse bridging osteophytes/syndesmophytes in the thoracic spine as confirmed by CT. In the lumbar spine there is intermittent intervertebral ankylosis from bulky osteophyte. Prominent degenerative facet spurring inferiorly in the lumbar spine. No visible acute fracture or subluxation. Maintained posterior mediastinal fat planes. Generalized osteopenia. IMPRESSION: 1. No acute finding in the thoracic or lumbar spine. 2. Diffuse thoracic ankylosis. Electronically Signed   By: Jorje Guild M.D.   On: 10/06/2020 04:07   CT Head Wo Contrast  Result Date: 10/06/2020 CLINICAL DATA:  Head trauma EXAM: CT HEAD WITHOUT CONTRAST CT CERVICAL SPINE WITHOUT CONTRAST TECHNIQUE: Multidetector CT imaging of the head and cervical spine was performed following the standard protocol without intravenous contrast. Multiplanar CT image reconstructions of the cervical spine were also generated. COMPARISON:  None. FINDINGS: CT HEAD FINDINGS Brain: There is no mass, hemorrhage or extra-axial collection. The size and configuration of the ventricles and extra-axial CSF spaces are normal. The brain parenchyma is normal, without evidence of acute or chronic infarction. Vascular: No abnormal hyperdensity of the major intracranial arteries or dural venous sinuses. No intracranial atherosclerosis. Skull: The  visualized skull base, calvarium and extracranial soft tissues are normal. Sinuses/Orbits: No fluid levels or advanced mucosal thickening of the visualized paranasal sinuses. No mastoid or middle ear effusion. The orbits are normal. CT CERVICAL SPINE FINDINGS Alignment: No static subluxation. Facets are aligned. Occipital condyles are normally positioned. Skull base and vertebrae: No acute fracture. Soft tissues and spinal canal: No prevertebral fluid or swelling. No visible canal hematoma. Disc levels: Multilevel facet arthrosis. Upper chest: Right pleural effusion Other: Normal visualized paraspinal cervical soft tissues. IMPRESSION: 1. No acute intracranial abnormality. 2. No acute fracture or static subluxation of the cervical spine. 3. Right pleural effusion. Electronically Signed   By: Ulyses Jarred M.D.   On: 10/06/2020 03:48   CT Cervical Spine Wo Contrast  Result Date: 10/06/2020 CLINICAL DATA:  Head trauma EXAM: CT HEAD WITHOUT CONTRAST CT CERVICAL SPINE WITHOUT CONTRAST TECHNIQUE: Multidetector CT imaging of the head and cervical spine was performed following the standard protocol without intravenous contrast. Multiplanar CT image reconstructions of the cervical spine were also generated. COMPARISON:  None. FINDINGS: CT HEAD FINDINGS Brain: There is no mass, hemorrhage or extra-axial collection. The size and configuration of the ventricles and extra-axial CSF spaces are normal. The brain parenchyma is normal, without evidence of acute or chronic infarction. Vascular: No abnormal hyperdensity of the major intracranial arteries or dural venous sinuses. No intracranial atherosclerosis. Skull: The visualized skull base, calvarium and extracranial soft tissues are normal. Sinuses/Orbits: No fluid levels or advanced mucosal thickening of the visualized paranasal sinuses. No mastoid or middle ear effusion. The orbits are normal. CT CERVICAL SPINE FINDINGS Alignment: No static  subluxation. Facets are aligned.  Occipital condyles are normally positioned. Skull base and vertebrae: No acute fracture. Soft tissues and spinal canal: No prevertebral fluid or swelling. No visible canal hematoma. Disc levels: Multilevel facet arthrosis. Upper chest: Right pleural effusion Other: Normal visualized paraspinal cervical soft tissues. IMPRESSION: 1. No acute intracranial abnormality. 2. No acute fracture or static subluxation of the cervical spine. 3. Right pleural effusion. Electronically Signed   By: Ulyses Jarred M.D.   On: 10/06/2020 03:48   DG Chest Portable 1 View  Result Date: 10/05/2020 CLINICAL DATA:  Fall 1 week ago.  Weakness. EXAM: PORTABLE CHEST 1 VIEW COMPARISON:  09/30/2020 FINDINGS: Postop median sternotomy and aortic valve replacement. CABG changes. Mild perihilar airspace disease has improved consistent with clearing edema. Mild left lower lobe atelectasis and small left effusion. IMPRESSION: Interval improvement in congestive heart failure and edema. Electronically Signed   By: Franchot Gallo M.D.   On: 10/05/2020 14:25     Assessment and Plan:   [name] is a 87 y.o. male with a hx of dementia, CAD s/p CABG (2010), AS s/p AVR, HTN, HFpEF, and BPH who is being seen 10/06/2020 for the evaluation of elevated troponins at the request of Dr. Harrell Gave Tegeler.  #Myocardial Injury from Recent NSTEMI :: Patient presenting with 1 week of generalized weakness but without cardiopulmonary symptoms.  Troponins were obtained nonetheless and are elevated to the 300s.  Keep in mind however, the patient was just discharged from the hospital a few days ago where his troponins were greater than 2000.  Recall that troponins will take up to a week to normalize after myocardial injury, so it is not unexpected for troponins to still be elevated.  I think the important thing to note is that they are downtrending.  Regarding the T wave inversions, not entirely sure what is causing these, but they could simply be the  result of his recent NSTEMI.  Regardless, no additional ischemic evaluation is warranted at this time particularly in this patient with multiple comorbidities.  I recommend further work-up of his generalized weakness and look for another source aside the cardiovascular system.     #CAD #HLD -continue ASA 81 mg daily -continue atorvastatin 20 mg daily  #HTN -continue amlodipine 10   Risk Assessment/Risk Scores:         For questions or updates, please contact [place] Please consult [email or web address] for contact info under    Signed, Hershal Coria, MD  10/06/2020 5:18 AM

## 2020-10-06 NOTE — H&P (Signed)
History and Physical    [name] [id number] DOB: [birth year] DOA: 10/05/2020  Referring MD/NP/PA: Pryor Curia, MD PCP: Lavone Orn, MD  Patient coming from: [facility] (where patient previously took care of his wife) via EMS  Chief Complaint: Weakness  I have personally briefly reviewed patient's old medical records in [place]   HPI: [name] is a 87 y.o. male with medical history significant of hypertension, CAD s/p CABG in 2010, AMS s/p AVR, dementia, and BPH presents with complaints of weakness.  Patient had just recently been hospitalized 9/11-9/14 after having a fall found to have a NSTEMI and acute diastolic CHF exacerbation.  Patient had been evaluated by cardiology and echocardiogram noted a preserved EF with grade 2 diastolic dysfunction.  He started on Lasix 20 mg daily at discharge to follow-up with Dr. Irish Lack in 2 weeks. Family reports that once he go back to the facility 3 days ago he was evaluated and medial condition was reported to the family be more severe than he thought.  When he was initially discharged they were told that he needed rehab, but felt that he could likely do that in independent living with home health.  Before coming into the hospital patient had been the primary care provider guider for his wife who is bedridden there at [facility].  After he was assessed then noted that his medical condition was worse than they realized they were in the process of trying to get him placed, but when staff checked on him noted that his blood pressure was 170/92 and EMS was called.  Patient reports that he somehow ended up on the floor and was unable to get back up and ended up sleeping there until someone came by.  Is not totally clear whether he is talking about now or a week ago.  At this time patient  denies any complaints of any chest pain, shortness of breath, cough, nausea, vomiting, or diarrhea.  His sons and confirmed that  the patient has a DO NOT RESUSCITATE order in place.  They also make note that although he had not been formally diagnosed with muscular dystrophy all of their children have it.  ED Course: Upon admission into the emergency department patient was seen to be afebrile with respirations 10-24, blood pressure 145/102-180 6/80, and O2 saturation maintained on room air.  Labs significant for WBC 31.5, hemoglobin 12.8, platelets 1004, BUN 28, creatinine 1.66 CT scan of the head and cervical spine noted no acute intercranial abnormality and signs of right-sided pleural effusion.  Chest x-ray noted interval improvement in congestive heart failure and edema.  X-rays of the back noted no acute findings of the thoracic or lumbar spine and diffuse thoracic ankylosis.  Cardiology has been consulted due to EKG showing new T wave inversions in 2 3 and aVF V4-V6, but no ST elevation.  Cardiology recommended continuation with current medication management at this time and no need for further cardiac work-up.  MRI of the brain, cervical spine, and lumbar spine have been ordered.  Review of Systems  Constitutional:  Positive for malaise/fatigue. Negative for fever.  Eyes:  Negative for photophobia and pain.  Respiratory:  Negative for cough.   Cardiovascular:  Negative for chest pain and leg swelling.  Gastrointestinal:  Negative for abdominal pain, diarrhea, nausea and vomiting.  Musculoskeletal:  Positive for falls and joint pain.  Neurological:  Positive for weakness.  Psychiatric/Behavioral:  Negative for substance abuse.   All other  systems reviewed and are negative.  Past Medical History:  Diagnosis Date   BPH (benign prostatic hyperplasia)    Coronary artery disease with hx of myocardial infarct w/o hx of CABG 2010   Dehydration    Diverticulosis    HOH (hard of hearing)    hearing aids   Hyperlipidemia    Hypertension    Impaired fasting glucose    Memory difficulty 10/19/2012   Memory loss     Mesenteric panniculitis ([place])    Retinal tear    Severe aortic stenosis 2010   S/P AVR-TISSUE VALVE    Past Surgical History:  Procedure Laterality Date   2 VESSEL CABG     AORTIC VALVE REPLACEMENT     BARTLE     CATARACT EXTRACTION, BILATERAL     RT 2010 LT 2014   TONSILLECTOMY       reports that he has been smoking pipe. He has never used smokeless tobacco. He reports that he does not drink alcohol and does not use drugs.  No Known Allergies  Family History  Problem Relation Age of Onset   Stroke Father    Cancer Brother    Hypertension Brother    Heart attack Neg Hx     Prior to Admission medications   Medication Sig Start Date End Date Taking? Authorizing Provider  acetaminophen (TYLENOL) 500 MG tablet Take 500 mg by mouth every 6 (six) hours as needed for moderate pain or headache.   Yes [provider]  amLODipine (NORVASC) 10 MG tablet Take 1 tablet (10 mg total) by mouth daily. 10/03/20  Yes Dwyane Dee, MD  amoxicillin (AMOXIL) 500 MG tablet TAKE 4 TABLETS BY MOUTH 1 HOUR BEFORE DENTAL PROCEDURE Patient taking differently: Take 2,000 mg by mouth See admin instructions. Take 2,000 mg by mouth one hour before dental procedures 05/26/17  Yes Jettie Booze, MD  aspirin EC 81 MG tablet Take 81 mg by mouth daily.   Yes [provider]  atorvastatin (LIPITOR) 20 MG tablet Take 1 tablet (20 mg total) by mouth daily. 10/03/20  Yes Dwyane Dee, MD  furosemide (LASIX) 20 MG tablet Take 1 tablet (20 mg total) by mouth daily. 10/03/20 10/03/21 Yes Dwyane Dee, MD  latanoprost (XALATAN) 0.005 % ophthalmic solution Place 1 drop into both eyes at bedtime. 07/12/19  Yes [provider]  Multiple Vitamins-Minerals (MULTIVITAMIN WITH MINERALS) tablet Take 1 tablet by mouth daily.   Yes [provider]    Physical Exam:  Constitutional: Elderly male who appears to be in no acute distress Vitals:   10/06/20 0530 10/06/20 0600 10/06/20 0700  10/06/20 0715  BP: (!) 171/67 (!) 171/72 (!) 169/68 (!) 177/70  Pulse: 70 72 67 69  Resp: 17 (!) '21 14 18  '$ Temp:      TempSrc:      SpO2: 97% 97% 96% 97%  Weight:      Height:       Eyes: PERRL, lids and conjunctivae normal ENMT: Mucous membranes are moist. Posterior pharynx clear of any exudate or lesions.  Neck: normal and supple  No JVD Respiratory: clear to auscultation bilaterally, no wheezing, no crackles. Normal respiratory effort. No accessory muscle use.  O2 saturation currently maintained on room air. Cardiovascular: Regular rate and rhythm, 2/6 systolic ejection murmur heard. Abdomen: no tenderness, no masses palpated. No hepatosplenomegaly. Bowel sounds positive.  Musculoskeletal: no clubbing / cyanosis.  Decreased range of motion of the back. Skin: Abrasions noted to bilateral elbows currently  managed Neurologic: CN 2-12 grossly intact.  Strength 4/5 in all 4 extremities Psychiatric:  Alert and although responses are slowed in responses able to tell me that he is in [facility], year is 2022(previously reported as 1951 to the ED provider), and he is here because he was on the floor and could not get up.  Flat affect.    Labs on Admission: I have personally reviewed following labs and imaging studies  CBC: Recent Labs  Lab 09/29/20 1740 09/30/20 1141 10/01/20 0422 10/02/20 0403 10/03/20 0420 10/05/20 1347  WBC 32.8* 43.6* 31.1* 24.0* 26.9* 31.5*  NEUTROABS 26.5* 36.9*  --   --   --  27.4*  HGB 12.2* 12.8* 11.2* 10.7* 11.8* 12.8*  HCT 38.5* 38.6* 33.3* 32.4* 35.8* 39.2  MCV 91.9 89.8 89.3 89.3 89.5 91.8  PLT 745* 756* 682* 709* 883* [id number]*   Basic Metabolic Panel: Recent Labs  Lab 09/30/20 1141 10/01/20 0422 10/02/20 0403 10/03/20 0420 10/05/20 1347  NA 143 139 139 142 142  K 4.5 3.9 3.5 3.8 4.6  CL 110 107 107 108 110  CO2 '23 24 24 24 23  '$ GLUCOSE 125* 112* 102* 106* 105*  BUN 38* 38* 36* 33* 28*  CREATININE 1.78* 1.78* 1.68* 1.49* 1.66*  CALCIUM 9.4  8.5* 8.5* 8.5* 8.7*  MG  --   --   --   --  2.5*   GFR: Estimated Creatinine Clearance: 27.4 mL/min (A) (by C-G formula based on SCr of 1.66 mg/dL (H)). Liver Function Tests: Recent Labs  Lab 09/30/20 1141  AST 56*  ALT 20  ALKPHOS 89  BILITOT 1.5*  PROT 7.9  ALBUMIN 4.0   No results for input(s): LIPASE, AMYLASE in the last 168 hours. No results for input(s): AMMONIA in the last 168 hours. Coagulation Profile: No results for input(s): INR, PROTIME in the last 168 hours. Cardiac Enzymes: Recent Labs  Lab 09/29/20 1740  CKTOTAL 102   BNP (last 3 results) No results for input(s): PROBNP in the last 8760 hours. HbA1C: No results for input(s): HGBA1C in the last 72 hours. CBG: No results for input(s): GLUCAP in the last 168 hours. Lipid Profile: No results for input(s): CHOL, HDL, LDLCALC, TRIG, CHOLHDL, LDLDIRECT in the last 72 hours. Thyroid Function Tests: Recent Labs    10/05/20 1347  TSH 0.823   Anemia Panel: No results for input(s): VITAMINB12, FOLATE, FERRITIN, TIBC, IRON, RETICCTPCT in the last 72 hours. Urine analysis:    Component Value Date/Time   COLORURINE YELLOW 10/06/2020 0203   APPEARANCEUR CLEAR 10/06/2020 0203   LABSPEC 1.013 10/06/2020 0203   PHURINE 7.0 10/06/2020 0203   GLUCOSEU NEGATIVE 10/06/2020 0203   HGBUR NEGATIVE 10/06/2020 0203   BILIRUBINUR NEGATIVE 10/06/2020 0203   KETONESUR 5 (A) 10/06/2020 0203   PROTEINUR 30 (A) 10/06/2020 0203   UROBILINOGEN 1.0 11/05/2013 2131   NITRITE NEGATIVE 10/06/2020 0203   LEUKOCYTESUR NEGATIVE 10/06/2020 0203   Sepsis Labs: Recent Results (from the past 240 hour(s))  Resp Panel by RT-PCR (Flu A&B, Covid) Nasopharyngeal Swab     Status: None   Collection Time: 09/30/20 11:41 AM   Specimen: Nasopharyngeal Swab; Nasopharyngeal(NP) swabs in vial transport medium  Result Value Ref Range Status   SARS Coronavirus 2 by RT PCR NEGATIVE NEGATIVE Final    Comment: (NOTE) SARS-CoV-2 target nucleic acids  are NOT DETECTED.  The SARS-CoV-2 RNA is generally detectable in upper respiratory specimens during the acute phase of infection. The lowest concentration of SARS-CoV-2 viral copies  this assay can detect is 138 copies/mL. A negative result does not preclude SARS-Cov-2 infection and should not be used as the sole basis for treatment or other patient management decisions. A negative result may occur with  improper specimen collection/handling, submission of specimen other than nasopharyngeal swab, presence of viral mutation(s) within the areas targeted by this assay, and inadequate number of viral copies(<138 copies/mL). A negative result must be combined with clinical observations, patient history, and epidemiological information. The expected result is Negative.  Fact Sheet for Patients:  EntrepreneurPulse.com.au  Fact Sheet for Healthcare Providers:  IncredibleEmployment.be  This test is no t yet approved or cleared by the [place] FDA and  has been authorized for detection and/or diagnosis of SARS-CoV-2 by FDA under an Emergency Use Authorization (EUA). This EUA will remain  in effect (meaning this test can be used) for the duration of the COVID-19 declaration under Section 564(b)(1) of the Act, 21 U.S.C.section 360bbb-3(b)(1), unless the authorization is terminated  or revoked sooner.       Influenza A by PCR NEGATIVE NEGATIVE Final   Influenza B by PCR NEGATIVE NEGATIVE Final    Comment: (NOTE) The Xpert Xpress SARS-CoV-2/FLU/RSV plus assay is intended as an aid in the diagnosis of influenza from Nasopharyngeal swab specimens and should not be used as a sole basis for treatment. Nasal washings and aspirates are unacceptable for Xpert Xpress SARS-CoV-2/FLU/RSV testing.  Fact Sheet for Patients: EntrepreneurPulse.com.au  Fact Sheet for Healthcare Providers: IncredibleEmployment.be  This test is  not yet approved or cleared by the [place] FDA and has been authorized for detection and/or diagnosis of SARS-CoV-2 by FDA under an Emergency Use Authorization (EUA). This EUA will remain in effect (meaning this test can be used) for the duration of the COVID-19 declaration under Section 564(b)(1) of the Act, 21 U.S.C. section 360bbb-3(b)(1), unless the authorization is terminated or revoked.  Performed at [facility], [place] [address] [place], [place] [address]   Urine Culture     Status: None   Collection Time: 09/30/20 11:41 AM   Specimen: Urine, Clean Catch  Result Value Ref Range Status   Specimen Description   Final    URINE, CLEAN CATCH Performed at [facility], [place] [address] [place], [place] [address]    Special Requests   Final    NONE Performed at [facility], [place] [address], [place], [place] [address]    Culture   Final    NO GROWTH Performed at [facility], [place] [address], [place], [place] [address]    Report Status 10/01/2020 FINAL  Final  Culture, blood (routine x 2)     Status: None   Collection Time: 09/30/20  6:33 PM   Specimen: BLOOD  Result Value Ref Range Status   Specimen Description   Final    BLOOD LEFT ANTECUBITAL Performed at [place] [address], [place], [place] [address]    Special Requests   Final    BOTTLES DRAWN AEROBIC AND ANAEROBIC Blood Culture adequate volume Performed at [place] [address], [place], [place] [address]    Culture   Final    NO GROWTH 5 DAYS Performed at [facility], [place] [address], [place], [place] [address]    Report Status 10/05/2020 FINAL  Final  Culture, blood (routine x 2)     Status: None   Collection Time: 09/30/20  6:33 PM   Specimen: BLOOD  Result Value Ref Range Status   Specimen Description   Final    BLOOD BLOOD LEFT HAND Performed at [place] [address], [place], [place] [address]    Special Requests   Final    BOTTLES DRAWN AEROBIC AND ANAEROBIC Blood Culture adequate volume Performed at [place] [address], [place], [place] [address]    Culture   Final    NO GROWTH 5 DAYS Performed at [facility], [place] [address] [place], [place] [address]    Report Status 10/05/2020 FINAL  Final  Resp Panel by RT-PCR (Flu A&B, Covid) Nasopharyngeal Swab     Status: None   Collection Time: 10/05/20  1:47 PM   Specimen: Nasopharyngeal Swab; Nasopharyngeal(NP) swabs in vial transport medium  Result Value Ref Range Status   SARS Coronavirus 2 by RT PCR NEGATIVE NEGATIVE Final    Comment: (NOTE) SARS-CoV-2 target nucleic acids are NOT DETECTED.  The SARS-CoV-2 RNA is generally detectable in upper respiratory specimens during the acute phase of infection. The lowest concentration of SARS-CoV-2 viral copies this assay can detect is 138 copies/mL. A negative result does not preclude SARS-Cov-2 infection and should not be used as the sole basis for treatment or other patient management decisions. A negative result may occur with  improper specimen collection/handling, submission of specimen other than nasopharyngeal swab, presence of viral mutation(s) within the areas targeted by this assay, and inadequate number of viral copies(<138 copies/mL). A negative result must be combined with clinical observations, patient history, and epidemiological information. The expected result is Negative.  Fact Sheet for Patients:  EntrepreneurPulse.com.au  Fact Sheet for Healthcare Providers:  IncredibleEmployment.be  This test is no t yet approved or cleared by the [place] FDA and  has been authorized for detection and/or diagnosis of SARS-CoV-2 by FDA under an Emergency Use Authorization (EUA). This EUA will remain  in effect (meaning this test can be used) for the  duration of the COVID-19 declaration under Section 564(b)(1) of the Act, 21 U.S.C.section 360bbb-3(b)(1), unless the authorization is terminated  or revoked sooner.       Influenza A by PCR NEGATIVE NEGATIVE Final   Influenza B by PCR NEGATIVE NEGATIVE Final    Comment: (NOTE) The Xpert Xpress SARS-CoV-2/FLU/RSV plus assay is intended as an aid in the diagnosis of influenza from Nasopharyngeal swab specimens and should not be used as a sole basis for treatment. Nasal washings and aspirates are unacceptable for Xpert Xpress SARS-CoV-2/FLU/RSV testing.  Fact Sheet for Patients: EntrepreneurPulse.com.au  Fact Sheet for Healthcare Providers: IncredibleEmployment.be  This test is not yet approved or cleared by the [place] FDA and has been authorized for detection and/or diagnosis of SARS-CoV-2 by FDA under an Emergency Use Authorization (EUA). This EUA will remain in effect (meaning this test can be used) for the duration of the COVID-19 declaration under Section 564(b)(1) of the Act, 21 U.S.C. section 360bbb-3(b)(1), unless the authorization is terminated or revoked.  Performed at [facility], [place] [address], [place], [place] [address]      Radiological Exams on Admission: DG Thoracic Spine 2 View  Result Date: 10/06/2020 CLINICAL DATA:  Back pain related to fall EXAM: THORACIC SPINE 2 VIEWS; LUMBAR SPINE - COMPLETE 4+ VIEW COMPARISON:  CT 09/30/2020 FINDINGS: Diffuse bridging osteophytes/syndesmophytes in the thoracic spine as confirmed by CT. In the lumbar spine there is intermittent intervertebral ankylosis from bulky osteophyte. Prominent degenerative facet spurring inferiorly in the lumbar  spine. No visible acute fracture or subluxation. Maintained posterior mediastinal fat planes. Generalized osteopenia. IMPRESSION: 1. No acute finding in the thoracic or lumbar spine. 2. Diffuse thoracic ankylosis. Electronically Signed   By:  Jorje Guild M.D.   On: 10/06/2020 04:07   DG Lumbar Spine Complete  Result Date: 10/06/2020 CLINICAL DATA:  Back pain related to fall EXAM: THORACIC SPINE 2 VIEWS; LUMBAR SPINE - COMPLETE 4+ VIEW COMPARISON:  CT 09/30/2020 FINDINGS: Diffuse bridging osteophytes/syndesmophytes in the thoracic spine as confirmed by CT. In the lumbar spine there is intermittent intervertebral ankylosis from bulky osteophyte. Prominent degenerative facet spurring inferiorly in the lumbar spine. No visible acute fracture or subluxation. Maintained posterior mediastinal fat planes. Generalized osteopenia. IMPRESSION: 1. No acute finding in the thoracic or lumbar spine. 2. Diffuse thoracic ankylosis. Electronically Signed   By: Jorje Guild M.D.   On: 10/06/2020 04:07   CT Head Wo Contrast  Result Date: 10/06/2020 CLINICAL DATA:  Head trauma EXAM: CT HEAD WITHOUT CONTRAST CT CERVICAL SPINE WITHOUT CONTRAST TECHNIQUE: Multidetector CT imaging of the head and cervical spine was performed following the standard protocol without intravenous contrast. Multiplanar CT image reconstructions of the cervical spine were also generated. COMPARISON:  None. FINDINGS: CT HEAD FINDINGS Brain: There is no mass, hemorrhage or extra-axial collection. The size and configuration of the ventricles and extra-axial CSF spaces are normal. The brain parenchyma is normal, without evidence of acute or chronic infarction. Vascular: No abnormal hyperdensity of the major intracranial arteries or dural venous sinuses. No intracranial atherosclerosis. Skull: The visualized skull base, calvarium and extracranial soft tissues are normal. Sinuses/Orbits: No fluid levels or advanced mucosal thickening of the visualized paranasal sinuses. No mastoid or middle ear effusion. The orbits are normal. CT CERVICAL SPINE FINDINGS Alignment: No static subluxation. Facets are aligned. Occipital condyles are normally positioned. Skull base and vertebrae: No acute fracture.  Soft tissues and spinal canal: No prevertebral fluid or swelling. No visible canal hematoma. Disc levels: Multilevel facet arthrosis. Upper chest: Right pleural effusion Other: Normal visualized paraspinal cervical soft tissues. IMPRESSION: 1. No acute intracranial abnormality. 2. No acute fracture or static subluxation of the cervical spine. 3. Right pleural effusion. Electronically Signed   By: Ulyses Jarred M.D.   On: 10/06/2020 03:48   CT Cervical Spine Wo Contrast  Result Date: 10/06/2020 CLINICAL DATA:  Head trauma EXAM: CT HEAD WITHOUT CONTRAST CT CERVICAL SPINE WITHOUT CONTRAST TECHNIQUE: Multidetector CT imaging of the head and cervical spine was performed following the standard protocol without intravenous contrast. Multiplanar CT image reconstructions of the cervical spine were also generated. COMPARISON:  None. FINDINGS: CT HEAD FINDINGS Brain: There is no mass, hemorrhage or extra-axial collection. The size and configuration of the ventricles and extra-axial CSF spaces are normal. The brain parenchyma is normal, without evidence of acute or chronic infarction. Vascular: No abnormal hyperdensity of the major intracranial arteries or dural venous sinuses. No intracranial atherosclerosis. Skull: The visualized skull base, calvarium and extracranial soft tissues are normal. Sinuses/Orbits: No fluid levels or advanced mucosal thickening of the visualized paranasal sinuses. No mastoid or middle ear effusion. The orbits are normal. CT CERVICAL SPINE FINDINGS Alignment: No static subluxation. Facets are aligned. Occipital condyles are normally positioned. Skull base and vertebrae: No acute fracture. Soft tissues and spinal canal: No prevertebral fluid or swelling. No visible canal hematoma. Disc levels: Multilevel facet arthrosis. Upper chest: Right pleural effusion Other: Normal visualized paraspinal cervical soft tissues. IMPRESSION: 1. No acute intracranial abnormality. 2. No acute  fracture or static  subluxation of the cervical spine. 3. Right pleural effusion. Electronically Signed   By: Ulyses Jarred M.D.   On: 10/06/2020 03:48   DG Chest Portable 1 View  Result Date: 10/05/2020 CLINICAL DATA:  Fall 1 week ago.  Weakness. EXAM: PORTABLE CHEST 1 VIEW COMPARISON:  09/30/2020 FINDINGS: Postop median sternotomy and aortic valve replacement. CABG changes. Mild perihilar airspace disease has improved consistent with clearing edema. Mild left lower lobe atelectasis and small left effusion. IMPRESSION: Interval improvement in congestive heart failure and edema. Electronically Signed   By: Franchot Gallo M.D.   On: 10/05/2020 14:25    EKG: Independently reviewed.  Sinus rhythm at 68 bpm  Assessment/Plan Weakness: Patient presents with complaints of weakness.  Records note patient evaluated by physical therapy and recommended supervision/assistance 24 hours a day, but this was declined by the patient and family.  However, was unable to care for himself or his wife once he got back to [facility] independent [facility].  -Admit to telemetry bed -PT/OT to eval and treat -Transitions of care consulted for need of placement  Recent NTSEMI CAD: High-sensitivity troponin 395->332.  Patient was noted to have NSTEMI during last hospitalization with high-sensitivity troponins elevated up to 2850.  For which EF was noted to be preserved and no further work-up was recommended at that time.  Cardiology reevaluated  and recommended continuation with current management. -Continue aspirin 81 mg daily and statin -Appreciate cardiology consultative services.  Leukocytosis  thrombocytosis: Acute on chronic. Wbc 31.5 and platelet count 1004.  White blood cell count during last hospitalization had previously been elevated up to 43.5.  Recommended outpatient follow-up with hematology oncology.  No clear infectious cause of symptoms is noted at this time. -F/u peripheral smear -Discussed with Dr. Alen Blew for which there  would be concern for possible myeloproliferative disorder, but does not require any other acute work-up and recommended outpatient follow-up  Heart failure with preserved EF: Patient appears to be currently euvolemic.  Grossly fluid overloaded on physical exam.  Recent echocardiogram from 9/12 noted EF of 60-65% with grade 2 diastolic dysfunction.  BNP was elevated at 1939, but improved from previous admission on the 11th. -Strict I&O's and daily weights -Continue furosemide 20 mg daily  Essential hypertension: Blood pressures initially noted to be -Continue amlodipine 10 mg daily  Memory difficulty: Patient was noted to initially be somewhat confused by ED physician and thought the year was 25.  However on  my evaluation patient was more alert and oriented and noted that the year was 2022.  Likely has some dementia at baseline based off MRI imaging which did not show any signs of a stroke, but did note significant atrophy of the brain. -Delirium precautions  Degenerative disc disease: MRI of the cervical and lumbar spine noted significant degenerative changes with signs of spinal stenosis, but nothing acute.  His son note that his children all have muscular dystrophy although he has never been diagnosis.  Hyperlipidemia -Continue atorvastatin 20 mg daily  DVT prophylaxis: Lovenox Code Status: DNR Family Communication: Son updated over Disposition Plan: Needs to be discharged to SNF/rehab Consults called: Cardiology Admission status: Observation  Norval Morton MD [facility]   If 7PM-7AM, please contact night-coverage   10/06/2020, 7:27 AM

## 2020-10-07 DIAGNOSIS — R531 Weakness: Secondary | ICD-10-CM | POA: Diagnosis not present

## 2020-10-07 LAB — BASIC METABOLIC PANEL
Anion gap: 11 (ref 5–15)
BUN: 26 mg/dL — ABNORMAL HIGH (ref 8–23)
CO2: 23 mmol/L (ref 22–32)
Calcium: 8.8 mg/dL — ABNORMAL LOW (ref 8.9–10.3)
Chloride: 106 mmol/L (ref 98–111)
Creatinine, Ser: 1.45 mg/dL — ABNORMAL HIGH (ref 0.61–1.24)
GFR, Estimated: 45 mL/min — ABNORMAL LOW (ref >60)
Glucose, Bld: 107 mg/dL — ABNORMAL HIGH (ref 70–99)
Potassium: 3.8 mmol/L (ref 3.5–5.1)
Sodium: 140 mmol/L (ref 135–145)

## 2020-10-07 LAB — URINE CULTURE: Culture: NO GROWTH

## 2020-10-07 LAB — SAVE SMEAR(SSMR), FOR PROVIDER SLIDE REVIEW

## 2020-10-07 LAB — CBC
HCT: 40.8 % (ref 39.0–52.0)
Hemoglobin: 13.6 g/dL (ref 13.0–17.0)
MCH: 29.6 pg (ref 26.0–34.0)
MCHC: 33.3 g/dL (ref 30.0–36.0)
MCV: 88.9 fL (ref 80.0–100.0)
Platelets: 1319 10*3/uL (ref 150–400)
RBC: 4.59 MIL/uL (ref 4.22–5.81)
RDW: 18.2 % — ABNORMAL HIGH (ref 11.5–15.5)
WBC: 35 10*3/uL — ABNORMAL HIGH (ref 4.0–10.5)
nRBC: 0 % (ref 0.0–0.2)

## 2020-10-07 LAB — CK: Total CK: 49 U/L (ref 49–397)

## 2020-10-07 MED ORDER — SODIUM CHLORIDE 0.9 % IV SOLN: INTRAVENOUS | Status: DC

## 2020-10-07 NOTE — Progress Notes (Addendum)
[name]  [id number] DOB: [birth year] DOA: 10/05/2020 PCP: Lavone Orn, MD    Brief Narrative:  87 year old with a history of HTN, CAD status post CABG 2010, AOS status post AVR, BPH, and dementia who presented to the ED with severe generalized weakness.  He was hospitalized 9/11 >10/03/2020 after suffering a fall and subsequently being found to have suffered an NSTEMI with acute grade 2 diastolic CHF.  Following that hospital stay he was discharged back to his independent living facility, where it was opined that he required a higher level of care.  EMS was reportedly summoned because his blood pressure was found to be 170/92.  In the ED 9/17 the patient was found to have a blood pressure of 186/80.  WBC was 31.5 with BUN 28 and creatinine 1.66.  CT head and cervical spine revealed no acute abnormality.  CXR noted a right-sided pleural effusion.  X-rays of the thoracic and lumbar spine revealed no acute findings.  EKG noted new T wave inversions in 2 3 and aVF but no ST elevations.   Consultants:  None  Code Status: NO CODE BLUE  Antimicrobials:  None  DVT prophylaxis: Lovenox  Subjective: Blood pressure elevated above goal.  Afebrile.  Saturations stable.  Sedate at the time of my visit.  Does not appear to be in distress.  Will not answer questions.  Is mumbling to himself.  Assessment & Plan:  Deconditioned -severe generalized weakness -recurrent falls Records suggest SNF placement was suggested during prior hospital stay, but family declined and patient was subsequently discharged to independent living where he did not thrive -PT/OT to reevaluate -anticipate SNF placement -no acute finding on MRI brain and cervical spine  Obtundation Suspect patient is simply exhausted -no evidence of instability -monitor -avoid sedating medications -check [id number], folic acid, and ammonia levels in a.m.  Acute kidney injury Clinically appears dehydrated -gently hydrate and follow trend  -perhaps this is contributing to his altered mental status as well  Recent Labs  Lab 10/01/20 0422 10/02/20 0403 10/03/20 0420 10/05/20 1347 10/07/20 0510  CREATININE 1.78* 1.68* 1.49* 1.66* 1.45*     Recent NSTEMI / CAD Cardiology re-evaluated at the time of this a readmission and did not recommend any further interventions  Leukocytosis Appears to be a chronic issue with WBC being up to 43.5 during last hospitalization -outpatient follow-up with hematology has been recommended -no clinical evidence of active infection -myeloproliferative disorder is on the differential but an acute work-up during his hospital stay is not required for this  Chronic diastolic CHF TTE [id number] noted EF 60-65% with grade 2 diastolic dysfunction -euvolemic on exam  HTN Blood pressure poorly controlled -adjust treatment and follow trend  Memory difficulty Possibility of some mild dementia versus acute delirium of acute illness with intermittent confusion noted in the ER -MRI reveals moderate to severe global parenchymal volume loss suggestive of an age-related dementia  HLD Continue atorvastatin   Family Communication:  Status is: Observation  The patient remains OBS appropriate and will d/c before 2 midnights.  Dispo: The patient is from:  ILF              Anticipated d/c is to: SNF              Patient currently is not medically stable to d/c.   Difficult to place patient No    Objective: Blood pressure (!) 174/64, pulse 71, temperature 97.8 F (36.6 C), temperature source Oral, resp. rate 18, height 6' (1.829  m), weight 60.7 kg, SpO2 100 %.  Intake/Output Summary (Last 24 hours) at 10/07/2020 0903 Last data filed at 10/07/2020 0900 Gross per 24 hour  Intake 440 ml  Output 1025 ml  Net -585 ml   Filed Weights   10/05/20 1226 10/06/20 2010  Weight: 66.9 kg 60.7 kg    Examination: General: No acute respiratory distress -lethargic Lungs: Clear to auscultation bilaterally without  wheezes or crackles Cardiovascular: Regular rate and rhythm without murmur gallop or rub normal S1 and S2 Abdomen: Nontender, nondistended, soft, bowel sounds positive, no rebound, no ascites, no appreciable mass Extremities: No significant cyanosis, clubbing, or edema bilateral lower extremities  CBC: Recent Labs  Lab 09/30/20 1141 10/01/20 0422 10/03/20 0420 10/05/20 1347 10/07/20 0510  WBC 43.6*   < > 26.9* 31.5* 35.0*  NEUTROABS 36.9*  --   --  27.4*  --   HGB 12.8*   < > 11.8* 12.8* 13.6  HCT 38.6*   < > 35.8* 39.2 40.8  MCV 89.8   < > 89.5 91.8 88.9  PLT 756*   < > 883* 1,004* 1,319*   < > = values in this interval not displayed.   Basic Metabolic Panel: Recent Labs  Lab 10/03/20 0420 10/05/20 1347 10/07/20 0510  NA 142 142 140  K 3.8 4.6 3.8  CL 108 110 106  CO2 '24 23 23  '$ GLUCOSE 106* 105* 107*  BUN 33* 28* 26*  CREATININE 1.49* 1.66* 1.45*  CALCIUM 8.5* 8.7* 8.8*  MG  --  2.5*  --    GFR: Estimated Creatinine Clearance: 28.5 mL/min (A) (by C-G formula based on SCr of 1.45 mg/dL (H)).  Liver Function Tests: Recent Labs  Lab 09/30/20 1141  AST 56*  ALT 20  ALKPHOS 89  BILITOT 1.5*  PROT 7.9  ALBUMIN 4.0     Cardiac Enzymes: Recent Labs  Lab 10/07/20 0510  CKTOTAL 49    HbA1C: Hgb A1c MFr Bld  Date/Time Value Ref Range Status  04/10/2008 01:43 PM  4.6 - 6.1 % Final   5.4 (NOTE)   The ADA recommends the following therapeutic goal for glycemic   control related to Hgb A1C measurement:   Goal of Therapy:   < 7.0% Hgb A1C   Reference: American Diabetes Association: Clinical Practice   Recommendations 2008, Diabetes Care,  2008, 31:(Suppl 1).     Recent Results (from the past 240 hour(s))  Resp Panel by RT-PCR (Flu A&B, Covid) Nasopharyngeal Swab     Status: None   Collection Time: 09/30/20 11:41 AM   Specimen: Nasopharyngeal Swab; Nasopharyngeal(NP) swabs in vial transport medium  Result Value Ref Range Status   SARS Coronavirus 2 by RT PCR  NEGATIVE NEGATIVE Final    Comment: (NOTE) SARS-CoV-2 target nucleic acids are NOT DETECTED.  The SARS-CoV-2 RNA is generally detectable in upper respiratory specimens during the acute phase of infection. The lowest concentration of SARS-CoV-2 viral copies this assay can detect is 138 copies/mL. A negative result does not preclude SARS-Cov-2 infection and should not be used as the sole basis for treatment or other patient management decisions. A negative result may occur with  improper specimen collection/handling, submission of specimen other than nasopharyngeal swab, presence of viral mutation(s) within the areas targeted by this assay, and inadequate number of viral copies(<138 copies/mL). A negative result must be combined with clinical observations, patient history, and epidemiological information. The expected result is Negative.  Fact Sheet for Patients:  EntrepreneurPulse.com.au  Fact Sheet for Healthcare  Providers:  IncredibleEmployment.be  This test is no t yet approved or cleared by the [place] and  has been authorized for detection and/or diagnosis of SARS-CoV-2 by FDA under an Emergency Use Authorization (EUA). This EUA will remain  in effect (meaning this test can be used) for the duration of the COVID-19 declaration under Section 564(b)(1) of the Act, 21 U.S.C.section 360bbb-3(b)(1), unless the authorization is terminated  or revoked sooner.       Influenza A by PCR NEGATIVE NEGATIVE Final   Influenza B by PCR NEGATIVE NEGATIVE Final    Comment: (NOTE) The Xpert Xpress SARS-CoV-2/FLU/RSV plus assay is intended as an aid in the diagnosis of influenza from Nasopharyngeal swab specimens and should not be used as a sole basis for treatment. Nasal washings and aspirates are unacceptable for Xpert Xpress SARS-CoV-2/FLU/RSV testing.  Fact Sheet for Patients: EntrepreneurPulse.com.au  Fact Sheet for  Healthcare Providers: IncredibleEmployment.be  This test is not yet approved or cleared by the [place] FDA and has been authorized for detection and/or diagnosis of SARS-CoV-2 by FDA under an Emergency Use Authorization (EUA). This EUA will remain in effect (meaning this test can be used) for the duration of the COVID-19 declaration under Section 564(b)(1) of the Act, 21 U.S.C. section 360bbb-3(b)(1), unless the authorization is terminated or revoked.  Performed at [facility], [place] [address], [place], [place] [address]   Urine Culture     Status: None   Collection Time: 09/30/20 11:41 AM   Specimen: Urine, Clean Catch  Result Value Ref Range Status   Specimen Description   Final    URINE, CLEAN CATCH Performed at [facility], [place] [address] [place], [place] [address]    Special Requests   Final    NONE Performed at [facility], [place] [address] [place], [place] [address]    Culture   Final    NO GROWTH Performed at [facility], [place] [address], [place], [place] [address]    Report Status 10/01/2020 FINAL  Final  Culture, blood (routine x 2)     Status: None   Collection Time: 09/30/20  6:33 PM   Specimen: BLOOD  Result Value Ref Range Status   Specimen Description   Final    BLOOD LEFT ANTECUBITAL Performed at [place] [address], [place], [place] [address]    Special Requests   Final    BOTTLES DRAWN AEROBIC AND ANAEROBIC Blood Culture adequate volume Performed at [place] [address], [place], [place] [address]    Culture   Final    NO GROWTH 5 DAYS Performed at [facility], [place] [address], [place], [place] [address]    Report Status 10/05/2020 FINAL  Final  Culture, blood (routine x 2)     Status: None   Collection Time: 09/30/20  6:33 PM   Specimen: BLOOD  Result Value Ref Range Status   Specimen Description    Final    BLOOD BLOOD LEFT HAND Performed at [place] [address] [place], [place] [address]    Special Requests   Final    BOTTLES DRAWN AEROBIC AND ANAEROBIC Blood Culture adequate volume Performed at [place] [address], [place], [place] [address]    Culture   Final    NO GROWTH 5 DAYS Performed at [facility], [place] [address] [place], [place] [address]  Report Status 10/05/2020 FINAL  Final  Resp Panel by RT-PCR (Flu A&B, Covid) Nasopharyngeal Swab     Status: None   Collection Time: 10/05/20  1:47 PM   Specimen: Nasopharyngeal Swab; Nasopharyngeal(NP) swabs in vial transport medium  Result Value Ref Range Status   SARS Coronavirus 2 by RT PCR NEGATIVE NEGATIVE Final    Comment: (NOTE) SARS-CoV-2 target nucleic acids are NOT DETECTED.  The SARS-CoV-2 RNA is generally detectable in upper respiratory specimens during the acute phase of infection. The lowest concentration of SARS-CoV-2 viral copies this assay can detect is 138 copies/mL. A negative result does not preclude SARS-Cov-2 infection and should not be used as the sole basis for treatment or other patient management decisions. A negative result may occur with  improper specimen collection/handling, submission of specimen other than nasopharyngeal swab, presence of viral mutation(s) within the areas targeted by this assay, and inadequate number of viral copies(<138 copies/mL). A negative result must be combined with clinical observations, patient history, and epidemiological information. The expected result is Negative.  Fact Sheet for Patients:  EntrepreneurPulse.com.au  Fact Sheet for Healthcare Providers:  IncredibleEmployment.be  This test is no t yet approved or cleared by the [place] FDA and  has been authorized for detection and/or diagnosis of SARS-CoV-2 by FDA under an Emergency Use Authorization (EUA).  This EUA will remain  in effect (meaning this test can be used) for the duration of the COVID-19 declaration under Section 564(b)(1) of the Act, 21 U.S.C.section 360bbb-3(b)(1), unless the authorization is terminated  or revoked sooner.       Influenza A by PCR NEGATIVE NEGATIVE Final   Influenza B by PCR NEGATIVE NEGATIVE Final    Comment: (NOTE) The Xpert Xpress SARS-CoV-2/FLU/RSV plus assay is intended as an aid in the diagnosis of influenza from Nasopharyngeal swab specimens and should not be used as a sole basis for treatment. Nasal washings and aspirates are unacceptable for Xpert Xpress SARS-CoV-2/FLU/RSV testing.  Fact Sheet for Patients: EntrepreneurPulse.com.au  Fact Sheet for Healthcare Providers: IncredibleEmployment.be  This test is not yet approved or cleared by the [place] FDA and has been authorized for detection and/or diagnosis of SARS-CoV-2 by FDA under an Emergency Use Authorization (EUA). This EUA will remain in effect (meaning this test can be used) for the duration of the COVID-19 declaration under Section 564(b)(1) of the Act, 21 U.S.C. section 360bbb-3(b)(1), unless the authorization is terminated or revoked.  Performed at [facility], [place] [address], [place], [place] [address]   Urine Culture     Status: None   Collection Time: 10/06/20  1:05 AM   Specimen: In/Out Cath Urine  Result Value Ref Range Status   Specimen Description IN/OUT CATH URINE  Final   Special Requests NONE  Final   Culture   Final    NO GROWTH Performed at [facility], [place] [address] [place], [place] [address]    Report Status 10/07/2020 FINAL  Final     Scheduled Meds:  amLODipine  10 mg Oral Daily   aspirin EC  81 mg Oral Daily   atorvastatin  20 mg Oral Daily   enoxaparin (LOVENOX) injection  30 mg Subcutaneous Q24H   furosemide  20 mg Oral Daily   latanoprost  1 drop Both Eyes QHS   sodium chloride flush  3  mL Intravenous Q12H     LOS: 0 days   Cherene Altes, MD [facility] Office  [phone] Pager - Text Page  per Amion  If 7PM-7AM, please contact night-coverage per Amion 10/07/2020, 9:03 AM

## 2020-10-07 NOTE — Care Management Obs Status (Signed)
[place] NOTIFICATION   Patient Details  Name: LABON FONT MRN: [id number] Date of Birth: 1926-03-13   Medicare Observation Status Notification Given:  Yes    Bartholomew Crews, RN 10/07/2020, 3:59 PM

## 2020-10-07 NOTE — Progress Notes (Signed)
OT Cancellation Note  Patient Details Name: [name] MRN: [id number] DOB: 02/17/1926   Cancelled Treatment:    Reason Eval/Treat Not Completed: Fatigue/lethargy limiting ability to participate;Patient's level of consciousness. Pt recently completed PT evaluation and is too fatigued to participate in OT evaluation at this time. OT will attempt tomorrow.   [place] 10/07/2020, 4:28 PM  Jesse Sans OTR/L Acute Rehabilitation Services Pager: [phone] Office: [phone]

## 2020-10-07 NOTE — Progress Notes (Signed)
Patient currently responding verbally will continue to monitor.

## 2020-10-07 NOTE — TOC Initial Note (Signed)
Transition of Care [facility]) - Initial/Assessment Note    Patient Details  Name: [name] MRN: [id number] Date of Birth: 1926-09-02  [facility] CM/SW Contact:    Bartholomew Crews, RN Phone Number: [phone] 10/07/2020, 4:06 PM  Clinical Narrative:                  Spoke with patient's spouse, Annalee Genta, on the phone who became overwhelmed with conversation. CM asked permission to call her son, Shanon Brow, and she eagerly agreed.   Spoke with Shanon Brow at [phone] to discuss transition planning. 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 Patient and spouse live in [facility] living at [facility], however, caregiving needs are becoming out of scope for independent living. Shanon Brow is agreeable to patient going to facility for skilled vs long term needs, however, ideally he would like to have both his parents placed together in a double room. Discussed pending PT and OT evaluations. Shanon Brow stated that evaluations last week had recommended SNF, but they didn't realize how deconditioned patient really was and though home health with [facility] [place] would suffice. Ideal goal is for patient and spouse to be together wherever they are. TOC following for transition needs.    Expected Discharge Plan: Skilled Nursing Facility Barriers to Discharge: Continued Medical Work up   Patient Goals and CMS Choice Patient states their goals for this hospitalization and ongoing recovery are:: facility placement CMS Medicare.gov Compare Post Acute Care list provided to:: Patient Represenative (must comment) Rudell Rutherford, son, at [phone]) Choice offered to / list presented to : Adult Children  Expected Discharge Plan and Services Expected Discharge Plan: [place] In-house Referral: Clinical Social Work Discharge Planning Services: CM Consult Post Acute Care Choice: [place]  arrangements for the past 2 months: [place]                 DME Arranged: N/A DME Agency: NA       HH Arranged: NA [place] Agency: NA        Prior Living Arrangements/Services Living arrangements for the past 2 months: [place] Lives with:: Self, Spouse Patient language and need for interpreter reviewed:: Yes        Need for Family Participation in Patient Care: Yes (Comment) Care giver support system in place?: Yes (comment) Current home services: Home PT Criminal Activity/Legal Involvement Pertinent to Current Situation/Hospitalization: No - Comment as needed  Activities of Daily Living      Permission Sought/Granted Permission sought to share information with : Family Supports Permission granted to share information with : Yes, Verbal Permission Granted  Share Information with NAME: Jarratt Niess     Permission granted to share info w Relationship: son  Permission granted to share info w Contact Information: [phone]  Emotional Assessment Appearance:: Appears stated age Attitude/Demeanor/Rapport: Lethargic Affect (typically observed): Unable to Assess Orientation: : Oriented to Self Alcohol / Substance Use: Not Applicable Psych Involvement: No (comment)  Admission diagnosis:  Weakness [R53.1] Generalized weakness [R53.1] MRI contraindicated due to metal implant [Z53.09] Patient Active Problem List   Diagnosis Date Noted   Weakness 10/06/2020   Thrombocytosis 10/06/2020   Heart failure with preserved ejection fraction ([place]) 10/06/2020   Dementia without behavioral disturbance ([place]) 10/03/2020   Leukocytosis 10/03/2020   NSTEMI (non-ST elevated myocardial infarction) ([place]) 09/30/2020   Pressure injury of skin 09/30/2020   Pain due to onychomycosis of  toenails of both feet 07/13/2019   History of bradycardia 08/17/2018   Pleural effusion, left    Pleural effusion    Empyema, left ([place]) 05/04/2015   Empyema ([place])    CAP  (community acquired pneumonia) 04/29/2015   Sepsis ([place]) 04/29/2015   Dyspnea 04/29/2015   Acute kidney injury ([place]) 04/29/2015   Tobacco abuse 04/29/2015   Bradycardia 03/08/2015   Coronary atherosclerosis of native coronary artery 01/25/2013   Aortic valve disorder 01/25/2013   Mixed hyperlipidemia 01/25/2013   Essential hypertension, benign 01/25/2013   Memory difficulty 10/19/2012   PCP:  Lavone Orn, MD Pharmacy:   [facility] [place] - [address] [place] [address] Phone: [phone] Fax: [phone]     Social Determinants of Health (SDOH) Interventions    Readmission Risk Interventions No flowsheet data found.

## 2020-10-07 NOTE — Progress Notes (Signed)
Patient was zoned out not responding to sternal rub, pupils reactive.  VS WNL and documented.  MD made aware.  Will continue to monitor.

## 2020-10-07 NOTE — Progress Notes (Signed)
Physical Therapy Evaluation Patient Details Name: [name] MRN: [id number] DOB: 1926/06/10 Today's Date: 10/07/2020  History of Present Illness  87 year old admitted 9/16 with severe generalized weakness. Pt was hospitalized 9/11 >10/03/2020 after suffering a fall and subsequently being found to have suffered an NSTEMI with acute grade 2 diastolic CHF.  Following that hospital stay he was discharged back to his independent living facility, where it was questioned whether  he required a higher level of care. PMH:  HTN, CAD status post CABG 2010, AOS status post AVR, BPH, dementia  Clinical Impression  Pt admitted with above diagnosis. Pt unable to stand with +1 person assist. Pt needing min assist to sit. Nurse had given meds as pt was agitated earlier and question if this incr pt lethargy. Pt reports significant decline over last few weeks. Feel that a short term SNF stay would benefit pt as he was caregivver for wife PTA per chart. Will follow acuytely.  Pt currently with functional limitations due to the deficits listed below (see PT Problem List). Pt will benefit from skilled PT to increase their independence and safety with mobility to allow discharge to the venue listed below.          Recommendations for follow up therapy are one component of a multi-disciplinary discharge planning process, led by the attending physician.  Recommendations may be updated based on patient status, additional functional criteria and insurance authorization.  Follow Up Recommendations Supervision/Assistance - 24 hour;SNF    Equipment Recommendations  None recommended by PT    Recommendations for Other Services       Precautions / Restrictions Precautions Precautions: Fall Restrictions Weight Bearing Restrictions: No      Mobility  Bed Mobility Overal bed mobility: Needs Assistance Bed Mobility: Supine to Sit     Supine to sit: Mod assist;HOB elevated Sit to supine: Min assist   General  bed mobility comments: Increased time. Multimodal cueing. Assist for trunk.    Transfers     Transfers: Sit to/from Stand           General transfer comment: Decided not to stand as pt too drowsy and needing min assist to sit.Pt also needing to have BM and unsafe to get to 3N1 therefore placed pt on bedpan.  Ambulation/Gait                Stairs            Wheelchair Mobility    Modified Rankin (Stroke Patients Only)       Balance Overall balance assessment: Needs assistance;History of Falls Sitting-balance support: Feet supported;Single extremity supported Sitting balance-Leahy Scale: Poor Sitting balance - Comments: Needed assist for sitting                                     Pertinent Vitals/Pain Pain Assessment: Faces Faces Pain Scale: Hurts little more Pain Location: back Pain Descriptors / Indicators: Discomfort;Sore Pain Intervention(s): Limited activity within patient's tolerance;Monitored during session;Repositioned    Home Living Family/patient expects to be discharged to:: Private residence Living Arrangements: Spouse/significant other Available Help at Discharge: Family Type of Home: Independent living facility Home Access: Level entry     Home Layout: One level Home Equipment: Environmental consultant - 2 wheels;Bedside commode Additional Comments: unable to obtain all info as pt is confused. Information was from previous stay    Prior Function Level of Independence: Independent with assistive device(s)  Comments: uses RW most recently but prior to fall, he did not use a device. 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 per chart, son is trying to get pt and wife in ALF     Hand Dominance   Dominant Hand: Right    Extremity/Trunk Assessment   Upper Extremity Assessment Upper Extremity Assessment: Defer to OT evaluation    Lower Extremity Assessment Lower Extremity  Assessment: Generalized weakness    Cervical / Trunk Assessment Cervical / Trunk Assessment: Kyphotic  Communication   Communication: No difficulties  Cognition Arousal/Alertness: Awake/alert Behavior During Therapy: WFL for tasks assessed/performed Overall Cognitive Status: History of cognitive impairments - at baseline                                 General Comments: patient was noted to have some moments of memory loss during session      General Comments      Exercises     Assessment/Plan    PT Assessment Patient needs continued PT services  PT Problem List Decreased strength;Decreased mobility;Decreased activity tolerance;Decreased balance;Decreased cognition;Pain;Decreased knowledge of use of DME       PT Treatment Interventions DME instruction;Gait training;Therapeutic exercise;Balance training;Functional mobility training;Therapeutic activities;Patient/family education    PT Goals (Current goals can be found in the Care Plan section)  Acute Rehab PT Goals Patient Stated Goal: less pain. wants to go back home to help his wife PT Goal Formulation: With patient Time For Goal Achievement: 10/21/20 Potential to Achieve Goals: Good    Frequency Min 2X/week   Barriers to discharge Decreased caregiver support      Co-evaluation               AM-PAC PT "6 Clicks" Mobility  Outcome Measure Help needed turning from your back to your side while in a flat bed without using bedrails?: A Lot Help needed moving from lying on your back to sitting on the side of a flat bed without using bedrails?: A Lot Help needed moving to and from a bed to a chair (including a wheelchair)?: Total Help needed standing up from a chair using your arms (e.g., wheelchair or bedside chair)?: Total Help needed to walk in hospital room?: Total Help needed climbing 3-5 steps with a railing? : Total 6 Click Score: 8    End of Session Equipment Utilized During Treatment:  Gait belt Activity Tolerance: Patient limited by fatigue;Patient limited by lethargy Patient left: with call bell/phone within reach;in bed;with bed alarm set Nurse Communication: Mobility status PT Visit Diagnosis: History of falling (Z91.81);Muscle weakness (generalized) (M62.81);Difficulty in walking, not elsewhere classified (R26.2)    Time: [id number] PT Time Calculation (min) (ACUTE ONLY): 12 min   Charges:   PT Evaluation $PT Eval Low Complexity: 1 Low          Biridiana Twardowski M,PT Acute Rehab Services [phone] (pager)   Alvira Philips 10/07/2020, 4:17 PM

## 2020-10-08 DIAGNOSIS — Z951 Presence of aortocoronary bypass graft: Secondary | ICD-10-CM | POA: Diagnosis not present

## 2020-10-08 DIAGNOSIS — D631 Anemia in chronic kidney disease: Secondary | ICD-10-CM | POA: Diagnosis present

## 2020-10-08 DIAGNOSIS — N1832 Chronic kidney disease, stage 3b: Secondary | ICD-10-CM | POA: Diagnosis present

## 2020-10-08 DIAGNOSIS — I21A1 Myocardial infarction type 2: Secondary | ICD-10-CM | POA: Diagnosis present

## 2020-10-08 DIAGNOSIS — W19XXXA Unspecified fall, initial encounter: Secondary | ICD-10-CM | POA: Diagnosis present

## 2020-10-08 DIAGNOSIS — I130 Hypertensive heart and chronic kidney disease with heart failure and stage 1 through stage 4 chronic kidney disease, or unspecified chronic kidney disease: Secondary | ICD-10-CM | POA: Diagnosis present

## 2020-10-08 DIAGNOSIS — F0390 Unspecified dementia without behavioral disturbance: Secondary | ICD-10-CM | POA: Diagnosis present

## 2020-10-08 DIAGNOSIS — N400 Enlarged prostate without lower urinary tract symptoms: Secondary | ICD-10-CM | POA: Diagnosis present

## 2020-10-08 DIAGNOSIS — L89322 Pressure ulcer of left buttock, stage 2: Secondary | ICD-10-CM | POA: Diagnosis present

## 2020-10-08 DIAGNOSIS — S50311A Abrasion of right elbow, initial encounter: Secondary | ICD-10-CM | POA: Diagnosis present

## 2020-10-08 DIAGNOSIS — N179 Acute kidney failure, unspecified: Secondary | ICD-10-CM | POA: Diagnosis present

## 2020-10-08 DIAGNOSIS — I2510 Atherosclerotic heart disease of native coronary artery without angina pectoris: Secondary | ICD-10-CM | POA: Diagnosis present

## 2020-10-08 DIAGNOSIS — R296 Repeated falls: Secondary | ICD-10-CM | POA: Diagnosis present

## 2020-10-08 DIAGNOSIS — Z20822 Contact with and (suspected) exposure to covid-19: Secondary | ICD-10-CM | POA: Diagnosis present

## 2020-10-08 DIAGNOSIS — I5032 Chronic diastolic (congestive) heart failure: Secondary | ICD-10-CM | POA: Diagnosis present

## 2020-10-08 DIAGNOSIS — L89312 Pressure ulcer of right buttock, stage 2: Secondary | ICD-10-CM | POA: Diagnosis present

## 2020-10-08 DIAGNOSIS — E860 Dehydration: Secondary | ICD-10-CM | POA: Diagnosis present

## 2020-10-08 DIAGNOSIS — D72829 Elevated white blood cell count, unspecified: Secondary | ICD-10-CM | POA: Diagnosis present

## 2020-10-08 DIAGNOSIS — Z953 Presence of xenogenic heart valve: Secondary | ICD-10-CM | POA: Diagnosis not present

## 2020-10-08 DIAGNOSIS — Z66 Do not resuscitate: Secondary | ICD-10-CM | POA: Diagnosis present

## 2020-10-08 DIAGNOSIS — R531 Weakness: Secondary | ICD-10-CM | POA: Diagnosis present

## 2020-10-08 DIAGNOSIS — E785 Hyperlipidemia, unspecified: Secondary | ICD-10-CM | POA: Diagnosis present

## 2020-10-08 DIAGNOSIS — Y92009 Unspecified place in unspecified non-institutional (private) residence as the place of occurrence of the external cause: Secondary | ICD-10-CM | POA: Diagnosis not present

## 2020-10-08 DIAGNOSIS — Z681 Body mass index (BMI) 19 or less, adult: Secondary | ICD-10-CM | POA: Diagnosis not present

## 2020-10-08 DIAGNOSIS — F05 Delirium due to known physiological condition: Secondary | ICD-10-CM | POA: Diagnosis present

## 2020-10-08 DIAGNOSIS — E43 Unspecified severe protein-calorie malnutrition: Secondary | ICD-10-CM | POA: Diagnosis present

## 2020-10-08 LAB — CBC
HCT: 37.6 % — ABNORMAL LOW (ref 39.0–52.0)
Hemoglobin: 12.3 g/dL — ABNORMAL LOW (ref 13.0–17.0)
MCH: 29.4 pg (ref 26.0–34.0)
MCHC: 32.7 g/dL (ref 30.0–36.0)
MCV: 90 fL (ref 80.0–100.0)
Platelets: 1315 10*3/uL (ref 150–400)
RBC: 4.18 MIL/uL — ABNORMAL LOW (ref 4.22–5.81)
RDW: 18.6 % — ABNORMAL HIGH (ref 11.5–15.5)
WBC: 26.6 10*3/uL — ABNORMAL HIGH (ref 4.0–10.5)
nRBC: 0 % (ref 0.0–0.2)

## 2020-10-08 LAB — VITAMIN B12: Vitamin B-12: 1862 pg/mL — ABNORMAL HIGH (ref 180–914)

## 2020-10-08 LAB — BASIC METABOLIC PANEL
Anion gap: 8 (ref 5–15)
BUN: 32 mg/dL — ABNORMAL HIGH (ref 8–23)
CO2: 23 mmol/L (ref 22–32)
Calcium: 8.5 mg/dL — ABNORMAL LOW (ref 8.9–10.3)
Chloride: 109 mmol/L (ref 98–111)
Creatinine, Ser: 1.52 mg/dL — ABNORMAL HIGH (ref 0.61–1.24)
GFR, Estimated: 43 mL/min — ABNORMAL LOW (ref >60)
Glucose, Bld: 152 mg/dL — ABNORMAL HIGH (ref 70–99)
Potassium: 3.9 mmol/L (ref 3.5–5.1)
Sodium: 140 mmol/L (ref 135–145)

## 2020-10-08 LAB — FOLATE: Folate: 21.6 ng/mL (ref >5.9)

## 2020-10-08 LAB — AMMONIA: Ammonia: 23 umol/L (ref 9–35)

## 2020-10-08 NOTE — Evaluation (Signed)
Occupational Therapy Evaluation Patient Details Name: [name] MRN: [id number] DOB: [birth year] Today's Date: 10/08/2020   History of Present Illness 87 year old admitted 9/16 with severe generalized weakness. Pt was hospitalized 9/11 >10/03/2020 after suffering a fall and subsequently being found to have suffered an NSTEMI with acute grade 2 diastolic CHF.  Following that hospital stay he was discharged back to his independent living facility, where it was questioned whether  he required a higher level of care. PMH:  HTN, CAD status post CABG 2010, AOS status post AVR, BPH, dementia   Clinical Impression   PTA patient was living at an ILF with his wife and was grossly Mod I with ADLs/IADLs without use of AD prior to fall leading to last admission as stated above. Use of RW since previous admission but patient began having difficulty caring for himself prompting this admission. Patient currently functioning below baseline demonstrating observed ADLs including toileting/hygiene/clothing management with Min to Max A. +2 assist for safety with functional transfers and mobility with use of RW. Patient also limited by deficits listed below including generalized weakness, decreased balance and decreased activity tolerance and would benefit from continued acute OT services in prep for safe d/c to next level of care.       Recommendations for follow up therapy are one component of a multi-disciplinary discharge planning process, led by the attending physician.  Recommendations may be updated based on patient status, additional functional criteria and insurance authorization.   Follow Up Recommendations  SNF    Equipment Recommendations  None recommended by OT    Recommendations for Other Services       Precautions / Restrictions Precautions Precautions: Fall Restrictions Weight Bearing Restrictions: No      Mobility Bed Mobility Overal bed mobility: Needs Assistance Bed Mobility:  Supine to Sit     Supine to sit: Mod assist;HOB elevated     General bed mobility comments: Able to advance BLE toward EOB. Mod A to elevate trunk with HOB elevated and HHA.    Transfers Overall transfer level: Needs assistance Equipment used: Rolling walker (2 wheeled) Transfers: Sit to/from [facility] Sit to Stand: Min assist;Mod assist;+2 physical assistance Stand pivot transfers: Mod assist;+2 physical assistance       General transfer comment: Pt requiring modA + 2 to stand on initial trial from edge of bed, utilizing narrow BOS, cues for hand placement and anterior weight shift. Pivoting to [facility] with multimodal cues for sequencing/direction. Subsuquent stand from [facility] with minA + 2 with improved initiation    Balance Overall balance assessment: Needs assistance;History of Falls Sitting-balance support: Feet supported Sitting balance-Leahy Scale: Fair Sitting balance - Comments: Able to maintain static sitting balance at EOB with supervision A. Postural control: Posterior lean Standing balance support: No upper extremity supported;During functional activity Standing balance-Leahy Scale: Poor Standing balance comment: requiring minA for balance at sink with hand washing.                           ADL either performed or assessed with clinical judgement   ADL Overall ADL's : Needs assistance/impaired Eating/Feeding: Set up;Sitting   Grooming: Wash/dry hands;Standing;Minimal assistance Grooming Details (indicate cue type and reason): Min A for balance standing at sink level. Upper Body Bathing: Minimal assistance;Sitting   Lower Body Bathing: Sitting/lateral leans;Moderate assistance   Upper Body Dressing : Sitting;Minimal assistance   Lower Body Dressing: Moderate assistance;Sitting/lateral leans;Cueing for safety   Toilet Transfer: Minimal assistance;+2  for safety/equipment;RW Toilet Transfer Details (indicate cue type and reason): Min A +2  for safety/line management. Cues for hand placement and external assist for walker management.   Toileting - Clothing Manipulation Details (indicate cue type and reason): Reports need for BM but unable.             Vision Baseline Vision/History: 1 Wears glasses (At all times) Patient Visual Report: No change from baseline Vision Assessment?: No apparent visual deficits     Perception     Praxis      Pertinent Vitals/Pain Pain Assessment: Faces Faces Pain Scale: Hurts little more Pain Location: back Pain Descriptors / Indicators: Discomfort;Sore Pain Intervention(s): Limited activity within patient's tolerance;Monitored during session;Repositioned     Hand Dominance Right   Extremity/Trunk Assessment Upper Extremity Assessment Upper Extremity Assessment: Generalized weakness   Lower Extremity Assessment Lower Extremity Assessment: Generalized weakness   Cervical / Trunk Assessment Cervical / Trunk Assessment: Kyphotic   Communication Communication Communication: No difficulties   Cognition Arousal/Alertness: Awake/alert Behavior During Therapy: WFL for tasks assessed/performed Overall Cognitive Status: History of cognitive impairments - at baseline                                 General Comments: Decreased STM   General Comments  Reports mild dizziness upon standing from [facility] but states that it quickly resolved.    Exercises     Shoulder Instructions      Home Living Family/patient expects to be discharged to:: Private residence Living Arrangements: Spouse/significant other Available Help at Discharge: Family Type of Home: Independent living facility Home Access: Level entry     Home Layout: One level     Bathroom Shower/Tub: Walk-in shower         Home Equipment: Environmental consultant - 2 wheels;Bedside commode   Additional Comments: unable to obtain all info as pt is confused. Information was from previous stay      Prior  Functioning/Environment Level of Independence: Independent with assistive device(s)        Comments: uses RW most recently but prior to fall, he did not use a device. 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 per chart, son is trying to get pt and wife in ALF        OT Problem List: Decreased strength;Decreased activity tolerance;Impaired balance (sitting and/or standing);Decreased safety awareness;Decreased knowledge of use of DME or AE      OT Treatment/Interventions: Self-care/ADL training;Therapeutic exercise;Energy conservation;DME and/or AE instruction;Therapeutic activities;Balance training;Patient/family education    OT Goals(Current goals can be found in the care plan section) Acute Rehab OT Goals Patient Stated Goal: did not state OT Goal Formulation: With patient Time For Goal Achievement: 10/22/20 Potential to Achieve Goals: Good ADL Goals Pt Will Perform Grooming: standing;with supervision Pt Will Perform Upper Body Dressing: with supervision;sitting Pt Will Perform Lower Body Dressing: sit to/from stand;with min guard assist Pt Will Transfer to Toilet: with supervision;bedside commode Pt Will Perform Toileting - Clothing Manipulation and hygiene: sit to/from stand;with min guard assist Additional ADL Goal #1: Patient will tolerate static standing balance for 2-3 min with supervision A and use of LRAD in prep for grooming tasks standing at sink level.  OT Frequency: Min 2X/week   Barriers to D/C: Decreased caregiver support  Lives with wife at [facility]. Reports being caregiver for his wife.       Co-evaluation  PT/OT/SLP Co-Evaluation/Treatment: Yes Reason for Co-Treatment: Complexity of the patient's impairments (multi-system involvement)   OT goals addressed during session: ADL's and self-care      AM-PAC OT "6 Clicks" Daily Activity     Outcome Measure Help from another person eating meals?: A Little Help from  another person taking care of personal grooming?: A Little Help from another person toileting, which includes using toliet, bedpan, or urinal?: A Lot Help from another person bathing (including washing, rinsing, drying)?: A Lot Help from another person to put on and taking off regular upper body clothing?: A Little Help from another person to put on and taking off regular lower body clothing?: A Lot 6 Click Score: 15   End of Session Equipment Utilized During Treatment: Gait belt;Rolling walker Nurse Communication: Mobility status  Activity Tolerance: Patient tolerated treatment well Patient left: in chair;with call bell/phone within reach;with chair alarm set  OT Visit Diagnosis: Unsteadiness on feet (R26.81);Muscle weakness (generalized) (M62.81);History of falling (Z91.81)                Time: [id number] OT Time Calculation (min): 30 min Charges:  OT General Charges $OT Visit: 1 Visit OT Evaluation $OT Eval Moderate Complexity: 1 Mod  Lionel Woodberry H. OTR/L Supplemental OT, Department of rehab services [phone]  Ngoc Daughtridge R H. 10/08/2020, 10:00 AM

## 2020-10-08 NOTE — NC FL2 (Signed)
[place] LEVEL OF CARE SCREENING TOOL     IDENTIFICATION  Patient Name: [name] Birthdate: 05-Oct-1926 Sex: male Admission Date (Current Location): 10/05/2020  [facility] and [place] Number:  Herbalist and Address:  [facility] [place] [address] [place], [place] [address]      Provider Number: [id number]  Attending Physician Name and Address:  Antonieta Pert, MD  Relative Name and Phone Number:  Nabor Neeson - [phone]    Current Level of Care: Hospital Recommended Level of Care: [place] Prior Approval Number:    Date Approved/Denied:   PASRR Number: [id number]  Discharge Plan: SNF    Current Diagnoses: Patient Active Problem List   Diagnosis Date Noted   Weakness 10/06/2020   Thrombocytosis 10/06/2020   Heart failure with preserved ejection fraction ([place]) 10/06/2020   Dementia without behavioral disturbance ([place]) 10/03/2020   Leukocytosis 10/03/2020   NSTEMI (non-ST elevated myocardial infarction) ([place]) 09/30/2020   Pressure injury of skin 09/30/2020   Pain due to onychomycosis of toenails of both feet 07/13/2019   History of bradycardia 08/17/2018   Pleural effusion, left    Pleural effusion    Empyema, left ([place]) 05/04/2015   Empyema ([place])    CAP (community acquired pneumonia) 04/29/2015   Sepsis ([place]) 04/29/2015   Dyspnea 04/29/2015   Acute kidney injury ([place]) 04/29/2015   Tobacco abuse 04/29/2015   Bradycardia 03/08/2015   Coronary atherosclerosis of native coronary artery 01/25/2013   Aortic valve disorder 01/25/2013   Mixed hyperlipidemia 01/25/2013   Essential hypertension, benign 01/25/2013   Memory difficulty 10/19/2012    Orientation RESPIRATION BLADDER Height & Weight     Self, Place  Normal Incontinent (External catheter placed 9/17) Weight: 134 lb 4.2 oz (60.9 kg) Height:  6' (182.9 cm)  BEHAVIORAL SYMPTOMS/MOOD NEUROLOGICAL BOWEL NUTRITION STATUS       Incontinent Diet (DYS 1 dier)  AMBULATORY STATUS COMMUNICATION OF NEEDS Skin   Extensive Assist (Mod assist - +2 physical assist per PT) Verbally Other (Comment) (Pressure injury-stage 2 left buttocks; Pressure injury-stage 2 right buttocks; MASD right scrotum, excoriated, yellow fluid filled blisters)                       Personal Care Assistance Level of Assistance  Bathing, Feeding, Dressing Bathing Assistance: Maximum assistance ([place] body min assist) Feeding assistance: Limited assistance (Assistance with set-up) Dressing Assistance: Maximum assistance (Upper body min assist)     Functional Limitations Info  Sight, Hearing, Speech Sight Info: Impaired (Wears glasses) Hearing Info: Impaired Speech Info: Adequate    SPECIAL CARE FACTORS FREQUENCY  PT (By licensed PT), OT (By licensed OT)     PT Frequency: PT evaluation 9/18. PT at SNF eval and treat, a minimum of 5 days per week OT Frequency: OTevaluation 9/18. OT at SNF eval and treat, a minimum of 5 days per week            Contractures Contractures Info: Not present    Additional Factors Info  Code Status, Allergies, Insulin Sliding Scale Code Status Info: DNR Allergies Info: No known allergies           Current Medications (10/08/2020):  This is the current hospital active medication list Current Facility-Administered Medications  Medication Dose Route Frequency Provider Last Rate Last Admin   0.9 %  sodium chloride infusion   Intravenous Continuous Cherene Altes, MD 75 mL/hr at 10/08/20 [phone] New  Bag at 10/08/20 [id number]   acetaminophen (TYLENOL) tablet 650 mg  650 mg Oral Q6H PRN Fuller Plan A, MD       albuterol (PROVENTIL) (2.5 MG/3ML) 0.083% nebulizer solution 2.5 mg  2.5 mg Nebulization Q6H PRN Tamala Julian, Rondell A, MD       amLODipine (NORVASC) tablet 10 mg  10 mg Oral Daily Tamala Julian, Rondell A, MD   10 mg at 10/08/20 1152   aspirin EC tablet 81 mg  81 mg Oral Daily Smith, Rondell A, MD   81 mg at  10/08/20 1152   atorvastatin (LIPITOR) tablet 20 mg  20 mg Oral Daily Smith, Rondell A, MD   20 mg at 10/08/20 1152   enoxaparin (LOVENOX) injection 30 mg  30 mg Subcutaneous Q24H Smith, Rondell A, MD   30 mg at 10/07/20 1717   latanoprost (XALATAN) 0.005 % ophthalmic solution 1 drop  1 drop Both Eyes QHS Smith, Rondell A, MD   1 drop at 10/07/20 2248   sodium chloride flush (NS) 0.9 % injection 3 mL  3 mL Intravenous Q12H Smith, Rondell A, MD   3 mL at 10/07/20 1715     Discharge Medications: Please see discharge summary for a list of discharge medications.  Relevant Imaging Results:  Relevant Lab Results:   Additional Information ss#[id number].  Sable Feil, LCSW

## 2020-10-08 NOTE — Progress Notes (Signed)
PROGRESS NOTE    [name]  YBW:[id number] DOB: [birth year] DOA: 10/05/2020 PCP: Lavone Orn, MD   Chief Complaint  Patient presents with   Weakness   Brief Narrative/Hospital Course: 87 year old male with HTN, CAD, CABG 2010, AVS with AVR, BPH, dementia readmitted with severe generalized weakness.  Initially hospitalized 9/11-09/2012 after suffering a fall and subsequently being found to have suffered non-ST elevation MI with acute grade 2 diastolic CHF following hospital course was discharged back to independent living facility where it was opined that he required a higher level of care.  EMS was called because of his blood pressure was found to be 170/92 in the ED he was found to have blood pressure 186/80 BUN 28 creatinine 1.6 CT head and cervical spine no acute finding chest x-ray right-sided pleural effusion noted x-ray of the thoracic inlet lumbar spine no acute finding EKG to be in person 2 3 and aVF but no ST elevation.  Patient is admitted for deconditioning so generalized weakness, acute kidney injury. He was hydrated with IV fluids.  Reevaluated by cardiology at time of readmission and did not recommend any further intervention. Is more alert awake but still with PT OT and need a skilled nursing facility Subjective: Seen this morning he was working with PT was not agitated following commands  Assessment & Plan:  Generalized weakness/deconditioning with recurrent falls: Imaging no acute finding MRI brain and cervical spine no acute abnormalities or fracture.  Based on previous records  SNF was suggested during prior hospital day but family declined and patient was subsequently discharged to [facility] facility where he did not thrive PT OT evaluated here and planning for skilled nursing facility.  AKI on CKDIIIb: baseline creat ~ 1.6. AKI from dehydration poor intake.  Increase oral hydration.  Has been on IV fluids-weight at 68.9 kg previously 66.9.  Appears to be improved stop  IV fluids Recent Labs    01/23/20 1226 09/10/20 1026 09/29/20 1740 09/30/20 1141 10/01/20 0422 10/02/20 0403 10/03/20 0420 10/05/20 1347 10/07/20 0510 10/08/20 0124  BUN 32 24* 34* 38* 38* 36* 33* 28* 26* 32*  CREATININE 1.58* 1.45* 1.63* 1.78* 1.78* 1.68* 1.49* 1.66* 1.45* 1.52*   Filed Weights   10/05/20 1226 10/06/20 2010 10/07/20 2138  Weight: 66.9 kg 60.7 kg 60.9 kg      Memory difficulty: Mild dementia versus acute delirium, currently stable.  Supportive care fall precaution delirium precaution.  MRI brain moderate to severe global parenchymal volume loss suggestive of age related dementia.  Recent NSTEMI CAD/CABG  Essential hypertension, benign HLD: Seen by cardiology this admission did not recommend any further intervention.  Continue his home regimen amlodipine and aspirin and statin.  Monitor blood pressure.  No chest pain.   Chronic grade 2 diastolic CHF normal EF euvolemic on exam  Leukocytosis Thrombocytosis: seems chronic issues with CBC WBC being up to 40 3.5 during last hospitalization outpatient follow-up with hematology has been recommended no evidence of infection, myeloproliferative disorder is in the differential and will need follow-up.  Continue aspirin Recent Labs  Lab 10/05/20 1347 10/07/20 0510 10/08/20 0124  HGB 12.8* 13.6 12.3*  HCT 39.2 40.8 37.6*  WBC 31.5* 35.0* 26.6*  PLT 1,004* 1,319* 1,315*   Body mass index is 18.21 kg/m.  Moderate protein calorie malnutrition, consult dietitian augment diet.  FEN: Diet Order             DIET - DYS 1 Room service appropriate? Yes; Fluid consistency: Thin  Diet effective now  DVT prophylaxis: enoxaparin (LOVENOX) injection 30 mg Start: 10/06/20 1800 Code Status:   Code Status: DNR  Family Communication: plan of care discussed with patient at bedside.  Status is: Inpatient  Remains inpatient appropriate because:Inpatient level of care appropriate due to severity of  illness  Dispo: The patient is from:  ILF              Anticipated d/c is to: SNF              Patient currently is not medically stable to d/c.   Difficult to place patient No        Objective: Vitals: Today's Vitals   10/07/20 1635 10/07/20 2138 10/08/20 0643 10/08/20 0942  BP: (!) 157/64 (!) 141/54 (!) 181/64 (!) 155/59  Pulse: 65 67 62 67  Resp: 18 18 17 16   Temp: 98 F (36.7 C) 98.3 F (36.8 C)  97.6 F (36.4 C)  TempSrc:  Oral  Oral  SpO2: 100% 99% 99% 100%  Weight:  60.9 kg    Height:      PainSc:  0-No pain     Examination: General exam: AA to place self, elderly frail weak & older than stated age HEENT:Oral mucosa moist, Ear/Nose WNL grossly,dentition normal. Respiratory system: bilaterally diminished, no use of accessory muscle, non tender. Cardiovascular system: S1 & S2 +,No JVD. Gastrointestinal system: Abdomen soft, NT,ND, BS+. Nervous System:Alert, awake, moving extremities Extremities: mild edema, distal peripheral pulses palpable.  Skin: No rashes,no icterus. MSK: Normal muscle bulk,tone, power   Intake/Output Summary (Last 24 hours) at 10/08/2020 1324 Last data filed at 10/08/2020 0600 Gross per 24 hour  Intake 955.45 ml  Output 250 ml  Net 705.45 ml   Filed Weights   10/05/20 1226 10/06/20 2010 10/07/20 2138  Weight: 66.9 kg 60.7 kg 60.9 kg   Weight change: 0.2 kg   Consultants:see note  Procedures:see note Antimicrobials: Anti-infectives (From admission, onward)    None      Culture/Microbiology    Component Value Date/Time   SDES IN/OUT CATH URINE 10/06/2020 0105   SPECREQUEST NONE 10/06/2020 0105   CULT  10/06/2020 0105    NO GROWTH Performed at [facility], [place] [address], [place],  [address]    REPTSTATUS 10/07/2020 FINAL 10/06/2020 0105    Other culture-see note  Unresulted Labs (From admission, onward)     Start     Ordered   10/07/20 0510  Pathologist smear review  Once,   R        10/07/20 0510            Medications reviewed:  Scheduled Meds:  amLODipine  10 mg Oral Daily   aspirin EC  81 mg Oral Daily   atorvastatin  20 mg Oral Daily   enoxaparin (LOVENOX) injection  30 mg Subcutaneous Q24H   latanoprost  1 drop Both Eyes QHS   sodium chloride flush  3 mL Intravenous Q12H   Continuous Infusions:  sodium chloride 75 mL/hr at 10/08/20 0638     Intake/Output from previous day: 09/18 0701 - 09/19 0700 In: 1155.5 [P.O.:200; I.V.:955.5] Out: 250 [Urine:250] Intake/Output this shift: No intake/output data recorded. Filed Weights   10/05/20 1226 10/06/20 2010 10/07/20 2138  Weight: 66.9 kg 60.7 kg 60.9 kg   Data Reviewed: I have personally reviewed following labs and imaging studies CBC: Recent Labs  Lab 10/02/20 0403 10/03/20 0420 10/05/20 1347 10/07/20 0510 10/08/20 0124  WBC 24.0* 26.9* 31.5* 35.0* 26.6*  NEUTROABS  --   --  27.4*  --   --   HGB 10.7* 11.8* 12.8* 13.6 12.3*  HCT 32.4* 35.8* 39.2 40.8 37.6*  MCV 89.3 89.5 91.8 88.9 90.0  PLT 709* 883* 1,004* 1,319* 9,767*   Basic Metabolic Panel: Recent Labs  Lab 10/02/20 0403 10/03/20 0420 10/05/20 1347 10/07/20 0510 10/08/20 0124  NA 139 142 142 140 140  K 3.5 3.8 4.6 3.8 3.9  CL 107 108 110 106 109  CO2 24 24 23 23 23   GLUCOSE 102* 106* 105* 107* 152*  BUN 36* 33* 28* 26* 32*  CREATININE 1.68* 1.49* 1.66* 1.45* 1.52*  CALCIUM 8.5* 8.5* 8.7* 8.8* 8.5*  MG  --   --  2.5*  --   --    GFR: Estimated Creatinine Clearance: 27.3 mL/min (A) (by C-G formula based on SCr of 1.52 mg/dL (H)). Liver Function Tests: No results for input(s): AST, ALT, ALKPHOS, BILITOT, PROT, ALBUMIN in the last 168 hours. No results for input(s): LIPASE, AMYLASE in the last 168 hours. Recent Labs  Lab 10/08/20 0124  AMMONIA 23   Coagulation Profile: No results for input(s): INR, PROTIME in the last 168 hours. Cardiac Enzymes: Recent Labs  Lab 10/07/20 0510  CKTOTAL 49   BNP (last 3 results) No results for  input(s): PROBNP in the last 8760 hours. HbA1C: No results for input(s): HGBA1C in the last 72 hours. CBG: No results for input(s): GLUCAP in the last 168 hours. Lipid Profile: No results for input(s): CHOL, HDL, LDLCALC, TRIG, CHOLHDL, LDLDIRECT in the last 72 hours. Thyroid Function Tests: Recent Labs    10/05/20 1347  TSH 0.823   Anemia Panel: Recent Labs    10/08/20 0124  VITAMINB12 1,862*  FOLATE 21.6   Sepsis Labs: Recent Labs  Lab 10/02/20 0403  PROCALCITON 0.13    Recent Results (from the past 240 hour(s))  Resp Panel by RT-PCR (Flu A&B, Covid) Nasopharyngeal Swab     Status: None   Collection Time: 09/30/20 11:41 AM   Specimen: Nasopharyngeal Swab; Nasopharyngeal(NP) swabs in vial transport medium  Result Value Ref Range Status   SARS Coronavirus 2 by RT PCR NEGATIVE NEGATIVE Final    Comment: (NOTE) SARS-CoV-2 target nucleic acids are NOT DETECTED.  The SARS-CoV-2 RNA is generally detectable in upper respiratory specimens during the acute phase of infection. The lowest concentration of SARS-CoV-2 viral copies this assay can detect is 138 copies/mL. A negative result does not preclude SARS-Cov-2 infection and should not be used as the sole basis for treatment or other patient management decisions. A negative result may occur with  improper specimen collection/handling, submission of specimen other than nasopharyngeal swab, presence of viral mutation(s) within the areas targeted by this assay, and inadequate number of viral copies(<138 copies/mL). A negative result must be combined with clinical observations, patient history, and epidemiological information. The expected result is Negative.  Fact Sheet for Patients:  EntrepreneurPulse.com.au  Fact Sheet for Healthcare Providers:  IncredibleEmployment.be  This test is no t yet approved or cleared by the [place] FDA and  has been authorized for detection and/or  diagnosis of SARS-CoV-2 by FDA under an Emergency Use Authorization (EUA). This EUA will remain  in effect (meaning this test can be used) for the duration of the COVID-19 declaration under Section 564(b)(1) of the Act, 21 U.S.C.section 360bbb-3(b)(1), unless the authorization is terminated  or revoked sooner.       Influenza A by PCR NEGATIVE NEGATIVE Final   Influenza B by PCR NEGATIVE NEGATIVE Final  Comment: (NOTE) The Xpert Xpress SARS-CoV-2/FLU/RSV plus assay is intended as an aid in the diagnosis of influenza from Nasopharyngeal swab specimens and should not be used as a sole basis for treatment. Nasal washings and aspirates are unacceptable for Xpert Xpress SARS-CoV-2/FLU/RSV testing.  Fact Sheet for Patients: EntrepreneurPulse.com.au  Fact Sheet for Healthcare Providers: IncredibleEmployment.be  This test is not yet approved or cleared by the [place] FDA and has been authorized for detection and/or diagnosis of SARS-CoV-2 by FDA under an Emergency Use Authorization (EUA). This EUA will remain in effect (meaning this test can be used) for the duration of the COVID-19 declaration under Section 564(b)(1) of the Act, 21 U.S.C. section 360bbb-3(b)(1), unless the authorization is terminated or revoked.  Performed at [facility], [place] [address] [place], [place] [address]   Urine Culture     Status: None   Collection Time: 09/30/20 11:41 AM   Specimen: Urine, Clean Catch  Result Value Ref Range Status   Specimen Description   Final    URINE, CLEAN CATCH Performed at [facility], [place] [address] [place], [place] [address]    Special Requests   Final    NONE Performed at [facility], [place] [address], [place], [place] [address]    Culture   Final    NO GROWTH Performed at [facility], [place] [address] [place], [place] [address]    Report Status 10/01/2020 FINAL   Final  Culture, blood (routine x 2)     Status: None   Collection Time: 09/30/20  6:33 PM   Specimen: BLOOD  Result Value Ref Range Status   Specimen Description   Final    BLOOD LEFT ANTECUBITAL Performed at [facility] [address] [place], [place] [address]    Special Requests   Final    BOTTLES DRAWN AEROBIC AND ANAEROBIC Blood Culture adequate volume Performed at [facility] [address] [place], [place] [address]    Culture   Final    NO GROWTH 5 DAYS Performed at [facility], [place] [address] [place], [place] [address]    Report Status 10/05/2020 FINAL  Final  Culture, blood (routine x 2)     Status: None   Collection Time: 09/30/20  6:33 PM   Specimen: BLOOD  Result Value Ref Range Status   Specimen Description   Final    BLOOD BLOOD LEFT HAND Performed at [facility] [address], [place], [place] [address]    Special Requests   Final    BOTTLES DRAWN AEROBIC AND ANAEROBIC Blood Culture adequate volume Performed at [facility] [address], [place],  [address]    Culture   Final    NO GROWTH 5 DAYS Performed at [facility], [place] [address], [place],  [address]    Report Status 10/05/2020 FINAL  Final  Resp Panel by RT-PCR (Flu A&B, Covid) Nasopharyngeal Swab     Status: None   Collection Time: 10/05/20  1:47 PM   Specimen: Nasopharyngeal Swab; Nasopharyngeal(NP) swabs in vial transport medium  Result Value Ref Range Status   SARS Coronavirus 2 by RT PCR NEGATIVE NEGATIVE Final    Comment: (NOTE) SARS-CoV-2 target nucleic acids are NOT DETECTED.  The SARS-CoV-2 RNA is generally detectable in upper respiratory specimens during the acute phase of infection. The lowest concentration of SARS-CoV-2 viral copies this assay can detect is 138 copies/mL.  A negative result does not preclude SARS-Cov-2 infection and should not be used as the sole basis  for treatment or other patient management decisions. A negative result may occur with  improper specimen collection/handling, submission of specimen other than nasopharyngeal swab, presence of viral mutation(s) within the areas targeted by this assay, and inadequate number of viral copies(<138 copies/mL). A negative result must be combined with clinical observations, patient history, and epidemiological information. The expected result is Negative.  Fact Sheet for Patients:  EntrepreneurPulse.com.au  Fact Sheet for Healthcare Providers:  IncredibleEmployment.be  This test is no t yet approved or cleared by the [place] FDA and  has been authorized for detection and/or diagnosis of SARS-CoV-2 by FDA under an Emergency Use Authorization (EUA). This EUA will remain  in effect (meaning this test can be used) for the duration of the COVID-19 declaration under Section 564(b)(1) of the Act, 21 U.S.C.section 360bbb-3(b)(1), unless the authorization is terminated  or revoked sooner.       Influenza A by PCR NEGATIVE NEGATIVE Final   Influenza B by PCR NEGATIVE NEGATIVE Final    Comment: (NOTE) The Xpert Xpress SARS-CoV-2/FLU/RSV plus assay is intended as an aid in the diagnosis of influenza from Nasopharyngeal swab specimens and should not be used as a sole basis for treatment. Nasal washings and aspirates are unacceptable for Xpert Xpress SARS-CoV-2/FLU/RSV testing.  Fact Sheet for Patients: EntrepreneurPulse.com.au  Fact Sheet for Healthcare Providers: IncredibleEmployment.be  This test is not yet approved or cleared by the [place] FDA and has been authorized for detection and/or diagnosis of SARS-CoV-2 by FDA under an Emergency Use Authorization (EUA). This EUA will remain in effect (meaning this test can be used) for the duration of the COVID-19 declaration under Section 564(b)(1) of the Act, 21  U.S.C. section 360bbb-3(b)(1), unless the authorization is terminated or revoked.  Performed at [facility], [place] [address] [place], [place] [address]   Urine Culture     Status: None   Collection Time: 10/06/20  1:05 AM   Specimen: In/Out Cath Urine  Result Value Ref Range Status   Specimen Description IN/OUT CATH URINE  Final   Special Requests NONE  Final   Culture   Final    NO GROWTH Performed at [facility], [place] [address], [place], [place] [address]    Report Status 10/07/2020 FINAL  Final     Radiology Studies: No results found.   LOS: 0 days   Antonieta Pert, MD [facility]  10/08/2020, 1:24 PM

## 2020-10-08 NOTE — TOC Progression Note (Addendum)
Transition of Care [facility]) - Progression Note    Patient Details  Name: [name] MRN: [id number] Date of Birth: 1926-10-27  Transition of Care [facility]) CM/SW Contact  Sharlet Salina Mila Homer, LCSW Phone Number: 10/08/2020, 1:38 PM  Clinical Narrative:  Talked with patient's son [name] [phone]) regarding SNF placement for his dad for short-term rehab. [name] also informed CSW that he wants this mother to go the same facility as they need to be together and in the same room if possible, and this was discussed. Son was advised that his mother would be private pay as she does not need rehab and [name] was aware of this. He was also informed that they may not be able to be in the same room and this was discussed. Son still desires for them to be in the same facility if possible as they as used to being together and could visit with each other. [name] was advised that he will be informed of the facility responses and more discussion can be had regarding his mother being placed in the same facility with his father.   4:58 pm: Talked with son regarding facility responses. He was provided with the SNF's that made bed offers and those that declined. This infor was also emailed to him (e-mail did not contain patient's name). [name] reported that he is in a nursing facility in [place], [place] (formerly a [facility] facility) and plans to talk with the admissions person when she returns from vacation regarding his parents possibility coming to this facility.  When asked, son responded that [name], [place] is past [name].    Expected Discharge Plan: Skilled Nursing Facility Barriers to Discharge: Continued Medical Work up  Expected Discharge Plan and Services Expected Discharge Plan: [place] In-house Referral: Clinical Social Work Discharge Planning Services: CM Consult Post Acute Care Choice: [place] arrangements for the past 2 months:  [place]                 DME Arranged: N/A DME Agency: NA       HH Arranged: NA HH Agency: NA         Social Determinants of Health (SDOH) Interventions  Son wants his mom and dad to be in the same facility  Readmission Risk Interventions No flowsheet data found.

## 2020-10-08 NOTE — Progress Notes (Signed)
Physical Therapy Treatment Patient Details Name: [name] MRN: [id number] DOB: 13-Feb-1926 Today's Date: 10/08/2020   History of Present Illness 87 year old admitted 9/16 with severe generalized weakness. Pt was hospitalized 9/11 >10/03/2020 after suffering a fall and subsequently being found to have suffered an NSTEMI with acute grade 2 diastolic CHF.  Following that hospital stay he was discharged back to his independent living facility, where it was questioned whether  he required a higher level of care. PMH:  HTN, CAD status post CABG 2010, AOS status post AVR, BPH, dementia    PT Comments    Pt with improved alertness and participation in therapy session today. Not oriented to time, however, follows majority of one step commands. Pt requiring two person min-mod assist for functional mobility. Ambulating limited distances to bedside commode and then sink with a walker, demonstrating posterior bias and narrow BOS. Pt presents as a high fall risk based on decreased gait speed, balance deficits, and decreased safety awareness. Recommend SNF at discharge to address and maximize functional mobility.     Recommendations for follow up therapy are one component of a multi-disciplinary discharge planning process, led by the attending physician.  Recommendations may be updated based on patient status, additional functional criteria and insurance authorization.  Follow Up Recommendations  Supervision/Assistance - 24 hour;SNF     Equipment Recommendations  None recommended by PT    Recommendations for Other Services       Precautions / Restrictions Precautions Precautions: Fall Restrictions Weight Bearing Restrictions: No     Mobility  Bed Mobility               General bed mobility comments: Sitting EOB with OT upon entrance    Transfers Overall transfer level: Needs assistance Equipment used: Rolling walker (2 wheeled) Transfers: Sit to/from [employer] Sit to Stand: Min assist;Mod assist;+2 physical assistance Stand pivot transfers: Mod assist;+2 physical assistance       General transfer comment: Pt requiring modA + 2 to stand on initial trial from edge of bed, utilizing narrow BOS, cues for hand placement and anterior weight shift. Pivoting to [facility] with multimodal cues for sequencing/direction. Subsuquent stand from [facility] with minA + 2 with improved initiation  Ambulation/Gait Ambulation/Gait assistance: Mod assist;+2 physical assistance Gait Distance (Feet): 8 Feet (3 ft to BSC, 5 ft to sink) Assistive device: Rolling walker (2 wheeled) Gait Pattern/deviations: Step-through pattern;Decreased stride length;Leaning posteriorly;Decreased dorsiflexion - right;Decreased dorsiflexion - left;Narrow base of support Gait velocity: decreased Gait velocity interpretation: <1.8 ft/sec, indicate of risk for recurrent falls General Gait Details: Pt utilizing narrow BOS with posterior bias, multimodal cues for anterior weight shift and glute activation. ModA + 2 overall for balance and truncal control in addition to walker management   Stairs             Wheelchair Mobility    Modified Rankin (Stroke Patients Only)       Balance Overall balance assessment: Needs assistance;History of Falls Sitting-balance support: Feet supported Sitting balance-Leahy Scale: Fair     Standing balance support: No upper extremity supported;During functional activity Standing balance-Leahy Scale: Poor Standing balance comment: requiring minA for balance at sink with hygiene task                            Cognition Arousal/Alertness: Awake/alert Behavior During Therapy: WFL for tasks assessed/performed Overall Cognitive Status: History of cognitive impairments - at baseline  General Comments: Pt able to state name and birthday; not oriented to date      Exercises      General  Comments        Pertinent Vitals/Pain Pain Assessment: Faces Faces Pain Scale: No hurt    Home Living                      Prior Function            PT Goals (current goals can now be found in the care plan section) Acute Rehab PT Goals Patient Stated Goal: did not state PT Goal Formulation: With patient Time For Goal Achievement: 10/21/20 Potential to Achieve Goals: Good Progress towards PT goals: Progressing toward goals    Frequency    Min 2X/week      PT Plan Current plan remains appropriate    Co-evaluation              AM-PAC PT "6 Clicks" Mobility   Outcome Measure  Help needed turning from your back to your side while in a flat bed without using bedrails?: A Little Help needed moving from lying on your back to sitting on the side of a flat bed without using bedrails?: A Little Help needed moving to and from a bed to a chair (including a wheelchair)?: A Lot Help needed standing up from a chair using your arms (e.g., wheelchair or bedside chair)?: A Lot Help needed to walk in hospital room?: A Lot Help needed climbing 3-5 steps with a railing? : Total 6 Click Score: 13    End of Session Equipment Utilized During Treatment: Gait belt Activity Tolerance: Patient tolerated treatment well Patient left: in chair;with chair alarm set;with call bell/phone within reach Nurse Communication: Mobility status PT Visit Diagnosis: History of falling (Z91.81);Muscle weakness (generalized) (M62.81);Difficulty in walking, not elsewhere classified (R26.2)     Time: [id number] PT Time Calculation (min) (ACUTE ONLY): 21 min  Charges:  $Therapeutic Activity: 8-22 mins                    Wyona Almas, PT, DPT Acute Rehabilitation Services Pager [phone] Office [phone]     Deno Etienne 10/08/2020, 9:28 AM

## 2020-10-09 LAB — PATHOLOGIST SMEAR REVIEW

## 2020-10-09 NOTE — TOC Progression Note (Addendum)
Transition of Care [facility]) - Progression Note    Patient Details  Name: [name] MRN: [id number] Date of Birth: 08/14/1926  Transition of Care [facility]) CM/SW Contact  Sharlet Salina Mila Homer, LCSW Phone Number: 10/09/2020, 4:07 PM  Clinical Narrative:  Talked with son,David [phone]) regarding his dad's discharge disposition and he spoke with the admissions director at the facility where he is resident - [facility], and she is willing to consider patient. He is also interested in [facility] and [facility]. Son was informed that patient's clinicals had been faxed to both of these facilities, along with the other SNF's in [facility].  Informed son that the clinicals were sent to [facility] as they were owned by the same company.  1:56 pm: Talked with Ebony Hail, admissions liaison and was informed that [facility] offered on patient. 3:30 pm: [facility] facility and spoke with admissions director Caryl Pina and was provided with her fax number: [phone]. Clinicals faxed to facility. 4:43 pm: Talked with son Shanon Brow and he wants his dad to discharge to [place] facility for [place] rehab. 5:03 pm: Jeri Modena, admissions director and left message regarding son wanting [facility] for [facility] rehab and that [facility] will be initiated on 9/21. Will f/u with Ebony Hail on Wednesday.         Expected Discharge Plan: [place] Barriers to Discharge: Continued Medical Work up  Expected Discharge Plan and Services Expected Discharge Plan: [place] In-house Referral: Clinical Social Work Discharge Planning Services: CM Consult Post Acute Care Choice: [place] arrangements for the past 2 months: [place]                 DME Arranged: N/A DME Agency: NA       HH Arranged: NA [place] Agency: NA         Social Determinants of Health (SDOH) Interventions    Readmission Risk  Interventions No flowsheet data found.

## 2020-10-09 NOTE — Progress Notes (Signed)
PROGRESS NOTE    [name]  IRS:[id number] DOB: Dec 15, 1926 DOA: 10/05/2020 PCP: Lavone Orn, MD   Chief Complaint  Patient presents with   Weakness   Brief Narrative/Hospital Course: 87 year old male with HTN, CAD, CABG 2010, AVS with AVR, BPH, dementia readmitted with severe generalized weakness.  Initially hospitalized 9/11-09/2012 after suffering a fall and subsequently being found to have suffered non-ST elevation MI with acute grade 2 diastolic CHF following hospital course was discharged back to independent living facility where it was opined that he required a higher level of care.  EMS was called because of his blood pressure was found to be 170/92 in the ED he was found to have blood pressure 186/80 BUN 28 creatinine 1.6 CT head and cervical spine no acute finding chest x-ray right-sided pleural effusion noted x-ray of the thoracic inlet lumbar spine no acute finding EKG to be in person 2 3 and aVF but no ST elevation.  Patient is admitted for deconditioning so generalized weakness, acute kidney injury. He was hydrated with IV fluids.  Reevaluated by cardiology at time of readmission and did not recommend any further intervention. Is more alert awake but still with PT OT and need a skilled nursing facility for his deconditioning.   Subjective: Seen and examined this morning.  He is alert awake.  Not in acute distress.   Patient reports no new complaints  Assessment & Plan:  Generalized weakness/deconditioning with recurrent falls: Imaging no acute finding MRI brain and cervical spine no acute abnormalities or fracture.  Based on previous records  SNF was suggested during prior hospital day but family declined.  At this time agreeable for skilled nursing facility placement awaiting on placement.   AKI on CKDIIIb: baseline creat ~ 1.6. AKI from dehydration poor intake.  Creatinine improved to baseline encourage oral hydration.  Off IV fluids. Recent Labs    01/23/20 1226  09/10/20 1026 09/29/20 1740 09/30/20 1141 10/01/20 0422 10/02/20 0403 10/03/20 0420 10/05/20 1347 10/07/20 0510 10/08/20 0124  BUN 32 24* 34* 38* 38* 36* 33* 28* 26* 32*  CREATININE 1.58* 1.45* 1.63* 1.78* 1.78* 1.68* 1.49* 1.66* 1.45* 1.52*    Memory difficulty: Mild dementia versus acute delirium, currently stable mental status alert awake at baseline following commands appropriately.MRI brain moderate to severe global parenchymal volume loss suggestive of age related dementia.  Continue delirium precaution fall precaution and supportive care  Recent NSTEMI CAD/CABG  Essential hypertension, benign HLD: Seen by cardiology this admission did not recommend any further intervention.  Continue his home regimen amlodipine and aspirin and statin.  No chest pain.  Monitor vitals..   Chronic grade 2 diastolic CHF: euvolemic on exam  Leukocytosis Thrombocytosis: seems chronic issues with CBC WBC being up to 40 3.5 during last hospitalization outpatient follow-up with hematology has been recommended no evidence of infection, myeloproliferative disorder is in the differential and will need follow-up.  Continue aspirin Recent Labs  Lab 10/05/20 1347 10/07/20 0510 10/08/20 0124  HGB 12.8* 13.6 12.3*  HCT 39.2 40.8 37.6*  WBC 31.5* 35.0* 26.6*  PLT 1,004* 1,319* 1,315*    Body mass index is 18.21 kg/m.  Moderate protein calorie malnutrition, consult dietitian augment diet.  FEN: Diet Order             DIET - DYS 1 Room service appropriate? Yes; Fluid consistency: Thin  Diet effective now                  DVT prophylaxis: enoxaparin (LOVENOX) injection  30 mg Start: 10/06/20 1800 Code Status:   Code Status: DNR  Family Communication: plan of care discussed with patient at bedside.  Status is: Inpatient  Remains inpatient appropriate because:Inpatient level of care appropriate due to severity of illness  Dispo: The patient is from:  ILF              Anticipated d/c is  to: SNF              Patient currently is medically stable    Difficult to place patient No  Objective: Vitals: Today's Vitals   10/08/20 1951 10/08/20 2141 10/09/20 0517 10/09/20 0927  BP: 136/62  (!) 161/65 (!) 121/58  Pulse: 72  67 (!) 58  Resp: 16  16 18   Temp: 98.1 F (36.7 C)  97.9 F (36.6 C) 98.2 F (36.8 C)  TempSrc: Oral  Oral   SpO2: 100%  99% 100%  Weight:      Height:      PainSc:  0-No pain  0-No pain   Examination: General exam: AAO at baseline, elderly, frail.   HEENT:Oral mucosa moist, Ear/Nose WNL grossly, dentition normal. Respiratory system: bilaterally clear breath sounds, no use of accessory muscle Cardiovascular system: S1 & S2 +, No JVD,. Gastrointestinal system: Abdomen soft,NT,ND, BS+ Nervous System:Alert, awake, moving extremities and grossly nonfocal Extremities: no edema, distal peripheral pulses palpable.  Skin: No rashes,no icterus. MSK: Normal muscle bulk,tone, power    Intake/Output Summary (Last 24 hours) at 10/09/2020 1141 Last data filed at 10/09/2020 0800 Gross per 24 hour  Intake 1143.99 ml  Output 1550 ml  Net -406.01 ml    Filed Weights   10/05/20 1226 10/06/20 2010 10/07/20 2138  Weight: 66.9 kg 60.7 kg 60.9 kg   Weight change:    Consultants:see note  Procedures:see note Antimicrobials: Anti-infectives (From admission, onward)    None      Culture/Microbiology    Component Value Date/Time   SDES IN/OUT CATH URINE 10/06/2020 0105   SPECREQUEST NONE 10/06/2020 0105   CULT  10/06/2020 0105    NO GROWTH Performed at [facility], [place] [address], [place], [place] [address]    REPTSTATUS 10/07/2020 FINAL 10/06/2020 0105    Other culture-see note  Unresulted Labs (From admission, onward)     Start     Ordered   10/07/20 0510  Pathologist smear review  Once,   R        10/07/20 0510           Medications reviewed:  Scheduled Meds:  amLODipine  10 mg Oral Daily   aspirin EC  81 mg Oral Daily    atorvastatin  20 mg Oral Daily   enoxaparin (LOVENOX) injection  30 mg Subcutaneous Q24H   latanoprost  1 drop Both Eyes QHS   sodium chloride flush  3 mL Intravenous Q12H   Continuous Infusions:     Intake/Output from previous day: 09/19 0701 - 09/20 0700 In: 1164 [P.O.:574; I.V.:590] Out: 1550 [Urine:1550] Intake/Output this shift: Total I/O In: 200 [P.O.:200] Out: -  Filed Weights   10/05/20 1226 10/06/20 2010 10/07/20 2138  Weight: 66.9 kg 60.7 kg 60.9 kg   Data Reviewed: I have personally reviewed following labs and imaging studies CBC: Recent Labs  Lab 10/03/20 0420 10/05/20 1347 10/07/20 0510 10/08/20 0124  WBC 26.9* 31.5* 35.0* 26.6*  NEUTROABS  --  27.4*  --   --   HGB 11.8* 12.8* 13.6 12.3*  HCT 35.8* 39.2 40.8  37.6*  MCV 89.5 91.8 88.9 90.0  PLT 883* 1,004* 1,319* 1,315*    Basic Metabolic Panel: Recent Labs  Lab 10/03/20 0420 10/05/20 1347 10/07/20 0510 10/08/20 0124  NA 142 142 140 140  K 3.8 4.6 3.8 3.9  CL 108 110 106 109  CO2 24 23 23 23   GLUCOSE 106* 105* 107* 152*  BUN 33* 28* 26* 32*  CREATININE 1.49* 1.66* 1.45* 1.52*  CALCIUM 8.5* 8.7* 8.8* 8.5*  MG  --  2.5*  --   --     GFR: Estimated Creatinine Clearance: 27.3 mL/min (A) (by C-G formula based on SCr of 1.52 mg/dL (H)). Liver Function Tests: No results for input(s): AST, ALT, ALKPHOS, BILITOT, PROT, ALBUMIN in the last 168 hours. No results for input(s): LIPASE, AMYLASE in the last 168 hours. Recent Labs  Lab 10/08/20 0124  AMMONIA 23    Coagulation Profile: No results for input(s): INR, PROTIME in the last 168 hours. Cardiac Enzymes: Recent Labs  Lab 10/07/20 0510  CKTOTAL 49    BNP (last 3 results) No results for input(s): PROBNP in the last 8760 hours. HbA1C: No results for input(s): HGBA1C in the last 72 hours. CBG: No results for input(s): GLUCAP in the last 168 hours. Lipid Profile: No results for input(s): CHOL, HDL, LDLCALC, TRIG, CHOLHDL, LDLDIRECT in  the last 72 hours. Thyroid Function Tests: No results for input(s): TSH, T4TOTAL, FREET4, T3FREE, THYROIDAB in the last 72 hours.  Anemia Panel: Recent Labs    10/08/20 0124  VITAMINB12 1,862*  FOLATE 21.6    Sepsis Labs: No results for input(s): PROCALCITON, LATICACIDVEN in the last 168 hours.   Recent Results (from the past 240 hour(s))  Resp Panel by RT-PCR (Flu A&B, Covid) Nasopharyngeal Swab     Status: None   Collection Time: 09/30/20 11:41 AM   Specimen: Nasopharyngeal Swab; Nasopharyngeal(NP) swabs in vial transport medium  Result Value Ref Range Status   SARS Coronavirus 2 by RT PCR NEGATIVE NEGATIVE Final    Comment: (NOTE) SARS-CoV-2 target nucleic acids are NOT DETECTED.  The SARS-CoV-2 RNA is generally detectable in upper respiratory specimens during the acute phase of infection. The lowest concentration of SARS-CoV-2 viral copies this assay can detect is 138 copies/mL. A negative result does not preclude SARS-Cov-2 infection and should not be used as the sole basis for treatment or other patient management decisions. A negative result may occur with  improper specimen collection/handling, submission of specimen other than nasopharyngeal swab, presence of viral mutation(s) within the areas targeted by this assay, and inadequate number of viral copies(<138 copies/mL). A negative result must be combined with clinical observations, patient history, and epidemiological information. The expected result is Negative.  Fact Sheet for Patients:  EntrepreneurPulse.com.au  Fact Sheet for Healthcare Providers:  IncredibleEmployment.be  This test is no t yet approved or cleared by the [place] FDA and  has been authorized for detection and/or diagnosis of SARS-CoV-2 by FDA under an Emergency Use Authorization (EUA). This EUA will remain  in effect (meaning this test can be used) for the duration of the COVID-19 declaration under  Section 564(b)(1) of the Act, 21 U.S.C.section 360bbb-3(b)(1), unless the authorization is terminated  or revoked sooner.       Influenza A by PCR NEGATIVE NEGATIVE Final   Influenza B by PCR NEGATIVE NEGATIVE Final    Comment: (NOTE) The Xpert Xpress SARS-CoV-2/FLU/RSV plus assay is intended as an aid in the diagnosis of influenza from Nasopharyngeal swab specimens and should  not be used as a sole basis for treatment. Nasal washings and aspirates are unacceptable for Xpert Xpress SARS-CoV-2/FLU/RSV testing.  Fact Sheet for Patients: EntrepreneurPulse.com.au  Fact Sheet for Healthcare Providers: IncredibleEmployment.be  This test is not yet approved or cleared by the [place] FDA and has been authorized for detection and/or diagnosis of SARS-CoV-2 by FDA under an Emergency Use Authorization (EUA). This EUA will remain in effect (meaning this test can be used) for the duration of the COVID-19 declaration under Section 564(b)(1) of the Act, 21 U.S.C. section 360bbb-3(b)(1), unless the authorization is terminated or revoked.  Performed at [facility], [place] [address] [place], [place] [address]   Urine Culture     Status: None   Collection Time: 09/30/20 11:41 AM   Specimen: Urine, Clean Catch  Result Value Ref Range Status   Specimen Description   Final    URINE, CLEAN CATCH Performed at [facility], [place] [address], [place], [place] [address]    Special Requests   Final    NONE Performed at [facility], [place] [address], [place], [place] [address]    Culture   Final    NO GROWTH Performed at [facility], [place] [address], [place], [place] [address]    Report Status 10/01/2020 FINAL  Final  Culture, blood (routine x 2)     Status: None   Collection Time: 09/30/20  6:33 PM   Specimen: BLOOD  Result Value Ref Range Status   Specimen Description   Final    BLOOD LEFT  ANTECUBITAL Performed at [place] [address], [place], [place] [address]    Special Requests   Final    BOTTLES DRAWN AEROBIC AND ANAEROBIC Blood Culture adequate volume Performed at [place] [address], [place], [place] [address]    Culture   Final    NO GROWTH 5 DAYS Performed at [facility], [place] [address], [place], [place] [address]    Report Status 10/05/2020 FINAL  Final  Culture, blood (routine x 2)     Status: None   Collection Time: 09/30/20  6:33 PM   Specimen: BLOOD  Result Value Ref Range Status   Specimen Description   Final    BLOOD BLOOD LEFT HAND Performed at [place] [address], [place], [place] [address]    Special Requests   Final    BOTTLES DRAWN AEROBIC AND ANAEROBIC Blood Culture adequate volume Performed at [place] [address], [place], [place] [address]    Culture   Final    NO GROWTH 5 DAYS Performed at [facility], [place] [address] [place], [place] [address]    Report Status 10/05/2020 FINAL  Final  Resp Panel by RT-PCR (Flu A&B, Covid) Nasopharyngeal Swab     Status: None   Collection Time: 10/05/20  1:47 PM   Specimen: Nasopharyngeal Swab; Nasopharyngeal(NP) swabs in vial transport medium  Result Value Ref Range Status   SARS Coronavirus 2 by RT PCR NEGATIVE NEGATIVE Final    Comment: (NOTE) SARS-CoV-2 target nucleic acids are NOT DETECTED.  The SARS-CoV-2 RNA is generally detectable in upper respiratory specimens during the acute phase of infection. The lowest concentration of SARS-CoV-2 viral copies this assay can detect is 138 copies/mL. A negative result does not preclude SARS-Cov-2 infection and should not be used as the sole basis for treatment or other patient management decisions.  A negative result may occur with  improper specimen collection/handling, submission of specimen other than nasopharyngeal swab, presence  of viral mutation(s) within the areas targeted by this assay, and inadequate number of viral copies(<138 copies/mL). A negative result must be combined with clinical observations, patient history, and epidemiological information. The expected result is Negative.  Fact Sheet for Patients:  EntrepreneurPulse.com.au  Fact Sheet for Healthcare Providers:  IncredibleEmployment.be  This test is no t yet approved or cleared by the [place] FDA and  has been authorized for detection and/or diagnosis of SARS-CoV-2 by FDA under an Emergency Use Authorization (EUA). This EUA will remain  in effect (meaning this test can be used) for the duration of the COVID-19 declaration under Section 564(b)(1) of the Act, 21 U.S.C.section 360bbb-3(b)(1), unless the authorization is terminated  or revoked sooner.       Influenza A by PCR NEGATIVE NEGATIVE Final   Influenza B by PCR NEGATIVE NEGATIVE Final    Comment: (NOTE) The Xpert Xpress SARS-CoV-2/FLU/RSV plus assay is intended as an aid in the diagnosis of influenza from Nasopharyngeal swab specimens and should not be used as a sole basis for treatment. Nasal washings and aspirates are unacceptable for Xpert Xpress SARS-CoV-2/FLU/RSV testing.  Fact Sheet for Patients: EntrepreneurPulse.com.au  Fact Sheet for Healthcare Providers: IncredibleEmployment.be  This test is not yet approved or cleared by the [place] FDA and has been authorized for detection and/or diagnosis of SARS-CoV-2 by FDA under an Emergency Use Authorization (EUA). This EUA will remain in effect (meaning this test can be used) for the duration of the COVID-19 declaration under Section 564(b)(1) of the Act, 21 U.S.C. section 360bbb-3(b)(1), unless the authorization is terminated or revoked.  Performed at [facility], [place] [address], [place], [place] [address]   Urine Culture     Status:  None   Collection Time: 10/06/20  1:05 AM   Specimen: In/Out Cath Urine  Result Value Ref Range Status   Specimen Description IN/OUT CATH URINE  Final   Special Requests NONE  Final   Culture   Final    NO GROWTH Performed at [facility], [place] [address] [place], [place] [address]    Report Status 10/07/2020 FINAL  Final      Radiology Studies: No results found.   LOS: 1 day   Antonieta Pert, MD [facility]  10/09/2020, 11:41 AM

## 2020-10-10 DIAGNOSIS — E43 Unspecified severe protein-calorie malnutrition: Secondary | ICD-10-CM | POA: Insufficient documentation

## 2020-10-10 MED ORDER — ADULT MULTIVITAMIN W/MINERALS CH
1.0000 | ORAL_TABLET | Freq: Every day | ORAL | Status: DC
  Administered 2020-10-10 – 2020-10-11 (×2): 1 via ORAL
  Filled 2020-10-10 (×2): qty 1

## 2020-10-10 MED ORDER — ENSURE ENLIVE PO LIQD
237.0000 mL | Freq: Three times a day (TID) | ORAL | Status: DC
  Administered 2020-10-10 – 2020-10-11 (×4): 237 mL via ORAL

## 2020-10-10 NOTE — Progress Notes (Signed)
Initial Nutrition Assessment  DOCUMENTATION CODES:  Severe malnutrition in context of chronic illness, Underweight  INTERVENTION:  Add Ensure Enlive po TID, each supplement provides 350 kcal and 20 grams of protein.  Add Magic cup TID with meals, each supplement provides 290 kcal and 9 grams of protein.  Add MVI with minerals daily.  Encourage PO intake.  NUTRITION DIAGNOSIS:  Severe Malnutrition related to chronic illness (dementia) as evidenced by percent weight loss, severe fat depletion, severe muscle depletion.  GOAL:  Patient will meet greater than or equal to 90% of their needs  MONITOR:  PO intake, Supplement acceptance, Diet advancement, Labs, Weight trends, Skin, I & O's  REASON FOR ASSESSMENT:  Consult Assessment of nutrition requirement/status  ASSESSMENT:  87 yo male with a PMH of HTN, CAD, CABG 2010, AVS with AVR, BPH, and dementia readmitted with severe generalized weakness.  Initially hospitalized 9/11-9/14 after suffering a fall and subsequently being found to have suffered non-ST elevation MI with acute grade 2 diastolic CHF following hospital course was discharged back to independent living facility where it was opined that he required a higher level of care.  RD working remotely. RD attempted to call patient's room phone, but patient did not answer. Another RD on-site obtained NFPE.  TOC involved at this time waiting for approval for SNF.  Pt eating well, over the last 8 meals, pt consuming an average of 81%, ranging from 0-100%.  Per Epic, pt has lost 13 lbs (9%) in the last week, which is significant and severe for the time frame.  Recommend adding Ensure TID, Magic Cup TID, and MVI with minerals.  Medications: reviewed  Labs: reviewed; Glucose 152 (H), BUN 32 (H), Crt 1.52 (H)  NUTRITION - FOCUSED PHYSICAL EXAM: Flowsheet Row Most Recent Value  Orbital Region Severe depletion  Upper Arm Region Severe depletion  Thoracic and Lumbar Region Moderate  depletion  Buccal Region Severe depletion  Temple Region Moderate depletion  Clavicle Bone Region Moderate depletion  Clavicle and Acromion Bone Region Moderate depletion  Scapular Bone Region Moderate depletion  Dorsal Hand Moderate depletion  Patellar Region Severe depletion  Anterior Thigh Region Severe depletion  Posterior Calf Region Severe depletion  Edema (RD Assessment) Mild  Hair Reviewed  Eyes Reviewed  Mouth Reviewed  Skin Reviewed  Nails Reviewed   Diet Order:   Diet Order             DIET - DYS 1 Room service appropriate? Yes; Fluid consistency: Thin  Diet effective now                  EDUCATION NEEDS:  Not appropriate for education at this time  Skin:  Skin Assessment: Skin Integrity Issues: Skin Integrity Issues:: Stage II, Other (Comment) Stage II: Pressure Injuries - L & R buttocks Other: MASD - scrotum  Last BM:  PTA  Height:  Ht Readings from Last 1 Encounters:  10/06/20 6' (1.829 m)   Weight:  Wt Readings from Last 1 Encounters:  10/07/20 60.9 kg   BMI:  Body mass index is 18.21 kg/m.  Estimated Nutritional Needs:  Kcal:  1700-1900 Protein:  85-100 grams Fluid:  >1.7 L  Derrel Nip, RD, LDN (she/her/hers) Registered Dietitian I After-Hours/Weekend Pager # in [place]

## 2020-10-10 NOTE — TOC Progression Note (Addendum)
Transition of Care [facility]) - Progression Note    Patient Details  Name: [name] MRN: [id number] Date of Birth: 11-02-1926  Transition of Care [facility]) CM/SW Contact  Sharlet Salina Mila Homer, LCSW Phone Number: 10/10/2020, 6:21 PM  Clinical Narrative:  Insurance authorization received today for patient to discharge to [facility]. Auth ID #[id number] effective 9/21 - 9/23. CSW talked with admissions director Ebony Hail and was advised that the patient's COVID card was needed (not just the COVID dates). Talked several times with patient's son Shanon Brow, who was active in trying to talk with staff at [facility] to get this information. CSW was able to talk with Amber [phone]) at [facility] and was informed that they had records of 2 COVID vaccinations: 02/10/19 and 03/10/19.  Patient reportedly got the booster on 11/24/19 but they do not have verification of this. CSW was sent vaccination information for patient and his wife. CSW was informed by CSW [place] that the only vaccination showing in state records is 02/10/19 for patient. Ebony Hail, admissions director was kept updated and at her request was given Amber's phone. Patient's attending was updated.   CSW will follow-up with Ebony Hail, admissions liaison for [facility] on Thursday, 9/22 regarding patient's discharge..    Expected Discharge Plan: Skilled Nursing Facility Barriers to Discharge: Continued Medical Work up  Expected Discharge Plan and Services Expected Discharge Plan: [place] In-house Referral: Clinical Social Work Discharge Planning Services: CM Consult Post Acute Care Choice: [place] arrangements for the past 2 months: [place]                 DME Arranged: N/A DME Agency: NA       HH Arranged: NA [place] Agency: NA         Social Determinants of Health (SDOH) Interventions    Readmission Risk Interventions No flowsheet data found.

## 2020-10-10 NOTE — Progress Notes (Addendum)
PROGRESS NOTE    [name]  PXT:[id number] DOB: 1926-12-14 DOA: 10/05/2020 PCP: Lavone Orn, MD   Chief Complaint  Patient presents with   Weakness   Brief Narrative/Hospital Course: 87 year old male with HTN, CAD, CABG 2010, AVS with AVR, BPH, dementia readmitted with severe generalized weakness.  Initially hospitalized 9/11-09/2012 after suffering a fall and subsequently being found to have suffered non-ST elevation MI with acute grade 2 diastolic CHF following hospital course was discharged back to independent living facility where it was opined that he required a higher level of care.  EMS was called because of his blood pressure was found to be 170/92 in the ED he was found to have blood pressure 186/80 BUN 28 creatinine 1.6 CT head and cervical spine no acute finding chest x-ray right-sided pleural effusion noted x-ray of the thoracic inlet lumbar spine no acute finding EKG to be in person 2 3 and aVF but no ST elevation.  Patient is admitted for deconditioning so generalized weakness, acute kidney injury. He was hydrated with IV fluids.  Reevaluated by cardiology at time of readmission and did not recommend any further intervention. Is more alert awake but still with PT OT and need a skilled nursing facility for his deconditioning. TOC involved at this time waiting for approval for SNF  Subjective:  Overnight afebrile. Resting comfortably.  Is alert awake at baseline.  Follows commands appropriately.  Waiting for placement.  Assessment & Plan:  Generalized weakness/deconditioning with recurrent falls: Imaging no acute finding MRI brain and cervical spine no acute abnormalities or fracture.  Based on previous records  SNF was suggested during prior hospital day but family declined.  Pending skilled nursing facility placement.   AKI on CKDIIIb: baseline creat ~ 1.6. AKI from dehydration poor intake.  Renal function improved to baseline.  Encourage oral hydration.  Off IV  fluids.  Recent Labs    01/23/20 1226 09/10/20 1026 09/29/20 1740 09/30/20 1141 10/01/20 0422 10/02/20 0403 10/03/20 0420 10/05/20 1347 10/07/20 0510 10/08/20 0124  BUN 32 24* 34* 38* 38* 36* 33* 28* 26* 32*  CREATININE 1.58* 1.45* 1.63* 1.78* 1.78* 1.68* 1.49* 1.66* 1.45* 1.52*    Memory difficulty:Mild dementia versus acute delirium.mental status improved to baseline alert awake communicating.MRI brain moderate to severe global parenchymal volume loss suggestive of age related dementia.  Continue delirium precaution fall precaution and supportive care  Recent NSTEMI CAD/CABG  Essential hypertension, benign HLD Chronic grade 2 diastolic CHF: Seen by cardiology this admission did not recommend any further intervention.  Continue his home regimen amlodipine, aspirin and statin.  CHF remains stable and euvolemic.   Leukocytosis Thrombocytosis: seems chronic issues with CBC WBC being up to 40 3.5 during last hospitalization outpatient follow-up with hematology has been recommended no evidence of infection, myeloproliferative disorder is in the differential and will need follow-up.  Continue aspirin Recent Labs  Lab 10/05/20 1347 10/07/20 0510 10/08/20 0124  HGB 12.8* 13.6 12.3*  HCT 39.2 40.8 37.6*  WBC 31.5* 35.0* 26.6*  PLT 1,004* 1,319* 1,315*    Body mass index is 18.21 kg/m.  Severe protein calorie malnutrition, consult dietitian augment diet.  FEN: Diet Order             DIET - DYS 1 Room service appropriate? Yes; Fluid consistency: Thin  Diet effective now                  DVT prophylaxis: enoxaparin (LOVENOX) injection 30 mg Start: 10/06/20 1800 Code Status:  Code Status: DNR  Family Communication: plan of care discussed with patient at bedside.  Status is: Inpatient  Remains inpatient appropriate because:Inpatient level of care appropriate due to severity of illness  Dispo: The patient is from:  ILF              Anticipated d/c is to: SNF               Patient currently is medically stable    Difficult to place patient No  Objective: Vitals: Today's Vitals   10/09/20 2053 10/09/20 2105 10/10/20 0626 10/10/20 0738  BP:  (!) 143/61 (!) 162/69   Pulse:  66 74   Resp:  18 18   Temp:  97.8 F (36.6 C) 97.6 F (36.4 C)   TempSrc:   Oral   SpO2:  100% 98%   Weight:      Height:      PainSc: Asleep   Asleep   Examination: General exam: AAO, with baseline mental status,older than stated age, weak withappearing. HEENT:Oral mucosa moist, Ear/Nose WNL grossly, dentition normal. Respiratory system: bilaterally diminished, no use of accessory muscle Cardiovascular system: S1 & S2 +, No JVD,. Gastrointestinal system: Abdomen soft, NT,ND, BS+ Nervous System:Alert, awake, moving extremities and grossly nonfocal Extremities: nO edema, distal peripheral pulses palpable.  Skin: No rashes,no icterus. MSK: Normal muscle bulk,tone, power    Intake/Output Summary (Last 24 hours) at 10/10/2020 0804 Last data filed at 10/09/2020 1841 Gross per 24 hour  Intake 300 ml  Output 400 ml  Net -100 ml    Filed Weights   10/05/20 1226 10/06/20 2010 10/07/20 2138  Weight: 66.9 kg 60.7 kg 60.9 kg   Weight change:    Consultants:see note  Procedures:see note Antimicrobials: Anti-infectives (From admission, onward)    None      Culture/Microbiology    Component Value Date/Time   SDES IN/OUT CATH URINE 10/06/2020 0105   SPECREQUEST NONE 10/06/2020 0105   CULT  10/06/2020 0105    NO GROWTH Performed at [facility], [place] [address] [place], [place] [address]    REPTSTATUS 10/07/2020 FINAL 10/06/2020 0105    Other culture-see note  Unresulted Labs (From admission, onward)    None      Medications reviewed:  Scheduled Meds:  amLODipine  10 mg Oral Daily   aspirin EC  81 mg Oral Daily   atorvastatin  20 mg Oral Daily   enoxaparin (LOVENOX) injection  30 mg Subcutaneous Q24H   latanoprost  1 drop Both Eyes QHS   sodium  chloride flush  3 mL Intravenous Q12H   Continuous Infusions: Intake/Output from previous day: 09/20 0701 - 09/21 0700 In: 500 [P.O.:500] Out: 400 [Urine:400] Intake/Output this shift: No intake/output data recorded. Filed Weights   10/05/20 1226 10/06/20 2010 10/07/20 2138  Weight: 66.9 kg 60.7 kg 60.9 kg   Data Reviewed: I have personally reviewed following labs and imaging studies CBC: Recent Labs  Lab 10/05/20 1347 10/07/20 0510 10/08/20 0124  WBC 31.5* 35.0* 26.6*  NEUTROABS 27.4*  --   --   HGB 12.8* 13.6 12.3*  HCT 39.2 40.8 37.6*  MCV 91.8 88.9 90.0  PLT 1,004* 1,319* 1,315*    Basic Metabolic Panel: Recent Labs  Lab 10/05/20 1347 10/07/20 0510 10/08/20 0124  NA 142 140 140  K 4.6 3.8 3.9  CL 110 106 109  CO2 23 23 23   GLUCOSE 105* 107* 152*  BUN 28* 26* 32*  CREATININE 1.66* 1.45* 1.52*  CALCIUM 8.7* 8.8* 8.5*  MG 2.5*  --   --     GFR: Estimated Creatinine Clearance: 27.3 mL/min (A) (by C-G formula based on SCr of 1.52 mg/dL (H)). Liver Function Tests: No results for input(s): AST, ALT, ALKPHOS, BILITOT, PROT, ALBUMIN in the last 168 hours. No results for input(s): LIPASE, AMYLASE in the last 168 hours. Recent Labs  Lab 10/08/20 0124  AMMONIA 23    Coagulation Profile: No results for input(s): INR, PROTIME in the last 168 hours. Cardiac Enzymes: Recent Labs  Lab 10/07/20 0510  CKTOTAL 49    BNP (last 3 results) No results for input(s): PROBNP in the last 8760 hours. HbA1C: No results for input(s): HGBA1C in the last 72 hours. CBG: No results for input(s): GLUCAP in the last 168 hours. Lipid Profile: No results for input(s): CHOL, HDL, LDLCALC, TRIG, CHOLHDL, LDLDIRECT in the last 72 hours. Thyroid Function Tests: No results for input(s): TSH, T4TOTAL, FREET4, T3FREE, THYROIDAB in the last 72 hours.  Anemia Panel: Recent Labs    10/08/20 0124  VITAMINB12 1,862*  FOLATE 21.6    Sepsis Labs: No results for input(s):  PROCALCITON, LATICACIDVEN in the last 168 hours.   Recent Results (from the past 240 hour(s))  Resp Panel by RT-PCR (Flu A&B, Covid) Nasopharyngeal Swab     Status: None   Collection Time: 09/30/20 11:41 AM   Specimen: Nasopharyngeal Swab; Nasopharyngeal(NP) swabs in vial transport medium  Result Value Ref Range Status   SARS Coronavirus 2 by RT PCR NEGATIVE NEGATIVE Final    Comment: (NOTE) SARS-CoV-2 target nucleic acids are NOT DETECTED.  The SARS-CoV-2 RNA is generally detectable in upper respiratory specimens during the acute phase of infection. The lowest concentration of SARS-CoV-2 viral copies this assay can detect is 138 copies/mL. A negative result does not preclude SARS-Cov-2 infection and should not be used as the sole basis for treatment or other patient management decisions. A negative result may occur with  improper specimen collection/handling, submission of specimen other than nasopharyngeal swab, presence of viral mutation(s) within the areas targeted by this assay, and inadequate number of viral copies(<138 copies/mL). A negative result must be combined with clinical observations, patient history, and epidemiological information. The expected result is Negative.  Fact Sheet for Patients:  EntrepreneurPulse.com.au  Fact Sheet for Healthcare Providers:  IncredibleEmployment.be  This test is no t yet approved or cleared by the [place] FDA and  has been authorized for detection and/or diagnosis of SARS-CoV-2 by FDA under an Emergency Use Authorization (EUA). This EUA will remain  in effect (meaning this test can be used) for the duration of the COVID-19 declaration under Section 564(b)(1) of the Act, 21 U.S.C.section 360bbb-3(b)(1), unless the authorization is terminated  or revoked sooner.       Influenza A by PCR NEGATIVE NEGATIVE Final   Influenza B by PCR NEGATIVE NEGATIVE Final    Comment: (NOTE) The Xpert Xpress  SARS-CoV-2/FLU/RSV plus assay is intended as an aid in the diagnosis of influenza from Nasopharyngeal swab specimens and should not be used as a sole basis for treatment. Nasal washings and aspirates are unacceptable for Xpert Xpress SARS-CoV-2/FLU/RSV testing.  Fact Sheet for Patients: EntrepreneurPulse.com.au  Fact Sheet for Healthcare Providers: IncredibleEmployment.be  This test is not yet approved or cleared by the [place] FDA and has been authorized for detection and/or diagnosis of SARS-CoV-2 by FDA under an Emergency Use Authorization (EUA). This EUA will remain in effect (meaning this test can be used) for  the duration of the COVID-19 declaration under Section 564(b)(1) of the Act, 21 U.S.C. section 360bbb-3(b)(1), unless the authorization is terminated or revoked.  Performed at [facility], [place] [address] [place], [place] [address]   Urine Culture     Status: None   Collection Time: 09/30/20 11:41 AM   Specimen: Urine, Clean Catch  Result Value Ref Range Status   Specimen Description   Final    URINE, CLEAN CATCH Performed at [facility], [place] [address], [place], [place] [address]    Special Requests   Final    NONE Performed at [facility], [place] [address], [place], [place] [address]    Culture   Final    NO GROWTH Performed at [facility], [place] [address], [place], [place] [address]    Report Status 10/01/2020 FINAL  Final  Culture, blood (routine x 2)     Status: None   Collection Time: 09/30/20  6:33 PM   Specimen: BLOOD  Result Value Ref Range Status   Specimen Description   Final    BLOOD LEFT ANTECUBITAL Performed at [place] [address] [place], [place] [address]    Special Requests   Final    BOTTLES DRAWN AEROBIC AND ANAEROBIC Blood Culture adequate volume Performed at [place]  [address], [place], [place] [address]    Culture   Final    NO GROWTH 5 DAYS Performed at [facility], [place] [address], [place], [place] [address]    Report Status 10/05/2020 FINAL  Final  Culture, blood (routine x 2)     Status: None   Collection Time: 09/30/20  6:33 PM   Specimen: BLOOD  Result Value Ref Range Status   Specimen Description   Final    BLOOD BLOOD LEFT HAND Performed at [place] [address], [place], [place] [address]    Special Requests   Final    BOTTLES DRAWN AEROBIC AND ANAEROBIC Blood Culture adequate volume Performed at [place] [address], [place], [place] [address]    Culture   Final    NO GROWTH 5 DAYS Performed at [facility], [place] [address], [place], [place] [address]    Report Status 10/05/2020 FINAL  Final  Resp Panel by RT-PCR (Flu A&B, Covid) Nasopharyngeal Swab     Status: None   Collection Time: 10/05/20  1:47 PM   Specimen: Nasopharyngeal Swab; Nasopharyngeal(NP) swabs in vial transport medium  Result Value Ref Range Status   SARS Coronavirus 2 by RT PCR NEGATIVE NEGATIVE Final    Comment: (NOTE) SARS-CoV-2 target nucleic acids are NOT DETECTED.  The SARS-CoV-2 RNA is generally detectable in upper respiratory specimens during the acute phase of infection. The lowest concentration of SARS-CoV-2 viral copies this assay can detect is 138 copies/mL. A negative result does not preclude SARS-Cov-2 infection and should not be used as the sole basis for treatment or other patient management decisions. A negative result may occur with  improper specimen collection/handling, submission of specimen other than nasopharyngeal swab, presence of viral mutation(s) within the areas targeted by this assay, and inadequate number of viral copies(<138 copies/mL). A negative result must be combined with clinical observations, patient history, and epidemiological information. The expected result  is Negative.  Fact Sheet for Patients:  EntrepreneurPulse.com.au  Fact Sheet for Healthcare Providers:  IncredibleEmployment.be  This test is no t yet approved  or cleared by the [place] and  has been authorized for detection and/or diagnosis of SARS-CoV-2 by FDA under an Emergency Use Authorization (EUA). This EUA will remain  in effect (meaning this test can be used) for the duration of the COVID-19 declaration under Section 564(b)(1) of the Act, 21 U.S.C.section 360bbb-3(b)(1), unless the authorization is terminated  or revoked sooner.       Influenza A by PCR NEGATIVE NEGATIVE Final   Influenza B by PCR NEGATIVE NEGATIVE Final    Comment: (NOTE) The Xpert Xpress SARS-CoV-2/FLU/RSV plus assay is intended as an aid in the diagnosis of influenza from Nasopharyngeal swab specimens and should not be used as a sole basis for treatment. Nasal washings and aspirates are unacceptable for Xpert Xpress SARS-CoV-2/FLU/RSV testing.  Fact Sheet for Patients: EntrepreneurPulse.com.au  Fact Sheet for Healthcare Providers: IncredibleEmployment.be  This test is not yet approved or cleared by the [place] FDA and has been authorized for detection and/or diagnosis of SARS-CoV-2 by FDA under an Emergency Use Authorization (EUA). This EUA will remain in effect (meaning this test can be used) for the duration of the COVID-19 declaration under Section 564(b)(1) of the Act, 21 U.S.C. section 360bbb-3(b)(1), unless the authorization is terminated or revoked.  Performed at [facility], [place] [address], [place], [place] [address]   Urine Culture     Status: None   Collection Time: 10/06/20  1:05 AM   Specimen: In/Out Cath Urine  Result Value Ref Range Status   Specimen Description IN/OUT CATH URINE  Final   Special Requests NONE  Final   Culture   Final    NO GROWTH Performed at [place], [place] [address] [place], [place] [address]    Report Status 10/07/2020 FINAL  Final      Radiology Studies: No results found.   LOS: 2 days   Antonieta Pert, MD [facility]  10/10/2020, 8:04 AM

## 2020-10-10 NOTE — Discharge Summary (Signed)
**Note Allen-Identified via Obfuscation** Physician Discharge Summary  DMARCO Bowman QMG:[id number] DOB: [birth year] DOA: 10/05/2020  PCP: Lavone Orn, MD  Admit date: 10/05/2020 Discharge date: 10/11/2020  Admitted From: home Disposition:  SNF  Recommendations for Outpatient Follow-up:  Follow up with PCP in 1-2 weeks Please obtain BMP/CBC in one week Please follow up on the following pending results:  Home Health:NO  Equipment/Devices: NON  Discharge Condition: Stable Code Status:   Code Status: DNR Diet recommendation:  Diet Order             DIET - DYS 1 Room service appropriate? Yes; Fluid consistency: Thin  Diet effective now                    Brief/Interim Summary:  87 year old male with HTN, CAD, CABG 2010, AVS with AVR, BPH, dementia readmitted with severe generalized weakness.  Initially hospitalized 9/11-09/2012 after suffering a fall and subsequently being found to have suffered non-ST elevation MI with acute grade 2 diastolic CHF following hospital course was discharged back to independent living facility where it was opined that he required a higher level of care.  EMS was called because of his blood pressure was found to be 170/92 in the ED he was found to have blood pressure 186/80 BUN 28 creatinine 1.6 CT head and cervical spine no acute finding chest x-ray right-sided pleural effusion noted x-ray of the thoracic inlet lumbar spine no acute finding EKG to be in person 2 3 and aVF but no ST elevation.  Patient is admitted for deconditioning so generalized weakness, acute kidney injury. He was hydrated with IV fluids.  Reevaluated by cardiology at time of readmission and did not recommend any further intervention. Is more alert awake but still with PT OT and need a skilled nursing facility for his deconditioning. TOC involved at this time waiting for approval for SNF. Authorization received patient could not get discharged 9/21 pending documentation of vaccine card  Discharge Diagnoses:   Generalized  weakness/deconditioning with recurrent falls: Imaging no acute finding MRI brain and cervical spine no acute abnormalities or fracture.  Based on previous records  SNF was suggested during prior hospital day but family declined.  Pending skilled nursing facility placement.    AKI on CKDIIIb: baseline creat ~ 1.6. AKI from dehydration poor intake.  Renal function improved to baseline.  Encourage oral hydration.  Off IV fluids.   Memory difficulty:Mild dementia versus acute delirium.mental status improved to baseline alert awake communicating.MRI brain moderate to severe global parenchymal volume loss suggestive of age related dementia.  Continue delirium precaution fall precaution and supportive care   Recent NSTEMI CAD/CABG  Essential hypertension, benign HLD Chronic grade 2 diastolic CHF: Seen by cardiology this admission did not recommend any further intervention.  Continue his home regimen amlodipine, aspirin and statin.  CHF remains stable and euvolemic.    Leukocytosis Thrombocytosis: seems chronic issues with CBC WBC being up to 40 3.5 during last hospitalization outpatient follow-up with hematology has been recommended no evidence of infection, myeloproliferative disorder is in the differential and will need follow-up.  Continue aspirin Last Labs        Recent Labs  Lab 10/05/20 1347 10/07/20 0510 10/08/20 0124  HGB 12.8* 13.6 12.3*  HCT 39.2 40.8 37.6*  WBC 31.5* 35.0* 26.6*  PLT 1,004* 1,319* 1,315*       Body mass index is 18.21 kg/m.  Severe protein calorie malnutrition, consulted dietitian augment diet. Pressure Ulcer: Pressure Injury 09/30/20 Buttocks Left Stage 2 -  Partial thickness loss of dermis presenting as a shallow open injury with a red, pink wound bed without slough. wound red/purple dime sized (Active)  09/30/20 1758  Location: Buttocks  Location Orientation: Left  Staging: Stage 2 -  Partial thickness loss of dermis presenting as a shallow open injury  with a red, pink wound bed without slough.  Wound Description (Comments): wound red/purple dime sized  Present on Admission: Yes     Pressure Injury 09/30/20 Buttocks Right Stage 2 -  Partial thickness loss of dermis presenting as a shallow open injury with a red, pink wound bed without slough. re/purple dime sized wound (Active)  09/30/20 1759  Location: Buttocks  Location Orientation: Right  Staging: Stage 2 -  Partial thickness loss of dermis presenting as a shallow open injury with a red, pink wound bed without slough.  Wound Description (Comments): re/purple dime sized wound  Present on Admission: Yes    Consults: RD  Subjective: Seen examined this morning.  He is alert awake oriented at baseline.  Feels ready for skilled nursing facility.  Discharge Exam: Vitals:   10/10/20 2134 10/11/20 0600  BP: 138/71 (!) 150/65  Pulse: 72 75  Resp: 18 16  Temp: 98 F (36.7 C) 98.1 F (36.7 C)  SpO2: 97% 100%   General: Pt is alert, awake, not in acute distress Cardiovascular: RRR, S1/S2 +, no rubs, no gallops Respiratory: CTA bilaterally, no wheezing, no rhonchi Abdominal: Soft, NT, ND, bowel sounds + Extremities: no edema, no cyanosis  Discharge Instructions  Discharge Instructions     Discharge instructions   Complete by: As directed    Check CBC and BMP in 1 week at the facility  Please call call MD or return to ER for similar or worsening recurring problem that brought you to hospital or if any fever,nausea/vomiting,abdominal pain, uncontrolled pain, chest pain,  shortness of breath or any other alarming symptoms.  Please follow-up your doctor as instructed in a week time and call the office for appointment.  Please avoid alcohol, smoking, or any other illicit substance and maintain healthy habits including taking your regular medications as prescribed.  You were cared for by a hospitalist during your hospital stay. If you have any questions about your discharge  medications or the care you received while you were in the hospital after you are discharged, you can call the unit and ask to speak with the hospitalist on call if the hospitalist that took care of you is not available.  Once you are discharged, your primary care physician will handle any further medical issues. Please note that NO REFILLS for any discharge medications will be authorized once you are discharged, as it is imperative that you return to your primary care physician (or establish a relationship with a primary care physician if you do not have one) for your aftercare needs so that they can reassess your need for medications and monitor your lab values   Discharge wound care:   Complete by: As directed    Keep the groin area and scrotum area dry      Allergies as of 10/11/2020   No Known Allergies      Medication List     TAKE these medications    acetaminophen 500 MG tablet Commonly known as: TYLENOL Take 500 mg by mouth every 6 (six) hours as needed for moderate pain or headache.   amLODipine 10 MG tablet Commonly known as: NORVASC Take 1 tablet (10 mg total) by mouth daily.  amoxicillin 500 MG tablet Commonly known as: AMOXIL TAKE 4 TABLETS BY MOUTH 1 HOUR BEFORE DENTAL PROCEDURE What changed:  how much to take how to take this when to take this additional instructions   aspirin EC 81 MG tablet Take 81 mg by mouth daily.   atorvastatin 20 MG tablet Commonly known as: LIPITOR Take 1 tablet (20 mg total) by mouth daily.   feeding supplement Liqd Take 237 mLs by mouth 3 (three) times daily between meals.   furosemide 20 MG tablet Commonly known as: Lasix Take 1 tablet (20 mg total) by mouth daily.   latanoprost 0.005 % ophthalmic solution Commonly known as: XALATAN Place 1 drop into both eyes at bedtime.   multivitamin with minerals tablet Take 1 tablet by mouth daily.               Discharge Care Instructions  (From admission, onward)            Start     Ordered   10/11/20 0000  Discharge wound care:       Comments: Keep the groin area and scrotum area dry   10/11/20 0736            Contact information for follow-up providers     Lavone Orn, MD Follow up in 1 week(s).   Specialty: Internal Medicine Contact information: [address] [place] [address] [phone]         Jettie Booze, MD .   Specialties: Cardiology, Radiology, Interventional Cardiology Contact information: [address] [place] [address] [phone]              Contact information for after-discharge care     [facility] .   Service: Skilled Nursing Contact information: [address] [place] [phone]                    No Known Allergies  The results of significant diagnostics from this hospitalization (including imaging, microbiology, ancillary and laboratory) are listed below for reference.    Microbiology: Recent Results (from the past 240 hour(s))  Resp Panel by RT-PCR (Flu A&B, Covid) Nasopharyngeal Swab     Status: None   Collection Time: 10/05/20  1:47 PM   Specimen: Nasopharyngeal Swab; Nasopharyngeal(NP) swabs in vial transport medium  Result Value Ref Range Status   SARS Coronavirus 2 by RT PCR NEGATIVE NEGATIVE Final    Comment: (NOTE) SARS-CoV-2 target nucleic acids are NOT DETECTED.  The SARS-CoV-2 RNA is generally detectable in upper respiratory specimens during the acute phase of infection. The lowest concentration of SARS-CoV-2 viral copies this assay can detect is 138 copies/mL. A negative result does not preclude SARS-Cov-2 infection and should not be used as the sole basis for treatment or other patient management decisions. A negative result may occur with  improper specimen collection/handling, submission of specimen other than nasopharyngeal  swab, presence of viral mutation(s) within the areas targeted by this assay, and inadequate number of viral copies(<138 copies/mL). A negative result must be combined with clinical observations, patient history, and epidemiological information. The expected result is Negative.  Fact Sheet for Patients:  EntrepreneurPulse.com.au  Fact Sheet for Healthcare Providers:  IncredibleEmployment.be  This test is no t yet approved or cleared by the [place] FDA and  has been authorized for detection and/or diagnosis of SARS-CoV-2 by FDA under an Emergency Use Authorization (EUA). This  EUA will remain  in effect (meaning this test can be used) for the duration of the COVID-19 declaration under Section 564(b)(1) of the Act, 21 U.S.C.section 360bbb-3(b)(1), unless the authorization is terminated  or revoked sooner.       Influenza A by PCR NEGATIVE NEGATIVE Final   Influenza B by PCR NEGATIVE NEGATIVE Final    Comment: (NOTE) The Xpert Xpress SARS-CoV-2/FLU/RSV plus assay is intended as an aid in the diagnosis of influenza from Nasopharyngeal swab specimens and should not be used as a sole basis for treatment. Nasal washings and aspirates are unacceptable for Xpert Xpress SARS-CoV-2/FLU/RSV testing.  Fact Sheet for Patients: EntrepreneurPulse.com.au  Fact Sheet for Healthcare Providers: IncredibleEmployment.be  This test is not yet approved or cleared by the [place] FDA and has been authorized for detection and/or diagnosis of SARS-CoV-2 by FDA under an Emergency Use Authorization (EUA). This EUA will remain in effect (meaning this test can be used) for the duration of the COVID-19 declaration under Section 564(b)(1) of the Act, 21 U.S.C. section 360bbb-3(b)(1), unless the authorization is terminated or revoked.  Performed at [facility], [place] [address], [place], [place] [address]   Urine  Culture     Status: None   Collection Time: 10/06/20  1:05 AM   Specimen: In/Out Cath Urine  Result Value Ref Range Status   Specimen Description IN/OUT CATH URINE  Final   Special Requests NONE  Final   Culture   Final    NO GROWTH Performed at [facility], [place] [address], [place], [place] [address]    Report Status 10/07/2020 FINAL  Final    Procedures/Studies: DG Chest 2 View  Result Date: 09/29/2020 CLINICAL DATA:  Fall EXAM: CHEST - 2 VIEW COMPARISON:  09/10/2020, 04/08/2015 FINDINGS: Post sternotomy changes. Cardiomegaly with aortic atherosclerosis. Chronic pleural and parenchymal scarring at the left base. Mild interstitial prominence could be due to edema. Aortic valve prosthesis. No visible pneumothorax. IMPRESSION: 1. Cardiomegaly and mild diffuse interstitial opacity which could be due to mild edema 2. Chronic pleural and parenchymal scarring at left lung base. Electronically Signed   By: Donavan Foil M.D.   On: 09/29/2020 17:20   DG Thoracic Spine 2 View  Result Date: 10/06/2020 CLINICAL DATA:  Back pain related to fall EXAM: THORACIC SPINE 2 VIEWS; LUMBAR SPINE - COMPLETE 4+ VIEW COMPARISON:  CT 09/30/2020 FINDINGS: Diffuse bridging osteophytes/syndesmophytes in the thoracic spine as confirmed by CT. In the lumbar spine there is intermittent intervertebral ankylosis from bulky osteophyte. Prominent degenerative facet spurring inferiorly in the lumbar spine. No visible acute fracture or subluxation. Maintained posterior mediastinal fat planes. Generalized osteopenia. IMPRESSION: 1. No acute finding in the thoracic or lumbar spine. 2. Diffuse thoracic ankylosis. Electronically Signed   By: Jorje Guild M.D.   On: 10/06/2020 04:07   DG Lumbar Spine Complete  Result Date: 10/06/2020 CLINICAL DATA:  Back pain related to fall EXAM: THORACIC SPINE 2 VIEWS; LUMBAR SPINE - COMPLETE 4+ VIEW COMPARISON:  CT 09/30/2020 FINDINGS: Diffuse bridging osteophytes/syndesmophytes in the  thoracic spine as confirmed by CT. In the lumbar spine there is intermittent intervertebral ankylosis from bulky osteophyte. Prominent degenerative facet spurring inferiorly in the lumbar spine. No visible acute fracture or subluxation. Maintained posterior mediastinal fat planes. Generalized osteopenia. IMPRESSION: 1. No acute finding in the thoracic or lumbar spine. 2. Diffuse thoracic ankylosis. Electronically Signed   By: Jorje Guild M.D.   On: 10/06/2020 04:07   DG Lumbar Spine Complete  Result Date: 09/29/2020 CLINICAL DATA:  [place], back pain EXAM: LUMBAR SPINE - COMPLETE 4+ VIEW COMPARISON:  None. FINDINGS: Frontal, bilateral oblique, lateral views of the lumbar spine are obtained. There are 5 non-rib-bearing lumbar type vertebral bodies in normal anatomic alignment. No acute displaced fractures. There is prominent spondylosis at the thoracolumbar junction with bridging anterior osteophytes. Significant facet hypertrophy is seen within the lower lumbar spine greatest at L3-4, L4-5, and L5-S1. Sacroiliac joints are unremarkable. Visualized portions of the bony pelvis are normal. IMPRESSION: 1. Multilevel thoracolumbar spondylosis and facet hypertrophy. No acute bony abnormality. Electronically Signed   By: Randa Ngo M.D.   On: 09/29/2020 17:21   DG Pelvis 1-2 Views  Result Date: 09/29/2020 CLINICAL DATA:  Fall EXAM: PELVIS - 1-2 VIEW COMPARISON:  09/10/2020 FINDINGS: SI joints show mild degenerative change but are non widened. Pubic symphysis and rami appear intact. No definitive fracture or malalignment. An oblique lucency over the left femoral neck appears to represent a skin fold artifact. Moderate bilateral hip arthritis IMPRESSION: 1. No definite acute osseous abnormality. A lucency over the left femoral neck is suspected to be due to fold artifact 2. Cross-sectional imaging should be pursued if high clinical suspicion for hip fracture Electronically Signed   By: Donavan Foil M.D.   On:  09/29/2020 17:37   DG Elbow Complete Left  Result Date: 09/30/2020 CLINICAL DATA:  Fall in a 87 year old male with bilateral skin tears to the elbows. EXAM: LEFT ELBOW - COMPLETE 3+ VIEW COMPARISON:  Contralateral elbow FINDINGS: Small well corticated bone fragments adjacent to the lateral epicondyle. No acute fracture or dislocation. No substantial soft tissue swelling or sign of joint effusion. IMPRESSION: No acute fracture or traumatic malalignment. Small well corticated bone fragments adjacent to the lateral epicondyle may reflect remote injury or sequela of prior epicondylitis. Electronically Signed   By: Zetta Bills M.D.   On: 09/30/2020 12:08   DG Elbow Complete Right  Result Date: 09/30/2020 CLINICAL DATA:  Right elbow pain and skin tears following a fall. EXAM: RIGHT ELBOW - COMPLETE 3+ VIEW COMPARISON:  09/29/2020 FINDINGS: Decreased posterior elbow swelling. No fracture, dislocation or effusion. IMPRESSION: No fracture or dislocation. Electronically Signed   By: Claudie Revering M.D.   On: 09/30/2020 12:12   DG Elbow Complete Right  Result Date: 09/29/2020 CLINICAL DATA:  Fall with laceration EXAM: RIGHT ELBOW - COMPLETE 3+ VIEW COMPARISON:  None. FINDINGS: There is no evidence of fracture, or dislocation. Slightly limited lateral view due to nonstandard positioning. Posterior elbow swelling. No gross effusion IMPRESSION: No definite acute osseous abnormality Electronically Signed   By: Donavan Foil M.D.   On: 09/29/2020 17:21   CT Head Wo Contrast  Result Date: 10/06/2020 CLINICAL DATA:  Head trauma EXAM: CT HEAD WITHOUT CONTRAST CT CERVICAL SPINE WITHOUT CONTRAST TECHNIQUE: Multidetector CT imaging of the head and cervical spine was performed following the standard protocol without intravenous contrast. Multiplanar CT image reconstructions of the cervical spine were also generated. COMPARISON:  None. FINDINGS: CT HEAD FINDINGS Brain: There is no mass, hemorrhage or extra-axial  collection. The size and configuration of the ventricles and extra-axial CSF spaces are normal. The brain parenchyma is normal, without evidence of acute or chronic infarction. Vascular: No abnormal hyperdensity of the major intracranial arteries or dural venous sinuses. No intracranial atherosclerosis. Skull: The visualized skull base, calvarium and extracranial soft tissues are normal. Sinuses/Orbits: No fluid levels or advanced mucosal thickening of the visualized paranasal sinuses. No mastoid or middle  ear effusion. The orbits are normal. CT CERVICAL SPINE FINDINGS Alignment: No static subluxation. Facets are aligned. Occipital condyles are normally positioned. Skull base and vertebrae: No acute fracture. Soft tissues and spinal canal: No prevertebral fluid or swelling. No visible canal hematoma. Disc levels: Multilevel facet arthrosis. Upper chest: Right pleural effusion Other: Normal visualized paraspinal cervical soft tissues. IMPRESSION: 1. No acute intracranial abnormality. 2. No acute fracture or static subluxation of the cervical spine. 3. Right pleural effusion. Electronically Signed   By: Ulyses Jarred M.D.   On: 10/06/2020 03:48   CT HEAD WO CONTRAST  Result Date: 09/30/2020 CLINICAL DATA:  Head trauma. EXAM: CT HEAD WITHOUT CONTRAST TECHNIQUE: Contiguous axial images were obtained from the base of the skull through the vertex without intravenous contrast. COMPARISON:  September 29, 2020 FINDINGS: Brain: No evidence of acute infarction, hemorrhage, hydrocephalus, extra-axial collection or mass lesion/mass effect. Moderate brain parenchymal volume loss and deep white matter microangiopathy. Vascular: No hyperdense vessel or unexpected calcification. Skull: Normal. Negative for fracture or focal lesion. Sinuses/Orbits: No acute finding. Other: None. IMPRESSION: 1. No acute intracranial abnormality. 2. Moderate brain parenchymal atrophy and chronic microvascular disease. Electronically Signed   By:  Fidela Salisbury M.D.   On: 09/30/2020 14:21   CT HEAD WO CONTRAST (5MM)  Result Date: 09/29/2020 CLINICAL DATA:  A 87 year old male patient found on the floor, found down. EXAM: CT HEAD WITHOUT CONTRAST CT CERVICAL SPINE WITHOUT CONTRAST TECHNIQUE: Multidetector CT imaging of the head and cervical spine was performed following the standard protocol without intravenous contrast. Multiplanar CT image reconstructions of the cervical spine were also generated. COMPARISON:  Jun 15, 2015. FINDINGS: CT HEAD FINDINGS Brain: No evidence of acute infarction, hemorrhage, hydrocephalus, extra-axial collection or mass lesion/mass effect. Signs of marked atrophy and chronic microvascular ischemic change with worsening of atrophy since previous imaging. Vascular: No hyperdense vessel or unexpected calcification. Skull: Normal. Negative for fracture or focal lesion. Sinuses/Orbits: No acute finding. Other: None CT CERVICAL SPINE FINDINGS Alignment: Straightening of normal cervical lordotic curvature in the setting of marked spinal degenerative change. Skull base and vertebrae: No acute fracture. No primary bone lesion or focal pathologic process. Soft tissues and spinal canal: No prevertebral fluid or swelling. No visible canal hematoma. Disc levels: Multilevel degenerative changes with marked atlantoaxial degenerative changes. Marked multilevel facet arthropathy on the RIGHT at C2-3, C3-4, C4-5 and to a lesser extent C5-6. Marked disc space narrowing at C5-6 with anterior osteophytes and uncovertebral spurring. Facet arthropathy on the LEFT at this level and the level below. Upper chest: Negative. Other: None IMPRESSION: No acute intracranial abnormality. Signs of marked atrophy and chronic microvascular ischemic change with worsening of atrophy since previous imaging. No evidence for acute fracture or static subluxation of the cervical spine. Marked multilevel degenerative changes and facet arthropathy of the cervical  spine. Electronically Signed   By: Zetta Bills M.D.   On: 09/29/2020 16:20   CT CHEST WO CONTRAST  Result Date: 09/30/2020 CLINICAL DATA:  Fall EXAM: CT CHEST WITHOUT CONTRAST TECHNIQUE: Multidetector CT imaging of the chest was performed following the standard protocol without IV contrast. COMPARISON:  Prior CT scan of the chest 04/30/2015 FINDINGS: Cardiovascular: Limited evaluation in the absence of intravenous contrast. No evidence of aortic aneurysm. Scattered atherosclerotic plaque throughout the abdominal aorta. Mild cardiomegaly. Extensive coronary artery atherosclerotic calcifications. Surgical changes of prior aortic valve replacement. No pericardial effusion. Mediastinum/Nodes: Unremarkable CT appearance of the thyroid gland. No suspicious mediastinal or hilar adenopathy. No soft  tissue mediastinal mass. The thoracic esophagus is unremarkable. Lungs/Pleura: Small left and moderate right layering pleural effusions. Diffuse chronic bronchial wall thickening. Atelectasis present in both lower lobes. Mild interlobular septal thickening in the apices and bases. No suspicious pulmonary mass or nodule. Upper Abdomen: No acute abnormality. Musculoskeletal: No chest wall mass or suspicious bone lesions identified. Mild dextroconvex scoliosis. IMPRESSION: 1. No evidence of acute injury to the thorax. 2. Cardiomegaly with extensive coronary artery calcifications and surgical changes of prior aortic valve replacement. 3. Moderate right and small left layering pleural effusions. Combined with trace interstitial pulmonary edema, findings suggest mild or chronic CHF. 4. Diffuse chronic bronchial wall thickening bilaterally. Aortic Atherosclerosis (ICD10-I70.0). Electronically Signed   By: Jacqulynn Cadet M.D.   On: 09/30/2020 14:31   CT Cervical Spine Wo Contrast  Result Date: 10/06/2020 CLINICAL DATA:  Head trauma EXAM: CT HEAD WITHOUT CONTRAST CT CERVICAL SPINE WITHOUT CONTRAST TECHNIQUE: Multidetector CT  imaging of the head and cervical spine was performed following the standard protocol without intravenous contrast. Multiplanar CT image reconstructions of the cervical spine were also generated. COMPARISON:  None. FINDINGS: CT HEAD FINDINGS Brain: There is no mass, hemorrhage or extra-axial collection. The size and configuration of the ventricles and extra-axial CSF spaces are normal. The brain parenchyma is normal, without evidence of acute or chronic infarction. Vascular: No abnormal hyperdensity of the major intracranial arteries or dural venous sinuses. No intracranial atherosclerosis. Skull: The visualized skull base, calvarium and extracranial soft tissues are normal. Sinuses/Orbits: No fluid levels or advanced mucosal thickening of the visualized paranasal sinuses. No mastoid or middle ear effusion. The orbits are normal. CT CERVICAL SPINE FINDINGS Alignment: No static subluxation. Facets are aligned. Occipital condyles are normally positioned. Skull base and vertebrae: No acute fracture. Soft tissues and spinal canal: No prevertebral fluid or swelling. No visible canal hematoma. Disc levels: Multilevel facet arthrosis. Upper chest: Right pleural effusion Other: Normal visualized paraspinal cervical soft tissues. IMPRESSION: 1. No acute intracranial abnormality. 2. No acute fracture or static subluxation of the cervical spine. 3. Right pleural effusion. Electronically Signed   By: Ulyses Jarred M.D.   On: 10/06/2020 03:48   CT CERVICAL SPINE WO CONTRAST  Result Date: 09/30/2020 CLINICAL DATA:  Post fall.  Neck trauma. EXAM: CT CERVICAL SPINE WITHOUT CONTRAST TECHNIQUE: Multidetector CT imaging of the cervical spine was performed without intravenous contrast. Multiplanar CT image reconstructions were also generated. COMPARISON:  September 29, 2020 FINDINGS: Alignment: Normal. Skull base and vertebrae: No acute fracture. No primary bone lesion or focal pathologic process. Soft tissues and spinal canal: No  prevertebral fluid or swelling. No visible canal hematoma. Disc levels:  Multilevel osteoarthritic changes. Upper chest: Negative. Other: None. IMPRESSION: 1. No evidence of acute traumatic injury to the cervical spine. 2. Multilevel osteoarthritic changes of the cervical spine. Electronically Signed   By: Fidela Salisbury M.D.   On: 09/30/2020 14:26   CT Cervical Spine Wo Contrast  Result Date: 09/29/2020 CLINICAL DATA:  A 87 year old male patient found on the floor, found down. EXAM: CT HEAD WITHOUT CONTRAST CT CERVICAL SPINE WITHOUT CONTRAST TECHNIQUE: Multidetector CT imaging of the head and cervical spine was performed following the standard protocol without intravenous contrast. Multiplanar CT image reconstructions of the cervical spine were also generated. COMPARISON:  Jun 15, 2015. FINDINGS: CT HEAD FINDINGS Brain: No evidence of acute infarction, hemorrhage, hydrocephalus, extra-axial collection or mass lesion/mass effect. Signs of marked atrophy and chronic microvascular ischemic change with worsening of atrophy since previous imaging.  Vascular: No hyperdense vessel or unexpected calcification. Skull: Normal. Negative for fracture or focal lesion. Sinuses/Orbits: No acute finding. Other: None CT CERVICAL SPINE FINDINGS Alignment: Straightening of normal cervical lordotic curvature in the setting of marked spinal degenerative change. Skull base and vertebrae: No acute fracture. No primary bone lesion or focal pathologic process. Soft tissues and spinal canal: No prevertebral fluid or swelling. No visible canal hematoma. Disc levels: Multilevel degenerative changes with marked atlantoaxial degenerative changes. Marked multilevel facet arthropathy on the RIGHT at C2-3, C3-4, C4-5 and to a lesser extent C5-6. Marked disc space narrowing at C5-6 with anterior osteophytes and uncovertebral spurring. Facet arthropathy on the LEFT at this level and the level below. Upper chest: Negative. Other: None  IMPRESSION: No acute intracranial abnormality. Signs of marked atrophy and chronic microvascular ischemic change with worsening of atrophy since previous imaging. No evidence for acute fracture or static subluxation of the cervical spine. Marked multilevel degenerative changes and facet arthropathy of the cervical spine. Electronically Signed   By: Zetta Bills M.D.   On: 09/29/2020 16:20   MR Brain Wo Contrast (neuro protocol)  Result Date: 10/06/2020 CLINICAL DATA:  Altered mental status EXAM: MRI HEAD WITHOUT CONTRAST TECHNIQUE: Multiplanar, multiecho pulse sequences of the brain and surrounding structures were obtained without intravenous contrast. COMPARISON:  Same-day noncontrast CT head FINDINGS: Brain: There is faint increased DWI signal in the right centrum semiovale without associated ADC hypointensity, consistent with T2 shine through of a nonspecific FLAIR hyperintense lesion. There is no evidence of acute intracranial hemorrhage, extra-axial fluid collection, or acute infarct. There is a small area of encephalomalacia in the left superior frontal gyrus near the vertex consistent with a small remote infarct. There is moderate to severe global parenchymal volume loss with ex vacuo dilatation of the ventricular system and right worse than left hippocampal atrophy. There is a relatively mild burden of chronic white matter microangiopathy. There is no mass lesion. There is no midline shift. Vascular: Normal flow voids. Skull and upper cervical spine: Marrow signal is normal. The cervical spine is assessed on the separately dictated cervical spine MRI. Sinuses/Orbits: The paranasal sinuses are clear. Bilateral lens implants are in place. The globes and orbits are otherwise unremarkable. Other: None. IMPRESSION: 1. No acute intracranial pathology. 2. Moderate to severe global parenchymal volume loss with right worse than left hippocampal atrophy with a mild burden of chronic white matter microangiopathy.  3. Small remote infarct in the left frontal lobe. Electronically Signed   By: Valetta Mole M.D.   On: 10/06/2020 13:18   MR Cervical Spine Wo Contrast  Result Date: 10/06/2020 CLINICAL DATA:  Neck trauma EXAM: MRI CERVICAL SPINE WITHOUT CONTRAST TECHNIQUE: Multiplanar, multisequence MR imaging of the cervical spine was performed. No intravenous contrast was administered. COMPARISON:  Same-day CT cervical spine FINDINGS: Image quality is degraded by motion artifact. Alignment: There is straightening of the normal cervical spine lordosis. There is no antero or retrolisthesis. There is no evidence of traumatic malalignment. There is no jumped or perched facet. Vertebrae: Vertebral body heights are preserved. There is no evidence of acute fracture. There is no suspicious osseous lesion. There is mild fluid signal about the dens, left atlantoaxial articulation, and bilateral atlantooccipital articulations. The tectorial membrane is intact. The anterior and posterior longitudinal ligaments are intact. The ligamentum flavum is intact. Cord: There is no definite cord signal abnormality. Posterior Fossa, vertebral arteries, paraspinal tissues: The brain and posterior fossa are assessed on the separately dictated brain MRI. The  paraspinal soft tissues are unremarkable. The vertebral artery flow voids are present. Disc levels: There is multilevel intervertebral disc desiccation and narrowing, most advanced at C5-C6. There is multilevel facet arthropathy with trace effusions at C2-C3. C2-C3: There is a prominent posterior disc osteophyte complex, ligamentum flavum thickening, and uncovertebral and facet arthropathy resulting in moderate spinal canal stenosis with effacement of the thecal sac and mild flattening of the ventral cord2 and severe left and mild right neural foraminal stenosis. C3-C4: There is mild uncovertebral and facet arthropathy resulting in moderate left and mild right neural foraminal stenosis without  significant spinal canal stenosis. C4-C5: There is a posterior disc osteophyte complex and uncovertebral and facet arthropathy resulting in severe bilateral neural foraminal stenosis and mild spinal canal stenosis. C5-C6: There is a prominent posterior disc osteophyte complex, ligamentum flavum thickening, and uncovertebral and facet arthropathy resulting in moderate to severe spinal canal stenosis with effacement of the thecal sac without evidence of cord compression and moderate to severe bilateral neural foraminal stenosis. C6-C7: There is uncovertebral and facet arthropathy resulting in severe left and mild right neural foraminal stenosis without significant spinal canal stenosis. C7-T1: No high-grade spinal canal or neural foraminal stenosis. IMPRESSION: 1. Mild fluid signal about the dens and atlantooccipital and atlantoaxial articulations as described above is favored to be degenerative in nature. There is no evidence of ligamentous injury. 2. No evidence of fracture or traumatic malalignment of the cervical spine. 3. Multilevel degenerative changes as above, most advanced at C2-C3 where there is moderate spinal canal stenosis and severe left neural foraminal stenosis and at C5-C6 where there is moderate to severe spinal canal stenosis and moderate to severe bilateral neural foraminal stenosis. 4. Varying degrees of spinal canal and neural foraminal stenosis at the remaining levels as detailed above. 5. Multilevel facet arthropathy with trace effusions at C2-C3, likely degenerative in nature. Electronically Signed   By: Valetta Mole M.D.   On: 10/06/2020 13:27   MR LUMBAR SPINE WO CONTRAST  Result Date: 10/06/2020 CLINICAL DATA:  Low back pain, trauma EXAM: MRI LUMBAR SPINE WITHOUT CONTRAST TECHNIQUE: Multiplanar, multisequence MR imaging of the lumbar spine was performed. No intravenous contrast was administered. COMPARISON:  Same-day lumbar spine radiographs, CT lumbar spine 09/30/2020 FINDINGS:  Segmentation:  Standard. Alignment:  Normal. Vertebrae: Mild diffuse T1 hypointensity is nonspecific. There is no focal or suspicious marrow signal abnormality. There is no evidence of fracture. Conus medullaris and cauda equina: Conus extends to the T12-L1 level. Conus and cauda equina appear normal. Paraspinal and other soft tissues: The paraspinal soft tissues are unremarkable. T2 hyperintense lesions in the kidneys likely reflect cysts. There is mild perinephric fluid/stranding, nonspecific. Disc levels: There is mild multilevel intervertebral disc desiccation without significant loss of height. There is multilevel facet arthropathy, most advanced at L4-L5 and L5-S1. There are trace associated facet joint effusions. T12-L1: No significant spinal canal or neural foraminal stenosis. L1-L2: No significant spinal canal or neural foraminal stenosis. L2-L3: There is a mild disc bulge and mild bilateral facet arthropathy resulting in mild bilateral neural foraminal stenosis without significant spinal canal stenosis. L3-L4: There is a mild disc bulge and mild bilateral facet arthropathy resulting in mild-to-moderate right worse than left neural foraminal stenosis without significant spinal canal stenosis. L4-L5: There is a mild disc bulge, ligamentum flavum thickening, and bilateral facet arthropathy resulting in crowding of the subarticular zones without evidence of nerve root impingement or significant spinal canal stenosis and moderate bilateral neural foraminal stenosis. L5-S1: There is bilateral  facet arthropathy resulting in mild left and no significant right neural foraminal stenosis without significant spinal canal stenosis. IMPRESSION: 1. No acute fracture or traumatic malalignment of the lumbar spine. 2. Mild multilevel degenerative changes as above, most advanced at L4-L5 and L5-S1, with up to moderate bilateral neural foraminal stenosis at L4-L5. No high-grade spinal canal or neural foraminal stenosis at the  remaining levels. 3. Diffuse T1 hypointense marrow signal is nonspecific and can be seen in the setting of anemia, smoking, among other etiologies. Correlate with CBC as indicated. Electronically Signed   By: Valetta Mole M.D.   On: 10/06/2020 13:34   CT ABDOMEN PELVIS W CONTRAST  Result Date: 09/30/2020 CLINICAL DATA:  Fall EXAM: CT ABDOMEN AND PELVIS WITH CONTRAST TECHNIQUE: Multidetector CT imaging of the abdomen and pelvis was performed using the standard protocol following bolus administration of intravenous contrast. CONTRAST:  75mL OMNIPAQUE IOHEXOL 350 MG/ML SOLN COMPARISON:  None. FINDINGS: Lower chest: See concurrently obtained but separately dictated CT scan of the chest. Hepatobiliary: No focal liver abnormality is seen. No gallstones, gallbladder wall thickening, or biliary dilatation. Pancreas: Unremarkable. No pancreatic ductal dilatation or surrounding inflammatory changes. Spleen: Normal in size without focal abnormality. Adrenals/Urinary Tract: Unremarkable adrenal glands. Renal cortical atrophy bilaterally. Nonobstructing bilateral nephrolithiasis. No enhancing renal mass. No ureteral abnormalities. The bladder is within normal limits. Stomach/Bowel: Scattered colonic diverticula without evidence of active inflammation. Normal appendix. No focal bowel wall thickening or evidence of obstruction. Vascular/Lymphatic: Atherosclerotic calcifications throughout the abdominal aorta without evidence of aneurysm. No suspicious lymphadenopathy. Reproductive: Prostate is unremarkable. Other: No abdominal wall hernia or abnormality. No abdominopelvic ascites. Musculoskeletal: No acute or significant osseous findings. Bilateral lower lumbar facet arthropathy. IMPRESSION: 1. No acute abnormality or evidence of injury in the abdomen or pelvis. 2. Bilateral nonobstructing nephrolithiasis. 3.  Aortic Atherosclerosis (ICD10-I70.0). 4. Lower lumbar facet arthropathy. 5. Scattered colonic diverticula without  evidence of active inflammation. Electronically Signed   By: Jacqulynn Cadet M.D.   On: 09/30/2020 14:25   CT L-SPINE NO CHARGE  Result Date: 09/30/2020 CLINICAL DATA:  Fall W19.XXXA (ICD-10-CM) EXAM: CT LUMBAR SPINE WITHOUT CONTRAST TECHNIQUE: Multidetector CT imaging of the lumbar spine was performed without intravenous contrast administration. Multiplanar CT image reconstructions were also generated. COMPARISON:  None. FINDINGS: Segmentation: 5 non rib-bearing lumbar vertebral bodies. Alignment: No substantial sagittal subluxation. Mild broad levocurvature. Vertebrae: Vertebral body heights are maintained. No evidence of acute fracture. Diffuse osteopenia. Paraspinal and other soft tissues: Please see concurrent CT abdomen/pelvis for intra-abdominal evaluation. Disc levels: Broad disc bulge at L4-L5 with at least mild canal stenosis. Severe lower lumbar facet arthropathy. Probable foraminal stenosis on the left at L4-L5 and L5-S1. IMPRESSION: 1. No evidence of acute fracture or traumatic malalignment. 2. Severe lower lumbar facet arthropathy with likely at least mild canal stenosis at L4-L5 and probable left foraminal stenosis at L4-L5 and L5-S1. An MRI of the lumbar spine could better characterize the canal and foramina if clinically indicated. 3. Osteopenia. Electronically Signed   By: Margaretha Sheffield M.D.   On: 09/30/2020 13:58   DG Chest Portable 1 View  Result Date: 10/05/2020 CLINICAL DATA:  Fall 1 week ago.  Weakness. EXAM: PORTABLE CHEST 1 VIEW COMPARISON:  09/30/2020 FINDINGS: Postop median sternotomy and aortic valve replacement. CABG changes. Mild perihilar airspace disease has improved consistent with clearing edema. Mild left lower lobe atelectasis and small left effusion. IMPRESSION: Interval improvement in congestive heart failure and edema. Electronically Signed   By: Franchot Gallo M.D.  On: 10/05/2020 14:25   DG Chest Port 1 View  Result Date: 09/30/2020 CLINICAL DATA:   Unwitnessed fall. EXAM: PORTABLE CHEST 1 VIEW COMPARISON:  09/29/2020 FINDINGS: Stable mildly enlarged cardiac silhouette, sternal wires and prosthetic aortic valve. Interval patchy opacity in both lungs, most pronounced in the lower lung zones. No visible pleural fluid. No fracture or pneumothorax seen. Thoracic spine degenerative changes. IMPRESSION: Interval patchy bilateral atelectasis or pneumonia. Pulmonary contusions are also a possibility. Electronically Signed   By: Claudie Revering M.D.   On: 09/30/2020 12:09   DG Abd Portable 1V  Result Date: 10/06/2020 CLINICAL DATA:  Pre MRI study. EXAM: PORTABLE ABDOMEN - 1 VIEW COMPARISON:  None. FINDINGS: Significant contrast is seen throughout the length of the colon. No metallic foreign bodies. No other acute abnormalities. IMPRESSION: Contrast seen throughout the length of the colon. No metallic foreign bodies. Electronically Signed   By: Dorise Bullion III M.D.   On: 10/06/2020 10:50   DG Swallowing Func-Speech Pathology  Result Date: 10/01/2020 Table formatting from the original result was not included. Objective Swallowing Evaluation: Type of Study: MBS-Modified Barium Swallow Study  Patient Details Name: DEO MEHRINGER MRN: [id number] Date of Birth: 1926/03/10 Today's Date: 10/01/2020 Time: SLP Start Time (ACUTE ONLY): 9485 -SLP Stop Time (ACUTE ONLY): 4627 SLP Time Calculation (min) (ACUTE ONLY): 20 min Past Medical History: Past Medical History: Diagnosis Date  BPH (benign prostatic hyperplasia)   Coronary artery disease with hx of myocardial infarct w/o hx of CABG 2010  Dehydration   Diverticulosis   HOH (hard of hearing)   hearing aids  Hyperlipidemia   Hypertension   Impaired fasting glucose   Memory difficulty 10/19/2012  Memory loss   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 male with PMH: dementia, CAD s/p CABG in 2010, AS s/p AVR, HTN and BPH returning to ED after recurrent fall. In ED, vitals WNL except slightly elevated BP, CXR revealed atelectasis/possible pneumonia/possible contusion, CT head and spine without acute finding, CTA chest showed cardiomegaly, moderate right and small left pleural effusion.  Subjective: pleasant, cooperative Assessment / Plan / Recommendation CHL IP CLINICAL IMPRESSIONS 10/01/2020 Clinical Impression Patient presents with a mild-moderate oropharyngeal dysphagia with a suspected cervical esophageal component as well. Patient exhibited oral delays with all tested boluses and when taking straw sips of thin liquids or nectar thick liquids, would appear to fill up oral cavity and hold liquid until fairly full prior to swallow. Premature spillage from oral cavity into vallecular sinus was observed with thin liquids. Patient exhibited piecemeal swallowing with puree solids. During pharyngeal phase, patient exhibited trace penetration (PAS 3) during the swallow with thin liquids via straw sips but no aspiration. He did exhibit aspiration of trace amount (PAS 8) during and after the swallow when consuming thin liquids via cup sips. SLP suspects patients awareness to liquids during cup sips was poor leading to aspiration, however improved when using straw sips. No penetration or aspiration observed with straw sips of nectar thick liquids. Puree solids resulted in trace to mild vallecular and pyriform sinus residuals as well as reduced pharyngeal transit. Trace residuals observed in pharynx in vallecular and pyriform sinuses with  thin liquids and nectar thick liquids. Patient exhibited reduced UES relaxation with retention of barium just above UES observed. In addition, SLP observed presence of likely cervical osteophyte (no radiologist present to confirm). SLP  is recommending Dys 1, nectar thick liquids but allow thin liquids in between meals with straw only (no cup sips). SLP Visit Diagnosis -- Attention and concentration deficit following -- Frontal lobe and executive function deficit following -- Impact on safety and function --   CHL IP TREATMENT RECOMMENDATION 10/01/2020 Treatment Recommendations Therapy as outlined in treatment plan below   Prognosis 10/01/2020 Prognosis for Safe Diet Advancement Fair Barriers to Reach Goals Cognitive deficits Barriers/Prognosis Comment patient with h/o dementia CHL IP DIET RECOMMENDATION 10/01/2020 SLP Diet Recommendations Dysphagia 1 (Puree) solids;Nectar thick liquid;Other (Comment) Liquid Administration via Straw;Other (Comment) Medication Administration Crushed with puree Compensations Minimize environmental distractions;Small sips/bites;Slow rate Postural Changes --   CHL IP OTHER RECOMMENDATIONS 10/01/2020 Recommended Consults -- Oral Care Recommendations Oral care BID;Staff/trained caregiver to provide oral care Other Recommendations Order thickener from pharmacy;Prohibited food (jello, ice cream, thin soups);Remove water pitcher;Clarify dietary restrictions   CHL IP FOLLOW UP RECOMMENDATIONS 10/01/2020 Follow up Recommendations 24 hour supervision/assistance;Skilled Nursing facility   [facility] IP FREQUENCY AND DURATION 10/01/2020 Speech Therapy Frequency (ACUTE ONLY) min 1 x/week Treatment Duration 1 week      CHL IP ORAL PHASE 10/01/2020 Oral Phase Impaired Oral - Pudding Teaspoon -- Oral - Pudding Cup -- Oral - Honey Teaspoon -- Oral - Honey Cup -- Oral - Nectar Teaspoon -- Oral - Nectar Cup -- Oral - Nectar Straw Weak lingual manipulation;Delayed oral transit;Piecemeal swallowing;Reduced posterior propulsion Oral - Thin Teaspoon -- Oral - Thin Cup Weak lingual manipulation;Holding of bolus;Piecemeal swallowing;Delayed oral transit;Reduced posterior propulsion;Premature spillage Oral - Thin Straw Reduced posterior propulsion;Delayed  oral transit;Weak lingual manipulation;Holding of bolus;Premature spillage Oral - Puree Weak lingual manipulation;Reduced posterior propulsion;Delayed oral transit;Piecemeal swallowing Oral - Mech Soft -- Oral - Regular -- Oral - Multi-Consistency -- Oral - Pill Weak lingual manipulation;Reduced posterior propulsion;Delayed oral transit Oral Phase - Comment --  CHL IP PHARYNGEAL PHASE 10/01/2020 Pharyngeal Phase Impaired Pharyngeal- Pudding Teaspoon -- Pharyngeal -- Pharyngeal- Pudding Cup -- Pharyngeal -- Pharyngeal- Honey Teaspoon -- Pharyngeal -- Pharyngeal- Honey Cup -- Pharyngeal -- Pharyngeal- Nectar Teaspoon -- Pharyngeal -- Pharyngeal- Nectar Cup -- Pharyngeal -- Pharyngeal- Nectar Straw Pharyngeal residue - valleculae;Pharyngeal residue - pyriform;Reduced anterior laryngeal mobility Pharyngeal -- Pharyngeal- Thin Teaspoon -- Pharyngeal -- Pharyngeal- Thin Cup Reduced anterior laryngeal mobility;Penetration/Aspiration during swallow;Penetration/Apiration after swallow;Trace aspiration;Pharyngeal residue - valleculae;Pharyngeal residue - pyriform Pharyngeal Material enters airway, passes BELOW cords without attempt by patient to eject out (silent aspiration);Material enters airway, CONTACTS cords and not ejected out Pharyngeal- Thin Straw Delayed swallow initiation-vallecula;Penetration/Aspiration during swallow;Pharyngeal residue - valleculae;Pharyngeal residue - pyriform;Reduced anterior laryngeal mobility;Reduced airway/laryngeal closure Pharyngeal Material enters airway, remains ABOVE vocal cords and not ejected out Pharyngeal- Puree Delayed swallow initiation-vallecula;Pharyngeal residue - pyriform;Pharyngeal residue - valleculae;Reduced anterior laryngeal mobility;Reduced pharyngeal peristalsis Pharyngeal -- Pharyngeal- Mechanical Soft -- Pharyngeal -- Pharyngeal- Regular -- Pharyngeal -- Pharyngeal- Multi-consistency -- Pharyngeal -- Pharyngeal- Pill Delayed swallow initiation-vallecula;Pharyngeal  residue - pyriform;Pharyngeal residue - valleculae;Reduced pharyngeal peristalsis Pharyngeal -- Pharyngeal Comment --  CHL IP CERVICAL ESOPHAGEAL PHASE 10/01/2020 Cervical Esophageal Phase Impaired Pudding Teaspoon -- Pudding Cup -- Honey Teaspoon -- Honey Cup -- Nectar Teaspoon -- Nectar Cup -- Nectar Straw Reduced cricopharyngeal relaxation Thin Teaspoon -- Thin Cup Reduced cricopharyngeal relaxation Thin Straw Reduced cricopharyngeal relaxation Puree Reduced cricopharyngeal relaxation Mechanical Soft -- Regular --  Multi-consistency -- Pill Reduced cricopharyngeal relaxation Cervical Esophageal Comment -- Sonia Baller, MA, CCC-SLP Speech Therapy             ECHOCARDIOGRAM COMPLETE  Result Date: 10/01/2020    ECHOCARDIOGRAM REPORT   Patient Name:   KEIN CARLBERG Date of Exam: 10/01/2020 Medical Rec #:  [id number]        Height:       72.0 in Accession #:    [id number]       Weight:       147.5 lb Date of Birth:  1926/07/25        BSA:          1.872 m Patient Age:    30 years         BP:           142/62 mmHg Patient Gender: M                HR:           69 bpm. Exam Location:  Inpatient Procedure: 2D Echo, 3D Echo, Color Doppler and Cardiac Doppler Indications:    CHF-Acute Diastolic J09.32  History:        Patient has prior history of Echocardiogram examinations, most                 recent 01/11/2020. CAD, Prior CABG; Risk Factors:Hypertension                 and Dyslipidemia.                 Aortic Valve: 23 mm Edwards pericardial valve is present in the                 aortic position. Procedure Date: 05/21/2010.  Sonographer:    Darlina Sicilian RDCS Referring Phys: [id number] Charlesetta Ivory GONFA IMPRESSIONS  1. Left ventricular ejection fraction, by estimation, is 60 to 65%. The left ventricle has normal function. The left ventricle has no regional wall motion abnormalities. Left ventricular diastolic parameters are consistent with Grade II diastolic dysfunction (pseudonormalization). Elevated left ventricular  end-diastolic pressure. The E/e' is 71.  2. Right ventricular systolic function is mildly reduced. The right ventricular size is normal.  3. Left atrial size was mildly dilated.  4. Right atrial size was mildly dilated.  5. The mitral valve is grossly normal. Mild mitral valve regurgitation.  6. The aortic valve has been repaired/replaced. Aortic valve regurgitation is not visualized. Mild aortic valve sclerosis is present, with no evidence of aortic valve stenosis. There is a 23 mm Edwards pericardial valve present in the aortic position. Procedure Date: 05/21/2010. Aortic valve mean gradient measures 11.0 mmHg. DI is 0.31. EROA is 1.31 cm2. Findings suggest mild prosthetic valve stenosis.  7. The inferior vena cava is normal in size with <50% respiratory variability, suggesting right atrial pressure of 8 mmHg. Comparison(s): 01/11/2020: LVEF 60-65%, bioprosthetic AVR - mean gradient 10 mmHg, DI 0.55. FINDINGS  Left Ventricle: Left ventricular ejection fraction, by estimation, is 60 to 65%. The left ventricle has normal function. The left ventricle has no regional wall motion abnormalities. The left ventricular internal cavity size was normal in size. There is  no left ventricular hypertrophy. Abnormal (paradoxical) septal motion consistent with post-operative status. Left ventricular diastolic parameters are consistent with Grade II diastolic dysfunction (pseudonormalization). Elevated left ventricular end-diastolic pressure. The E/e' is 13. Right Ventricle: The right ventricular size is normal. No increase in right ventricular wall thickness. Right  ventricular systolic function is mildly reduced. Left Atrium: Left atrial size was mildly dilated. Right Atrium: Right atrial size was mildly dilated. Pericardium: There is no evidence of pericardial effusion. Mitral Valve: The mitral valve is grossly normal. Mild to moderate mitral annular calcification. Mild mitral valve regurgitation. Tricuspid Valve: The tricuspid  valve is grossly normal. Tricuspid valve regurgitation is trivial. Aortic Valve: The aortic valve has been repaired/replaced. Aortic valve regurgitation is not visualized. Mild aortic valve sclerosis is present, with no evidence of aortic valve stenosis. Aortic valve mean gradient measures 11.0 mmHg. Aortic valve peak gradient measures 17.1 mmHg. Aortic valve area, by VTI measures 1.31 cm. There is a 23 mm Edwards pericardial valve present in the aortic position. Procedure Date: 05/21/2010. Pulmonic Valve: The pulmonic valve was grossly normal. Pulmonic valve regurgitation is trivial. Aorta: The aortic root and ascending aorta are structurally normal, with no evidence of dilitation. Venous: The inferior vena cava is normal in size with less than 50% respiratory variability, suggesting right atrial pressure of 8 mmHg. IAS/Shunts: No atrial level shunt detected by color flow Doppler.  LEFT VENTRICLE PLAX 2D LVIDd:         5.30 cm  Diastology LVIDs:         4.30 cm  LV e' medial:    3.77 cm/s LV PW:         0.90 cm  LV E/e' medial:  34.5 LV IVS:        0.90 cm  LV e' lateral:   6.25 cm/s LVOT diam:     2.30 cm  LV E/e' lateral: 20.8 LV SV:         64 LV SV Index:   34 LVOT Area:     4.15 cm  RIGHT VENTRICLE RV S prime:     9.81 cm/s TAPSE (M-mode): 1.9 cm LEFT ATRIUM             Index       RIGHT ATRIUM           Index LA diam:        4.90 cm 2.62 cm/m  RA Area:     22.30 cm LA Vol (A2C):   64.7 ml 34.56 ml/m RA Volume:   63.80 ml  34.08 ml/m LA Vol (A4C):   61.0 ml 32.56 ml/m LA Biplane Vol: 66.5 ml 35.53 ml/m  AORTIC VALVE                    PULMONIC VALVE AV Area (Vmax):    1.37 cm     PV Vmax:          2.17 m/s AV Area (Vmean):   1.25 cm     PV Peak grad:     18.8 mmHg AV Area (VTI):     1.31 cm     PR End Diast Vel: 7.95 msec AV Vmax:           207.00 cm/s AV Vmean:          156.000 cm/s AV VTI:            0.487 m AV Peak Grad:      17.1 mmHg AV Mean Grad:      11.0 mmHg LVOT Vmax:         68.50 cm/s  LVOT Vmean:        47.100 cm/s LVOT VTI:          0.153 m LVOT/AV VTI ratio: 0.31  AORTA Ao  Root diam: 2.80 cm Ao Asc diam:  3.30 cm MITRAL VALVE MV Area (PHT): 4.39 cm     SHUNTS MV Decel Time: 173 msec     Systemic VTI:  0.15 m MV E velocity: 130.00 cm/s  Systemic Diam: 2.30 cm MV A velocity: 53.30 cm/s MV E/A ratio:  2.44 Lyman Bishop MD Electronically signed by Lyman Bishop MD Signature Date/Time: 10/01/2020/3:22:44 PM    Final    CT Angio Chest/Abd/Pel for Dissection W and/or Wo Contrast  Result Date: 09/30/2020 CLINICAL DATA:  Abdominal pain. Elevated troponin. Clinical concern for aortic dissection. EXAM: CT ANGIOGRAPHY CHEST, ABDOMEN AND PELVIS TECHNIQUE: Non-contrast CT of the chest was initially obtained. Multidetector CT imaging through the chest, abdomen and pelvis was performed using the standard protocol during bolus administration of intravenous contrast. Multiplanar reconstructed images and MIPs were obtained and reviewed to evaluate the vascular anatomy. CONTRAST:  10mL OMNIPAQUE IOHEXOL 350 MG/ML SOLN COMPARISON:  Chest CT obtained without contrast earlier today and abdomen and pelvis CT obtained with contrast earlier today. FINDINGS: The images are limited by photon starvation and streak artifacts produced by the patient's arms not being raised above his head. CTA CHEST FINDINGS Cardiovascular: Median sternotomy wires, post CABG changes and prosthetic aortic valve. Aortic and coronary artery calcifications. No aortic aneurysm or dissection. Mildly enlarged heart with left ventricular and biatrial enlargement. Mediastinum/Nodes: No enlarged mediastinal, hilar, or axillary lymph nodes. Thyroid gland, trachea, and esophagus demonstrate no significant findings. Lungs/Pleura: Stable moderate right and small left pleural effusions, prominent pulmonary vasculature, mildly prominent interstitial markings, diffuse peribronchial thickening and mild bilateral dependent atelectasis. Musculoskeletal:  Thoracic and lower cervical spine degenerative changes, including changes of DISH. Review of the MIP images confirms the above findings. CTA ABDOMEN AND PELVIS FINDINGS VASCULAR Aorta: Extensive calcified and noncalcified mural plaque formation in the abdominal aorta, most pronounced in the infrarenal region. In that area, this is causing approximately 50% luminal narrowing. No aneurysm or acute dissection seen. Celiac: Calcified and noncalcified plaque at the origin producing greater than 90% stenosis. Normally opacified distally. SMA: Calcified plaque formation at the origin and proximally, producing approximately 50% stenosis. Renals: Dense calcified plaque formation at the origins causing approximately 90% or greater stenosis bilaterally. IMA: Noncalcified plaque at the origin producing approximately 90% stenosis. Normally opacified distally. Inflow: Bilateral common iliac calcified plaque formation causing about 50% luminal stenosis on the left and 20% luminal stenosis on the right. There is also mild calcified plaque formation involving the internal iliac arteries bilaterally. The external iliac arteries are unremarkable. There is some calcified plaque involving both common femoral arteries without significant luminal stenosis. Veins: No obvious venous abnormality within the limitations of this arterial phase study. Review of the MIP images confirms the above findings. NON-VASCULAR Hepatobiliary: No focal liver abnormality is seen. No gallstones, gallbladder wall thickening, or biliary dilatation. Pancreas: Unremarkable. No pancreatic ductal dilatation or surrounding inflammatory changes. Spleen: Mildly prominent without abnormal enlargement. Adrenals/Urinary Tract: Normal appearing adrenal glands. Small bilateral renal calculi. Excreted contrast in the urinary bladder. Unremarkable ureters. Stomach/Bowel: Multiple colonic diverticula without evidence of diverticulitis. Normal appearing appendix. Unremarkable  small bowel and stomach. Lymphatic: No enlarged lymph nodes. Reproductive: Moderately enlarged prostate gland. Other: Small to moderate-sized left inguinal hernia containing fat and small right inguinal hernia containing fat. Musculoskeletal: Lumbar spine degenerative changes. Review of the MIP images confirms the above findings. IMPRESSION: 1. No aortic aneurysm or dissection. 2. Extensive arterial atheromatous changes causing approximately 50% luminal stenosis of the infrarenal abdominal  aorta, greater than 90% stenosis of the proximal celiac axis, approximately 50% stenosis of the proximal SMA, approximately 90% or greater stenosis of the renal arteries bilaterally and approximately 90% stenosis of the proximal IMA. 3. Stable changes of congestive heart failure including bilateral pleural effusions, interstitial pulmonary edema, pulmonary vascular congestion and bronchitic changes. 4. Colonic diverticulosis. 5. Bilateral nonobstructing renal calculi. Electronically Signed   By: Claudie Revering M.D.   On: 09/30/2020 17:36   DG Hip Unilat W or Wo Pelvis 2-3 Views Left  Result Date: 09/29/2020 CLINICAL DATA:  Fall EXAM: DG HIP (WITH OR WITHOUT PELVIS) 2-3V LEFT COMPARISON:  09/10/2020 FINDINGS: Left SI joint is non widened. No definitive fracture or malalignment. Moderate hip arthritis. IMPRESSION: No definite acute osseous abnormality. Electronically Signed   By: Donavan Foil M.D.   On: 09/29/2020 19:04    Labs: BNP (last 3 results) Recent Labs    09/30/20 1141 10/05/20 1347  BNP 2,356.4* 3,016.0*   Basic Metabolic Panel: Recent Labs  Lab 10/05/20 1347 10/07/20 0510 10/08/20 0124  NA 142 140 140  K 4.6 3.8 3.9  CL 110 106 109  CO2 23 23 23   GLUCOSE 105* 107* 152*  BUN 28* 26* 32*  CREATININE 1.66* 1.45* 1.52*  CALCIUM 8.7* 8.8* 8.5*  MG 2.5*  --   --    Liver Function Tests: No results for input(s): AST, ALT, ALKPHOS, BILITOT, PROT, ALBUMIN in the last 168 hours. No results for  input(s): LIPASE, AMYLASE in the last 168 hours. Recent Labs  Lab 10/08/20 0124  AMMONIA 23   CBC: Recent Labs  Lab 10/05/20 1347 10/07/20 0510 10/08/20 0124  WBC 31.5* 35.0* 26.6*  NEUTROABS 27.4*  --   --   HGB 12.8* 13.6 12.3*  HCT 39.2 40.8 37.6*  MCV 91.8 88.9 90.0  PLT 1,004* 1,319* 1,315*   Cardiac Enzymes: Recent Labs  Lab 10/07/20 0510  CKTOTAL 49   BNP: Invalid input(s): POCBNP CBG: No results for input(s): GLUCAP in the last 168 hours. D-Dimer No results for input(s): DDIMER in the last 72 hours. Hgb A1c No results for input(s): HGBA1C in the last 72 hours. Lipid Profile No results for input(s): CHOL, HDL, LDLCALC, TRIG, CHOLHDL, LDLDIRECT in the last 72 hours. Thyroid function studies No results for input(s): TSH, T4TOTAL, T3FREE, THYROIDAB in the last 72 hours.  Invalid input(s): FREET3 Anemia work up No results for input(s): VITAMINB12, FOLATE, FERRITIN, TIBC, IRON, RETICCTPCT in the last 72 hours.  Urinalysis    Component Value Date/Time   COLORURINE YELLOW 10/06/2020 0203   APPEARANCEUR CLEAR 10/06/2020 0203   LABSPEC 1.013 10/06/2020 0203   PHURINE 7.0 10/06/2020 0203   GLUCOSEU NEGATIVE 10/06/2020 0203   HGBUR NEGATIVE 10/06/2020 0203   BILIRUBINUR NEGATIVE 10/06/2020 0203   KETONESUR 5 (A) 10/06/2020 0203   PROTEINUR 30 (A) 10/06/2020 0203   UROBILINOGEN 1.0 11/05/2013 2131   NITRITE NEGATIVE 10/06/2020 0203   LEUKOCYTESUR NEGATIVE 10/06/2020 0203   Sepsis Labs Invalid input(s): PROCALCITONIN,  WBC,  LACTICIDVEN Microbiology Recent Results (from the past 240 hour(s))  Resp Panel by RT-PCR (Flu A&B, Covid) Nasopharyngeal Swab     Status: None   Collection Time: 10/05/20  1:47 PM   Specimen: Nasopharyngeal Swab; Nasopharyngeal(NP) swabs in vial transport medium  Result Value Ref Range Status   SARS Coronavirus 2 by RT PCR NEGATIVE NEGATIVE Final    Comment: (NOTE) SARS-CoV-2 target nucleic acids are NOT DETECTED.  The SARS-CoV-2  RNA is generally detectable in upper  respiratory specimens during the acute phase of infection. The lowest concentration of SARS-CoV-2 viral copies this assay can detect is 138 copies/mL. A negative result does not preclude SARS-Cov-2 infection and should not be used as the sole basis for treatment or other patient management decisions. A negative result may occur with  improper specimen collection/handling, submission of specimen other than nasopharyngeal swab, presence of viral mutation(s) within the areas targeted by this assay, and inadequate number of viral copies(<138 copies/mL). A negative result must be combined with clinical observations, patient history, and epidemiological information. The expected result is Negative.  Fact Sheet for Patients:  EntrepreneurPulse.com.au  Fact Sheet for Healthcare Providers:  IncredibleEmployment.be  This test is no t yet approved or cleared by the [place] FDA and  has been authorized for detection and/or diagnosis of SARS-CoV-2 by FDA under an Emergency Use Authorization (EUA). This EUA will remain  in effect (meaning this test can be used) for the duration of the COVID-19 declaration under Section 564(b)(1) of the Act, 21 U.S.C.section 360bbb-3(b)(1), unless the authorization is terminated  or revoked sooner.       Influenza A by PCR NEGATIVE NEGATIVE Final   Influenza B by PCR NEGATIVE NEGATIVE Final    Comment: (NOTE) The Xpert Xpress SARS-CoV-2/FLU/RSV plus assay is intended as an aid in the diagnosis of influenza from Nasopharyngeal swab specimens and should not be used as a sole basis for treatment. Nasal washings and aspirates are unacceptable for Xpert Xpress SARS-CoV-2/FLU/RSV testing.  Fact Sheet for Patients: EntrepreneurPulse.com.au  Fact Sheet for Healthcare Providers: IncredibleEmployment.be  This test is not yet approved or cleared by the  [place] FDA and has been authorized for detection and/or diagnosis of SARS-CoV-2 by FDA under an Emergency Use Authorization (EUA). This EUA will remain in effect (meaning this test can be used) for the duration of the COVID-19 declaration under Section 564(b)(1) of the Act, 21 U.S.C. section 360bbb-3(b)(1), unless the authorization is terminated or revoked.  Performed at [facility], [place] [address] [place], [place] [address]   Urine Culture     Status: None   Collection Time: 10/06/20  1:05 AM   Specimen: In/Out Cath Urine  Result Value Ref Range Status   Specimen Description IN/OUT CATH URINE  Final   Special Requests NONE  Final   Culture   Final    NO GROWTH Performed at [facility], [place] [address] [place], [place] [address]    Report Status 10/07/2020 FINAL  Final     Time coordinating discharge: 25 minutes  SIGNED: Antonieta Pert, MD  [facility] 10/11/2020, 7:37 AM  If 7PM-7AM, please contact night-coverage [email or web address]

## 2020-10-10 NOTE — Progress Notes (Addendum)
Physical Therapy Treatment Patient Details Name: [name] MRN: [id number] DOB: 24-Mar-1926 Today's Date: 10/10/2020   History of Present Illness 87 year old admitted 9/16 with severe generalized weakness. Pt was hospitalized 9/11 >10/03/2020 after suffering a fall and subsequently being found to have suffered an NSTEMI with acute grade 2 diastolic CHF.  Following that hospital stay he was discharged back to his independent living facility, where it was questioned whether  he required a higher level of care. PMH:  HTN, CAD status post CABG 2010, AOS status post AVR, BPH, dementia    PT Comments    Pt progressing well towards his physical therapy goals, exhibiting improved activity tolerance and ambulation distance this session. Ambulating 100 feet with min-mod assist and chair follow. Pt presents as a high fall risk based on decreased gait speed, balance deficits, and decreased safety awareness. Continue to recommend SNF for ongoing Physical Therapy.      Recommendations for follow up therapy are one component of a multi-disciplinary discharge planning process, led by the attending physician.  Recommendations may be updated based on patient status, additional functional criteria and insurance authorization.  Follow Up Recommendations  Supervision/Assistance - 24 hour;SNF     Equipment Recommendations  None recommended by PT    Recommendations for Other Services       Precautions / Restrictions Precautions Precautions: Fall Restrictions Weight Bearing Restrictions: No     Mobility  Bed Mobility Overal bed mobility: Needs Assistance Bed Mobility: Supine to Sit     Supine to sit: Min assist     General bed mobility comments: Pt initiating well, minA to pull up to sitting position    Transfers Overall transfer level: Needs assistance Equipment used: Rolling walker (2 wheeled) Transfers: Sit to/from Stand Sit to Stand: Mod assist         General transfer comment:  ModA to boost up to standing position from edge of bed  Ambulation/Gait Ambulation/Gait assistance: Mod assist;+2 safety/equipment;Min assist Gait Distance (Feet): 100 Feet Assistive device: Rolling walker (2 wheeled) Gait Pattern/deviations: Step-through pattern;Decreased stride length;Leaning posteriorly;Decreased dorsiflexion - right;Decreased dorsiflexion - left;Narrow base of support;Drifts right/left Gait velocity: decreased   General Gait Details: pt demonstrating narrow BOS, decreased stride length, increased trunk flexion and one episode of scissoring resulting in lateral LOB. Pt requiring min-modA for balance overall and max multimodal cueing for larger steps, upright posture, wider BOS. Assist for maintaining walker on a straight path (tendency for R drift).   Stairs             Wheelchair Mobility    Modified Rankin (Stroke Patients Only)       Balance Overall balance assessment: Needs assistance;History of Falls Sitting-balance support: Feet supported Sitting balance-Leahy Scale: Fair     Standing balance support: No upper extremity supported;During functional activity Standing balance-Leahy Scale: Poor                              Cognition Arousal/Alertness: Awake/alert Behavior During Therapy: WFL for tasks assessed/performed Overall Cognitive Status: History of cognitive impairments - at baseline                                 General Comments: Pt pleasantly confused, talking to son on the phone [place]      Exercises      General Comments  Pertinent Vitals/Pain Pain Assessment: Faces Faces Pain Scale: No hurt    Home Living                      Prior Function            PT Goals (current goals can now be found in the care plan section) Acute Rehab PT Goals Patient Stated Goal: did not state PT Goal Formulation: With patient Time For Goal Achievement:  10/21/20 Potential to Achieve Goals: Good Progress towards PT goals: Progressing toward goals    Frequency    Min 2X/week      PT Plan Current plan remains appropriate    Co-evaluation              AM-PAC PT "6 Clicks" Mobility   Outcome Measure  Help needed turning from your back to your side while in a flat bed without using bedrails?: A Little Help needed moving from lying on your back to sitting on the side of a flat bed without using bedrails?: A Little Help needed moving to and from a bed to a chair (including a wheelchair)?: A Lot Help needed standing up from a chair using your arms (e.g., wheelchair or bedside chair)?: A Lot Help needed to walk in hospital room?: A Lot Help needed climbing 3-5 steps with a railing? : Total 6 Click Score: 13    End of Session Equipment Utilized During Treatment: Gait belt Activity Tolerance: Patient tolerated treatment well Patient left: in chair;with chair alarm set;with call bell/phone within reach Nurse Communication: Mobility status PT Visit Diagnosis: History of falling (Z91.81);Muscle weakness (generalized) (M62.81);Difficulty in walking, not elsewhere classified (R26.2)     Time: 4944-9675 PT Time Calculation (min) (ACUTE ONLY): 24 min  Charges:  $Gait Training: 8-22 mins $Therapeutic Activity: 8-22 mins                     Wyona Almas, PT, DPT Acute Rehabilitation Services Pager [phone] Office [phone]    Deno Etienne 10/10/2020, 4:32 PM

## 2020-10-11 LAB — RESP PANEL BY RT-PCR (FLU A&B, COVID) ARPGX2
Influenza A by PCR: NEGATIVE
Influenza B by PCR: NEGATIVE
SARS Coronavirus 2 by RT PCR: NEGATIVE

## 2020-10-11 MED ORDER — ENSURE ENLIVE PO LIQD: 237.0000 mL | Freq: Three times a day (TID) | ORAL | 12 refills | Status: AC

## 2020-10-11 NOTE — TOC Transition Note (Addendum)
Transition of Care [facility]) - CM/SW Discharge Note *[place]: [phone]   Patient Details  Name: [name] MRN: [id number] Date of Birth: 08/12/1926  [facility]) CM/SW Contact:  Allen Feil, LCSW Phone Number: 10/11/2020, 11:24 AM   Clinical Narrative:   Patient medically stable for discharge and going to [facility]. Son informed by phone and visited room to update patient. CSW was informed by admissions liaison [name] that they will be able to accept [name] wife for LTC and they will be in the same room. Discharge clinicals transmitted to facility and COVID test pending. Son contacted and informed regarding today's discharge. Visited with patient and informed him of today's discharge.  COVID test resulted negative. Transport arranged and son [name] called [phone]) and informed that transport arranged to get patient to [facility].      Final next level of care: [facility]) Barriers to Discharge: Barriers Resolved   Patient Goals and CMS Choice Patient states their goals for this hospitalization and ongoing recovery are:: Patient and son in agreement with ST rehab and possibly transitioning to LTC CMS Medicare.gov Compare Post Acute Care list provided to:: Patient Represenative (must comment) (Informed son regarding [email or web address]) Choice offered to / list presented to : Adult Children  Discharge Placement   Existing PASRR number confirmed : 10/06/20          Patient chooses bed at: [facility] Patient to be transferred to facility by: [place] Name of family member notified: [name], son Patient and family notified of of transfer: 10/11/20  Discharge Plan and Services In-house Referral: Clinical Social Work Discharge Planning Services: CM Consult Post Acute Care Choice: [place]          DME  Arranged: N/A DME Agency: NA       HH Arranged: NA HH Agency: NA        Social Determinants of Health ([place]) Interventions  No SDOH interventions requested or needed at discharge   Readmission Risk Interventions No flowsheet data found.

## 2020-10-11 NOTE — Progress Notes (Signed)
Report called and given to [name], LPN at [facility].All of nurse's questions answered to her satisfaction. [name].

## 2020-10-11 NOTE — Care Management Important Message (Signed)
Important Message  Patient Details  Name: [name] MRN: [id number] Date of Birth: 1926-04-17   Medicare Important Message Given:  Yes     Felma Pfefferle [address] 10/11/2020, 3:11 PM

## 2020-10-18 NOTE — Progress Notes (Deleted)
Cardiology Office Note:    Date:  10/18/2020   ID:  [name], DOB 11/11/1926, MRN [id number]  PCP:  Lavone Orn, MD  [facility] Cardiologist:  Larae Grooms, MD  [facility] Electrophysiologist:  None   Referring MD: Lavone Orn, MD   Chief Complaint: Hospital follow-up  History of Present Illness:    [name] is a 87 y.o. male with a hx of AVS with AVR, CAD s/p CABG in 2010, HTN, dementia, bradycardia on AV nodal blocking agents, PVCs who is being seen for hospital follow-up.  Admitted 9/11-9/14 for recurrent fall found to have NSTEMI which was favored to be secondary to trauma and demand ischemia. BNP also elevated. He was chest pain free. Echo showed no WMA with G2DD. No invasive measures were pursued. He was discharged with lasix 20mg  daily. Amlodipine was increased to 10mg  daily.  Admitted 9/16-9/20  for fall, generalized weakness, AKI, leukocytosis, with elevated troponin. HS trop and BNP elevated. HS trop noted to be down trending. No chest pain. No further cardiac work-up was pursued.  today  Past Medical History:  Diagnosis Date   BPH (benign prostatic hyperplasia)    Coronary artery disease with hx of myocardial infarct w/o hx of CABG 2010   Dehydration    Diverticulosis    HOH (hard of hearing)    hearing aids   Hyperlipidemia    Hypertension    Impaired fasting glucose    Memory difficulty 10/19/2012   Memory loss    Mesenteric panniculitis ([place])    Retinal tear    Severe aortic stenosis 2010   S/P AVR-TISSUE VALVE    Past Surgical History:  Procedure Laterality Date   2 VESSEL CABG     AORTIC VALVE REPLACEMENT     BARTLE     CATARACT EXTRACTION, BILATERAL     RT 2010 LT 2014   TONSILLECTOMY      Current Medications: No outpatient medications have been marked as taking for the 10/19/20 encounter (Appointment) with Kathlen Mody, Minta Fair H, PA-C.     Allergies:   Patient has no known allergies.   Social History    Socioeconomic History   Marital status: Married    Spouse name: Not on file   Number of children: 3   Years of education: BA   Highest education level: Not on file  Occupational History   Occupation: Retired  Tobacco Use   Smoking status: Some Days    Types: Pipe   Smokeless tobacco: Never   Tobacco comments:    PIPE  Substance and Sexual Activity   Alcohol use: No    Comment: RARELY   Drug use: No   Sexual activity: Not on file  Other Topics Concern   Not on file  Social History Narrative   Patient lives at home with his wife [name])   Retired.   Education college B.S.   Right handed.   Caffeine - Decaf coffee   Social Determinants of Health   Financial Resource Strain: Not on file  Food Insecurity: Not on file  Transportation Needs: Not on file  Physical Activity: Not on file  Stress: Not on file  Social Connections: Not on file     Family History: The patient's ***family history includes Cancer in his brother; Hypertension in his brother; Stroke in his father. There is no history of Heart attack.  ROS:   Please see the history of present illness.    *** All other systems reviewed and are  negative.  EKGs/Labs/Other Studies Reviewed:    The following studies were reviewed today:    2D echo 09/2020 IMPRESSIONS    1. Left ventricular ejection fraction, by estimation, is 60 to 65%. The  left ventricle has normal function. The left ventricle has no regional  wall motion abnormalities. Left ventricular diastolic parameters are  consistent with Grade II diastolic  dysfunction (pseudonormalization). Elevated left ventricular end-diastolic  pressure. The E/e' is 47.   2. Right ventricular systolic function is mildly reduced. The right  ventricular size is normal.   3. Left atrial size was mildly dilated.   4. Right atrial size was mildly dilated.   5. The mitral valve is grossly normal. Mild mitral valve regurgitation.   6. The aortic valve has been  repaired/replaced. Aortic valve  regurgitation is not visualized. Mild aortic valve sclerosis is present,  with no evidence of aortic valve stenosis. There is a 23 mm Edwards  pericardial valve present in the aortic position.  Procedure Date: 05/21/2010. Aortic valve mean gradient measures 11.0 mmHg.  DI is 0.31. EROA is 1.31 cm2. Findings suggest mild prosthetic valve  stenosis.   7. The inferior vena cava is normal in size with <50% respiratory  variability, suggesting right atrial pressure of 8 mmHg.   Comparison(s): 01/11/2020: LVEF 60-65%, bioprosthetic AVR - mean gradient  10 mmHg, DI 0.55.    2D echo 01/11/2020 IMPRESSIONS    1. Left ventricular ejection fraction, by estimation, is 60 to 65%. The  left ventricle has normal function. The left ventricle has no regional  wall motion abnormalities. Left ventricular diastolic parameters were  normal.   2. Right ventricular systolic function is normal. The right ventricular  size is normal.   3. Left atrial size was moderately dilated.   4. The mitral valve is degenerative. Mild mitral valve regurgitation. No  evidence of mitral stenosis. Moderate mitral annular calcification.   5. Post AVR with bioprosthetic valve type unknown No PVL, normal DVI 0.55  and mean gradient 10 mmHg. The aortic valve has been repaired/replaced.  Aortic valve regurgitation is not visualized. No aortic stenosis is  present.   6. The inferior vena cava is normal in size with greater than 50%  respiratory variability, suggesting right atrial pressure of 3 mmHg.     EKG:  EKG is *** ordered today.  The ekg ordered today demonstrates ***  Recent Labs: 09/30/2020: ALT 20 10/05/2020: B Natriuretic Peptide 1,839.0; Magnesium 2.5; TSH 0.823 10/08/2020: BUN 32; Creatinine, Ser 1.52; Hemoglobin 12.3; Platelets 1,315; Potassium 3.9; Sodium 140  Recent Lipid Panel No results found for: CHOL, TRIG, HDL, CHOLHDL, VLDL, LDLCALC, LDLDIRECT   Risk  Assessment/Calculations:   {Does this patient have ATRIAL FIBRILLATION?:[phone]}   Physical Exam:    VS:  There were no vitals taken for this visit.    Wt Readings from Last 3 Encounters:  10/07/20 134 lb 4.2 oz (60.9 kg)  09/30/20 147 lb 7.8 oz (66.9 kg)  09/29/20 156 lb 8.4 oz (71 kg)     GEN: *** Well nourished, well developed in no acute distress HEENT: Normal NECK: No JVD; No carotid bruits LYMPHATICS: No lymphadenopathy CARDIAC: ***RRR, no murmurs, rubs, gallops RESPIRATORY:  Clear to auscultation without rales, wheezing or rhonchi  ABDOMEN: Soft, non-tender, non-distended MUSCULOSKELETAL:  No edema; No deformity  SKIN: Warm and dry NEUROLOGIC:  Alert and oriented x 3 PSYCHIATRIC:  Normal affect   ASSESSMENT:    No diagnosis found. PLAN:    In order of  problems listed above:  NSTEMI CAD s/p CABG in 2010  AVR   HTN  Bradcyardia Avoid AV nodal blocking agents  HLD LDL 57 2/21  HFpEF  Disposition: Follow up {follow up:15908} with ***   Shared Decision Making/Informed Consent   {Are you ordering a CV Procedure (e.g. stress test, cath, DCCV, TEE, etc)?   Press F2        :[id number]}    Signed, Justeen Hehr Ninfa Meeker, PA-C  10/18/2020 7:20 PM    [facility]

## 2020-10-19 ENCOUNTER — Ambulatory Visit: Payer: Medicare Other | Admitting: Medical

## 2020-11-07 NOTE — Progress Notes (Signed)
[facility] [address] [place] [address]   CLINIC:  Medical Oncology/Hematology  Patient Care Team: Kirby Funk, MD as PCP - General (Internal Medicine) Corky Crafts, MD as PCP - Cardiology (Cardiology) Doreatha Massed, MD as Medical Oncologist (Hematology)  CHIEF COMPLAINTS/PURPOSE OF CONSULTATION:  Evaluation of leukocytosis and thrombocytosis  HISTORY OF PRESENTING ILLNESS:  [name] 87 y.o. male is here because of evaluation of thrombocytosis, at the request of [facility].  Today he reports feeling well. He denies any recent or recurrent infections. He denies itching after hot showers, changing colors of the fingertips, recent fevers, night sweats, and weight loss, and he reports good appetite. He denies history of CVA, MI, and blood clots. He has sores on his left buttock since his hospital visit from 09/29/2020 to 10/05/2020. Prior to his most recent hospitalization he lived at home with his wife, but he was not driving at that time. He walks with the assistance of a walker. He currently lives at [facility] in [place]. 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 He smoked a pipe and cigars before quitting in 1995. He denies family history of leukemia. His brother had an unspecified cancer.   MEDICAL HISTORY:  Past Medical History:  Diagnosis Date   BPH (benign prostatic hyperplasia)    Coronary artery disease with hx of myocardial infarct w/o hx of CABG 2010   Dehydration    Diverticulosis    HOH (hard of hearing)    hearing aids   Hyperlipidemia    Hypertension    Impaired fasting glucose    Memory difficulty 10/19/2012   Memory loss    Mesenteric panniculitis (HCC)    Retinal tear    Severe aortic stenosis 2010   S/P AVR-TISSUE VALVE    SURGICAL HISTORY: Past Surgical History:  Procedure Laterality Date   2 VESSEL CABG     AORTIC VALVE REPLACEMENT     BARTLE      CATARACT EXTRACTION, BILATERAL     RT 2010 LT 2014   TONSILLECTOMY      SOCIAL HISTORY: Social History   Socioeconomic History   Marital status: Married    Spouse name: Not on file   Number of children: 3   Years of education: BA   Highest education level: Not on file  Occupational History   Occupation: Retired  Tobacco Use   Smoking status: Some Days    Types: Pipe   Smokeless tobacco: Never   Tobacco comments:    PIPE  Substance and Sexual Activity   Alcohol use: No    Comment: RARELY   Drug use: No   Sexual activity: Not on file  Other Topics Concern   Not on file  Social History Narrative   Patient lives at home with his wife Keene Breath)   Retired.   Education college B.S.   Right handed.   Caffeine - Decaf coffee   Social Determinants of Health   Financial Resource Strain: Not on file  Food Insecurity: Not on file  Transportation Needs: Not on file  Physical Activity: Not on file  Stress: Not on file  Social Connections: Not on file  Intimate Partner Violence: Not on file    FAMILY HISTORY: Family History  Problem Relation Age of Onset   Stroke Father    Cancer Brother    Hypertension Brother    Heart attack Neg Hx     ALLERGIES:  has No Known Allergies.  MEDICATIONS:  Current Outpatient Medications  Medication Sig Dispense Refill   amLODipine (NORVASC) 10 MG tablet Take 1 tablet (10 mg total) by mouth daily. 30 tablet 3   amoxicillin (AMOXIL) 500 MG tablet TAKE 4 TABLETS BY MOUTH 1 HOUR BEFORE DENTAL PROCEDURE (Patient taking differently: Take 2,000 mg by mouth See admin instructions. Take 2,000 mg by mouth one hour before dental procedures) 4 tablet 3   aspirin EC 81 MG tablet Take 81 mg by mouth daily.     atorvastatin (LIPITOR) 20 MG tablet Take 1 tablet (20 mg total) by mouth daily. 30 tablet 3   feeding supplement (ENSURE ENLIVE / ENSURE PLUS) LIQD Take 237 mLs by mouth 3 (three) times daily between meals. 237 mL 12   furosemide (LASIX) 20 MG  tablet Take 1 tablet (20 mg total) by mouth daily. 30 tablet 3   latanoprost (XALATAN) 0.005 % ophthalmic solution Place 1 drop into both eyes at bedtime.     Multiple Vitamins-Minerals (MULTIVITAMIN WITH MINERALS) tablet Take 1 tablet by mouth daily.     acetaminophen (TYLENOL) 500 MG tablet Take 500 mg by mouth every 6 (six) hours as needed for moderate pain or headache. (Patient not taking: Reported on 11/09/2020)     No current facility-administered medications for this visit.    REVIEW OF SYSTEMS:   Review of Systems  Constitutional:  Positive for fatigue (40%). Negative for appetite change (60%), fever and unexpected weight change.  Skin:  Negative for itching.  Neurological:  Positive for dizziness.  All other systems reviewed and are negative.   PHYSICAL EXAMINATION: ECOG PERFORMANCE STATUS: 2 - Symptomatic, <50% confined to bed  There were no vitals filed for this visit. There were no vitals filed for this visit. Physical Exam Vitals reviewed.  Constitutional:      Appearance: Normal appearance.     Comments: In wheelchair  Cardiovascular:     Rate and Rhythm: Normal rate and regular rhythm.     Pulses: Normal pulses.     Heart sounds: Normal heart sounds.  Pulmonary:     Effort: Pulmonary effort is normal.     Breath sounds: Normal breath sounds.  Abdominal:     Palpations: Abdomen is soft. There is no hepatomegaly, splenomegaly or mass.     Tenderness: There is no abdominal tenderness.  Musculoskeletal:     Right lower leg: No edema.     Left lower leg: No edema.  Lymphadenopathy:     Cervical: No cervical adenopathy.     Right cervical: No superficial cervical adenopathy.    Left cervical: No superficial cervical adenopathy.     Upper Body:     Right upper body: No supraclavicular, axillary or pectoral adenopathy.     Left upper body: No supraclavicular, axillary or pectoral adenopathy.  Neurological:     General: No focal deficit present.     Mental  Status: He is alert and oriented to person, place, and time.  Psychiatric:        Mood and Affect: Mood normal.        Behavior: Behavior normal.     LABORATORY DATA:  I have reviewed the data as listed Recent Results (from the past 2160 hour(s))  CBC     Status: Abnormal   Collection Time: 09/10/20 10:26 AM  Result Value Ref Range   WBC 37.2 (H) 4.0 - 10.5 K/uL   RBC 4.46 4.22 - 5.81 MIL/uL   Hemoglobin 13.1 13.0 -  17.0 g/dL   HCT 41.1 39.0 - 52.0 %   MCV 92.2 80.0 - 100.0 fL   MCH 29.4 26.0 - 34.0 pg   MCHC 31.9 30.0 - 36.0 g/dL   RDW 18.5 (H) 11.5 - 15.5 %   Platelets 895 (H) 150 - 400 K/uL   nRBC 0.0 0.0 - 0.2 %    Comment: Performed at [facility] [address], [place], [place] [address]  Basic metabolic panel     Status: Abnormal   Collection Time: 09/10/20 10:26 AM  Result Value Ref Range   Sodium 142 135 - 145 mmol/L   Potassium 4.1 3.5 - 5.1 mmol/L   Chloride 107 98 - 111 mmol/L   CO2 23 22 - 32 mmol/L   Glucose, Bld 110 (H) 70 - 99 mg/dL    Comment: Glucose reference range applies only to samples taken after fasting for at least 8 hours.   BUN 24 (H) 8 - 23 mg/dL   Creatinine, Ser 1.45 (H) 0.61 - 1.24 mg/dL   Calcium 9.1 8.9 - 10.3 mg/dL   GFR, Estimated 45 (L) >60 mL/min    Comment: (NOTE) Calculated using the CKD-EPI Creatinine Equation (2021)    Anion gap 12 5 - 15    Comment: Performed at [facility] [address], [place], [place] [address]  CBC with Differential     Status: Abnormal   Collection Time: 09/10/20 10:26 AM  Result Value Ref Range   WBC 37.7 (H) 4.0 - 10.5 K/uL   RBC 4.41 4.22 - 5.81 MIL/uL   Hemoglobin 13.1 13.0 - 17.0 g/dL   HCT 40.9 39.0 - 52.0 %   MCV 92.7 80.0 - 100.0 fL   MCH 29.7 26.0 - 34.0 pg   MCHC 32.0 30.0 - 36.0 g/dL   RDW 18.5 (H) 11.5 - 15.5 %   Platelets 930 (HH) 150 - 400 K/uL    Comment: REPEATED TO VERIFY THIS CRITICAL RESULT HAS VERIFIED AND BEEN CALLED TO LORI BERDIK BY KYUNG BAEK ON 08 22 2022  AT 1641, AND HAS BEEN READ BACK.     nRBC 0.1 0.0 - 0.2 %   Neutrophils Relative % 86 %   Neutro Abs 32.0 (H) 1.7 - 7.7 K/uL   Lymphocytes Relative 3 %   Lymphs Abs 1.1 0.7 - 4.0 K/uL   Monocytes Relative 2 %   Monocytes Absolute 0.8 0.1 - 1.0 K/uL   Eosinophils Relative 3 %   Eosinophils Absolute 1.1 (H) 0.0 - 0.5 K/uL   Basophils Relative 6 %   Basophils Absolute 2.3 (H) 0.0 - 0.1 K/uL   Abs Immature Granulocytes 0.00 0.00 - 0.07 K/uL    Comment: Performed at [facility] [address] [place], [place] [address]  Pathologist smear review     Status: None   Collection Time: 09/10/20 10:26 AM  Result Value Ref Range   Path Review Thrombocytosis     Comment: Neutrophilia Reviewed by Chrystie Nose. Saralyn Pilar, M.D. 09/10/2020 Performed at [facility], [place] [address], [place], [place] [address]   Urine Culture     Status: Abnormal   Collection Time: 09/10/20  2:00 PM   Specimen: Urine, Clean Catch  Result Value Ref Range   Specimen Description URINE, CLEAN CATCH    Special Requests NONE    Culture (A)     <10,000 COLONIES/mL INSIGNIFICANT GROWTH Performed at [facility], [place] [address] [place],  [address]  Report Status 09/11/2020 FINAL   CBC with Differential     Status: Abnormal   Collection Time: 09/29/20  5:40 PM  Result Value Ref Range   WBC 32.8 (H) 4.0 - 10.5 K/uL   RBC 4.19 (L) 4.22 - 5.81 MIL/uL   Hemoglobin 12.2 (L) 13.0 - 17.0 g/dL   HCT 38.5 (L) 39.0 - 52.0 %   MCV 91.9 80.0 - 100.0 fL   MCH 29.1 26.0 - 34.0 pg   MCHC 31.7 30.0 - 36.0 g/dL   RDW 18.6 (H) 11.5 - 15.5 %   Platelets 745 (H) 150 - 400 K/uL   nRBC 0.0 0.0 - 0.2 %   Neutrophils Relative % 80 %   Neutro Abs 26.5 (H) 1.7 - 7.7 K/uL   Lymphocytes Relative 5 %   Lymphs Abs 1.6 0.7 - 4.0 K/uL   Monocytes Relative 4 %   Monocytes Absolute 1.2 (H) 0.1 - 1.0 K/uL   Eosinophils Relative 3 %   Eosinophils Absolute 0.9 (H) 0.0 - 0.5 K/uL   Basophils Relative 2 %   Basophils  Absolute 0.7 (H) 0.0 - 0.1 K/uL   WBC Morphology MILD LEFT SHIFT (1-5% METAS, OCC MYELO, OCC BANDS)    Immature Granulocytes 6 %   Abs Immature Granulocytes 1.86 (H) 0.00 - 0.07 K/uL    Comment: Performed at [facility], [address] [place], [place] [address]  Basic metabolic panel     Status: Abnormal   Collection Time: 09/29/20  5:40 PM  Result Value Ref Range   Sodium 139 135 - 145 mmol/L   Potassium 4.6 3.5 - 5.1 mmol/L   Chloride 108 98 - 111 mmol/L   CO2 21 (L) 22 - 32 mmol/L   Glucose, Bld 106 (H) 70 - 99 mg/dL    Comment: Glucose reference range applies only to samples taken after fasting for at least 8 hours.   BUN 34 (H) 8 - 23 mg/dL   Creatinine, Ser 1.63 (H) 0.61 - 1.24 mg/dL   Calcium 9.0 8.9 - 10.3 mg/dL   GFR, Estimated 40 (L) >60 mL/min    Comment: (NOTE) Calculated using the CKD-EPI Creatinine Equation (2021)    Anion gap 10 5 - 15    Comment: Performed at [facility] [address], [place], [place] [address]  CK     Status: None   Collection Time: 09/29/20  5:40 PM  Result Value Ref Range   Total CK 102 49 - 397 U/L    Comment: Performed at [facility], [place] [address], [place], [place] [address]  Pathologist smear review     Status: None   Collection Time: 09/29/20  5:40 PM  Result Value Ref Range   Path Review      Leukocytosis with maturational left shift and increased basophils.  Increased platelets.  Hematology Oncology consult recommended.    Comment: Reviewed by Marlynn Perking. Melina Copa, M.D. 10/01/2020. Performed at [facility], [place] [address], [place], [place] [address]   Urinalysis, Routine w reflex microscopic Urine, Clean Catch     Status: Abnormal   Collection Time: 09/29/20  7:38 PM  Result Value Ref Range   Color, Urine YELLOW YELLOW   APPearance CLEAR CLEAR   Specific Gravity, Urine 1.020 1.005 - 1.030   pH 6.0 5.0 - 8.0   Glucose, UA NEGATIVE NEGATIVE mg/dL   Hgb urine dipstick LARGE (A) NEGATIVE   Bilirubin Urine  NEGATIVE NEGATIVE  Ketones, ur NEGATIVE NEGATIVE mg/dL   Protein, ur 30 (A) NEGATIVE mg/dL   Nitrite NEGATIVE NEGATIVE   Leukocytes,Ua NEGATIVE NEGATIVE    Comment: Performed at [facility], [place] [address], [place], [place] [address]  Urinalysis, Microscopic (reflex)     Status: None   Collection Time: 09/29/20  7:38 PM  Result Value Ref Range   RBC / HPF >50 0 - 5 RBC/hpf   WBC, UA 0-5 0 - 5 WBC/hpf   Bacteria, UA NONE SEEN NONE SEEN   Squamous Epithelial / LPF NONE SEEN 0 - 5   Mucus PRESENT     Comment: Performed at [facility], [place] [address], [place], [place] [address]  Resp Panel by RT-PCR (Flu A&B, Covid) Nasopharyngeal Swab     Status: None   Collection Time: 09/30/20 11:41 AM   Specimen: Nasopharyngeal Swab; Nasopharyngeal(NP) swabs in vial transport medium  Result Value Ref Range   SARS Coronavirus 2 by RT PCR NEGATIVE NEGATIVE    Comment: (NOTE) SARS-CoV-2 target nucleic acids are NOT DETECTED.  The SARS-CoV-2 RNA is generally detectable in upper respiratory specimens during the acute phase of infection. The lowest concentration of SARS-CoV-2 viral copies this assay can detect is 138 copies/mL. A negative result does not preclude SARS-Cov-2 infection and should not be used as the sole basis for treatment or other patient management decisions. A negative result may occur with  improper specimen collection/handling, submission of specimen other than nasopharyngeal swab, presence of viral mutation(s) within the areas targeted by this assay, and inadequate number of viral copies(<138 copies/mL). A negative result must be combined with clinical observations, patient history, and epidemiological information. The expected result is Negative.  Fact Sheet for Patients:  EntrepreneurPulse.com.au  Fact Sheet for Healthcare Providers:  IncredibleEmployment.be  This test is no t yet approved or cleared by the [place] FDA  and  has been authorized for detection and/or diagnosis of SARS-CoV-2 by FDA under an Emergency Use Authorization (EUA). This EUA will remain  in effect (meaning this test can be used) for the duration of the COVID-19 declaration under Section 564(b)(1) of the Act, 21 U.S.C.section 360bbb-3(b)(1), unless the authorization is terminated  or revoked sooner.       Influenza A by PCR NEGATIVE NEGATIVE   Influenza B by PCR NEGATIVE NEGATIVE    Comment: (NOTE) The Xpert Xpress SARS-CoV-2/FLU/RSV plus assay is intended as an aid in the diagnosis of influenza from Nasopharyngeal swab specimens and should not be used as a sole basis for treatment. Nasal washings and aspirates are unacceptable for Xpert Xpress SARS-CoV-2/FLU/RSV testing.  Fact Sheet for Patients: EntrepreneurPulse.com.au  Fact Sheet for Healthcare Providers: IncredibleEmployment.be  This test is not yet approved or cleared by the [place] FDA and has been authorized for detection and/or diagnosis of SARS-CoV-2 by FDA under an Emergency Use Authorization (EUA). This EUA will remain in effect (meaning this test can be used) for the duration of the COVID-19 declaration under Section 564(b)(1) of the Act, 21 U.S.C. section 360bbb-3(b)(1), unless the authorization is terminated or revoked.  Performed at [facility], [place] [address], [place], [place] [address]   Comprehensive metabolic panel     Status: Abnormal   Collection Time: 09/30/20 11:41 AM  Result Value Ref Range   Sodium 143 135 - 145 mmol/L   Potassium 4.5 3.5 - 5.1 mmol/L   Chloride 110 98 - 111 mmol/L   CO2 23 22 - 32 mmol/L  Glucose, Bld 125 (H) 70 - 99 mg/dL    Comment: Glucose reference range applies only to samples taken after fasting for at least 8 hours.   BUN 38 (H) 8 - 23 mg/dL   Creatinine, Ser 1.78 (H) 0.61 - 1.24 mg/dL   Calcium 9.4 8.9 - 10.3 mg/dL   Total Protein 7.9 6.5 - 8.1 g/dL    Albumin 4.0 3.5 - 5.0 g/dL   AST 56 (H) 15 - 41 U/L   ALT 20 0 - 44 U/L   Alkaline Phosphatase 89 38 - 126 U/L   Total Bilirubin 1.5 (H) 0.3 - 1.2 mg/dL   GFR, Estimated 36 (L) >60 mL/min    Comment: (NOTE) Calculated using the CKD-EPI Creatinine Equation (2021)    Anion gap 10 5 - 15    Comment: Performed at [facility], [place] [address], [place], [place] [address]  CBC     Status: Abnormal   Collection Time: 09/30/20 11:41 AM  Result Value Ref Range   WBC 43.6 (H) 4.0 - 10.5 K/uL   RBC 4.30 4.22 - 5.81 MIL/uL   Hemoglobin 12.8 (L) 13.0 - 17.0 g/dL   HCT 38.6 (L) 39.0 - 52.0 %   MCV 89.8 80.0 - 100.0 fL   MCH 29.8 26.0 - 34.0 pg   MCHC 33.2 30.0 - 36.0 g/dL   RDW 18.8 (H) 11.5 - 15.5 %   Platelets 756 (H) 150 - 400 K/uL   nRBC 0.0 0.0 - 0.2 %    Comment: Performed at [facility], [place] [address], [place], [place] [address]  Urinalysis, Routine w reflex microscopic Urine, Clean Catch     Status: Abnormal   Collection Time: 09/30/20 11:41 AM  Result Value Ref Range   Color, Urine YELLOW (A) YELLOW   APPearance CLEAR (A) CLEAR   Specific Gravity, Urine 1.025 1.005 - 1.030   pH 5.5 5.0 - 8.0   Glucose, UA NEGATIVE NEGATIVE mg/dL   Hgb urine dipstick LARGE (A) NEGATIVE   Bilirubin Urine NEGATIVE NEGATIVE   Ketones, ur TRACE (A) NEGATIVE mg/dL   Protein, ur 100 (A) NEGATIVE mg/dL   Nitrite NEGATIVE NEGATIVE   Leukocytes,Ua NEGATIVE NEGATIVE   RBC / HPF >50 (H) 0 - 5 RBC/hpf   WBC, UA 0-5 0 - 5 WBC/hpf   Bacteria, UA NONE SEEN NONE SEEN   Squamous Epithelial / LPF 0-5 0 - 5    Comment: Performed at [facility], [place] [address], [place], [place] [address]  Urine Culture     Status: None   Collection Time: 09/30/20 11:41 AM   Specimen: Urine, Clean Catch  Result Value Ref Range   Specimen Description      URINE, CLEAN CATCH Performed at [facility], [place] [address], [place], [place] [address]     Special Requests      NONE Performed at [facility], [place] [address], [place], [place] [address]    Culture      NO GROWTH Performed at [facility], [place] [address] [place], [place] [address]    Report Status 10/01/2020 FINAL   Troponin I (High Sensitivity)     Status: Abnormal   Collection Time: 09/30/20 11:41 AM  Result Value Ref Range   Troponin I (High Sensitivity) 1,908 (HH) <18 ng/L    Comment: CRITICAL RESULT CALLED TO, READ BACK BY AND VERIFIED WITH: I,BLAIR AT 1331 ON 09/30/2020 BY P,LUZOLO (NOTE) Elevated high  sensitivity troponin I (hsTnI) values and significant  changes across serial measurements may suggest ACS but many other  chronic and acute conditions are known to elevate hsTnI results.  Refer to the Links section for chest pain algorithms and additional  guidance. Performed at [facility], [place] [address] [place], [place] [address]   Brain natriuretic peptide     Status: Abnormal   Collection Time: 09/30/20 11:41 AM  Result Value Ref Range   B Natriuretic Peptide 2,356.4 (H) 0.0 - 100.0 pg/mL    Comment: Performed at [facility], [place] [address] [place], [place] [address]  Differential     Status: Abnormal   Collection Time: 09/30/20 11:41 AM  Result Value Ref Range   Neutrophils Relative % 84 %   Neutro Abs 36.9 (H) 1.7 - 7.7 K/uL   Lymphocytes Relative 4 %   Lymphs Abs 1.9 0.7 - 4.0 K/uL   Monocytes Relative 4 %   Monocytes Absolute 1.5 (H) 0.1 - 1.0 K/uL   Eosinophils Relative 1 %   Eosinophils Absolute 0.4 0.0 - 0.5 K/uL   Basophils Relative 2 %   Basophils Absolute 0.8 (H) 0.0 - 0.1 K/uL   Immature Granulocytes 5 %   Abs Immature Granulocytes 2.12 (H) 0.00 - 0.07 K/uL    Comment: Performed at [facility], [place] [address] [place], [place] [address]  Procalcitonin - Baseline     Status: None   Collection Time: 09/30/20 11:41 AM  Result Value Ref Range    Procalcitonin 0.23 ng/mL    Comment:        Interpretation: PCT (Procalcitonin) <= 0.5 ng/mL: Systemic infection (sepsis) is not likely. Local bacterial infection is possible. (NOTE)       Sepsis PCT Algorithm           Lower Respiratory Tract                                      Infection PCT Algorithm    ----------------------------     ----------------------------         PCT < 0.25 ng/mL                PCT < 0.10 ng/mL          Strongly encourage             Strongly discourage   discontinuation of antibiotics    initiation of antibiotics    ----------------------------     -----------------------------       PCT 0.25 - 0.50 ng/mL            PCT 0.10 - 0.25 ng/mL               OR       >80% decrease in PCT            Discourage initiation of                                            antibiotics      Encourage discontinuation           of antibiotics    ----------------------------     -----------------------------         PCT >= 0.50 ng/mL  PCT 0.26 - 0.50 ng/mL               AND        <80% decrease in PCT             Encourage initiation of                                             antibiotics       Encourage continuation           of antibiotics    ----------------------------     -----------------------------        PCT >= 0.50 ng/mL                  PCT > 0.50 ng/mL               AND         increase in PCT                  Strongly encourage                                      initiation of antibiotics    Strongly encourage escalation           of antibiotics                                     -----------------------------                                           PCT <= 0.25 ng/mL                                                 OR                                        > 80% decrease in PCT                                      Discontinue / Do not initiate                                             antibiotics  Performed at [facility] [address], [place], [place] [address]   Troponin I (High Sensitivity)     Status: Abnormal   Collection Time: 09/30/20  2:11 PM  Result Value Ref Range   Troponin I (High Sensitivity) 2,850 (HH) <18 ng/L    Comment: CRITICAL RESULT CALLED TO, READ BACK BY AND VERIFIED WITH: LUCY HAMILTON AT 1502 ON 90/11/2020 BY P,LUZOLO (NOTE) Elevated high sensitivity troponin I (hsTnI) values and significant  changes across serial measurements may suggest ACS but many other  chronic and acute conditions are known to elevate hsTnI results.  Refer to the Links section for chest pain algorithms and additional  guidance. Performed at [facility], [place] [address], [place], [place] [address]   Culture, blood (routine x 2)     Status: None   Collection Time: 09/30/20  6:33 PM   Specimen: BLOOD  Result Value Ref Range   Specimen Description      BLOOD LEFT ANTECUBITAL Performed at [facility] [address], [place], [place] [address]    Special Requests      BOTTLES DRAWN AEROBIC AND ANAEROBIC Blood Culture adequate volume Performed at [facility] [address], [place], [place] [address]    Culture      NO GROWTH 5 DAYS Performed at [facility], [place] [address] [place], [place] [address]    Report Status 10/05/2020 FINAL   Culture, blood (routine x 2)     Status: None   Collection Time: 09/30/20  6:33 PM   Specimen: BLOOD  Result Value Ref Range   Specimen Description      BLOOD BLOOD LEFT HAND Performed at [facility] [address] [place], [place] [address]    Special Requests      BOTTLES DRAWN AEROBIC AND ANAEROBIC Blood Culture adequate volume Performed at [facility] [address], [place], [place] [address]    Culture      NO GROWTH 5 DAYS Performed at [facility], [place] [address], [place], [place] [address]    Report Status 10/05/2020 FINAL    Lactic acid, plasma     Status: None   Collection Time: 09/30/20  6:33 PM  Result Value Ref Range   Lactic Acid, Venous 1.7 0.5 - 1.9 mmol/L    Comment: Performed at [facility], [place] [address], [place], [place] [address]  Lactic acid, plasma     Status: None   Collection Time: 09/30/20  9:28 PM  Result Value Ref Range   Lactic Acid, Venous 1.4 0.5 - 1.9 mmol/L    Comment: Performed at [facility], [place] [address] [place], [place] [address]  Basic metabolic panel     Status: Abnormal   Collection Time: 10/01/20  4:22 AM  Result Value Ref Range   Sodium 139 135 - 145 mmol/L   Potassium 3.9 3.5 - 5.1 mmol/L   Chloride 107 98 - 111 mmol/L   CO2 24 22 - 32 mmol/L   Glucose, Bld 112 (H) 70 - 99 mg/dL    Comment: Glucose reference range applies only to samples taken after fasting for at least 8 hours.   BUN 38 (H) 8 - 23 mg/dL   Creatinine, Ser 1.78 (H) 0.61 - 1.24 mg/dL   Calcium 8.5 (L) 8.9 - 10.3 mg/dL   GFR, Estimated 36 (L) >60 mL/min    Comment: (NOTE) Calculated using the CKD-EPI Creatinine Equation (2021)    Anion gap 8 5 - 15    Comment: Performed at [facility], [place] [address], [place], [place] [address]  CBC     Status: Abnormal   Collection Time: 10/01/20  4:22 AM  Result Value Ref Range   WBC 31.1 (H) 4.0 - 10.5 K/uL   RBC 3.73 (L) 4.22 - 5.81 MIL/uL   Hemoglobin 11.2 (L) 13.0 - 17.0 g/dL   HCT  33.3 (L) 39.0 - 52.0 %   MCV 89.3 80.0 - 100.0 fL   MCH 30.0 26.0 - 34.0 pg   MCHC 33.6 30.0 - 36.0 g/dL   RDW 18.7 (H) 11.5 - 15.5 %   Platelets 682 (H) 150 - 400 K/uL   nRBC 0.0 0.0 - 0.2 %    Comment: Performed at [facility], [place] [address], [place], [place] [address]  Procalcitonin     Status: None   Collection Time: 10/01/20  4:22 AM  Result Value Ref Range   Procalcitonin 0.20 ng/mL    Comment:        Interpretation: PCT (Procalcitonin) <= 0.5 ng/mL: Systemic infection (sepsis) is  not likely. Local bacterial infection is possible. (NOTE)       Sepsis PCT Algorithm           Lower Respiratory Tract                                      Infection PCT Algorithm    ----------------------------     ----------------------------         PCT < 0.25 ng/mL                PCT < 0.10 ng/mL          Strongly encourage             Strongly discourage   discontinuation of antibiotics    initiation of antibiotics    ----------------------------     -----------------------------       PCT 0.25 - 0.50 ng/mL            PCT 0.10 - 0.25 ng/mL               OR       >80% decrease in PCT            Discourage initiation of                                            antibiotics      Encourage discontinuation           of antibiotics    ----------------------------     -----------------------------         PCT >= 0.50 ng/mL              PCT 0.26 - 0.50 ng/mL               AND        <80% decrease in PCT             Encourage initiation of                                             antibiotics       Encourage continuation           of antibiotics    ----------------------------     -----------------------------        PCT >= 0.50 ng/mL                  PCT > 0.50 ng/mL  AND         increase in PCT                  Strongly encourage                                      initiation of antibiotics    Strongly encourage escalation           of antibiotics                                     -----------------------------                                           PCT <= 0.25 ng/mL                                                 OR                                        > 80% decrease in PCT                                      Discontinue / Do not initiate                                             antibiotics  Performed at [facility] [address] [place], [place] [address]   ECHOCARDIOGRAM COMPLETE     Status: None   Collection Time: 10/01/20   1:28 PM  Result Value Ref Range   Weight 2,359.8 oz   BP 142/62 mmHg   S' Lateral 4.30 cm   AR max vel 1.37 cm2   AV Area VTI 1.31 cm2   AV Mean grad 11.0 mmHg   AV Peak grad 17.1 mmHg   Ao pk vel 2.07 m/s   Area-P 1/2 4.39 cm2   AV Area mean vel 1.25 cm2  Procalcitonin     Status: None   Collection Time: 10/02/20  4:03 AM  Result Value Ref Range   Procalcitonin 0.13 ng/mL    Comment:        Interpretation: PCT (Procalcitonin) <= 0.5 ng/mL: Systemic infection (sepsis) is not likely. Local bacterial infection is possible. (NOTE)       Sepsis PCT Algorithm           Lower Respiratory Tract                                      Infection PCT Algorithm    ----------------------------     ----------------------------         PCT < 0.25 ng/mL  PCT < 0.10 ng/mL          Strongly encourage             Strongly discourage   discontinuation of antibiotics    initiation of antibiotics    ----------------------------     -----------------------------       PCT 0.25 - 0.50 ng/mL            PCT 0.10 - 0.25 ng/mL               OR       >80% decrease in PCT            Discourage initiation of                                            antibiotics      Encourage discontinuation           of antibiotics    ----------------------------     -----------------------------         PCT >= 0.50 ng/mL              PCT 0.26 - 0.50 ng/mL               AND        <80% decrease in PCT             Encourage initiation of                                             antibiotics       Encourage continuation           of antibiotics    ----------------------------     -----------------------------        PCT >= 0.50 ng/mL                  PCT > 0.50 ng/mL               AND         increase in PCT                  Strongly encourage                                      initiation of antibiotics    Strongly encourage escalation           of antibiotics                                      -----------------------------                                           PCT <= 0.25 ng/mL                                                 OR                                        >  80% decrease in PCT                                      Discontinue / Do not initiate                                             antibiotics  Performed at [facility] [address], [place], [place] [address]   CBC     Status: Abnormal   Collection Time: 10/02/20  4:03 AM  Result Value Ref Range   WBC 24.0 (H) 4.0 - 10.5 K/uL   RBC 3.63 (L) 4.22 - 5.81 MIL/uL   Hemoglobin 10.7 (L) 13.0 - 17.0 g/dL   HCT 32.4 (L) 39.0 - 52.0 %   MCV 89.3 80.0 - 100.0 fL   MCH 29.5 26.0 - 34.0 pg   MCHC 33.0 30.0 - 36.0 g/dL   RDW 18.6 (H) 11.5 - 15.5 %   Platelets 709 (H) 150 - 400 K/uL   nRBC 0.0 0.0 - 0.2 %    Comment: Performed at [facility], [place] [address], [place], [place] [address]  Basic metabolic panel     Status: Abnormal   Collection Time: 10/02/20  4:03 AM  Result Value Ref Range   Sodium 139 135 - 145 mmol/L   Potassium 3.5 3.5 - 5.1 mmol/L   Chloride 107 98 - 111 mmol/L   CO2 24 22 - 32 mmol/L   Glucose, Bld 102 (H) 70 - 99 mg/dL    Comment: Glucose reference range applies only to samples taken after fasting for at least 8 hours.   BUN 36 (H) 8 - 23 mg/dL   Creatinine, Ser 1.68 (H) 0.61 - 1.24 mg/dL   Calcium 8.5 (L) 8.9 - 10.3 mg/dL   GFR, Estimated 38 (L) >60 mL/min    Comment: (NOTE) Calculated using the CKD-EPI Creatinine Equation (2021)    Anion gap 8 5 - 15    Comment: Performed at [facility], [place] [address] [place], [place] [address]  Basic metabolic panel     Status: Abnormal   Collection Time: 10/03/20  4:20 AM  Result Value Ref Range   Sodium 142 135 - 145 mmol/L   Potassium 3.8 3.5 - 5.1 mmol/L   Chloride 108 98 - 111 mmol/L   CO2 24 22 - 32 mmol/L   Glucose, Bld 106 (H) 70 - 99 mg/dL    Comment: Glucose reference  range applies only to samples taken after fasting for at least 8 hours.   BUN 33 (H) 8 - 23 mg/dL   Creatinine, Ser 1.49 (H) 0.61 - 1.24 mg/dL   Calcium 8.5 (L) 8.9 - 10.3 mg/dL   GFR, Estimated 44 (L) >60 mL/min    Comment: (NOTE) Calculated using the CKD-EPI Creatinine Equation (2021)    Anion gap 10 5 - 15    Comment: Performed at [facility], [place] [address], [place],  [address]  CBC     Status: Abnormal   Collection Time: 10/03/20  4:20 AM  Result Value Ref Range   WBC 26.9 (H) 4.0 - 10.5 K/uL   RBC 4.00 (L) 4.22 - 5.81 MIL/uL   Hemoglobin 11.8 (L) 13.0 - 17.0 g/dL   HCT 35.8 (L)  39.0 - 52.0 %   MCV 89.5 80.0 - 100.0 fL   MCH 29.5 26.0 - 34.0 pg   MCHC 33.0 30.0 - 36.0 g/dL   RDW 18.5 (H) 11.5 - 15.5 %   Platelets 883 (H) 150 - 400 K/uL   nRBC 0.0 0.0 - 0.2 %    Comment: Performed at [facility], [place] [address], [place], [place] [address]  CBC with Differential     Status: Abnormal   Collection Time: 10/05/20  1:47 PM  Result Value Ref Range   WBC 31.5 (H) 4.0 - 10.5 K/uL   RBC 4.27 4.22 - 5.81 MIL/uL   Hemoglobin 12.8 (L) 13.0 - 17.0 g/dL   HCT 39.2 39.0 - 52.0 %   MCV 91.8 80.0 - 100.0 fL   MCH 30.0 26.0 - 34.0 pg   MCHC 32.7 30.0 - 36.0 g/dL   RDW 18.8 (H) 11.5 - 15.5 %   Platelets 1,004 (HH) 150 - 400 K/uL    Comment: This critical result has verified and been called to Lynetta Mare by Marijean Heath on 09 16 2022 at 1635, and has been read back.  REPEATED TO VERIFY PLATELET COUNT CONFIRMED BY SMEAR CORRECTED ON 09/16 AT 1913: PREVIOUSLY REPORTED AS 1004 This critical result has verified and been called to C YANG,RN by Marijean Heath on 09 16 2022 at 1635, and has been read back.     nRBC 0.0 0.0 - 0.2 %   Neutrophils Relative % 87 %   Neutro Abs 27.4 (H) 1.7 - 7.7 K/uL   Lymphocytes Relative 6 %   Lymphs Abs 1.9 0.7 - 4.0 K/uL   Monocytes Relative 3 %   Monocytes Absolute 0.9 0.1 - 1.0 K/uL   Eosinophils Relative 3 %    Eosinophils Absolute 0.9 (H) 0.0 - 0.5 K/uL   Basophils Relative 1 %   Basophils Absolute 0.3 (H) 0.0 - 0.1 K/uL   WBC Morphology See Note     Comment: Mild Left Shift. 1 to 5% Metas and Myelos, Occ Pro Noted.   nRBC 0 0 /100 WBC   Abs Immature Granulocytes 0.00 0.00 - 0.07 K/uL    Comment: Performed at [facility], [place] [address], [place], [place] [address]  Basic metabolic panel     Status: Abnormal   Collection Time: 10/05/20  1:47 PM  Result Value Ref Range   Sodium 142 135 - 145 mmol/L   Potassium 4.6 3.5 - 5.1 mmol/L   Chloride 110 98 - 111 mmol/L   CO2 23 22 - 32 mmol/L   Glucose, Bld 105 (H) 70 - 99 mg/dL    Comment: Glucose reference range applies only to samples taken after fasting for at least 8 hours.   BUN 28 (H) 8 - 23 mg/dL   Creatinine, Ser 1.66 (H) 0.61 - 1.24 mg/dL   Calcium 8.7 (L) 8.9 - 10.3 mg/dL   GFR, Estimated 39 (L) >60 mL/min    Comment: (NOTE) Calculated using the CKD-EPI Creatinine Equation (2021)    Anion gap 9 5 - 15    Comment: Performed at [facility] [address], [place], [place] [address]  Brain natriuretic peptide     Status: Abnormal   Collection Time: 10/05/20  1:47 PM  Result Value Ref Range   B Natriuretic Peptide 1,839.0 (H) 0.0 - 100.0 pg/mL    Comment: Performed at [facility] [address], [place],  [address]  Magnesium  Status: Abnormal   Collection Time: 10/05/20  1:47 PM  Result Value Ref Range   Magnesium 2.5 (H) 1.7 - 2.4 mg/dL    Comment: Performed at [facility] [address], [place], [place] [address]  TSH     Status: None   Collection Time: 10/05/20  1:47 PM  Result Value Ref Range   TSH 0.823 0.350 - 4.500 uIU/mL    Comment: Performed by a 3rd Generation assay with a functional sensitivity of <=0.01 uIU/mL. Performed at [facility], [place] [address], [place], [place] [address]   Troponin I (High Sensitivity)     Status: Abnormal   Collection Time: 10/05/20  1:47 PM   Result Value Ref Range   Troponin I (High Sensitivity) 395 (HH) <18 ng/L    Comment: CRITICAL RESULT CALLED TO, READ BACK BY AND VERIFIED WITH: C YANG RN BY SSTEPHENS [phone] (NOTE) Elevated high sensitivity troponin I (hsTnI) values and significant  changes across serial measurements may suggest ACS but many other  chronic and acute conditions are known to elevate hsTnI results.  Refer to the Links section for chest pain algorithms and additional  guidance. Performed at [facility], [place] [address] [place], [place] [address]   Resp Panel by RT-PCR (Flu A&B, Covid) Nasopharyngeal Swab     Status: None   Collection Time: 10/05/20  1:47 PM   Specimen: Nasopharyngeal Swab; Nasopharyngeal(NP) swabs in vial transport medium  Result Value Ref Range   SARS Coronavirus 2 by RT PCR NEGATIVE NEGATIVE    Comment: (NOTE) SARS-CoV-2 target nucleic acids are NOT DETECTED.  The SARS-CoV-2 RNA is generally detectable in upper respiratory specimens during the acute phase of infection. The lowest concentration of SARS-CoV-2 viral copies this assay can detect is 138 copies/mL. A negative result does not preclude SARS-Cov-2 infection and should not be used as the sole basis for treatment or other patient management decisions. A negative result may occur with  improper specimen collection/handling, submission of specimen other than nasopharyngeal swab, presence of viral mutation(s) within the areas targeted by this assay, and inadequate number of viral copies(<138 copies/mL). A negative result must be combined with clinical observations, patient history, and epidemiological information. The expected result is Negative.  Fact Sheet for Patients:  EntrepreneurPulse.com.au  Fact Sheet for Healthcare Providers:  IncredibleEmployment.be  This test is no t yet approved or cleared by the [place] FDA and  has been authorized for detection and/or  diagnosis of SARS-CoV-2 by FDA under an Emergency Use Authorization (EUA). This EUA will remain  in effect (meaning this test can be used) for the duration of the COVID-19 declaration under Section 564(b)(1) of the Act, 21 U.S.C.section 360bbb-3(b)(1), unless the authorization is terminated  or revoked sooner.       Influenza A by PCR NEGATIVE NEGATIVE   Influenza B by PCR NEGATIVE NEGATIVE    Comment: (NOTE) The Xpert Xpress SARS-CoV-2/FLU/RSV plus assay is intended as an aid in the diagnosis of influenza from Nasopharyngeal swab specimens and should not be used as a sole basis for treatment. Nasal washings and aspirates are unacceptable for Xpert Xpress SARS-CoV-2/FLU/RSV testing.  Fact Sheet for Patients: EntrepreneurPulse.com.au  Fact Sheet for Healthcare Providers: IncredibleEmployment.be  This test is not yet approved or cleared by the [place] FDA and has been authorized for detection and/or diagnosis of SARS-CoV-2 by FDA under an Emergency Use Authorization (EUA). This EUA will remain in effect (meaning this test can be used)  for the duration of the COVID-19 declaration under Section 564(b)(1) of the Act, 21 U.S.C. section 360bbb-3(b)(1), unless the authorization is terminated or revoked.  Performed at [facility], [place] [address] [place], [place] [address]   Troponin I (High Sensitivity)     Status: Abnormal   Collection Time: 10/05/20  5:45 PM  Result Value Ref Range   Troponin I (High Sensitivity) 332 (HH) <18 ng/L    Comment: CRITICAL VALUE NOTED.  VALUE IS CONSISTENT WITH PREVIOUSLY REPORTED AND CALLED VALUE. (NOTE) Elevated high sensitivity troponin I (hsTnI) values and significant  changes across serial measurements may suggest ACS but many other  chronic and acute conditions are known to elevate hsTnI results.  Refer to the Links section for chest pain algorithms and additional  guidance. Performed at [facility], [place] [address] [place], [place] [address]   Urine Culture     Status: None   Collection Time: 10/06/20  1:05 AM   Specimen: In/Out Cath Urine  Result Value Ref Range   Specimen Description IN/OUT CATH URINE    Special Requests NONE    Culture      NO GROWTH Performed at [facility], [place] [address] [place], [place] [address]    Report Status 10/07/2020 FINAL   Urinalysis, Routine w reflex microscopic Urine, Catheterized     Status: Abnormal   Collection Time: 10/06/20  2:03 AM  Result Value Ref Range   Color, Urine YELLOW YELLOW   APPearance CLEAR CLEAR   Specific Gravity, Urine 1.013 1.005 - 1.030   pH 7.0 5.0 - 8.0   Glucose, UA NEGATIVE NEGATIVE mg/dL   Hgb urine dipstick NEGATIVE NEGATIVE   Bilirubin Urine NEGATIVE NEGATIVE   Ketones, ur 5 (A) NEGATIVE mg/dL   Protein, ur 30 (A) NEGATIVE mg/dL   Nitrite NEGATIVE NEGATIVE   Leukocytes,Ua NEGATIVE NEGATIVE   RBC / HPF 0-5 0 - 5 RBC/hpf   WBC, UA 6-10 0 - 5 WBC/hpf   Bacteria, UA NONE SEEN NONE SEEN   Squamous Epithelial / LPF 0-5 0 - 5   Mucus PRESENT     Comment: Performed at [facility], [place] [address], , [place] [address]  CK     Status: None   Collection Time: 10/07/20  5:10 AM  Result Value Ref Range   Total CK 49 49 - 397 U/L    Comment: Performed at [facility], [place] [address] [place], [place] [address]  CBC     Status: Abnormal   Collection Time: 10/07/20  5:10 AM  Result Value Ref Range   WBC 35.0 (H) 4.0 - 10.5 K/uL   RBC 4.59 4.22 - 5.81 MIL/uL   Hemoglobin 13.6 13.0 - 17.0 g/dL   HCT 40.8 39.0 - 52.0 %   MCV 88.9 80.0 - 100.0 fL   MCH 29.6 26.0 - 34.0 pg   MCHC 33.3 30.0 - 36.0 g/dL   RDW 18.2 (H) 11.5 - 15.5 %   Platelets 1,319 (HH) 150 - 400 K/uL    Comment: CRITICAL VALUE NOTED.  VALUE IS CONSISTENT WITH PREVIOUSLY REPORTED AND CALLED VALUE. REPEATED TO VERIFY THIS CRITICAL RESULT HAS VERIFIED AND BEEN CALLED TO E CASTRO RN BY CANDACE HAYES ON 09 18 2022  AT 0609, AND HAS BEEN READ BACK.     nRBC 0.0 0.0 - 0.2 %    Comment: Performed at [facility], [place] [address], [place], [place] [address]  Basic metabolic panel     Status: Abnormal   Collection Time: 10/07/20  5:10 AM  Result Value Ref Range   Sodium 140 135 - 145 mmol/L   Potassium 3.8 3.5 - 5.1 mmol/L   Chloride 106 98 - 111 mmol/L   CO2 23 22 - 32 mmol/L   Glucose, Bld 107 (H) 70 - 99 mg/dL    Comment: Glucose reference range applies only to samples taken after fasting for at least 8 hours.   BUN 26 (H) 8 - 23 mg/dL   Creatinine, Ser 1.45 (H) 0.61 - 1.24 mg/dL   Calcium 8.8 (L) 8.9 - 10.3 mg/dL   GFR, Estimated 45 (L) >60 mL/min    Comment: (NOTE) Calculated using the CKD-EPI Creatinine Equation (2021)    Anion gap 11 5 - 15    Comment: Performed at [facility] [address], [place], [place] [address]  Pathologist smear review     Status: None   Collection Time: 10/07/20  5:10 AM  Result Value Ref Range   Path Review Marked thrombocytosis     Comment: Neutrophilia Recommend correlation with clinical history Oncology evaluations indicated Reviewed by Mark S. Martinique, M.D. 10/08/20 Performed at [facility], [place] [address], [place], [place] [address]   Save Smear [facility])     Status: None   Collection Time: 10/07/20  5:10 AM  Result Value Ref Range   Smear Review SMEAR STAINED AND AVAILABLE FOR REVIEW     Comment: Performed at [place] [address] [place], [place] [address]  Basic metabolic panel     Status: Abnormal   Collection Time: 10/08/20  1:24 AM  Result Value Ref Range   Sodium 140 135 - 145 mmol/L   Potassium 3.9 3.5 - 5.1 mmol/L   Chloride 109 98 - 111 mmol/L   CO2 23 22 - 32 mmol/L   Glucose, Bld 152 (H) 70 - 99 mg/dL    Comment: Glucose reference range applies only to samples taken after fasting for at least 8 hours.   BUN 32 (H) 8 - 23 mg/dL   Creatinine, Ser 1.52 (H) 0.61 - 1.24 mg/dL   Calcium 8.5 (L) 8.9 - 10.3  mg/dL   GFR, Estimated 43 (L) >60 mL/min    Comment: (NOTE) Calculated using the CKD-EPI Creatinine Equation (2021)    Anion gap 8 5 - 15    Comment: Performed at [facility] [address], [place], [place] [address]  CBC     Status: Abnormal   Collection Time: 10/08/20  1:24 AM  Result Value Ref Range   WBC 26.6 (H) 4.0 - 10.5 K/uL   RBC 4.18 (L) 4.22 - 5.81 MIL/uL   Hemoglobin 12.3 (L) 13.0 - 17.0 g/dL   HCT 37.6 (L) 39.0 - 52.0 %   MCV 90.0 80.0 - 100.0 fL   MCH 29.4 26.0 - 34.0 pg   MCHC 32.7 30.0 - 36.0 g/dL   RDW 18.6 (H) 11.5 - 15.5 %   Platelets 1,315 (HH) 150 - 400 K/uL    Comment: CRITICAL VALUE NOTED.  VALUE IS CONSISTENT WITH PREVIOUSLY REPORTED AND CALLED VALUE. REPEATED TO VERIFY    nRBC 0.0 0.0 - 0.2 %    Comment: Performed at [facility], [place] [address] [place], [place] [address]  Vitamin B12     Status: Abnormal   Collection Time: 10/08/20  1:24 AM  Result Value Ref Range  Vitamin B-12 1,862 (H) 180 - 914 pg/mL    Comment: (NOTE) This assay is not validated for testing neonatal or myeloproliferative syndrome specimens for Vitamin B12 levels. Performed at [facility], [place] [address], [place], [place] [address]   Folate     Status: None   Collection Time: 10/08/20  1:24 AM  Result Value Ref Range   Folate 21.6 >5.9 ng/mL    Comment: Performed at [facility], [place] [address] [place], [place] [address]  Ammonia     Status: None   Collection Time: 10/08/20  1:24 AM  Result Value Ref Range   Ammonia 23 9 - 35 umol/L    Comment: Performed at [facility], [place] [address], [place], [place] [address]  Resp Panel by RT-PCR (Flu A&B, Covid) Nasopharyngeal Swab     Status: None   Collection Time: 10/11/20 11:40 AM   Specimen: Nasopharyngeal Swab; Nasopharyngeal(NP) swabs in vial transport medium  Result Value Ref Range   SARS Coronavirus 2 by RT PCR NEGATIVE NEGATIVE    Comment: (NOTE) SARS-CoV-2 target nucleic acids are NOT  DETECTED.  The SARS-CoV-2 RNA is generally detectable in upper respiratory specimens during the acute phase of infection. The lowest concentration of SARS-CoV-2 viral copies this assay can detect is 138 copies/mL. A negative result does not preclude SARS-Cov-2 infection and should not be used as the sole basis for treatment or other patient management decisions. A negative result may occur with  improper specimen collection/handling, submission of specimen other than nasopharyngeal swab, presence of viral mutation(s) within the areas targeted by this assay, and inadequate number of viral copies(<138 copies/mL). A negative result must be combined with clinical observations, patient history, and epidemiological information. The expected result is Negative.  Fact Sheet for Patients:  EntrepreneurPulse.com.au  Fact Sheet for Healthcare Providers:  IncredibleEmployment.be  This test is no t yet approved or cleared by the [place] FDA and  has been authorized for detection and/or diagnosis of SARS-CoV-2 by FDA under an Emergency Use Authorization (EUA). This EUA will remain  in effect (meaning this test can be used) for the duration of the COVID-19 declaration under Section 564(b)(1) of the Act, 21 U.S.C.section 360bbb-3(b)(1), unless the authorization is terminated  or revoked sooner.       Influenza A by PCR NEGATIVE NEGATIVE   Influenza B by PCR NEGATIVE NEGATIVE    Comment: (NOTE) The Xpert Xpress SARS-CoV-2/FLU/RSV plus assay is intended as an aid in the diagnosis of influenza from Nasopharyngeal swab specimens and should not be used as a sole basis for treatment. Nasal washings and aspirates are unacceptable for Xpert Xpress SARS-CoV-2/FLU/RSV testing.  Fact Sheet for Patients: EntrepreneurPulse.com.au  Fact Sheet for Healthcare Providers: IncredibleEmployment.be  This test is not yet approved or  cleared by the [place] FDA and has been authorized for detection and/or diagnosis of SARS-CoV-2 by FDA under an Emergency Use Authorization (EUA). This EUA will remain in effect (meaning this test can be used) for the duration of the COVID-19 declaration under Section 564(b)(1) of the Act, 21 U.S.C. section 360bbb-3(b)(1), unless the authorization is terminated or revoked.  Performed at [facility], [place] [address], [place], [place] [address]     RADIOGRAPHIC STUDIES: I have personally reviewed the radiological images as listed and agreed with the findings in the report. No results found.  ASSESSMENT:  Leukocytosis and thrombocytosis: - He was evaluated for elevated white count and platelet count. - He has  elevated white count since August 2022 and elevated platelet count since April 2017. - CT scan of the abdomen and pelvis on 09/30/2020 showed normal spleen size. - He does not have aquagenic pruritus or erythromelalgia's.  Does not have any recurrent infections.  He has a left buttock decub ulcer which is healing. - No B symptoms.  No prior history of thrombosis.  No history of MI or CVA. - Labs dated 10/17/2020 from [facility] shows white count 29.8, hemoglobin 11.5, MCV 90.  Platelet count was 1167.  Differential shows predominantly neutrophils, monocytes and basophils.  Social/family history: - He is currently at [facility] in [place] since 10/11/2020 for rehab. - Prior to that he used to live with his wife at home.  He worked in [employer]istic[employer] business.  He smoked pipe and cigars in the past. - No family history of leukemia.  Brother died of cancer, type unknown to the patient.   PLAN:  Leukocytosis and thrombocytosis: - We have reviewed lab results and scan results with the patient in detail. - He does not have any vasomotor symptoms or aquagenic pruritus.  No B symptoms.  No recurrent infections. - Differential diagnosis includes  myeloproliferative disorders including CML and polycythemia. - We will repeat CBC with differential and review smear.  We will also check LDH ESR and CRP.  We will send JAK2 [id number] with reflex mutation testing and BCR/ABL by FISH. - We will see him back in 2 to 3 weeks to discuss results and further plan.  2.  Mild normocytic anemia: - Hemoglobin between 11-12.  D03 and folic acid on 0/13/1438 was normal. - He has CKD which could be contributing to the mild anemia. - We will check ferritin, iron panel.   All questions were answered. The patient knows to call the clinic with any problems, questions or concerns.  Derek Jack, MD 11/09/20 9:06 AM  [place] [phone]   I, Thana Ates, am acting as a scribe for Dr. Derek Jack.  I, Derek Jack MD, have reviewed the above documentation for accuracy and completeness, and I agree with the above.

## 2020-11-09 ENCOUNTER — Inpatient Hospital Stay (HOSPITAL_COMMUNITY): Payer: Medicare Other | Attending: Hematology | Admitting: Hematology

## 2020-11-09 ENCOUNTER — Encounter (HOSPITAL_COMMUNITY): Payer: Self-pay | Admitting: Hematology

## 2020-11-09 ENCOUNTER — Inpatient Hospital Stay (HOSPITAL_COMMUNITY): Payer: Medicare Other

## 2020-11-09 ENCOUNTER — Other Ambulatory Visit: Payer: Self-pay

## 2020-11-09 DIAGNOSIS — D631 Anemia in chronic kidney disease: Secondary | ICD-10-CM | POA: Diagnosis not present

## 2020-11-09 DIAGNOSIS — D72829 Elevated white blood cell count, unspecified: Secondary | ICD-10-CM | POA: Insufficient documentation

## 2020-11-09 DIAGNOSIS — N189 Chronic kidney disease, unspecified: Secondary | ICD-10-CM | POA: Diagnosis not present

## 2020-11-09 DIAGNOSIS — D75839 Thrombocytosis, unspecified: Secondary | ICD-10-CM | POA: Diagnosis not present

## 2020-11-09 LAB — CBC WITH DIFFERENTIAL/PLATELET
Basophils Absolute: 0 10*3/uL (ref 0.0–0.1)
Basophils Relative: 0 %
Eosinophils Absolute: 1.7 10*3/uL — ABNORMAL HIGH (ref 0.0–0.5)
Eosinophils Relative: 5 %
HCT: 45.1 % (ref 39.0–52.0)
Hemoglobin: 14.4 g/dL (ref 13.0–17.0)
Lymphocytes Relative: 4 %
Lymphs Abs: 1.4 10*3/uL (ref 0.7–4.0)
MCH: 30.1 pg (ref 26.0–34.0)
MCHC: 31.9 g/dL (ref 30.0–36.0)
MCV: 94.2 fL (ref 80.0–100.0)
Monocytes Absolute: 0.3 10*3/uL (ref 0.1–1.0)
Monocytes Relative: 1 %
Neutro Abs: 31.1 10*3/uL — ABNORMAL HIGH (ref 1.7–7.7)
Neutrophils Relative %: 90 %
Platelets: 1644 10*3/uL (ref 150–400)
RBC: 4.79 MIL/uL (ref 4.22–5.81)
RDW: 17.8 % — ABNORMAL HIGH (ref 11.5–15.5)
WBC: 34.5 10*3/uL — ABNORMAL HIGH (ref 4.0–10.5)
nRBC: 0 % (ref 0.0–0.2)

## 2020-11-09 LAB — LACTATE DEHYDROGENASE: LDH: 173 U/L (ref 98–192)

## 2020-11-09 LAB — IRON AND TIBC
Iron: 70 ug/dL (ref 45–182)
Saturation Ratios: 27 % (ref 17.9–39.5)
TIBC: 262 ug/dL (ref 250–450)
UIBC: 192 ug/dL

## 2020-11-09 LAB — SEDIMENTATION RATE: Sed Rate: 21 mm/hr — ABNORMAL HIGH (ref 0–16)

## 2020-11-09 LAB — FERRITIN: Ferritin: 248 ng/mL (ref 24–336)

## 2020-11-09 LAB — C-REACTIVE PROTEIN: CRP: 0.6 mg/dL (ref <1.0)

## 2020-11-09 NOTE — Progress Notes (Signed)
CRITICAL VALUE ALERT Critical value received:  Platelets 1644 Date of notification:  11/09/20 Time of notification: 10:15 am  Critical value read back:  Yes.   Nurse who received alert:  T Wynona Luna MD notified time and response:  Message sent to Dr. Arlyss Repress, and M Madison LPN.

## 2020-11-09 NOTE — Patient Instructions (Addendum)
[place] at [facility] Discharge Instructions  You were seen and examined today by Dr. Delton Coombes. Dr. Delton Coombes is a hematologist meaning that he specializes in blood disorders. Dr. Delton Coombes discussed your past medical history, family history of blood disorders/cancers, and the events that led to you being here today.  You were referred to Dr. Delton Coombes following your recent hospitalization due to elevated white blood cells and elevated platelets. Dr. Delton Coombes has recommended additional lab work today in an attempt to identify the cause of this, including ruling out chronic leukemias.  Follow-up as scheduled.   Thank you for choosing [place] at [facility] to provide your oncology and hematology care.  To afford each patient quality time with our provider, please arrive at least 15 minutes before your scheduled appointment time.   If you have a lab appointment with the [place] please come in thru the Main Entrance and check in at the main information desk.  You need to re-schedule your appointment should you arrive 10 or more minutes late.  We strive to give you quality time with our providers, and arriving late affects you and other patients whose appointments are after yours.  Also, if you no show three or more times for appointments you may be dismissed from the clinic at the providers discretion.     Again, thank you for choosing [facility].  Our hope is that these requests will decrease the amount of time that you wait before being seen by our physicians.       _____________________________________________________________  Should you have questions after your visit to [facility], please contact our office at [phone] and follow the prompts.  Our office hours are 8:00 a.m. and 4:30 p.m. Monday - Friday.  Please note that voicemails left after 4:00 p.m. may not be returned until the following  business day.  We are closed weekends and major holidays.  You do have access to a nurse 24-7, just call the main number to the clinic [phone] and do not press any options, hold on the line and a nurse will answer the phone.    For prescription refill requests, have your pharmacy contact our office and allow 72 hours.    Due to Covid, you will need to wear a mask upon entering the hospital. If you do not have a mask, a mask will be given to you at the Main Entrance upon arrival. For doctor visits, patients may have 1 support person age 41 or older with them. For treatment visits, patients can not have anyone with them due to social distancing guidelines and our immunocompromised population.

## 2020-11-14 LAB — BCR-ABL1 FISH
Cells Analyzed: 200
Cells Counted: 200

## 2020-11-18 LAB — JAK2 V617F, W REFLEX TO CALR/E12/MPL

## 2020-11-24 NOTE — Progress Notes (Signed)
[place], [place] [address]   CLINIC:  Medical Oncology/Hematology  PCP:  Lavone Orn, MD [address] / [place] [address]  [phone]  REASON FOR VISIT:  Follow-up for polycythemia vera  PRIOR THERAPY: none  CURRENT THERAPY: under work-up  INTERVAL HISTORY:  [name], a 87 y.o. male, returns for routine follow-up for his polycythemia vera. Andris was last seen on 11/09/2020.  Today he reports feeling well. He denies history of DVT. He also denies itching after showers and changing colors of his fingertips. He spends most of the day laying down, and moves primarily with the wheelchair.   REVIEW OF SYSTEMS:  Review of Systems  Constitutional:  Positive for fatigue (40%). Negative for appetite change (75%).  Skin:  Negative for itching.  All other systems reviewed and are negative.  PAST MEDICAL/SURGICAL HISTORY:  Past Medical History:  Diagnosis Date   BPH (benign prostatic hyperplasia)    Coronary artery disease with hx of myocardial infarct w/o hx of CABG 2010   Dehydration    Diverticulosis    HOH (hard of hearing)    hearing aids   Hyperlipidemia    Hypertension    Impaired fasting glucose    Memory difficulty 10/19/2012   Memory loss    Mesenteric panniculitis ([place])    Retinal tear    Severe aortic stenosis 2010   S/P AVR-TISSUE VALVE   Past Surgical History:  Procedure Laterality Date   2 VESSEL CABG     AORTIC VALVE REPLACEMENT     BARTLE     CATARACT EXTRACTION, BILATERAL     RT 2010 LT 2014   TONSILLECTOMY      SOCIAL HISTORY:  Social History   Socioeconomic History   Marital status: Married    Spouse name: Not on file   Number of children: 3   Years of education: BA   Highest education level: Not on file  Occupational History   Occupation: Retired  Tobacco Use   Smoking status: Some Days    Types: Pipe   Smokeless tobacco: Never   Tobacco comments:    PIPE   Substance and Sexual Activity   Alcohol use: No    Comment: RARELY   Drug use: No   Sexual activity: Not on file  Other Topics Concern   Not on file  Social History Narrative   Patient lives at home with his wife [name])   Retired.   Education college B.S.   Right handed.   Caffeine - Decaf coffee   Social Determinants of Health   Financial Resource Strain: Not on file  Food Insecurity: Not on file  Transportation Needs: Not on file  Physical Activity: Not on file  Stress: Not on file  Social Connections: Not on file  Intimate Partner Violence: Not on file    FAMILY HISTORY:  Family History  Problem Relation Age of Onset   Stroke Father    Cancer Brother    Hypertension Brother    Heart attack Neg Hx     CURRENT MEDICATIONS:  Current Outpatient Medications  Medication Sig Dispense Refill   amLODipine (NORVASC) 10 MG tablet Take 1 tablet (10 mg total) by mouth daily. 30 tablet 3   amoxicillin (AMOXIL) 500 MG tablet TAKE 4 TABLETS BY MOUTH 1 HOUR BEFORE DENTAL PROCEDURE (Patient taking differently: Take 2,000 mg by mouth See admin instructions. Take 2,000 mg by mouth one hour before dental  procedures) 4 tablet 3   aspirin EC 81 MG tablet Take 81 mg by mouth daily.     atorvastatin (LIPITOR) 20 MG tablet Take 1 tablet (20 mg total) by mouth daily. 30 tablet 3   feeding supplement (ENSURE ENLIVE / ENSURE PLUS) LIQD Take 237 mLs by mouth 3 (three) times daily between meals. 237 mL 12   furosemide (LASIX) 20 MG tablet Take 1 tablet (20 mg total) by mouth daily. 30 tablet 3   latanoprost (XALATAN) 0.005 % ophthalmic solution Place 1 drop into both eyes at bedtime.     Multiple Vitamins-Minerals (MULTIVITAMIN WITH MINERALS) tablet Take 1 tablet by mouth daily.     acetaminophen (TYLENOL) 500 MG tablet Take 500 mg by mouth every 6 (six) hours as needed for moderate pain or headache. (Patient not taking: Reported on 11/26/2020)     No current facility-administered medications  for this visit.    ALLERGIES:  No Known Allergies  PHYSICAL EXAM:  Performance status (ECOG): 2 - Symptomatic, <50% confined to bed  Vitals:   11/26/20 1036  BP: (!) 88/53  Pulse: 85  Resp: 18  Temp: 97.7 F (36.5 C)  SpO2: 97%   Wt Readings from Last 3 Encounters:  10/07/20 134 lb 4.2 oz (60.9 kg)  09/30/20 147 lb 7.8 oz (66.9 kg)  09/29/20 156 lb 8.4 oz (71 kg)   Physical Exam Vitals reviewed.  Constitutional:      Appearance: Normal appearance.     Comments: In wheelchair  Cardiovascular:     Rate and Rhythm: Normal rate and regular rhythm.     Pulses: Normal pulses.     Heart sounds: Normal heart sounds.  Pulmonary:     Effort: Pulmonary effort is normal.     Breath sounds: Normal breath sounds.  Neurological:     General: No focal deficit present.     Mental Status: He is alert and oriented to person, place, and time.  Psychiatric:        Mood and Affect: Mood normal.        Behavior: Behavior normal.    LABORATORY DATA:  I have reviewed the labs as listed.  CBC Latest Ref Rng & Units 11/09/2020 10/08/2020 10/07/2020  WBC 4.0 - 10.5 K/uL 34.5(H) 26.6(H) 35.0(H)  Hemoglobin 13.0 - 17.0 g/dL 14.4 12.3(L) 13.6  Hematocrit 39.0 - 52.0 % 45.1 37.6(L) 40.8  Platelets 150 - 400 K/uL 1,644(HH) 1,315(HH) 1,319(HH)   CMP Latest Ref Rng & Units 10/08/2020 10/07/2020 10/05/2020  Glucose 70 - 99 mg/dL 152(H) 107(H) 105(H)  BUN 8 - 23 mg/dL 32(H) 26(H) 28(H)  Creatinine 0.61 - 1.24 mg/dL 1.52(H) 1.45(H) 1.66(H)  Sodium 135 - 145 mmol/L 140 140 142  Potassium 3.5 - 5.1 mmol/L 3.9 3.8 4.6  Chloride 98 - 111 mmol/L 109 106 110  CO2 22 - 32 mmol/L 23 23 23   Calcium 8.9 - 10.3 mg/dL 8.5(L) 8.8(L) 8.7(L)  Total Protein 6.5 - 8.1 g/dL - - -  Total Bilirubin 0.3 - 1.2 mg/dL - - -  Alkaline Phos 38 - 126 U/L - - -  AST 15 - 41 U/L - - -  ALT 0 - 44 U/L - - -      Component Value Date/Time   RBC 4.79 11/09/2020 0852   MCV 94.2 11/09/2020 0852   MCH 30.1 11/09/2020 0852    MCHC 31.9 11/09/2020 0852   RDW 17.8 (H) 11/09/2020 0852   LYMPHSABS 1.4 11/09/2020 0852   MONOABS 0.3 11/09/2020  4142   EOSABS 1.7 (H) 11/09/2020 0852   BASOSABS 0.0 11/09/2020 3953    DIAGNOSTIC IMAGING:  I have independently reviewed the scans and discussed with the patient. No results found.   ASSESSMENT:  Polycythemia vera: - He was evaluated for elevated white count and platelet count. - He has elevated white count since August 2022 and elevated platelet count since April 2017. - CT scan of the abdomen and pelvis on 09/30/2020 showed normal spleen size. - He does not have aquagenic pruritus or erythromelalgia's.  Does not have any recurrent infections.  He has a left buttock decub ulcer which is healing. - No B symptoms.  No prior history of thrombosis.  No history of MI or CVA. - Labs dated 10/17/2020 from [facility] shows white count 29.8, hemoglobin 11.5, MCV 90.  Platelet count was 1167.  Differential shows predominantly neutrophils, monocytes and basophils. - JAK2 V617F test positive on 11/09/2020. - Hydroxyurea 500 mg daily started on 11/26/2020.   Social/family history: - He is currently at [facility] in [place] since 10/11/2020 for rehab. - Prior to that he used to live with his wife at home.  He worked in [employer] business.  He smoked pipe and cigars in the past. - No family history of leukemia.  Brother died of cancer, type unknown to the patient.   PLAN:  Polycythemia vera: - We discussed his new diagnosis of polycythemia vera based on JAK2 V617F testing positive. - He does not have any history of thrombosis.  No aquagenic pruritus or other vasomotor symptoms. - He is already on aspirin 81 mg daily. - We talked about initiating him on hydroxyurea to minimize thrombosis and stroke risk. - We talked about side effects in detail.  We will start him at low-dose 500 mg daily.  I have also given prescription for Compazine to be taken as needed. -  RTC 4 weeks with labs.  2.  Mild normocytic anemia: - Repeat CBC showed hemoglobin improved to 14.4.  Ferritin was 248 and percent saturation 27.  Vitamin U02 and folic acid were normal.  Orders placed this encounter:  No orders of the defined types were placed in this encounter.    Derek Jack, MD [place] [phone]   I, Thana Ates, am acting as a scribe for Dr. Derek Jack.  I, Derek Jack MD, have reviewed the above documentation for accuracy and completeness, and I agree with the above.

## 2020-11-26 ENCOUNTER — Other Ambulatory Visit: Payer: Self-pay

## 2020-11-26 ENCOUNTER — Inpatient Hospital Stay (HOSPITAL_COMMUNITY): Payer: Medicare Other | Attending: Hematology | Admitting: Hematology

## 2020-11-26 VITALS — BP 88/53 | HR 85 | Temp 97.7°F | Resp 18

## 2020-11-26 DIAGNOSIS — D649 Anemia, unspecified: Secondary | ICD-10-CM | POA: Diagnosis not present

## 2020-11-26 DIAGNOSIS — D72829 Elevated white blood cell count, unspecified: Secondary | ICD-10-CM

## 2020-11-26 DIAGNOSIS — Z7982 Long term (current) use of aspirin: Secondary | ICD-10-CM | POA: Insufficient documentation

## 2020-11-26 DIAGNOSIS — D75839 Thrombocytosis, unspecified: Secondary | ICD-10-CM

## 2020-11-26 DIAGNOSIS — Z809 Family history of malignant neoplasm, unspecified: Secondary | ICD-10-CM | POA: Diagnosis not present

## 2020-11-26 DIAGNOSIS — D45 Polycythemia vera: Secondary | ICD-10-CM | POA: Insufficient documentation

## 2020-11-26 DIAGNOSIS — I10 Essential (primary) hypertension: Secondary | ICD-10-CM | POA: Insufficient documentation

## 2020-11-26 MED ORDER — HYDROXYUREA 500 MG PO CAPS: 500.0000 mg | ORAL_CAPSULE | Freq: Every day | ORAL | 2 refills | Status: AC

## 2020-11-26 MED ORDER — PROCHLORPERAZINE MALEATE 10 MG PO TABS: 10.0000 mg | ORAL_TABLET | Freq: Four times a day (QID) | ORAL | 2 refills | Status: AC | PRN

## 2020-11-26 NOTE — Patient Instructions (Signed)
[place] at [facility] Discharge Instructions  You were seen and examined today by Dr. Delton Coombes.  Your recent lab work was positive for [place]. This means that your bone marrow creates too much blood even when it is not needed, and this increases your chances of blood clots and strokes.  Dr. Delton Coombes has recommended Hydroxyurea. It can cause nausea, take anti-nausea medications as needed.   Thank you for choosing [place] at [facility] to provide your oncology and hematology care.  To afford each patient quality time with our provider, please arrive at least 15 minutes before your scheduled appointment time.   If you have a lab appointment with the [place] please come in thru the Main Entrance and check in at the main information desk.  You need to re-schedule your appointment should you arrive 10 or more minutes late.  We strive to give you quality time with our providers, and arriving late affects you and other patients whose appointments are after yours.  Also, if you no show three or more times for appointments you may be dismissed from the clinic at the providers discretion.     Again, thank you for choosing [facility].  Our hope is that these requests will decrease the amount of time that you wait before being seen by our physicians.       _____________________________________________________________  Should you have questions after your visit to [facility], please contact our office at [phone] and follow the prompts.  Our office hours are 8:00 a.m. and 4:30 p.m. Monday - Friday.  Please note that voicemails left after 4:00 p.m. may not be returned until the following business day.  We are closed weekends and major holidays.  You do have access to a nurse 24-7, just call the main number to the clinic [phone] and do not press any options, hold on the line and a nurse will answer the phone.     For prescription refill requests, have your pharmacy contact our office and allow 72 hours.    Due to Covid, you will need to wear a mask upon entering the hospital. If you do not have a mask, a mask will be given to you at the Main Entrance upon arrival. For doctor visits, patients may have 1 support person age 54 or older with them. For treatment visits, patients can not have anyone with them due to social distancing guidelines and our immunocompromised population.

## 2020-12-22 NOTE — Progress Notes (Signed)
[place], [place] [address]   CLINIC:  Medical Oncology/Hematology  PCP:  Lavone Orn, MD [address] [place] [address] [phone]   REASON FOR VISIT:  Follow-up for polycythemia vera  PRIOR THERAPY: None  CURRENT THERAPY: Hydrea 500 mg daily  INTERVAL HISTORY:  [name] 87 y.o. male returns for routine follow-up of his polycythemia vera.  He was last seen by Dr. Delton Coombes on 11/26/2020.  He was started on Hydrea 500 mg daily on 11/26/2020.  At today's visit, he reports feeling tired.  He is currently residing at the [facility] in [name], and is accompanied today by staff member (activities assistant).  Patient offers limited history, but he does seem to have good understanding of what is going on and is able to answer questions appropriately.  Per the staff member who accompanied him to the visit, he seems to be more or less his normal self.  He is tolerating Hydrea well.  Patient denies cutaneous ulcers and nonhealing skin wounds.  He reports that his left buttocks decubitus ulcer has healed.  (He is unable to stand from wheelchair for this to be checked on exam.)  No complaints of mouth sores.  Denies gastrointestinal symptoms such as gastritis, nausea, vomiting, diarrhea.  Energy levels have been low, but this has been ongoing since he was admitted to SNF in September 2022.    Patient admits to some chronic dizziness, blurry vision, and peripheral neuropathy.  He denies any current signs or symptoms of blood clots, no previous history of DVT/PE.  He denies any aquagenic pruritus, Raynaud's phenomenon, or erythromelalgia.  He admits to mild night sweats, but denies fever or chills.  He reports significant unintentional weight loss, has lost about 30 pounds over the past 3 months.  Over the past year, he has lost a total of 50 pounds.  Most of his weight loss occurred after being admitted to SNF in September  2022.  He reports that he "eats pretty good," but also tells me that he does not like to eat the pured diet that he is being given.  He has 25% energy and 50% appetite. He endorses that he is maintaining a stable weight.   REVIEW OF SYSTEMS:  Review of Systems  Constitutional:  Positive for appetite change, diaphoresis (mild to moderate night sweats, non-drenching), fatigue and unexpected weight change. Negative for chills and fever.  HENT:   Negative for lump/mass and nosebleeds.   Eyes:  Positive for eye problems (chronic blurry vision).  Respiratory:  Negative for cough, hemoptysis and shortness of breath.   Cardiovascular:  Negative for chest pain, leg swelling and palpitations.  Gastrointestinal:  Negative for abdominal pain, blood in stool, constipation, diarrhea, nausea and vomiting.  Genitourinary:  Negative for hematuria.   Skin: Negative.   Neurological:  Positive for numbness (chronic peripheral neuropathy). Negative for dizziness, headaches and light-headedness.  Hematological:  Does not bruise/bleed easily.     PAST MEDICAL/SURGICAL HISTORY:  Past Medical History:  Diagnosis Date   BPH (benign prostatic hyperplasia)    Coronary artery disease with hx of myocardial infarct w/o hx of CABG 2010   Dehydration    Diverticulosis    HOH (hard of hearing)    hearing aids   Hyperlipidemia    Hypertension    Impaired fasting glucose    Memory difficulty 10/19/2012   Memory loss    Mesenteric panniculitis ([place])    Retinal tear  Severe aortic stenosis 2010   S/P AVR-TISSUE VALVE   Past Surgical History:  Procedure Laterality Date   2 VESSEL CABG     AORTIC VALVE REPLACEMENT     BARTLE     CATARACT EXTRACTION, BILATERAL     RT 2010 LT 2014   TONSILLECTOMY       SOCIAL HISTORY:  Social History   Socioeconomic History   Marital status: Married    Spouse name: Not on file   Number of children: 3   Years of education: BA   Highest education level: Not on file   Occupational History   Occupation: Retired  Tobacco Use   Smoking status: Some Days    Types: Pipe   Smokeless tobacco: Never   Tobacco comments:    PIPE  Substance and Sexual Activity   Alcohol use: No    Comment: RARELY   Drug use: No   Sexual activity: Not on file  Other Topics Concern   Not on file  Social History Narrative   Patient lives at home with his wife [name])   Retired.   Education college B.S.   Right handed.   Caffeine - Decaf coffee   Social Determinants of Health   Financial Resource Strain: Not on file  Food Insecurity: Not on file  Transportation Needs: Not on file  Physical Activity: Not on file  Stress: Not on file  Social Connections: Not on file  Intimate Partner Violence: Not on file    FAMILY HISTORY:  Family History  Problem Relation Age of Onset   Stroke Father    Cancer Brother    Hypertension Brother    Heart attack Neg Hx     CURRENT MEDICATIONS:  Outpatient Encounter Medications as of 12/24/2020  Medication Sig   acetaminophen (TYLENOL) 500 MG tablet Take 500 mg by mouth every 6 (six) hours as needed for moderate pain or headache. (Patient not taking: Reported on 11/26/2020)   amLODipine (NORVASC) 10 MG tablet Take 1 tablet (10 mg total) by mouth daily.   amoxicillin (AMOXIL) 500 MG tablet TAKE 4 TABLETS BY MOUTH 1 HOUR BEFORE DENTAL PROCEDURE (Patient taking differently: Take 2,000 mg by mouth See admin instructions. Take 2,000 mg by mouth one hour before dental procedures)   aspirin EC 81 MG tablet Take 81 mg by mouth daily.   atorvastatin (LIPITOR) 20 MG tablet Take 1 tablet (20 mg total) by mouth daily.   feeding supplement (ENSURE ENLIVE / ENSURE PLUS) LIQD Take 237 mLs by mouth 3 (three) times daily between meals.   furosemide (LASIX) 20 MG tablet Take 1 tablet (20 mg total) by mouth daily.   hydroxyurea (HYDREA) 500 MG capsule Take 1 capsule (500 mg total) by mouth daily. May take with food to minimize GI side effects.    latanoprost (XALATAN) 0.005 % ophthalmic solution Place 1 drop into both eyes at bedtime.   Multiple Vitamins-Minerals (MULTIVITAMIN WITH MINERALS) tablet Take 1 tablet by mouth daily.   prochlorperazine (COMPAZINE) 10 MG tablet Take 1 tablet (10 mg total) by mouth every 6 (six) hours as needed for nausea or vomiting.   No facility-administered encounter medications on file as of 12/24/2020.    ALLERGIES:  No Known Allergies   PHYSICAL EXAM:  ECOG PERFORMANCE STATUS: 3 - Symptomatic, >50% confined to bed  There were no vitals filed for this visit. There were no vitals filed for this visit. Physical Exam Constitutional:      Appearance: He is cachectic.  Comments: Thin, cachectic, with noted muscle and fat-wasting on exam  HENT:     Head: Normocephalic and atraumatic.     Mouth/Throat:     Mouth: Mucous membranes are moist.  Eyes:     Extraocular Movements: Extraocular movements intact.     Pupils: Pupils are equal, round, and reactive to light.  Cardiovascular:     Rate and Rhythm: Normal rate and regular rhythm.     Pulses: Normal pulses.     Heart sounds: Normal heart sounds.  Pulmonary:     Effort: Pulmonary effort is normal.     Breath sounds: Decreased breath sounds present.     Comments: Diminished breath sounds at lung bases Abdominal:     General: Bowel sounds are normal.     Palpations: Abdomen is soft.     Tenderness: There is no abdominal tenderness.  Musculoskeletal:        General: No swelling.     Right lower leg: No edema.     Left lower leg: No edema.  Lymphadenopathy:     Cervical: No cervical adenopathy.  Skin:    General: Skin is warm and dry.  Neurological:     General: No focal deficit present.     Mental Status: He is oriented to person, place, and time and easily aroused. He is lethargic.  Psychiatric:        Mood and Affect: Mood normal.        Behavior: Behavior normal.     LABORATORY DATA:  I have reviewed the labs as listed.   CBC    Component Value Date/Time   WBC 34.5 (H) 11/09/2020 0852   RBC 4.79 11/09/2020 0852   HGB 14.4 11/09/2020 0852   HCT 45.1 11/09/2020 0852   PLT 1,644 (HH) 11/09/2020 0852   MCV 94.2 11/09/2020 0852   MCH 30.1 11/09/2020 0852   MCHC 31.9 11/09/2020 0852   RDW 17.8 (H) 11/09/2020 0852   LYMPHSABS 1.4 11/09/2020 0852   MONOABS 0.3 11/09/2020 0852   EOSABS 1.7 (H) 11/09/2020 0852   BASOSABS 0.0 11/09/2020 0852   CMP Latest Ref Rng & Units 10/08/2020 10/07/2020 10/05/2020  Glucose 70 - 99 mg/dL 152(H) 107(H) 105(H)  BUN 8 - 23 mg/dL 32(H) 26(H) 28(H)  Creatinine 0.61 - 1.24 mg/dL 1.52(H) 1.45(H) 1.66(H)  Sodium 135 - 145 mmol/L 140 140 142  Potassium 3.5 - 5.1 mmol/L 3.9 3.8 4.6  Chloride 98 - 111 mmol/L 109 106 110  CO2 22 - 32 mmol/L 23 23 23   Calcium 8.9 - 10.3 mg/dL 8.5(L) 8.8(L) 8.7(L)  Total Protein 6.5 - 8.1 g/dL - - -  Total Bilirubin 0.3 - 1.2 mg/dL - - -  Alkaline Phos 38 - 126 U/L - - -  AST 15 - 41 U/L - - -  ALT 0 - 44 U/L - - -    DIAGNOSTIC IMAGING:  I have independently reviewed the relevant imaging and discussed with the patient.  ASSESSMENT & PLAN: 1.  Polycythemia vera: - He was evaluated for elevated white count and platelet count. - He has elevated white count since August 2022 and elevated platelet count since April 2017. - CT scan of the abdomen and pelvis on 09/30/2020 showed normal spleen size. - He does not have aquagenic pruritus or erythromelalgia's.  Does not have any recurrent infections.    - He has a left buttock decub ulcer which he reports has healed (unable to stand on exam for this to be evaluated in  clinic today).    - Reports fatigue and mild to moderate night sweats.  He denies any unexplained fever or chills. - He has had significant weight loss over the past 2 to 3 months, further discussed below. - No prior history of thrombosis.  No history of MI or CVA. - Labs dated 10/17/2020 from [facility] shows white count 29.8, hemoglobin  11.5, MCV 90.  Platelet count was 1167.  Differential shows predominantly neutrophils, monocytes and basophils. - JAK2 V617F test positive on 11/09/2020. - Hydroxyurea 500 mg daily started on 11/26/2020, prescription for Compazine given for nausea.  Tolerating well without adverse effects. - He takes aspirin 81 mg daily - Labs today (12/24/2020): WBC 27.9/ANC 23.9 (slightly down from previous), Hgb 12.0/MCV 97.9.  Platelets 888 (down from 1644).  LDH normal, uric acid normal.  CMP shows chronic CKD stage IIIb, otherwise unremarkable. - PLAN: Labs are improved, but not yet at goal.  Hesitant to increase dose of Hydrea due to poor appetite and weight loss. - Continue Hydrea 500 mg daily. - We will repeat labs again in 4 weeks with repeat office visit at that time.  2.  Mild normocytic anemia: - Patient initially had mild normocytic anemia, but repeat CBC showed hemoglobin improved to 14.4.  Ferritin was 248 and percent saturation 27.  Vitamin G86 and folic acid were normal. - Most recent CBC (12/24/2020) shows Hgb 12.0/MCV 97.9.  Suspect mild myelosuppression from Hydrea. - PLAN: We will continue with same dose of Hydrea 500 mg daily, but will consider adjusting if Hgb drops to < 10.0.  3.  Unintentional weight loss and severe malnutrition - Patient has been eating less since he was admitted to SNF in September 2022 - Weight today (12/24/2020) is 180 pounds.  Body weight has decreased 19.4% (26 pounds) from 134 pounds 4.2 ounces on 10/07/2020. - Physical exam shows severe fat depletion and severe muscle depletion.  Patient appears cachectic. - Patient seen by registered dietitian during his clinic visit today, who recommended that patient requests food preferences at SNF, and to drink Ensure Plus or equivalent twice daily.  Recommends SLP evaluation to determine safe advancement of diet to promote oral intake. - PLAN: Continue to follow weights at follow-up visit. - Continue follow-up with dietitian. -  We will defer additional work-up (such as CT imaging to look for occult malignancy) due to high suspicion that this is nutritional weight loss in nature.  Additionally, due to patient's advanced age and frailty, he would not be a candidate for treatment of any malignancies if they were found. - Given his significant weight loss, clinical picture, and functional decline, he may be developing a failure to thrive sort of syndrome.  Referral has been sent for palliative care.  4. Social/family history: - He is currently at [facility] in [place] since 10/11/2020 for rehab. - Prior to that he used to live with his wife at home.  He worked in [employer] business.  He smoked pipe and cigars in the past. - No family history of leukemia.  Brother died of cancer, type unknown to the patient.   PLAN SUMMARY & DISPOSITION: Labs and RTC in 4 weeks  All questions were answered. The patient knows to call the clinic with any problems, questions or concerns.  Medical decision making: Moderate  Time spent on visit: I spent 20 minutes counseling the patient face to face. The total time spent in the appointment was 30 minutes and more than 50% was on  counseling.   Harriett Rush, PA-C  12/24/2020 4:24 PM

## 2020-12-24 ENCOUNTER — Inpatient Hospital Stay (HOSPITAL_COMMUNITY): Payer: Medicare Other | Attending: Hematology

## 2020-12-24 ENCOUNTER — Ambulatory Visit (HOSPITAL_COMMUNITY): Payer: Medicare Other | Admitting: Dietician

## 2020-12-24 ENCOUNTER — Other Ambulatory Visit: Payer: Self-pay

## 2020-12-24 ENCOUNTER — Inpatient Hospital Stay (HOSPITAL_BASED_OUTPATIENT_CLINIC_OR_DEPARTMENT_OTHER): Payer: Medicare Other | Admitting: Physician Assistant

## 2020-12-24 ENCOUNTER — Ambulatory Visit (HOSPITAL_COMMUNITY): Payer: Medicare Other | Admitting: Hematology

## 2020-12-24 VITALS — BP 108/58 | HR 72 | Temp 96.0°F | Resp 18 | Ht 63.39 in | Wt 108.0 lb

## 2020-12-24 DIAGNOSIS — F17210 Nicotine dependence, cigarettes, uncomplicated: Secondary | ICD-10-CM | POA: Insufficient documentation

## 2020-12-24 DIAGNOSIS — H538 Other visual disturbances: Secondary | ICD-10-CM | POA: Diagnosis not present

## 2020-12-24 DIAGNOSIS — G629 Polyneuropathy, unspecified: Secondary | ICD-10-CM | POA: Insufficient documentation

## 2020-12-24 DIAGNOSIS — D45 Polycythemia vera: Secondary | ICD-10-CM | POA: Insufficient documentation

## 2020-12-24 DIAGNOSIS — I129 Hypertensive chronic kidney disease with stage 1 through stage 4 chronic kidney disease, or unspecified chronic kidney disease: Secondary | ICD-10-CM | POA: Diagnosis not present

## 2020-12-24 DIAGNOSIS — Z809 Family history of malignant neoplasm, unspecified: Secondary | ICD-10-CM | POA: Insufficient documentation

## 2020-12-24 DIAGNOSIS — R634 Abnormal weight loss: Secondary | ICD-10-CM

## 2020-12-24 DIAGNOSIS — N1832 Chronic kidney disease, stage 3b: Secondary | ICD-10-CM | POA: Insufficient documentation

## 2020-12-24 DIAGNOSIS — R61 Generalized hyperhidrosis: Secondary | ICD-10-CM | POA: Diagnosis not present

## 2020-12-24 DIAGNOSIS — R42 Dizziness and giddiness: Secondary | ICD-10-CM | POA: Diagnosis not present

## 2020-12-24 LAB — COMPREHENSIVE METABOLIC PANEL
ALT: 15 U/L (ref 0–44)
AST: 16 U/L (ref 15–41)
Albumin: 3.5 g/dL (ref 3.5–5.0)
Alkaline Phosphatase: 88 U/L (ref 38–126)
Anion gap: 5 (ref 5–15)
BUN: 35 mg/dL — ABNORMAL HIGH (ref 8–23)
CO2: 27 mmol/L (ref 22–32)
Calcium: 8.8 mg/dL — ABNORMAL LOW (ref 8.9–10.3)
Chloride: 105 mmol/L (ref 98–111)
Creatinine, Ser: 1.73 mg/dL — ABNORMAL HIGH (ref 0.61–1.24)
GFR, Estimated: 37 mL/min — ABNORMAL LOW (ref >60)
Glucose, Bld: 129 mg/dL — ABNORMAL HIGH (ref 70–99)
Potassium: 4.4 mmol/L (ref 3.5–5.1)
Sodium: 137 mmol/L (ref 135–145)
Total Bilirubin: 1 mg/dL (ref 0.3–1.2)
Total Protein: 7.1 g/dL (ref 6.5–8.1)

## 2020-12-24 LAB — CBC WITH DIFFERENTIAL/PLATELET
Abs Immature Granulocytes: 0.21 10*3/uL — ABNORMAL HIGH (ref 0.00–0.07)
Basophils Absolute: 0.8 10*3/uL — ABNORMAL HIGH (ref 0.0–0.1)
Basophils Relative: 3 %
Eosinophils Absolute: 0.9 10*3/uL — ABNORMAL HIGH (ref 0.0–0.5)
Eosinophils Relative: 3 %
HCT: 36.6 % — ABNORMAL LOW (ref 39.0–52.0)
Hemoglobin: 12 g/dL — ABNORMAL LOW (ref 13.0–17.0)
Immature Granulocytes: 1 %
Lymphocytes Relative: 5 %
Lymphs Abs: 1.3 10*3/uL (ref 0.7–4.0)
MCH: 32.1 pg (ref 26.0–34.0)
MCHC: 32.8 g/dL (ref 30.0–36.0)
MCV: 97.9 fL (ref 80.0–100.0)
Monocytes Absolute: 0.8 10*3/uL (ref 0.1–1.0)
Monocytes Relative: 3 %
Neutro Abs: 23.9 10*3/uL — ABNORMAL HIGH (ref 1.7–7.7)
Neutrophils Relative %: 85 %
Platelets: 888 10*3/uL — ABNORMAL HIGH (ref 150–400)
RBC: 3.74 MIL/uL — ABNORMAL LOW (ref 4.22–5.81)
RDW: 19.6 % — ABNORMAL HIGH (ref 11.5–15.5)
WBC: 27.9 10*3/uL — ABNORMAL HIGH (ref 4.0–10.5)
nRBC: 0 % (ref 0.0–0.2)

## 2020-12-24 LAB — URIC ACID: Uric Acid, Serum: 7.4 mg/dL (ref 3.7–8.6)

## 2020-12-24 LAB — LACTATE DEHYDROGENASE: LDH: 126 U/L (ref 98–192)

## 2020-12-24 NOTE — Progress Notes (Signed)
Nutrition Assessment   Reason for Assessment: Provider request  ASSESSMENT: 87 year old male with polycythemia vera. He is a current resident of [facility] in [place] (since 10/11/20 for rehab)  Past medical history includes BPH, CAD, HOH, HLD, HTN, mesenteric panniculitis, severe aortic stenosis s/p AVR  Met with patient and activities coordinator from facility in clinic today. Patient reports appetite is ok, typically eats 2 meals daily at facility. Patient eats bowl of oatmeal or grits, glass of juice for breakfast. He does not usually eat again until dinner meal. Patient reports puree diet and not liking foods on his tray. Patient is "not often" given a meal choice. Patient reports he is not seen by SLP at facility. He does not wear dentures, reports a few broken teeth but denies chewing/swallowing difficulty. Patient is not drinking Ensure supplements at facility. Per his company today, there is Ensure at facility. Patient reports he "think about" asking about these when he gets back. Patient denies nausea, vomiting, constipation, diarrhea.   Nutrition Focused Physical Exam:  Severe fat depletion - orbital, buccal region Severe muscle depletion - temple, clavicle, clavicle/acromion, dorsal hand regions   Medications: Lasix, MVI, hydroxyurea  Labs: Glucose 129, BUN 35, Cr 1.73   Anthropometrics: Weights have decreased 19.4% (26 lbs) from 134 lb 4.2 oz on 10/07/20. Weights have decreased 31.6% (50 lbs) in the last year;  significant  Height: 5' 3.39" Weight: 108 lb 0.4 oz (12/24/20) UBW: 158 lb (01/04/20) BMI: 18.90   NUTRITION DIAGNOSIS: Patient meets criteria for severe malnutrition related to social/environmental circumstances as evidenced by oral intake meeting <75% of estimated needs >1 month secondary to dislike of facility food/puree texture per dietary recall, severe fat and muscle depletion on exam, significant 19.4% (26 lb) weight loss since 9/22 admission to rehab  facility   INTERVENTION:  Encouraged pt to request food preferences  Recommend Ensure Plus/equivalent BID Consider SLP evaluation to determine safe advancement of diet to promote oral intake Discussed recommendations with activities coordinator accompanying patient to visit today    MONITORING, EVALUATION, GOAL: Patient will tolerate increased calories and protein to promote stable weight   Next Visit: To be scheduled

## 2020-12-24 NOTE — Patient Instructions (Signed)
[place] at [facility] Discharge Instructions  You were seen today by Tarri Abernethy PA-C for your polycythemia vera.  Your blood counts have improved on your current dose of Hydrea.  Recommend that you continue Hydrea 500 mg daily.  Due to your significant unintentional weight loss (30 pounds in 2 months), I recommend that you see the nutritionist/dietitian at the [facility].  I also recommend that you drink 2-3 Ensure nutritional shakes (or similar product) every day.  You should have your weight checked daily at the [facility].  I also recommend that due to your overall decline in health you have consultation with palliative care and/or hospice services to discuss your overall health, comfort, and goals of care.  LABS: Return in 4 weeks for repeat labs  MEDICATIONS: Continue Hydrea 500 mg daily  FOLLOW-UP APPOINTMENT: Office visit in 4 weeks   Thank you for choosing [place] at [facility] to provide your oncology and hematology care.  To afford each patient quality time with our provider, please arrive at least 15 minutes before your scheduled appointment time.   If you have a lab appointment with the [facility] please come in thru the Main Entrance and check in at the main information desk.  You need to re-schedule your appointment should you arrive 10 or more minutes late.  We strive to give you quality time with our providers, and arriving late affects you and other patients whose appointments are after yours.  Also, if you no show three or more times for appointments you may be dismissed from the clinic at the providers discretion.     Again, thank you for choosing [facility]  Our hope is that these requests will decrease the amount of time that you wait before being seen by our physicians.       _____________________________________________________________  Should you have questions after your visit to [facility], please contact our office at [phone] and follow the prompts.  Our office hours are 8:00 a.m. and 4:30 p.m. Monday - Friday.  Please note that voicemails left after 4:00 p.m. may not be returned until the following business day.  We are closed weekends and major holidays.  You do have access to a nurse 24-7, just call the main number to the clinic [phone] and do not press any options, hold on the line and a nurse will answer the phone.    For prescription refill requests, have your pharmacy contact our office and allow 72 hours.    Due to Covid, you will need to wear a mask upon entering the hospital. If you do not have a mask, a mask will be given to you at the Main Entrance upon arrival. For doctor visits, patients may have 1 support person age 60 or older with them. For treatment visits, patients can not have anyone with them due to social distancing guidelines and our immunocompromised population.

## 2021-01-20 DEATH — deceased

## 2021-01-24 ENCOUNTER — Other Ambulatory Visit (HOSPITAL_COMMUNITY): Payer: Medicare Other

## 2021-01-24 ENCOUNTER — Ambulatory Visit (HOSPITAL_COMMUNITY): Payer: Medicare Other | Admitting: Physician Assistant

## 2021-01-24 ENCOUNTER — Encounter (HOSPITAL_COMMUNITY): Payer: Medicare Other | Admitting: Dietician

## 2021-02-07 ENCOUNTER — Ambulatory Visit (HOSPITAL_COMMUNITY): Payer: Medicare Other | Admitting: Physician Assistant

## 2021-02-07 ENCOUNTER — Encounter (HOSPITAL_COMMUNITY): Payer: Medicare Other | Admitting: Dietician

## 2021-02-07 ENCOUNTER — Other Ambulatory Visit (HOSPITAL_COMMUNITY): Payer: Medicare Other

## 2021-12-23 IMAGING — CR DG HIP (WITH OR WITHOUT PELVIS) 2-3V*L*
3 series · 3 of 3 positions shown · non-contrast
Comparison: None.

CLINICAL DATA: Left hip pain after fall today.

EXAM:
DG HIP (WITH OR WITHOUT PELVIS) 2-3V LEFT

[pelvis ap]
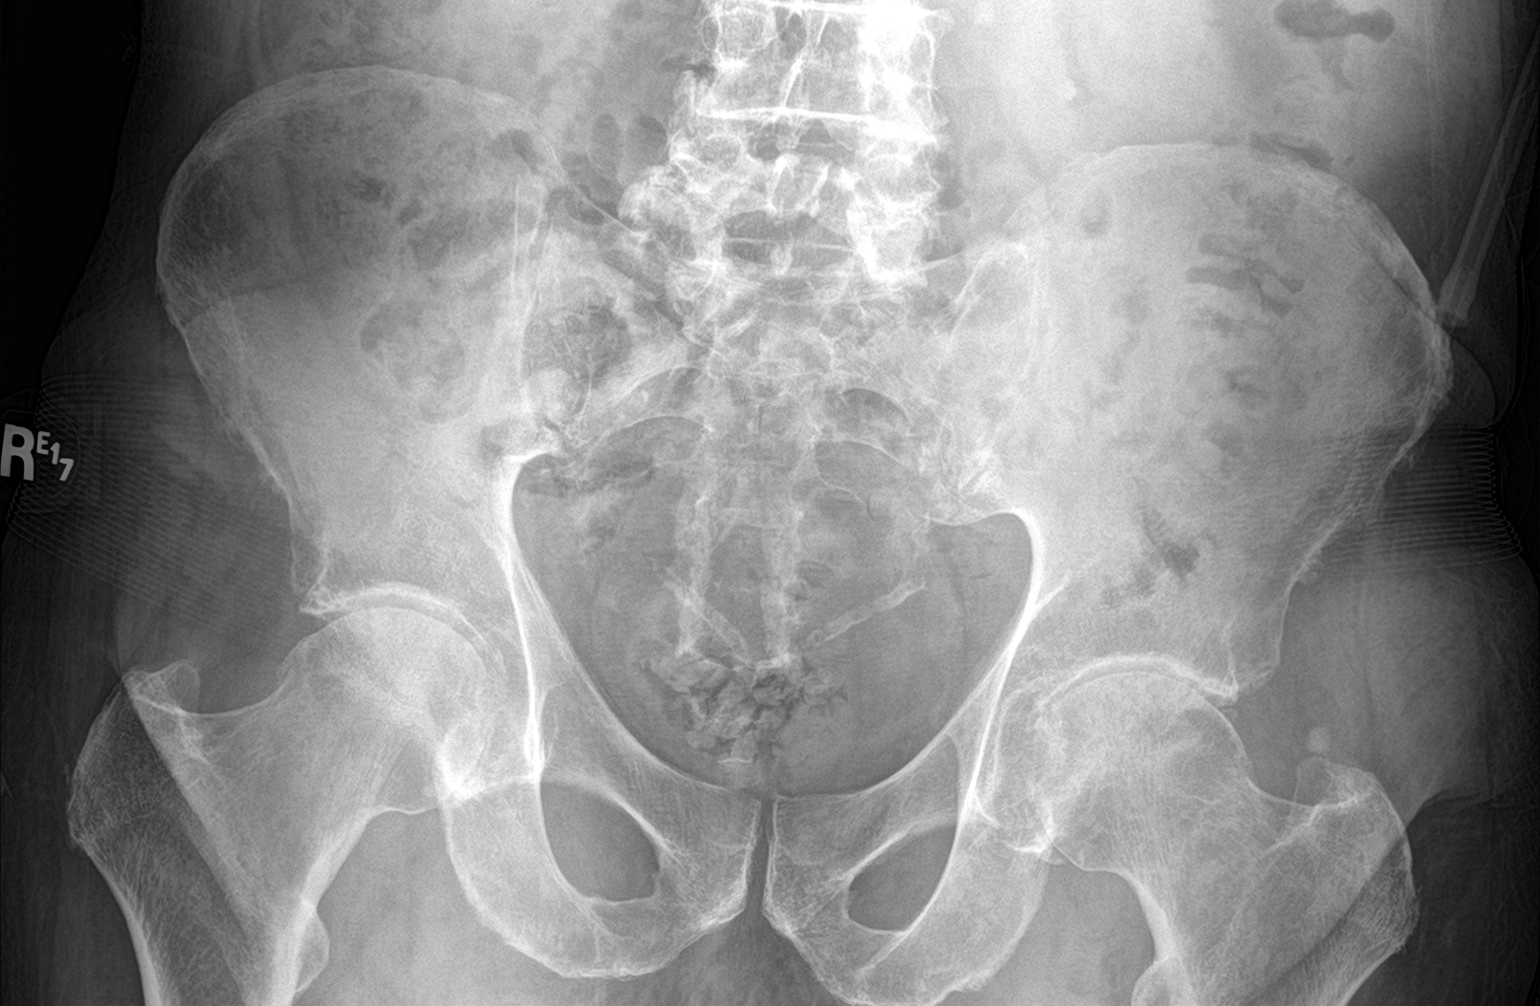

[hip ap]
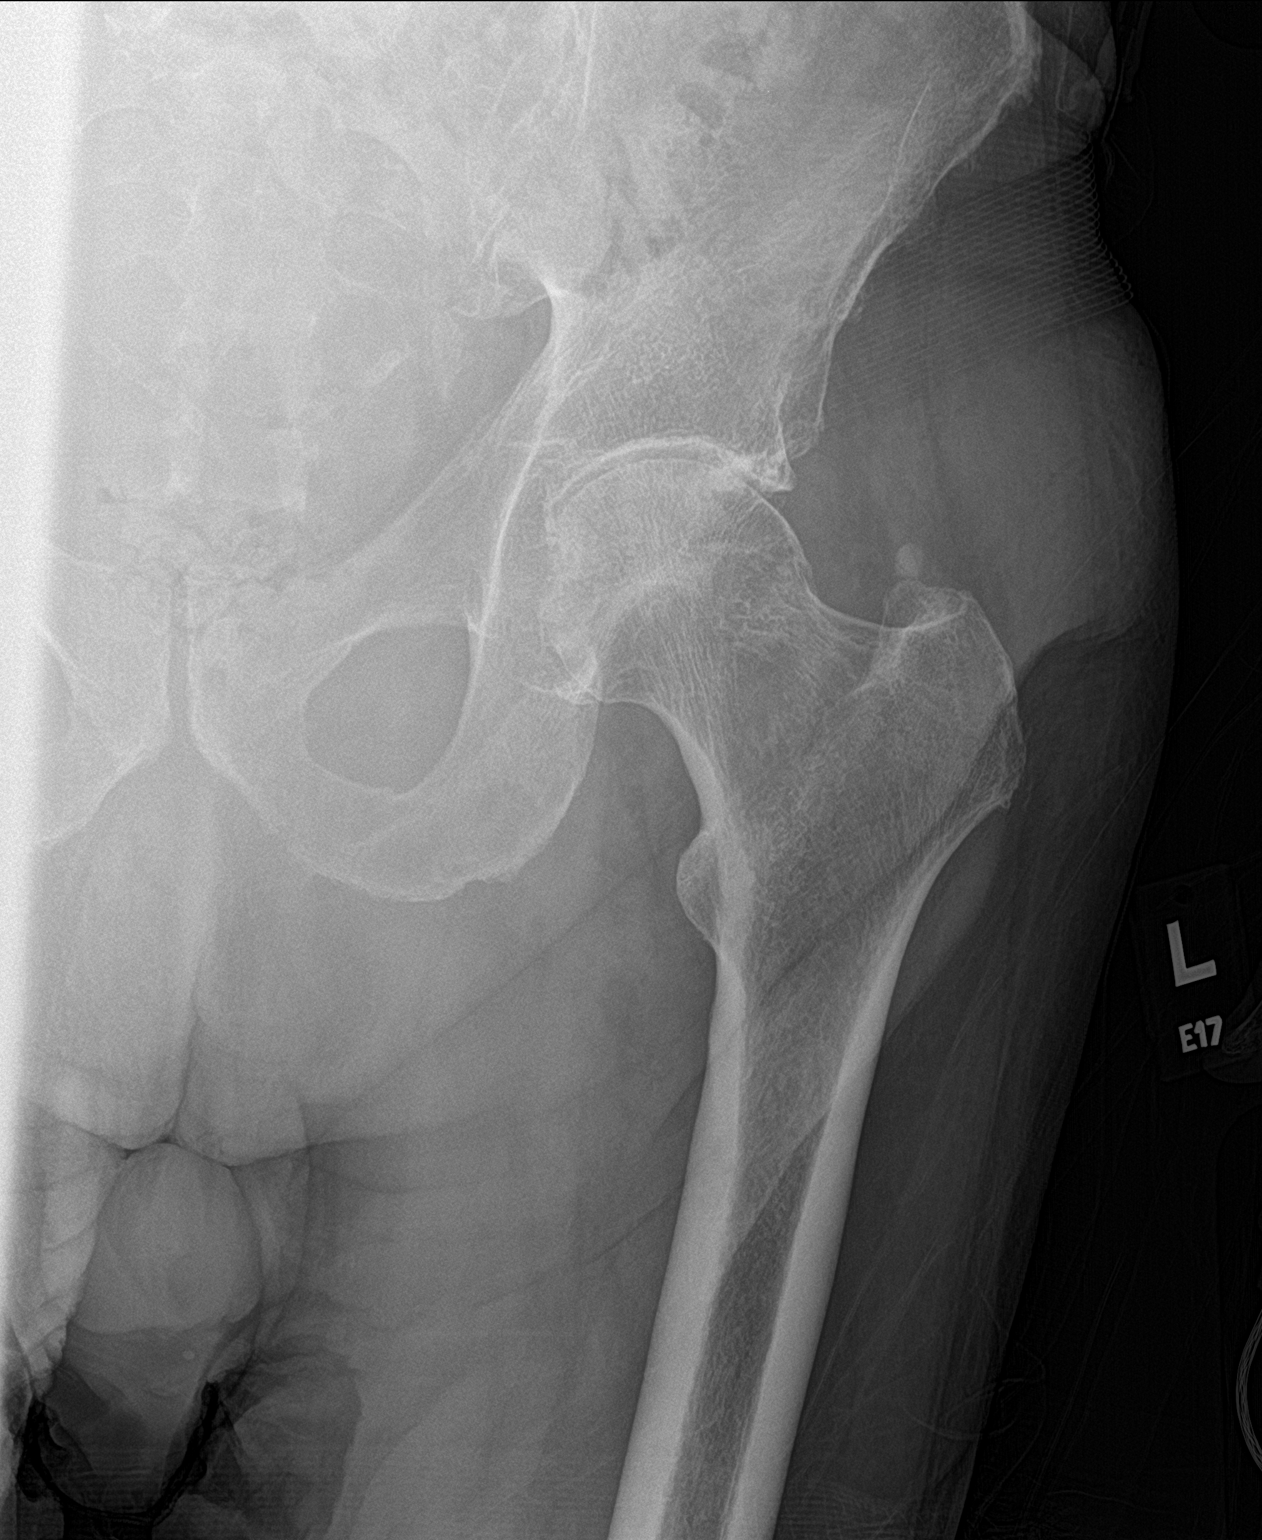

[hip lat]
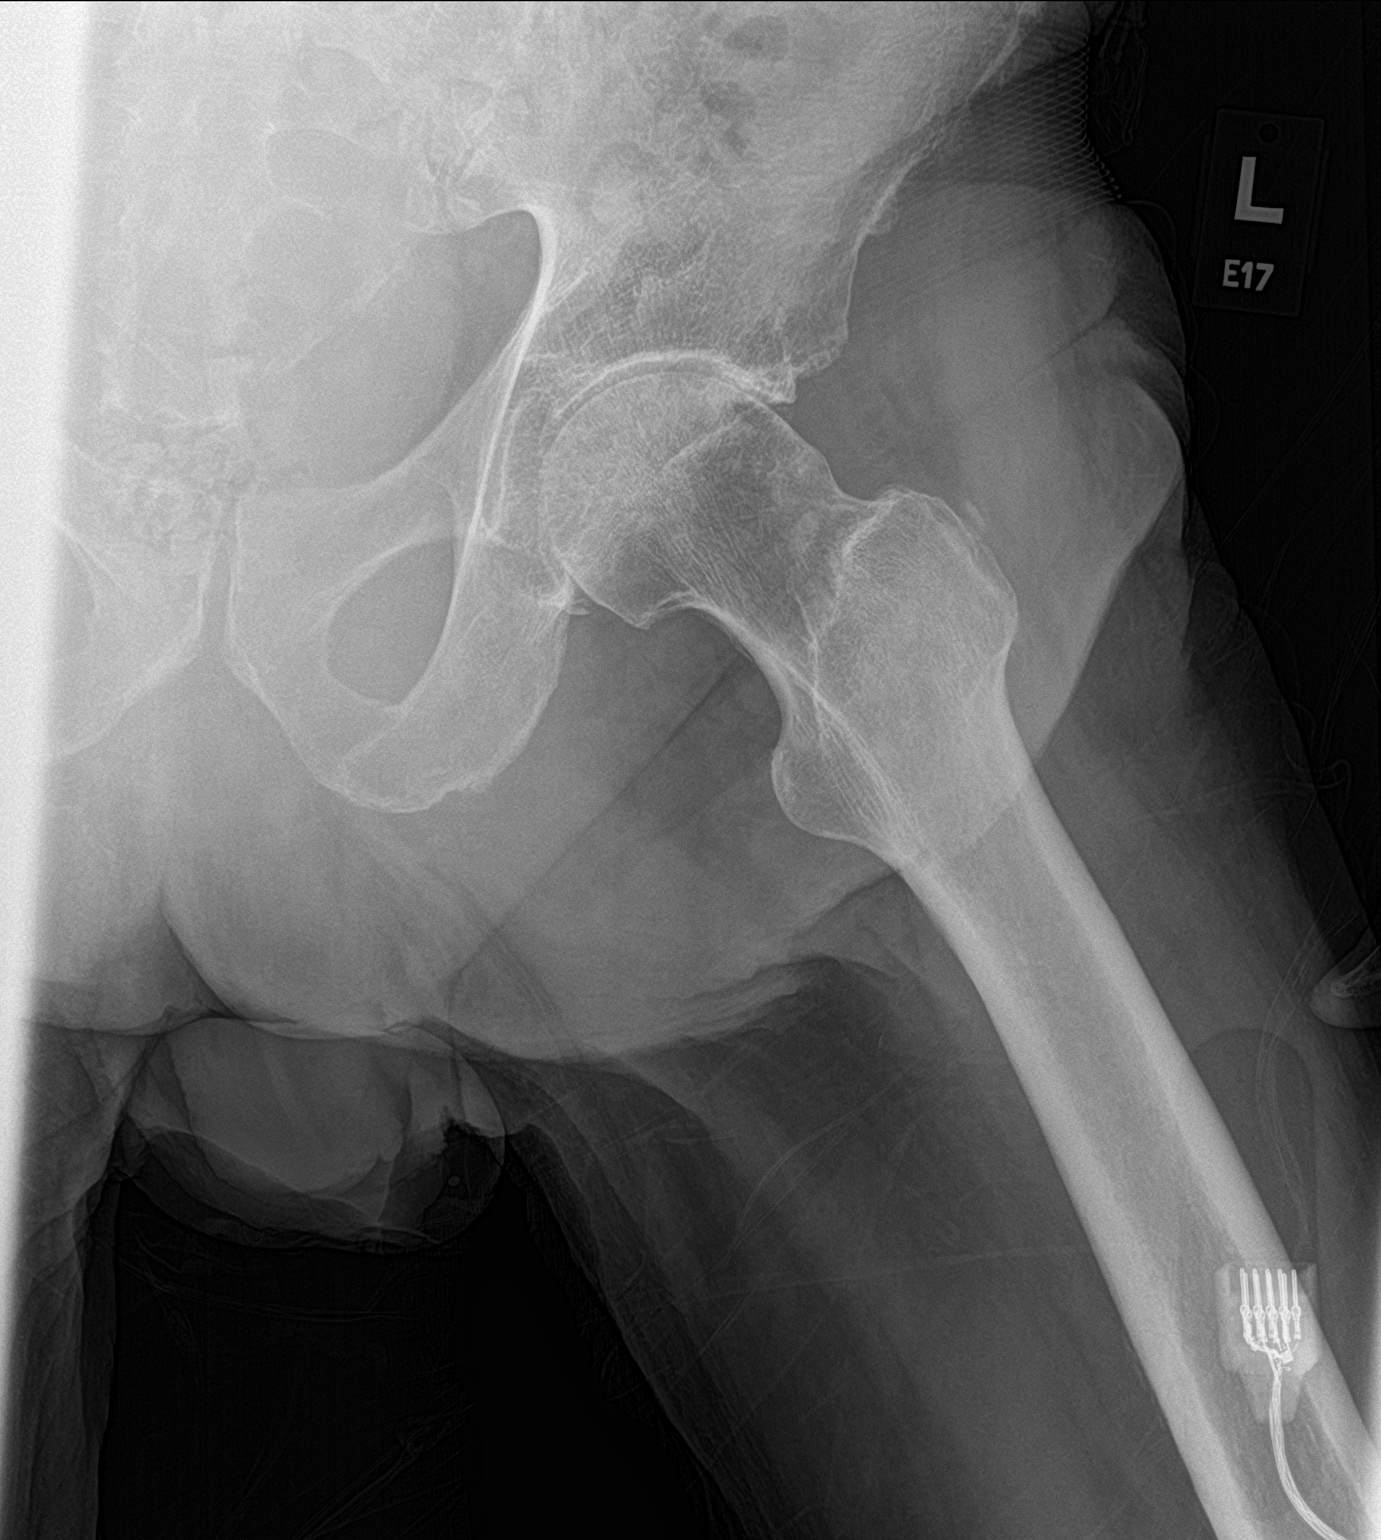

[3 of 3 positions shown; findings below may reference images not displayed]

FINDINGS: There is no evidence of hip fracture or dislocation. Mild narrowing
with osteophyte formation is noted.
IMPRESSION: Mild degenerative joint disease of the left hip. No acute
abnormality is noted.

## 2021-12-23 IMAGING — CR DG CHEST 1V
1 series · 1 of 1 positions shown · non-contrast
Comparison: June 08, 2015.

CLINICAL DATA: Unwitnessed fall.

EXAM:
CHEST  1 VIEW

[chest ap]
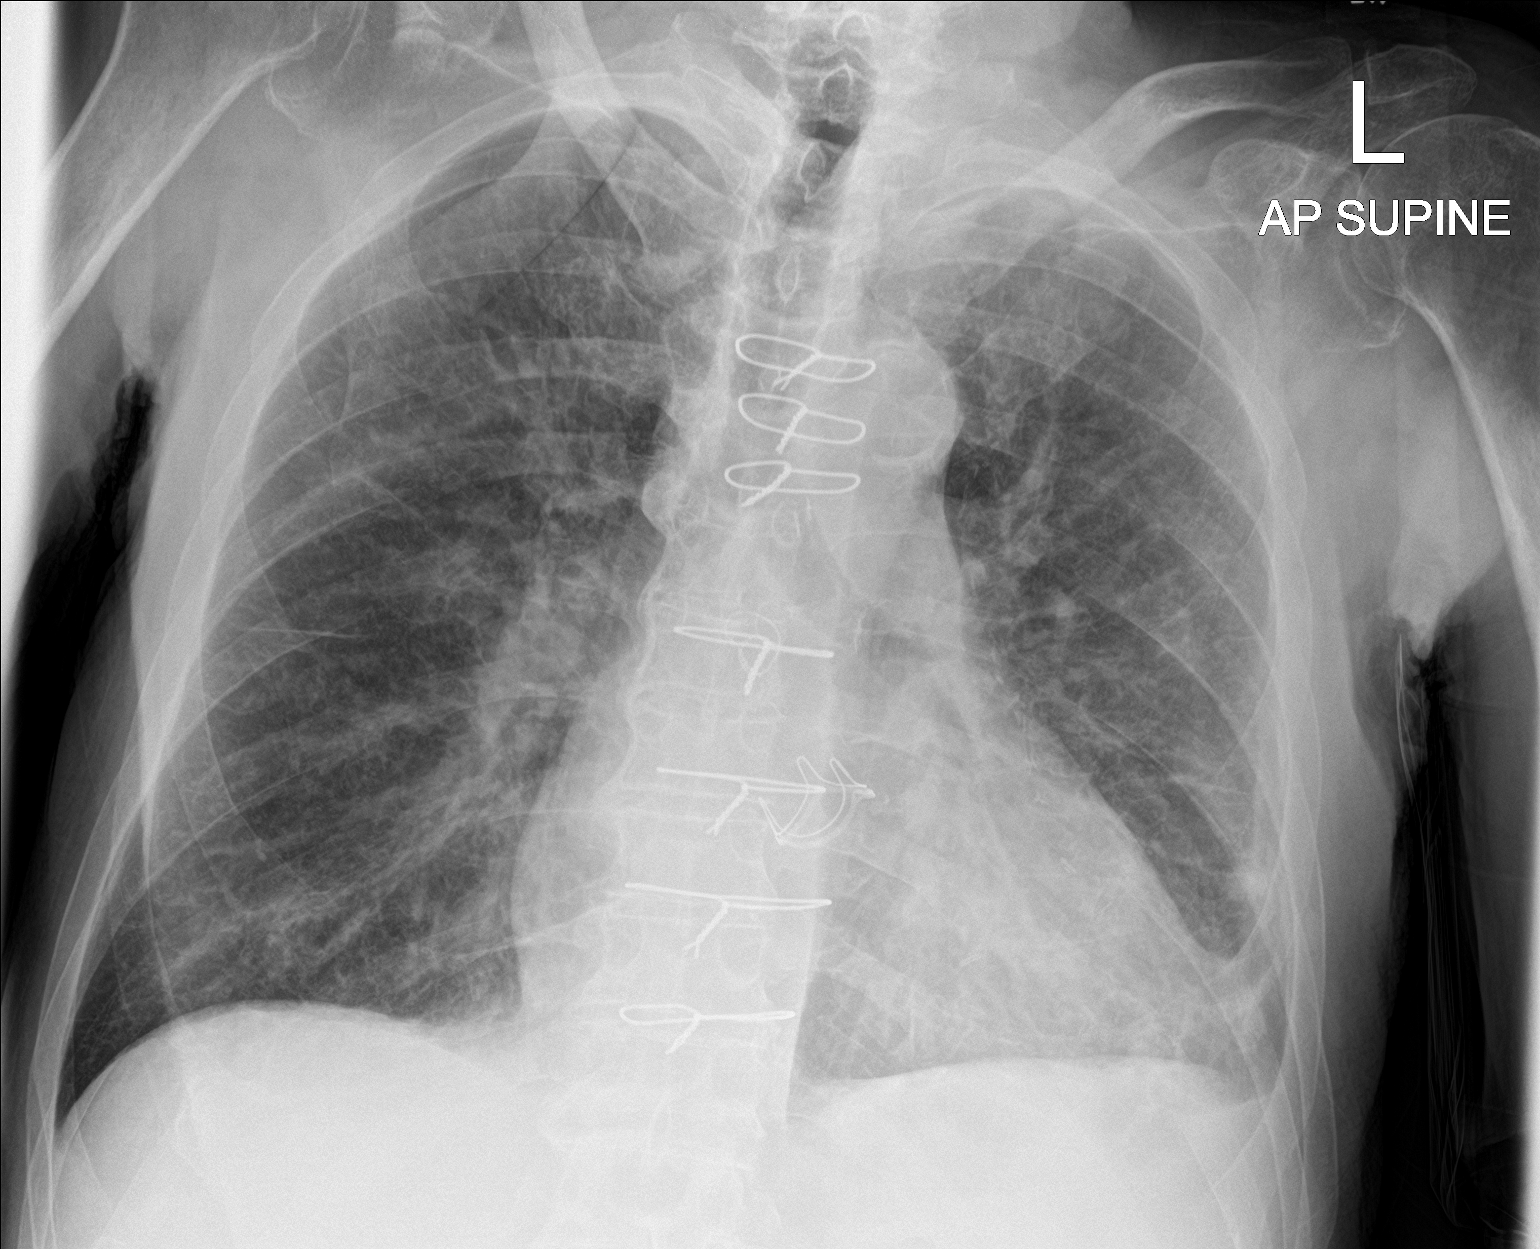

[1 of 1 positions shown; findings below may reference images not displayed]

FINDINGS: Stable cardiomegaly. Status post coronary bypass graft and aortic
valve repair. No pneumothorax is noted. Mild bibasilar subsegmental
atelectasis or scarring is noted. Loculated left pleural effusion on
prior exam is significantly smaller. Bony thorax is unremarkable.
IMPRESSION: Mild bibasilar subsegmental atelectasis or scarring. Loculated left
pleural effusion is significantly smaller compared to prior exam.

Aortic Atherosclerosis ([id number]).
